# Patient Record
Sex: Female | Born: 1960
Health system: Southern US, Community
[De-identification: ages and names within clinical notes are randomized; demographics above are authoritative.]

## PROBLEM LIST (undated history)

## (undated) DIAGNOSIS — D649 Anemia, unspecified: Secondary | ICD-10-CM

## (undated) DIAGNOSIS — K760 Fatty (change of) liver, not elsewhere classified: Secondary | ICD-10-CM

## (undated) DIAGNOSIS — M2550 Pain in unspecified joint: Secondary | ICD-10-CM

## (undated) DIAGNOSIS — Z9221 Personal history of antineoplastic chemotherapy: Secondary | ICD-10-CM

## (undated) DIAGNOSIS — Z1389 Encounter for screening for other disorder: Secondary | ICD-10-CM

## (undated) DIAGNOSIS — E039 Hypothyroidism, unspecified: Secondary | ICD-10-CM

## (undated) DIAGNOSIS — Z923 Personal history of irradiation: Secondary | ICD-10-CM

## (undated) DIAGNOSIS — N63 Unspecified lump in unspecified breast: Secondary | ICD-10-CM

## (undated) DIAGNOSIS — I4891 Unspecified atrial fibrillation: Secondary | ICD-10-CM

## (undated) DIAGNOSIS — E041 Nontoxic single thyroid nodule: Secondary | ICD-10-CM

## (undated) DIAGNOSIS — K469 Unspecified abdominal hernia without obstruction or gangrene: Secondary | ICD-10-CM

## (undated) DIAGNOSIS — L02219 Cutaneous abscess of trunk, unspecified: Secondary | ICD-10-CM

## (undated) DIAGNOSIS — I10 Essential (primary) hypertension: Secondary | ICD-10-CM

## (undated) DIAGNOSIS — C50419 Malignant neoplasm of upper-outer quadrant of unspecified female breast: Secondary | ICD-10-CM

## (undated) DIAGNOSIS — I5189 Other ill-defined heart diseases: Secondary | ICD-10-CM

## (undated) DIAGNOSIS — C50919 Malignant neoplasm of unspecified site of unspecified female breast: Secondary | ICD-10-CM

## (undated) DIAGNOSIS — R600 Localized edema: Secondary | ICD-10-CM

## (undated) DIAGNOSIS — D4819 Other specified neoplasm of uncertain behavior of connective and other soft tissue: Secondary | ICD-10-CM

## (undated) DIAGNOSIS — R079 Chest pain, unspecified: Secondary | ICD-10-CM

## (undated) DIAGNOSIS — K219 Gastro-esophageal reflux disease without esophagitis: Secondary | ICD-10-CM

## (undated) DIAGNOSIS — M549 Dorsalgia, unspecified: Secondary | ICD-10-CM

## (undated) DIAGNOSIS — C44319 Basal cell carcinoma of skin of other parts of face: Secondary | ICD-10-CM

## (undated) DIAGNOSIS — Z1239 Encounter for other screening for malignant neoplasm of breast: Secondary | ICD-10-CM

## (undated) DIAGNOSIS — R0602 Shortness of breath: Secondary | ICD-10-CM

## (undated) DIAGNOSIS — R7303 Prediabetes: Secondary | ICD-10-CM

## (undated) DIAGNOSIS — Z853 Personal history of malignant neoplasm of breast: Secondary | ICD-10-CM

## (undated) DIAGNOSIS — R002 Palpitations: Secondary | ICD-10-CM

## (undated) DIAGNOSIS — L03319 Cellulitis of trunk, unspecified: Secondary | ICD-10-CM

## (undated) DIAGNOSIS — D481 Neoplasm of uncertain behavior of connective and other soft tissue: Secondary | ICD-10-CM

## (undated) DIAGNOSIS — E669 Obesity, unspecified: Secondary | ICD-10-CM

## (undated) DIAGNOSIS — G4730 Sleep apnea, unspecified: Secondary | ICD-10-CM

## (undated) HISTORY — DX: Prediabetes: R73.03

## (undated) HISTORY — DX: Pain in unspecified joint: M25.50

## (undated) HISTORY — DX: Malignant neoplasm of upper-outer quadrant of unspecified female breast: C50.419

## (undated) HISTORY — DX: Cellulitis of trunk, unspecified: L03.319

## (undated) HISTORY — DX: Palpitations: R00.2

## (undated) HISTORY — DX: Chest pain, unspecified: R07.9

## (undated) HISTORY — DX: Other specified neoplasm of uncertain behavior of connective and other soft tissue: D48.19

## (undated) HISTORY — DX: Unspecified abdominal hernia without obstruction or gangrene: K46.9

## (undated) HISTORY — DX: Shortness of breath: R06.02

## (undated) HISTORY — DX: Encounter for other screening for malignant neoplasm of breast: Z12.39

## (undated) HISTORY — DX: Unspecified lump in unspecified breast: N63.0

## (undated) HISTORY — DX: Obesity, unspecified: E66.9

## (undated) HISTORY — DX: Other ill-defined heart diseases: I51.89

## (undated) HISTORY — DX: Cutaneous abscess of trunk, unspecified: L02.219

## (undated) HISTORY — DX: Personal history of malignant neoplasm of breast: Z85.3

## (undated) HISTORY — DX: Encounter for screening for other disorder: Z13.89

## (undated) HISTORY — DX: Nontoxic single thyroid nodule: E04.1

## (undated) HISTORY — DX: Gastro-esophageal reflux disease without esophagitis: K21.9

## (undated) HISTORY — DX: Hypothyroidism, unspecified: E03.9

## (undated) HISTORY — DX: Personal history of antineoplastic chemotherapy: Z92.21

## (undated) HISTORY — DX: Neoplasm of uncertain behavior of connective and other soft tissue: D48.1

## (undated) HISTORY — DX: Basal cell carcinoma of skin of other parts of face: C44.319

## (undated) HISTORY — DX: Malignant neoplasm of unspecified site of unspecified female breast: C50.919

## (undated) HISTORY — PX: BREAST CYST ASPIRATION: SHX578

## (undated) HISTORY — DX: Unspecified atrial fibrillation: I48.91

## (undated) HISTORY — DX: Dorsalgia, unspecified: M54.9

## (undated) HISTORY — DX: Anemia, unspecified: D64.9

## (undated) HISTORY — DX: Localized edema: R60.0

## (undated) HISTORY — PX: CARDIAC CATHETERIZATION: SHX172

## (undated) HISTORY — DX: Essential (primary) hypertension: I10

## (undated) HISTORY — PX: THYROID SURGERY: SHX805

## (undated) HISTORY — DX: Sleep apnea, unspecified: G47.30

## (undated) HISTORY — DX: Fatty (change of) liver, not elsewhere classified: K76.0

## (undated) HISTORY — PX: TUBAL LIGATION: SHX77

---

## 2004-09-09 ENCOUNTER — Ambulatory Visit: Payer: Self-pay | Admitting: General Practice

## 2005-11-19 ENCOUNTER — Ambulatory Visit: Payer: Self-pay | Admitting: Family Medicine

## 2008-11-16 ENCOUNTER — Ambulatory Visit: Payer: Self-pay | Admitting: Unknown Physician Specialty

## 2008-11-22 ENCOUNTER — Ambulatory Visit: Payer: Self-pay | Admitting: Unknown Physician Specialty

## 2009-02-23 HISTORY — PX: ABDOMINAL HYSTERECTOMY: SHX81

## 2009-12-12 ENCOUNTER — Ambulatory Visit: Payer: Self-pay | Admitting: Unknown Physician Specialty

## 2009-12-30 ENCOUNTER — Ambulatory Visit: Payer: Self-pay | Admitting: Unknown Physician Specialty

## 2010-02-23 DIAGNOSIS — Z9221 Personal history of antineoplastic chemotherapy: Secondary | ICD-10-CM

## 2010-02-23 HISTORY — PX: BREAST LUMPECTOMY: SHX2

## 2010-02-23 HISTORY — DX: Personal history of antineoplastic chemotherapy: Z92.21

## 2010-07-02 ENCOUNTER — Ambulatory Visit: Payer: Self-pay | Admitting: General Surgery

## 2010-08-05 HISTORY — PX: BREAST EXCISIONAL BIOPSY: SUR124

## 2010-08-15 ENCOUNTER — Other Ambulatory Visit: Payer: Self-pay | Admitting: General Surgery

## 2010-08-15 DIAGNOSIS — C50912 Malignant neoplasm of unspecified site of left female breast: Secondary | ICD-10-CM

## 2010-08-19 ENCOUNTER — Other Ambulatory Visit: Payer: Self-pay | Admitting: General Surgery

## 2010-08-20 ENCOUNTER — Ambulatory Visit: Payer: Self-pay | Admitting: General Surgery

## 2010-08-23 ENCOUNTER — Ambulatory Visit
Admission: RE | Admit: 2010-08-23 | Discharge: 2010-08-23 | Disposition: A | Discharge status: Home / Self Care | Payer: 59 | Source: Ambulatory Visit | Attending: General Surgery | Admitting: General Surgery

## 2010-08-23 DIAGNOSIS — C50912 Malignant neoplasm of unspecified site of left female breast: Secondary | ICD-10-CM

## 2010-08-23 MED ORDER — GADOBENATE DIMEGLUMINE 529 MG/ML IV SOLN
20.0000 mL | Freq: Once | INTRAVENOUS | Status: AC | PRN
  Administered 2010-08-23: 20 mL via INTRAVENOUS

## 2010-08-24 ENCOUNTER — Ambulatory Visit: Payer: Self-pay | Admitting: Internal Medicine

## 2010-09-02 DIAGNOSIS — Z853 Personal history of malignant neoplasm of breast: Secondary | ICD-10-CM | POA: Insufficient documentation

## 2010-09-03 ENCOUNTER — Ambulatory Visit: Payer: Self-pay | Admitting: General Surgery

## 2010-09-08 LAB — PATHOLOGY REPORT

## 2010-09-10 ENCOUNTER — Ambulatory Visit: Payer: Self-pay | Admitting: Internal Medicine

## 2010-09-19 ENCOUNTER — Ambulatory Visit: Payer: Self-pay | Admitting: Internal Medicine

## 2010-09-22 ENCOUNTER — Ambulatory Visit: Payer: Self-pay | Admitting: General Surgery

## 2010-09-24 ENCOUNTER — Ambulatory Visit: Payer: Self-pay | Admitting: Internal Medicine

## 2010-10-25 ENCOUNTER — Ambulatory Visit: Payer: Self-pay | Admitting: Internal Medicine

## 2010-11-24 ENCOUNTER — Ambulatory Visit: Payer: Self-pay | Admitting: Internal Medicine

## 2010-12-08 ENCOUNTER — Encounter: Payer: Self-pay | Admitting: Internal Medicine

## 2010-12-25 ENCOUNTER — Ambulatory Visit: Payer: Self-pay | Admitting: Internal Medicine

## 2010-12-25 ENCOUNTER — Encounter: Payer: Self-pay | Admitting: Internal Medicine

## 2011-01-24 ENCOUNTER — Ambulatory Visit: Payer: Self-pay | Admitting: Internal Medicine

## 2011-01-24 ENCOUNTER — Encounter: Payer: Self-pay | Admitting: Internal Medicine

## 2011-02-23 ENCOUNTER — Ambulatory Visit: Payer: Self-pay | Admitting: General Surgery

## 2011-02-24 ENCOUNTER — Ambulatory Visit: Payer: Self-pay | Admitting: Internal Medicine

## 2011-02-24 DIAGNOSIS — Z923 Personal history of irradiation: Secondary | ICD-10-CM

## 2011-02-24 HISTORY — DX: Personal history of irradiation: Z92.3

## 2011-03-06 LAB — CBC CANCER CENTER
Basophil #: 0 x10 3/mm (ref 0.0–0.1)
Basophil %: 0.5 %
Eosinophil #: 0.2 x10 3/mm (ref 0.0–0.7)
Lymphocyte %: 17.9 %
MCH: 31.4 pg (ref 26.0–34.0)
MCHC: 34.2 g/dL (ref 32.0–36.0)
Monocyte #: 0.5 x10 3/mm (ref 0.0–0.7)
Monocyte %: 7.6 %
Neutrophil #: 5.1 x10 3/mm (ref 1.4–6.5)
Neutrophil %: 71.8 %
Platelet: 260 x10 3/mm (ref 150–440)
RDW: 15.4 % — ABNORMAL HIGH (ref 11.5–14.5)
WBC: 7.1 x10 3/mm (ref 3.6–11.0)

## 2011-03-13 LAB — CBC CANCER CENTER
Basophil #: 0 x10 3/mm (ref 0.0–0.1)
Eosinophil #: 0.1 x10 3/mm (ref 0.0–0.7)
HCT: 35.1 % (ref 35.0–47.0)
Lymphocyte #: 1.1 x10 3/mm (ref 1.0–3.6)
MCHC: 34.1 g/dL (ref 32.0–36.0)
MCV: 92 fL (ref 80–100)
Monocyte %: 7.9 %
Neutrophil #: 4.8 x10 3/mm (ref 1.4–6.5)
Platelet: 281 x10 3/mm (ref 150–440)
RDW: 14.7 % — ABNORMAL HIGH (ref 11.5–14.5)
WBC: 6.5 x10 3/mm (ref 3.6–11.0)

## 2011-03-27 ENCOUNTER — Ambulatory Visit: Payer: Self-pay | Admitting: Internal Medicine

## 2011-03-27 LAB — CBC CANCER CENTER
Basophil #: 0 x10 3/mm (ref 0.0–0.1)
Basophil %: 0.3 %
Eosinophil #: 0.1 x10 3/mm (ref 0.0–0.7)
Eosinophil %: 1.9 %
HGB: 11.6 g/dL — ABNORMAL LOW (ref 12.0–16.0)
Lymphocyte #: 0.7 x10 3/mm — ABNORMAL LOW (ref 1.0–3.6)
MCH: 31 pg (ref 26.0–34.0)
MCHC: 34.2 g/dL (ref 32.0–36.0)
Monocyte #: 0.5 x10 3/mm (ref 0.0–0.7)
Neutrophil %: 75.6 %
Platelet: 224 x10 3/mm (ref 150–440)

## 2011-04-05 ENCOUNTER — Emergency Department: Payer: Self-pay | Admitting: Emergency Medicine

## 2011-04-05 LAB — COMPREHENSIVE METABOLIC PANEL
Albumin: 3.3 g/dL — ABNORMAL LOW (ref 3.4–5.0)
Anion Gap: 10 (ref 7–16)
Bilirubin,Total: 0.6 mg/dL (ref 0.2–1.0)
Calcium, Total: 8.7 mg/dL (ref 8.5–10.1)
Glucose: 132 mg/dL — ABNORMAL HIGH (ref 65–99)
Osmolality: 278 (ref 275–301)
Potassium: 2.7 mmol/L — ABNORMAL LOW (ref 3.5–5.1)
SGPT (ALT): 29 U/L
Sodium: 138 mmol/L (ref 136–145)
Total Protein: 7.3 g/dL (ref 6.4–8.2)

## 2011-04-05 LAB — URINALYSIS, COMPLETE
Bilirubin,UR: NEGATIVE
Glucose,UR: NEGATIVE mg/dL (ref 0–75)
Leukocyte Esterase: NEGATIVE
Nitrite: NEGATIVE
Ph: 6 (ref 4.5–8.0)
RBC,UR: 8 /HPF (ref 0–5)

## 2011-04-05 LAB — CBC
MCH: 30.6 pg (ref 26.0–34.0)
MCV: 89 fL (ref 80–100)
Platelet: 91 10*3/uL — ABNORMAL LOW (ref 150–440)
RDW: 14 % (ref 11.5–14.5)
WBC: 9 10*3/uL (ref 3.6–11.0)

## 2011-04-06 LAB — URINE CULTURE

## 2011-04-10 LAB — CBC CANCER CENTER
Basophil #: 0 x10 3/mm (ref 0.0–0.1)
Basophil %: 0.3 %
Eosinophil #: 0 x10 3/mm (ref 0.0–0.7)
Eosinophil %: 0.8 %
HGB: 11 g/dL — ABNORMAL LOW (ref 12.0–16.0)
Lymphocyte #: 0.7 x10 3/mm — ABNORMAL LOW (ref 1.0–3.6)
MCH: 30 pg (ref 26.0–34.0)
MCHC: 34.2 g/dL (ref 32.0–36.0)
MCV: 88 fL (ref 80–100)
Monocyte #: 0.7 x10 3/mm (ref 0.0–0.7)
Neutrophil #: 4 x10 3/mm (ref 1.4–6.5)
Neutrophil %: 72.1 %
Platelet: 184 x10 3/mm (ref 150–440)

## 2011-04-17 LAB — CBC CANCER CENTER
Basophil #: 0 x10 3/mm (ref 0.0–0.1)
Basophil %: 0.3 %
Eosinophil #: 0.1 x10 3/mm (ref 0.0–0.7)
HCT: 31.8 % — ABNORMAL LOW (ref 35.0–47.0)
MCH: 30 pg (ref 26.0–34.0)
MCV: 88 fL (ref 80–100)
Monocyte #: 0.6 x10 3/mm (ref 0.0–0.7)
Monocyte %: 10.8 %
Platelet: 283 x10 3/mm (ref 150–440)
RDW: 15.1 % — ABNORMAL HIGH (ref 11.5–14.5)
WBC: 5.3 x10 3/mm (ref 3.6–11.0)

## 2011-04-24 ENCOUNTER — Ambulatory Visit: Payer: Self-pay | Admitting: Internal Medicine

## 2011-05-25 ENCOUNTER — Ambulatory Visit: Payer: Self-pay | Admitting: Internal Medicine

## 2011-05-29 LAB — CBC CANCER CENTER
Basophil #: 0 x10 3/mm (ref 0.0–0.1)
Basophil %: 0.4 %
Eosinophil #: 0.1 x10 3/mm (ref 0.0–0.7)
Eosinophil %: 1.4 %
HCT: 35.9 % (ref 35.0–47.0)
Lymphocyte %: 22.4 %
MCHC: 34.3 g/dL (ref 32.0–36.0)
MCV: 86 fL (ref 80–100)
Monocyte #: 0.4 x10 3/mm (ref 0.0–0.7)
Monocyte %: 7.2 %
Neutrophil #: 4.1 x10 3/mm (ref 1.4–6.5)
Neutrophil %: 68.6 %
RBC: 4.17 10*6/uL (ref 3.80–5.20)
RDW: 14.3 % (ref 11.5–14.5)

## 2011-05-29 LAB — COMPREHENSIVE METABOLIC PANEL
Alkaline Phosphatase: 89 U/L (ref 50–136)
Anion Gap: 11 (ref 7–16)
BUN: 15 mg/dL (ref 7–18)
Bilirubin,Total: 0.3 mg/dL (ref 0.2–1.0)
Calcium, Total: 9 mg/dL (ref 8.5–10.1)
Creatinine: 1.23 mg/dL (ref 0.60–1.30)
EGFR (Non-African Amer.): 49 — ABNORMAL LOW
Glucose: 126 mg/dL — ABNORMAL HIGH (ref 65–99)
Osmolality: 282 (ref 275–301)
Potassium: 3.3 mmol/L — ABNORMAL LOW (ref 3.5–5.1)
SGOT(AST): 30 U/L (ref 15–37)
SGPT (ALT): 57 U/L
Total Protein: 7.8 g/dL (ref 6.4–8.2)

## 2011-06-19 LAB — CBC CANCER CENTER
Basophil #: 0 x10 3/mm (ref 0.0–0.1)
Basophil %: 0.5 %
HGB: 11.7 g/dL — ABNORMAL LOW (ref 12.0–16.0)
Lymphocyte #: 1.4 x10 3/mm (ref 1.0–3.6)
Lymphocyte %: 22.2 %
MCH: 28.2 pg (ref 26.0–34.0)
MCHC: 32.8 g/dL (ref 32.0–36.0)
Monocyte #: 0.6 x10 3/mm (ref 0.2–0.9)
Neutrophil #: 4.3 x10 3/mm (ref 1.4–6.5)
Neutrophil %: 66.6 %

## 2011-06-24 ENCOUNTER — Ambulatory Visit: Payer: Self-pay | Admitting: Internal Medicine

## 2011-07-10 LAB — CBC CANCER CENTER
Basophil #: 0 x10 3/mm (ref 0.0–0.1)
Basophil %: 0.5 %
Eosinophil #: 0.1 x10 3/mm (ref 0.0–0.7)
HCT: 35.6 % (ref 35.0–47.0)
Lymphocyte #: 1.2 x10 3/mm (ref 1.0–3.6)
MCHC: 32.8 g/dL (ref 32.0–36.0)
MCV: 86 fL (ref 80–100)
Monocyte #: 0.5 x10 3/mm (ref 0.2–0.9)
Monocyte %: 7.1 %
Neutrophil #: 4.8 x10 3/mm (ref 1.4–6.5)
Platelet: 225 x10 3/mm (ref 150–440)
WBC: 6.7 x10 3/mm (ref 3.6–11.0)

## 2011-07-25 ENCOUNTER — Ambulatory Visit: Payer: Self-pay | Admitting: Internal Medicine

## 2011-07-31 LAB — CBC CANCER CENTER
Basophil #: 0 10*3/uL
Basophil %: 0.6 %
Eosinophil #: 0.2 10*3/uL
Eosinophil %: 2.4 %
HCT: 35.9 %
HGB: 11.7 g/dL — ABNORMAL LOW
Lymphocyte %: 24.4 %
Lymphs Abs: 1.8 10*3/uL
MCH: 28 pg
MCHC: 32.6 g/dL
MCV: 86 fL
Monocyte #: 0.5 10*3/uL
Monocyte %: 7.6 %
Neutrophil #: 4.7 10*3/uL
Neutrophil %: 65 %
Platelet: 241 10*3/uL
RBC: 4.18 10*6/uL
RDW: 14.1 %
WBC: 7.3 10*3/uL

## 2011-07-31 LAB — COMPREHENSIVE METABOLIC PANEL WITH GFR
Albumin: 3.6 g/dL
Alkaline Phosphatase: 102 U/L
Anion Gap: 8
BUN: 16 mg/dL
Bilirubin,Total: 0.2 mg/dL
Calcium, Total: 9.1 mg/dL
Chloride: 103 mmol/L
Co2: 29 mmol/L
Creatinine: 1.04 mg/dL
EGFR (African American): >60
EGFR (Non-African Amer.): >60
Glucose: 86 mg/dL
Osmolality: 280
Potassium: 3.6 mmol/L
SGOT(AST): 16 U/L
SGPT (ALT): 22 U/L
Sodium: 140 mmol/L
Total Protein: 7.6 g/dL

## 2011-08-21 LAB — CBC CANCER CENTER
Basophil %: 0.6 %
Eosinophil #: 0.2 x10 3/mm (ref 0.0–0.7)
Eosinophil %: 2.3 %
Lymphocyte #: 1.6 x10 3/mm (ref 1.0–3.6)
MCH: 28.1 pg (ref 26.0–34.0)
MCHC: 32.7 g/dL (ref 32.0–36.0)
Monocyte #: 0.4 x10 3/mm (ref 0.2–0.9)
Neutrophil #: 4.9 x10 3/mm (ref 1.4–6.5)
Neutrophil %: 68.6 %
Platelet: 235 x10 3/mm (ref 150–440)
RBC: 4.19 10*6/uL (ref 3.80–5.20)
RDW: 13.9 % (ref 11.5–14.5)
WBC: 7.1 x10 3/mm (ref 3.6–11.0)

## 2011-08-24 ENCOUNTER — Ambulatory Visit: Payer: Self-pay | Admitting: Internal Medicine

## 2011-09-08 ENCOUNTER — Ambulatory Visit: Payer: Self-pay | Admitting: General Surgery

## 2011-09-11 LAB — CBC CANCER CENTER
Basophil #: 0 x10 3/mm (ref 0.0–0.1)
Eosinophil #: 0.1 x10 3/mm (ref 0.0–0.7)
HCT: 36.8 % (ref 35.0–47.0)
Lymphocyte #: 1.9 x10 3/mm (ref 1.0–3.6)
MCV: 87 fL (ref 80–100)
Monocyte #: 0.5 x10 3/mm (ref 0.2–0.9)
Monocyte %: 7.2 %
Neutrophil %: 65.6 %
Platelet: 226 x10 3/mm (ref 150–440)
RDW: 14.3 % (ref 11.5–14.5)
WBC: 7.4 x10 3/mm (ref 3.6–11.0)

## 2011-09-24 ENCOUNTER — Ambulatory Visit: Payer: Self-pay | Admitting: Internal Medicine

## 2011-10-02 LAB — CBC CANCER CENTER
Basophil #: 0 x10 3/mm (ref 0.0–0.1)
Basophil %: 0.5 %
Eosinophil #: 0.1 x10 3/mm (ref 0.0–0.7)
HGB: 12.3 g/dL (ref 12.0–16.0)
Lymphocyte %: 27.5 %
MCH: 29 pg (ref 26.0–34.0)
MCV: 86 fL (ref 80–100)
Monocyte #: 0.3 x10 3/mm (ref 0.2–0.9)
Neutrophil #: 4.7 x10 3/mm (ref 1.4–6.5)
Platelet: 247 x10 3/mm (ref 150–440)
RBC: 4.24 10*6/uL (ref 3.80–5.20)

## 2011-10-23 ENCOUNTER — Ambulatory Visit: Payer: Self-pay | Admitting: General Surgery

## 2011-10-23 LAB — CBC CANCER CENTER
Basophil %: 0.4 %
Eosinophil #: 0.1 x10 3/mm (ref 0.0–0.7)
Eosinophil %: 1.7 %
HCT: 34.7 % — ABNORMAL LOW (ref 35.0–47.0)
HGB: 11.3 g/dL — ABNORMAL LOW (ref 12.0–16.0)
Lymphocyte %: 27.5 %
MCH: 28 pg (ref 26.0–34.0)
MCHC: 32.5 g/dL (ref 32.0–36.0)
Monocyte #: 0.6 x10 3/mm (ref 0.2–0.9)
Monocyte %: 7.6 %
Neutrophil #: 4.6 x10 3/mm (ref 1.4–6.5)
Neutrophil %: 62.8 %
RBC: 4.03 10*6/uL (ref 3.80–5.20)

## 2011-10-23 LAB — COMPREHENSIVE METABOLIC PANEL
Albumin: 3.5 g/dL (ref 3.4–5.0)
BUN: 18 mg/dL (ref 7–18)
Calcium, Total: 8.9 mg/dL (ref 8.5–10.1)
Chloride: 105 mmol/L (ref 98–107)
Co2: 30 mmol/L (ref 21–32)
EGFR (African American): >60
EGFR (Non-African Amer.): 56 — ABNORMAL LOW
SGOT(AST): 22 U/L (ref 15–37)
SGPT (ALT): 23 U/L (ref 12–78)
Sodium: 143 mmol/L (ref 136–145)

## 2011-10-25 ENCOUNTER — Ambulatory Visit: Payer: Self-pay | Admitting: Internal Medicine

## 2011-11-13 LAB — CBC CANCER CENTER
Basophil %: 0.8 %
Eosinophil %: 1 %
HCT: 36.7 % (ref 35.0–47.0)
HGB: 12.1 g/dL (ref 12.0–16.0)
Lymphocyte #: 2.1 x10 3/mm (ref 1.0–3.6)
MCH: 28.4 pg (ref 26.0–34.0)
MCHC: 33 g/dL (ref 32.0–36.0)
Monocyte %: 6.9 %
Neutrophil #: 4.6 x10 3/mm (ref 1.4–6.5)
Neutrophil %: 62.5 %
WBC: 7.4 x10 3/mm (ref 3.6–11.0)

## 2011-11-24 ENCOUNTER — Ambulatory Visit: Payer: Self-pay | Admitting: Internal Medicine

## 2011-12-11 LAB — CREATININE, SERUM
Creatinine: 1.09 mg/dL (ref 0.60–1.30)
EGFR (African American): >60
EGFR (Non-African Amer.): 60 — ABNORMAL LOW

## 2011-12-11 LAB — CBC CANCER CENTER
Basophil %: 0.7 %
Eosinophil #: 0.1 x10 3/mm (ref 0.0–0.7)
Eosinophil %: 1.1 %
HCT: 38.2 % (ref 35.0–47.0)
HGB: 12.2 g/dL (ref 12.0–16.0)
Lymphocyte #: 2.3 x10 3/mm (ref 1.0–3.6)
Lymphocyte %: 30.7 %
MCHC: 32 g/dL (ref 32.0–36.0)
MCV: 87 fL (ref 80–100)
Monocyte %: 6.7 %
Neutrophil #: 4.5 x10 3/mm (ref 1.4–6.5)
Neutrophil %: 60.8 %
RBC: 4.4 10*6/uL (ref 3.80–5.20)
RDW: 14.2 % (ref 11.5–14.5)

## 2011-12-11 LAB — HEPATIC FUNCTION PANEL A (ARMC)
Bilirubin, Direct: <0.05 mg/dL (ref 0.00–0.20)
SGOT(AST): 18 U/L (ref 15–37)

## 2011-12-25 ENCOUNTER — Ambulatory Visit: Payer: Self-pay | Admitting: Internal Medicine

## 2012-01-29 ENCOUNTER — Ambulatory Visit: Payer: Self-pay | Admitting: Internal Medicine

## 2012-02-22 ENCOUNTER — Emergency Department: Payer: Self-pay | Admitting: Emergency Medicine

## 2012-02-22 LAB — CBC
HCT: 35.4 % (ref 35.0–47.0)
MCH: 30 pg (ref 26.0–34.0)
MCV: 86 fL (ref 80–100)
RBC: 4.1 10*6/uL (ref 3.80–5.20)
RDW: 14.5 % (ref 11.5–14.5)
WBC: 9 10*3/uL (ref 3.6–11.0)

## 2012-02-22 LAB — HEPATIC FUNCTION PANEL A (ARMC)
Albumin: 3.8 g/dL (ref 3.4–5.0)
Bilirubin, Direct: 0.1 mg/dL (ref 0.00–0.20)
SGOT(AST): 26 U/L (ref 15–37)
Total Protein: 7.9 g/dL (ref 6.4–8.2)

## 2012-02-22 LAB — BASIC METABOLIC PANEL
Anion Gap: 9 (ref 7–16)
BUN: 13 mg/dL (ref 7–18)
Chloride: 101 mmol/L (ref 98–107)
Co2: 27 mmol/L (ref 21–32)
Creatinine: 1.15 mg/dL (ref 0.60–1.30)
EGFR (African American): >60
Glucose: 102 mg/dL — ABNORMAL HIGH (ref 65–99)
Osmolality: 274 (ref 275–301)

## 2012-02-22 LAB — CK TOTAL AND CKMB (NOT AT ARMC)
CK, Total: 236 U/L — ABNORMAL HIGH (ref 21–215)
CK-MB: 0.6 ng/mL (ref 0.5–3.6)

## 2012-02-22 LAB — MAGNESIUM: Magnesium: 1.9 mg/dL

## 2012-02-22 LAB — LIPASE, BLOOD: Lipase: 203 U/L (ref 73–393)

## 2012-02-24 ENCOUNTER — Ambulatory Visit: Payer: Self-pay | Admitting: Internal Medicine

## 2012-03-14 LAB — CBC CANCER CENTER
Basophil #: 0.1 x10 3/mm (ref 0.0–0.1)
Basophil %: 0.6 %
Eosinophil #: 0.1 x10 3/mm (ref 0.0–0.7)
Eosinophil %: 1.7 %
HCT: 38 % (ref 35.0–47.0)
HGB: 12.8 g/dL (ref 12.0–16.0)
Lymphocyte #: 2.8 x10 3/mm (ref 1.0–3.6)
Lymphocyte %: 31.2 %
Monocyte #: 0.4 x10 3/mm (ref 0.2–0.9)
Neutrophil #: 5.4 x10 3/mm (ref 1.4–6.5)
Neutrophil %: 61.5 %
RDW: 14.5 % (ref 11.5–14.5)

## 2012-03-14 LAB — HEPATIC FUNCTION PANEL A (ARMC)
Albumin: 3.7 g/dL (ref 3.4–5.0)
Alkaline Phosphatase: 99 U/L (ref 50–136)
Bilirubin, Direct: <0.05 mg/dL (ref 0.00–0.20)
SGOT(AST): 18 U/L (ref 15–37)
Total Protein: 8.2 g/dL (ref 6.4–8.2)

## 2012-03-14 LAB — CREATININE, SERUM
Creatinine: 1.16 mg/dL (ref 0.60–1.30)
EGFR (Non-African Amer.): 55 — ABNORMAL LOW

## 2012-03-14 LAB — MAGNESIUM: Magnesium: 1.8 mg/dL

## 2012-03-26 ENCOUNTER — Ambulatory Visit: Payer: Self-pay | Admitting: Internal Medicine

## 2012-04-01 ENCOUNTER — Encounter: Payer: Self-pay | Admitting: General Surgery

## 2012-05-05 ENCOUNTER — Ambulatory Visit: Payer: Self-pay | Admitting: General Surgery

## 2012-05-05 ENCOUNTER — Ambulatory Visit: Payer: Self-pay | Admitting: Internal Medicine

## 2012-05-18 ENCOUNTER — Telehealth: Payer: Self-pay | Admitting: *Deleted

## 2012-05-18 DIAGNOSIS — Z853 Personal history of malignant neoplasm of breast: Secondary | ICD-10-CM

## 2012-05-18 NOTE — Telephone Encounter (Signed)
Patient has been scheduled per her request for a unilateral right breast diagnostic mammogram at [facility] for 05-23-12 at 9 am. She is aware of date, time, and instructions.

## 2012-05-18 NOTE — Telephone Encounter (Addendum)
Patient just had unilateral left breast diagnostic mammogram. She is very nervous and is wanting to know if you would also order a mammogram on the right breast even though mammogram on 10-23-11 at [facility] showed no change. Patient has an appointment for follow up on 06-01-12. She is requesting that right mammogram be completed prior to this appointment so you can have the results at time of follow up. Please advise.    Arrange for a right diagnostic mammogram prior to 4/9 appt. Thanks.

## 2012-05-23 ENCOUNTER — Encounter: Payer: Self-pay | Admitting: General Surgery

## 2012-05-23 ENCOUNTER — Ambulatory Visit: Payer: Self-pay | Admitting: General Surgery

## 2012-05-24 ENCOUNTER — Ambulatory Visit: Payer: Self-pay | Admitting: Internal Medicine

## 2012-06-01 ENCOUNTER — Ambulatory Visit (INDEPENDENT_AMBULATORY_CARE_PROVIDER_SITE_OTHER): Payer: 59 | Admitting: General Surgery

## 2012-06-01 ENCOUNTER — Encounter: Payer: Self-pay | Admitting: General Surgery

## 2012-06-01 VITALS — BP 124/76 | HR 76 | Resp 14 | Ht 67.0 in | Wt 224.0 lb

## 2012-06-01 DIAGNOSIS — Z853 Personal history of malignant neoplasm of breast: Secondary | ICD-10-CM

## 2012-06-01 DIAGNOSIS — Z1211 Encounter for screening for malignant neoplasm of colon: Secondary | ICD-10-CM

## 2012-06-01 DIAGNOSIS — C50919 Malignant neoplasm of unspecified site of unspecified female breast: Secondary | ICD-10-CM

## 2012-06-01 DIAGNOSIS — C50912 Malignant neoplasm of unspecified site of left female breast: Secondary | ICD-10-CM

## 2012-06-01 MED ORDER — POLYETHYLENE GLYCOL 3350 17 GM/SCOOP PO POWD: ORAL | Status: DC

## 2012-06-01 NOTE — Patient Instructions (Addendum)
Patient advised to have left breast mammogram in 6 months.   Patient advised to have screening colonoscopy done. This patient will be contacted once June 2014 schedule is available. Miralax prescription has already been sent to pharmacy.

## 2012-06-01 NOTE — Progress Notes (Signed)
Patient ID: Heather Dudley, female   DOB: 12-24-1960, 52 y.o.   MRN: [id number]  Chief Complaint  Patient presents with  . Follow-up    mammogram    HPI Heather Dudley is a 52 y.o. female who presents for a follow up mammogram. Most recent mammogram was done at [facility] on 05/23/12 with birad category 2. The patient has a history of left breast cancer in 2012. She underwent a left lumpectomy, chemotherapy as well as radiation therapy in 2012. The patient states no problems at this time. No lumps,bumps,pain, or tenderness. She has has a bump to come up within the last 2 months on her left hip. She is not having any current signs or symptoms at this time.  HPI  Past Medical History  Diagnosis Date  . Cellulitis and abscess of trunk   . Hypertension   . 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 Thyroid nodule   . Hernia   . Personal history of malignant neoplasm of breast     The patient underwent wide excision, mastoplasty. Dissection for a T1b, N1, M0 carcinoma left breast on September 03, 2010.The primary tumor was 1 cm in diameter, and a single positive axillary node 1.3 cm in diameter. This was an ER-positive, PR slightly positive, HER-2/neu not overexpressing tumor.  The patient completed whole breast radiation in February 2013.  . Breast screening, unspecified   . Lump or mass in breast   . Obesity, unspecified   . History of chemotherapy 2012  . Screening for obesity   . Malignant neoplasm of breast (female), unspecified site     She underwent wide excision, mastoplasty and axillary dissection for her left breast malignancy on September 03, 2010. The primary tumor was 1 cm in diameter, and a single positive axillary node 1.3 cm in diameter. This was an ER-positive, PR slightly positive, HER-2/neu not overexpressing tumor. She tolerated her whole breast radiation without difficulty ending in late February 2013.  [name] Basal cell carcinoma of forehead     birthmark of  forehead  . Malignant neoplasm of upper-outer quadrant of female breast     Past Surgical History  Procedure Laterality Date  . Tubal ligation    . Tonsillectomy    . Birth mark removed    . Breast lumpectomy  2012    left breast  . Breast Dudley  August 05, 2010    left breat Heather Dudley  . Abdominal hysterectomy  2011  . Thyroid surgery      partially removed    Family History  Problem Relation Age of Onset  . Breast cancer Other   . Colon cancer Other   . Ovarian cancer Other     Social History History  Substance Use Topics  . Smoking status: Never Smoker   . Smokeless tobacco: Not on file  . Alcohol Use: No    Allergies  Allergen Reactions  . Penicillins Rash    Current Outpatient Prescriptions  Medication Sig Dispense Refill  . anastrozole (ARIMIDEX) 1 MG tablet Take 1 mg by mouth daily.      . Calcium Carbonate-Vitamin D (CALCIUM PLUS VITAMIN D PO) Take 2 tablets by mouth daily.      . Ibuprofen (ADVIL PO) Take by mouth.      [name] lisinopril-hydrochlorothiazide (PRINZIDE,ZESTORETIC) 10-12.5 MG per tablet Take 1 tablet by mouth daily.      . Multiple Vitamins-Minerals (MULTIVITAMIN PO) Take by mouth daily.      [name]  venlafaxine (EFFEXOR) 75 MG tablet Take 1 tablet by mouth daily.      . polyethylene glycol powder (GLYCOLAX/MIRALAX) powder 255 grams one bottle for colonoscopy prep  255 g  0   No current facility-administered medications for this visit.    Review of Systems Review of Systems  Constitutional: Negative.   Respiratory: Negative.   Cardiovascular: Negative.     Blood pressure 124/76, pulse 76, resp. rate 14, height 5\' 7"  (1.702 m), weight 224 lb (101.606 kg), last menstrual period 04/01/2008.  Physical Exam Physical Exam  Constitutional: She appears well-developed and well-nourished.  Neck: Trachea normal. No mass and no thyromegaly present.  Cardiovascular: Normal rate, regular rhythm, normal heart sounds and normal pulses.   No murmur  heard. Pulmonary/Chest: Effort normal and breath sounds normal. Right breast exhibits no inverted nipple, no mass, no nipple discharge, no skin change and no tenderness. Left breast exhibits skin change. Left breast exhibits no inverted nipple, no mass, no nipple discharge and no tenderness.  Little thickening in the right axillary tail. Port site well.    Well healed scar at 3 o'clock. Little thickening mid portion 3 o'clock.  Increased pigmentation.  Lymphadenopathy:    She has no cervical adenopathy.    She has no axillary adenopathy.   Examination of the upper extremities showed no visual asymmetry. Measurements of the upper extremities were completed a location 15 cm superior the olecranon process as well as 10 and 20 cm below the olecranon process.  On the right side measurements were 42, 34 and 23 cm respectively.  On the left side measurements were 42, 33 and 23 cm respectively.  No evidence of significant lymphedema.   Data Reviewed Right breast mammogram dated 05/23/2012 showed scattered fibroglandular densities. No dominant mass.BIRAD-2.  Left breast mammogram dated 05/05/2012 completed 21 months after wide excision and subsequent adjuvant chemotherapy and radiation showed a heterogeneously dense pattern. Skin thickening is noted inferiorly. No dominant mass. No new areas of architectural distortion. BIRAD-3.  Assessment    Doing well now almost 2 years status post treatment of her left breast cancer.  At age for screening colonoscopy.    Plan      The pros and cons of screening colonoscopy were discussed with the patient.  In regards to her breass we'll plans for a followup left breast mammogram and office visit in 6 months.       [name] 06/02/2012, 4:13 PM

## 2012-06-02 ENCOUNTER — Encounter: Payer: Self-pay | Admitting: General Surgery

## 2012-06-08 ENCOUNTER — Telehealth: Payer: Self-pay | Admitting: *Deleted

## 2012-06-08 NOTE — Telephone Encounter (Signed)
Patient was contacted today to arrange colonoscopy. This has been scheduled at [facility] for 08-03-12. She already has prescription for Miralax prep. Patient was instructed to pre-register no later than two days prior to procedure. She will be contacted prior to colonoscopy to verify no medication changes.

## 2012-07-22 ENCOUNTER — Telehealth: Payer: Self-pay | Admitting: *Deleted

## 2012-07-22 NOTE — Telephone Encounter (Signed)
Patient reports no medication changes since last office visit. This patient also reports she has not pre-registered but has been instructed to do so prior to Friday, 07-29-12. We will proceed with colonoscopy that is scheduled for 08-03-12 at [facility]. She will call if she has further questions.

## 2012-07-29 HISTORY — PX: COLONOSCOPY: SHX174

## 2012-07-29 LAB — HM COLONOSCOPY

## 2012-08-01 ENCOUNTER — Other Ambulatory Visit: Payer: Self-pay | Admitting: General Surgery

## 2012-08-01 DIAGNOSIS — Z1211 Encounter for screening for malignant neoplasm of colon: Secondary | ICD-10-CM

## 2012-08-02 ENCOUNTER — Ambulatory Visit: Payer: Self-pay | Admitting: Internal Medicine

## 2012-08-03 ENCOUNTER — Ambulatory Visit: Payer: Self-pay | Admitting: General Surgery

## 2012-08-03 DIAGNOSIS — Z1211 Encounter for screening for malignant neoplasm of colon: Secondary | ICD-10-CM

## 2012-09-12 ENCOUNTER — Ambulatory Visit: Payer: Self-pay | Admitting: Internal Medicine

## 2012-09-19 LAB — CBC CANCER CENTER
Basophil #: 0 x10 3/mm (ref 0.0–0.1)
HCT: 37.3 % (ref 35.0–47.0)
HGB: 12.7 g/dL (ref 12.0–16.0)
MCV: 87 fL (ref 80–100)
Neutrophil %: 61.4 %
RDW: 14.2 % (ref 11.5–14.5)

## 2012-09-19 LAB — CREATININE, SERUM
Creatinine: 1.22 mg/dL (ref 0.60–1.30)
EGFR (Non-African Amer.): 52 — ABNORMAL LOW

## 2012-09-19 LAB — MAGNESIUM: Magnesium: 2.1 mg/dL

## 2012-09-19 LAB — HEPATIC FUNCTION PANEL A (ARMC)
SGOT(AST): 16 U/L (ref 15–37)
SGPT (ALT): 19 U/L (ref 12–78)

## 2012-09-20 LAB — CANCER ANTIGEN 27.29: CA 27.29: 20.7 U/mL (ref 0.0–38.6)

## 2012-09-23 ENCOUNTER — Ambulatory Visit: Payer: Self-pay | Admitting: Internal Medicine

## 2012-10-26 ENCOUNTER — Ambulatory Visit: Payer: Self-pay | Admitting: Radiation Oncology

## 2012-11-08 ENCOUNTER — Ambulatory Visit: Payer: Self-pay | Admitting: Internal Medicine

## 2012-11-23 ENCOUNTER — Ambulatory Visit: Payer: Self-pay | Admitting: Radiation Oncology

## 2012-12-22 ENCOUNTER — Ambulatory Visit: Payer: 59 | Admitting: General Surgery

## 2012-12-24 ENCOUNTER — Ambulatory Visit: Payer: Self-pay | Admitting: Radiation Oncology

## 2012-12-26 ENCOUNTER — Ambulatory Visit (INDEPENDENT_AMBULATORY_CARE_PROVIDER_SITE_OTHER): Payer: 59 | Admitting: General Surgery

## 2012-12-26 ENCOUNTER — Encounter: Payer: Self-pay | Admitting: General Surgery

## 2012-12-26 VITALS — BP 140/90 | HR 80 | Resp 14 | Ht 66.0 in | Wt 233.0 lb

## 2012-12-26 DIAGNOSIS — C50919 Malignant neoplasm of unspecified site of unspecified female breast: Secondary | ICD-10-CM

## 2012-12-26 DIAGNOSIS — C50912 Malignant neoplasm of unspecified site of left female breast: Secondary | ICD-10-CM

## 2012-12-26 NOTE — Progress Notes (Signed)
Patient ID: Heather Dudley, female   DOB: 09/22/1960, 52 y.o.   MRN: [id number]  Chief Complaint  Patient presents with  . Follow-up    6 month left diagnostic mammogram     HPI Heather Dudley is a 52 y.o. female who presents for a breast evaluation. The most recent mammogram was done on 11/08/12 with a birad category 2. Patient does perform regular self breast checks and gets regular mammograms done. The patient states within the last 2 months she has a new onset of left breast cramping. The sensation comes and goes. She states it happens 3-4 times a day. It lasts a couple of minutes at a time. This typically comes when she is lying down. She is not appreciated that it is related to activity. No associated symptoms.  The patient reports some low back pain, with no clear pattern yet identified related to activity. She has required epidural injections in the past.  Otherwise doing well.   The patient's weight is up 9 pounds from her April 2014 exam.   HPI  Past Medical History  Diagnosis Date  . Cellulitis and abscess of trunk   . Hypertension   . 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 Thyroid nodule   . Hernia   . Personal history of malignant neoplasm of breast     The patient underwent wide excision, mastoplasty. Dissection for a T1b, N1, M0 carcinoma left breast on September 03, 2010.The primary tumor was 1 cm in diameter, and a single positive axillary node 1.3 cm in diameter. This was an ER-positive, PR slightly positive, HER-2/neu not overexpressing tumor.  The patient completed whole breast radiation in February 2013.  . Breast screening, unspecified   . Lump or mass in breast   . Obesity, unspecified   . History of chemotherapy 2012  . Screening for obesity   . Malignant neoplasm of breast (female), unspecified site     She underwent wide excision, mastoplasty and axillary dissection for her left breast malignancy on September 03, 2010. The  primary tumor was 1 cm in diameter, and a single positive axillary node 1.3 cm in diameter. This was an ER-positive, PR slightly positive, HER-2/neu not overexpressing tumor. She tolerated her whole breast radiation without difficulty ending in late February 2013.  Heather Dudley Basal cell carcinoma of forehead     birthmark of forehead  . Malignant neoplasm of upper-outer quadrant of female breast     Past Surgical History  Procedure Laterality Date  . Tubal ligation    . Tonsillectomy    . Birth mark removed    . Breast lumpectomy  2012    left breast  . Breast biopsy  August 05, 2010    left breat [facility] biopsy  . Abdominal hysterectomy  2011  . Thyroid surgery      partially removed  . Colonoscopy  July 29, 2012    normal study.    Family History  Problem Relation Age of Onset  . Breast cancer Other   . Colon cancer Other   . Ovarian cancer Other     Social History History  Substance Use Topics  . Smoking status: Never Smoker   . Smokeless tobacco: Not on file  . Alcohol Use: No    Allergies  Allergen Reactions  . Penicillins Rash    Current Outpatient Prescriptions  Medication Sig Dispense Refill  . anastrozole (ARIMIDEX) 1 MG tablet Take 1 mg by mouth daily.      Heather Dudley  Calcium Carbonate-Vitamin D (CALCIUM PLUS VITAMIN D PO) Take 2 tablets by mouth daily.      . Ibuprofen (ADVIL PO) Take by mouth.      Heather Dudley lisinopril-hydrochlorothiazide (PRINZIDE,ZESTORETIC) 10-12.5 MG per tablet Take 1 tablet by mouth daily.      . Multiple Vitamins-Minerals (MULTIVITAMIN PO) Take by mouth daily.      Heather Dudley venlafaxine (EFFEXOR) 75 MG tablet Take 1 tablet by mouth daily.       No current facility-administered medications for this visit.    Review of Systems Review of Systems  Constitutional: Negative.   Respiratory: Negative.   Cardiovascular: Negative.     Blood pressure 140/90, pulse 80, resp. rate 14, height 5\' 6"  (1.676 m), weight 233 lb (105.688 kg), last menstrual period  04/01/2008.  Physical Exam Physical Exam  Constitutional: She is oriented to person, place, and time. She appears well-developed and well-nourished.  Neck: No thyromegaly present.  Cardiovascular: Normal rate, regular rhythm and normal heart sounds.   No murmur heard. Pulmonary/Chest: Effort normal and breath sounds normal. Right breast exhibits no inverted nipple, no mass, no nipple discharge, no skin change and no tenderness. Left breast exhibits no inverted nipple, no mass, no nipple discharge, no skin change and no tenderness.  Little darkening of the skin over the left breast. Mild thickening over the incision scar at 3 o'clock.     Lymphadenopathy:    She has no cervical adenopathy.    She has no axillary adenopathy.  Neurological: She is alert and oriented to person, place, and time.  Skin: Skin is warm and dry.  Examination of the back showed no focal tenderness. No muscle spasm. No point tenderness.  Data Reviewed Left breast mammogram dated November 08, 2012 showed posttreatment changes. Scattered calcifications.  BI-RAD-2.  Assessment    Normal exam now 2 years status post treatment of her breast cancer.  Chest wall/back pain unrelated to previous cancer. Chest wall discomfort may be scarring in the underlying pectoralis muscle.     Plan    The patient is due for bilateral mammograms in 6 months. We'll plan for a clinical exam in one year.        Heather Dudley 12/26/2012, 9:39 PM

## 2012-12-26 NOTE — Patient Instructions (Addendum)
Patient to return in 1 year with bilateral diagnostic mammogram.  

## 2013-01-02 ENCOUNTER — Encounter: Payer: Self-pay | Admitting: General Surgery

## 2013-01-03 ENCOUNTER — Encounter: Payer: Self-pay | Admitting: General Surgery

## 2013-01-23 ENCOUNTER — Ambulatory Visit: Payer: Self-pay | Admitting: Radiation Oncology

## 2013-02-14 ENCOUNTER — Ambulatory Visit (INDEPENDENT_AMBULATORY_CARE_PROVIDER_SITE_OTHER): Payer: 59 | Admitting: General Surgery

## 2013-02-14 ENCOUNTER — Encounter: Payer: Self-pay | Admitting: General Surgery

## 2013-02-14 VITALS — BP 166/86 | HR 92 | Resp 14 | Ht 67.0 in | Wt 233.0 lb

## 2013-02-14 DIAGNOSIS — C50912 Malignant neoplasm of unspecified site of left female breast: Secondary | ICD-10-CM

## 2013-02-14 DIAGNOSIS — C50919 Malignant neoplasm of unspecified site of unspecified female breast: Secondary | ICD-10-CM

## 2013-02-14 NOTE — Patient Instructions (Signed)
Patient to return in 1 week for nurse visit.  

## 2013-02-14 NOTE — Progress Notes (Signed)
Patient ID: Heather Dudley, female   DOB: [birth year], 52 y.o.   MRN: [id number]  Chief Complaint  Patient presents with  . Procedure    port removal    HPI Heather Dudley is a 52 y.o. female who presents for a port removal. Confirmation of port removal was obtained from medical oncology.  HPI  Past Medical History  Diagnosis Date  . Cellulitis and abscess of trunk   . Hypertension   . 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 Thyroid nodule   . Hernia   . Personal history of malignant neoplasm of breast     The patient underwent wide excision, mastoplasty. Dissection for a T1b, N1, M0 carcinoma left breast on September 03, 2010.The primary tumor was 1 cm in diameter, and a single positive axillary node 1.3 cm in diameter. This was an ER-positive, PR slightly positive, HER-2/neu not overexpressing tumor.  The patient completed whole breast radiation in February 2013.  . Breast screening, unspecified   . Lump or mass in breast   . Obesity, unspecified   . History of chemotherapy 2012  . Screening for obesity   . Malignant neoplasm of breast (female), unspecified site     She underwent wide excision, mastoplasty and axillary dissection for her left breast malignancy on September 03, 2010. The primary tumor was 1 cm in diameter, and a single positive axillary node 1.3 cm in diameter. This was an ER-positive, PR slightly positive, HER-2/neu not overexpressing tumor. She tolerated her whole breast radiation without difficulty ending in late February 2013.  [name] Basal cell carcinoma of forehead     birthmark of forehead  . Malignant neoplasm of upper-outer quadrant of female breast     Past Surgical History  Procedure Laterality Date  . Tubal ligation    . Tonsillectomy    . Birth mark removed    . Breast lumpectomy  2012    left breast  . Breast biopsy  August 05, 2010    left breat [facility] biopsy  . Abdominal hysterectomy  2011  . Thyroid surgery       partially removed  . Colonoscopy  July 29, 2012    normal study.    Family History  Problem Relation Age of Onset  . Breast cancer Other   . Colon cancer Other   . Ovarian cancer Other     Social History History  Substance Use Topics  . Smoking status: Never Smoker   . Smokeless tobacco: Not on file  . Alcohol Use: No    Allergies  Allergen Reactions  . Penicillins Rash    Current Outpatient Prescriptions  Medication Sig Dispense Refill  . anastrozole (ARIMIDEX) 1 MG tablet Take 1 mg by mouth daily.      . Calcium Carbonate-Vitamin D (CALCIUM PLUS VITAMIN D PO) Take 2 tablets by mouth daily.      . Ibuprofen (ADVIL PO) Take by mouth.      [name] lisinopril-hydrochlorothiazide (PRINZIDE,ZESTORETIC) 10-12.5 MG per tablet Take 1 tablet by mouth daily.      . Multiple Vitamins-Minerals (MULTIVITAMIN PO) Take by mouth daily.      [name] venlafaxine (EFFEXOR) 75 MG tablet Take 1 tablet by mouth daily.       No current facility-administered medications for this visit.    Review of Systems Review of Systems  Constitutional: Negative.   Respiratory: Negative.   Cardiovascular: Negative.     Blood pressure 166/86, pulse 92, resp. rate 14,  height 5\' 7"  (1.702 m), weight 233 lb (105.688 kg), last menstrual period 04/01/2008.  Physical Exam Physical Exam  Pulmonary/Chest:         Assessment    Completion of chemotherapy, no further indication for central venous access.     Plan    The area was prepped with alcohol and 10 cc of 0.5% Xylocaine with 0.25% Marcaine with 1-200,000 units of epinephrine was utilized well-tolerated. Chlor prep was applied to the skin. The old incision was opened. Scant bleeding was noted. The port was released from the  Adjacent tissue and extracted with a complete tip. Deep tissue was approximated with running 3-0 Vicryl suture. The skin was closed with a running 4-0 Vicryl subcutaneous tumor suture. Benzoin and Steri-Strips followed by Telfa Tegaderm  dressing were applied. Ice pack provided. Postoperative wound care reviewed. The patient will follow up in one week for evaluation with the nurse.        [name] 02/14/2013, 8:44 PM

## 2013-02-21 ENCOUNTER — Ambulatory Visit (INDEPENDENT_AMBULATORY_CARE_PROVIDER_SITE_OTHER): Payer: 59 | Admitting: *Deleted

## 2013-02-21 DIAGNOSIS — C50912 Malignant neoplasm of unspecified site of left female breast: Secondary | ICD-10-CM

## 2013-02-21 DIAGNOSIS — C50919 Malignant neoplasm of unspecified site of unspecified female breast: Secondary | ICD-10-CM

## 2013-02-21 NOTE — Progress Notes (Signed)
Incision clean and healing well. No signs of infection noted.

## 2013-02-21 NOTE — Patient Instructions (Signed)
As scheduled

## 2013-02-23 ENCOUNTER — Ambulatory Visit: Payer: Self-pay | Admitting: Radiation Oncology

## 2013-04-13 ENCOUNTER — Telehealth: Payer: Self-pay | Admitting: *Deleted

## 2013-04-13 ENCOUNTER — Ambulatory Visit: Payer: Self-pay | Admitting: Internal Medicine

## 2013-04-13 LAB — CBC CANCER CENTER
Basophil #: 0.1 x10 3/mm (ref 0.0–0.1)
Basophil %: 1.1 %
Eosinophil #: 0.1 x10 3/mm (ref 0.0–0.7)
Eosinophil %: 1.7 %
HCT: 39.3 % (ref 35.0–47.0)
HGB: 12.9 g/dL (ref 12.0–16.0)
Lymphocyte #: 2.5 x10 3/mm (ref 1.0–3.6)
Lymphocyte %: 36.4 %
MCH: 28.5 pg (ref 26.0–34.0)
MCHC: 32.9 g/dL (ref 32.0–36.0)
MCV: 87 fL (ref 80–100)
MONO ABS: 0.5 x10 3/mm (ref 0.2–0.9)
Monocyte %: 6.8 %
Neutrophil #: 3.7 x10 3/mm (ref 1.4–6.5)
Neutrophil %: 54 %
Platelet: 263 x10 3/mm (ref 150–440)
RBC: 4.54 10*6/uL (ref 3.80–5.20)
RDW: 13.9 % (ref 11.5–14.5)
WBC: 6.9 x10 3/mm (ref 3.6–11.0)

## 2013-04-13 LAB — HEPATIC FUNCTION PANEL A (ARMC)
ALK PHOS: 95 U/L
ALT: 26 U/L (ref 12–78)
Albumin: 3.8 g/dL (ref 3.4–5.0)
Bilirubin, Direct: 0.1 mg/dL (ref 0.00–0.20)
Bilirubin,Total: 0.2 mg/dL (ref 0.2–1.0)
SGOT(AST): 16 U/L (ref 15–37)
TOTAL PROTEIN: 7.8 g/dL (ref 6.4–8.2)

## 2013-04-13 LAB — CREATININE, SERUM
Creatinine: 1.1 mg/dL (ref 0.60–1.30)
EGFR (African American): >60
GFR CALC NON AF AMER: 58 — AB

## 2013-04-13 NOTE — Telephone Encounter (Signed)
Error

## 2013-04-14 LAB — CANCER ANTIGEN 27.29: CA 27.29: 23.5 U/mL (ref 0.0–38.6)

## 2013-04-23 ENCOUNTER — Ambulatory Visit: Payer: Self-pay | Admitting: Internal Medicine

## 2013-05-24 ENCOUNTER — Ambulatory Visit: Payer: Self-pay | Admitting: Internal Medicine

## 2013-10-19 ENCOUNTER — Ambulatory Visit: Payer: Self-pay | Admitting: Internal Medicine

## 2013-10-26 ENCOUNTER — Ambulatory Visit: Payer: Self-pay | Admitting: Internal Medicine

## 2013-11-02 LAB — CBC CANCER CENTER
BASOS ABS: 0.1 x10 3/mm (ref 0.0–0.1)
Basophil %: 1 %
EOS ABS: 0.1 x10 3/mm (ref 0.0–0.7)
EOS PCT: 1.3 %
HCT: 38.7 % (ref 35.0–47.0)
HGB: 12.8 g/dL (ref 12.0–16.0)
Lymphocyte #: 2.6 x10 3/mm (ref 1.0–3.6)
Lymphocyte %: 37.6 %
MCH: 28.6 pg (ref 26.0–34.0)
MCHC: 32.9 g/dL (ref 32.0–36.0)
MCV: 87 fL (ref 80–100)
MONO ABS: 0.4 x10 3/mm (ref 0.2–0.9)
MONOS PCT: 5.9 %
Neutrophil #: 3.7 x10 3/mm (ref 1.4–6.5)
Neutrophil %: 54.2 %
PLATELETS: 268 x10 3/mm (ref 150–440)
RBC: 4.46 10*6/uL (ref 3.80–5.20)
RDW: 13.9 % (ref 11.5–14.5)
WBC: 6.8 x10 3/mm (ref 3.6–11.0)

## 2013-11-02 LAB — HEPATIC FUNCTION PANEL A (ARMC)
ALK PHOS: 97 U/L
Albumin: 3.5 g/dL (ref 3.4–5.0)
BILIRUBIN TOTAL: 0.4 mg/dL (ref 0.2–1.0)
Bilirubin, Direct: 0.1 mg/dL (ref 0.00–0.20)
SGOT(AST): 18 U/L (ref 15–37)
SGPT (ALT): 20 U/L
Total Protein: 7.6 g/dL (ref 6.4–8.2)

## 2013-11-02 LAB — CREATININE, SERUM
Creatinine: 1.09 mg/dL (ref 0.60–1.30)
EGFR (African American): >60
EGFR (Non-African Amer.): 59 — ABNORMAL LOW

## 2013-11-03 LAB — CANCER ANTIGEN 27.29: CA 27.29: 17.4 U/mL (ref 0.0–38.6)

## 2013-11-23 ENCOUNTER — Ambulatory Visit: Payer: Self-pay | Admitting: Internal Medicine

## 2013-12-11 ENCOUNTER — Telehealth: Payer: Self-pay | Admitting: *Deleted

## 2013-12-11 NOTE — Telephone Encounter (Signed)
Pt called and wanted to talk to you regarding her son Heather Dudley, she didn't want to leave a detailed message.

## 2013-12-12 NOTE — Telephone Encounter (Signed)
I talked with patient and her concern is with her son ([birth year] Theresia Lo). She states he has been developing breast since age 53. She was wondering if Dr. Bary Castilla had seen patients with similar issues. She will try to get a referral from PCP and she will talk with her insurance company as well to see if referral is needed.

## 2013-12-24 ENCOUNTER — Ambulatory Visit: Payer: Self-pay | Admitting: Internal Medicine

## 2013-12-25 ENCOUNTER — Encounter: Payer: Self-pay | Admitting: General Surgery

## 2014-01-24 ENCOUNTER — Ambulatory Visit: Payer: Self-pay | Admitting: Internal Medicine

## 2014-02-23 ENCOUNTER — Ambulatory Visit: Payer: Self-pay | Admitting: Internal Medicine

## 2014-04-26 ENCOUNTER — Ambulatory Visit
Admit: 2014-04-26 | Disposition: A | Discharge status: Home / Self Care | Payer: Self-pay | Attending: Internal Medicine | Admitting: Internal Medicine

## 2014-05-25 ENCOUNTER — Ambulatory Visit
Admit: 2014-05-25 | Disposition: A | Discharge status: Home / Self Care | Payer: Self-pay | Attending: Internal Medicine | Admitting: Internal Medicine

## 2014-06-08 ENCOUNTER — Other Ambulatory Visit: Payer: Self-pay | Admitting: Internal Medicine

## 2014-06-08 DIAGNOSIS — Z853 Personal history of malignant neoplasm of breast: Secondary | ICD-10-CM

## 2014-09-12 ENCOUNTER — Telehealth: Payer: Self-pay

## 2014-09-12 NOTE — Telephone Encounter (Signed)
I contacted patient to complete Medical Conditions and Lifestyle Questionnaire as part of patient follow up for her enrollment in [employer] B-47 research study.  Patient agreeable to answer questions via telephone.  We completed survey and this has been sent to [employer].  Patient is aware of her mammogram and ultrasound appointment in October and her follow up appointment with Dr. Baruch Gouty in December.  I thanked patient for her willingness to complete the survey and continue in follow up status for the study.

## 2014-10-07 ENCOUNTER — Other Ambulatory Visit: Payer: Self-pay | Admitting: Oncology

## 2014-10-08 ENCOUNTER — Other Ambulatory Visit: Payer: Self-pay | Admitting: Internal Medicine

## 2014-10-08 DIAGNOSIS — C50912 Malignant neoplasm of unspecified site of left female breast: Secondary | ICD-10-CM

## 2014-10-08 MED ORDER — VENLAFAXINE HCL 75 MG PO TABS: 75.0000 mg | ORAL_TABLET | Freq: Every day | ORAL | Status: DC

## 2014-10-22 ENCOUNTER — Telehealth: Payer: Self-pay | Admitting: *Deleted

## 2014-10-22 NOTE — Telephone Encounter (Signed)
Please arrange for the patient to come in today if possible. Wednesday if not. I cannot stay late on Wednesday evening.

## 2014-10-22 NOTE — Telephone Encounter (Signed)
Message left for patient to call the office on cell and work numbers. No answer at home number.

## 2014-10-22 NOTE — Telephone Encounter (Signed)
Patient c/o of feeling a "hard lump under left armpit. This is on the same side as my previous breast cancer. I noticed this on Tuesday of last week. There are not signs of infection. It's just a hard lump."  I spoke with Dr. Ma Hillock. Md would like patient to make an appointment with Dr. Bary Castilla for further surgical evaluation of this area. I returned the patient's call and left this message on patient's personal voice mail.

## 2014-10-22 NOTE — Telephone Encounter (Signed)
Patient coming on 10-24-14 at 2:25 pm for evaluation.

## 2014-10-24 ENCOUNTER — Ambulatory Visit (INDEPENDENT_AMBULATORY_CARE_PROVIDER_SITE_OTHER): Payer: 59 | Admitting: General Surgery

## 2014-10-24 ENCOUNTER — Other Ambulatory Visit: Payer: 59

## 2014-10-24 ENCOUNTER — Encounter: Payer: Self-pay | Admitting: General Surgery

## 2014-10-24 VITALS — BP 148/88 | HR 98 | Resp 16 | Ht 66.0 in | Wt 224.0 lb

## 2014-10-24 DIAGNOSIS — N649 Disorder of breast, unspecified: Secondary | ICD-10-CM

## 2014-10-24 DIAGNOSIS — N6459 Other signs and symptoms in breast: Secondary | ICD-10-CM

## 2014-10-24 DIAGNOSIS — D1720 Benign lipomatous neoplasm of skin and subcutaneous tissue of unspecified limb: Secondary | ICD-10-CM | POA: Insufficient documentation

## 2014-10-24 NOTE — Progress Notes (Signed)
Patient ID: Heather Dudley, female   DOB: 1960/08/14, 54 y.o.   MRN: [id number]  Chief Complaint  Patient presents with  . Follow-up    hard lump left axilla    HPI Heather Dudley is a 54 y.o. female here for evaluation of a hard mass in her left axilla. She first noticed this while doing her monthly breast exam last Tuesday. She states that it is not painful and has not changed size. She denies any new breast problems.  HPI  Past Medical History  Diagnosis Date  . Cellulitis and abscess of trunk   . Hypertension   . 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 Thyroid nodule   . Hernia   . Personal history of malignant neoplasm of breast     The patient underwent wide excision, mastoplasty. Dissection for a T1b, N1, M0 carcinoma left breast on September 03, 2010.The primary tumor was 1 cm in diameter, and a single positive axillary node 1.3 cm in diameter. This was an ER-positive, PR slightly positive, HER-2/neu not overexpressing tumor.  The patient completed whole breast radiation in February 2013.  . Breast screening, unspecified   . Lump or mass in breast   . Obesity, unspecified   . History of chemotherapy 2012  . Screening for obesity   . Malignant neoplasm of breast (female), unspecified site     She underwent wide excision, mastoplasty and axillary dissection for her left breast malignancy on September 03, 2010. The primary tumor was 1 cm in diameter, and a single positive axillary node 1.3 cm in diameter. This was an ER-positive, PR slightly positive, HER-2/neu not overexpressing tumor. She tolerated her whole breast radiation without difficulty ending in late February 2013.  [name] Basal cell carcinoma of forehead     birthmark of forehead  . Malignant neoplasm of upper-outer quadrant of female breast     Past Surgical History  Procedure Laterality Date  . Tubal ligation    . Tonsillectomy    . Birth mark removed    . Breast lumpectomy  2012   left breast  . Breast biopsy  August 05, 2010    left breat [facility] biopsy  . Abdominal hysterectomy  2011  . Thyroid surgery      partially removed  . Colonoscopy  July 29, 2012    normal study.    Family History  Problem Relation Age of Onset  . Breast cancer Other   . Colon cancer Other   . Ovarian cancer Other     Social History Social History  Substance Use Topics  . Smoking status: Never Smoker   . Smokeless tobacco: Never Used  . Alcohol Use: No    Allergies  Allergen Reactions  . Penicillins Rash    Current Outpatient Prescriptions  Medication Sig Dispense Refill  . anastrozole (ARIMIDEX) 1 MG tablet Take 1 mg by mouth daily.    . Calcium Carbonate-Vitamin D (CALCIUM PLUS VITAMIN D PO) Take 2 tablets by mouth daily.    . Ibuprofen (ADVIL PO) Take by mouth.    [name] lisinopril-hydrochlorothiazide (PRINZIDE,ZESTORETIC) 10-12.5 MG per tablet Take 1 tablet by mouth daily.    . Multiple Vitamins-Minerals (MULTIVITAMIN PO) Take by mouth daily.    [name] venlafaxine (EFFEXOR) 75 MG tablet Take 1 tablet (75 mg total) by mouth daily. 30 tablet 4   No current facility-administered medications for this visit.    Review of Systems Review of Systems  Constitutional: Negative.  Respiratory: Negative.   Cardiovascular: Negative.     Blood pressure 148/88, pulse 98, resp. rate 16, height $RemoveBe'5\' 6"'jLqrcoism$  (1.676 m), weight 224 lb (101.606 kg), last menstrual period 04/01/2008.  Physical Exam Physical Exam  Constitutional: She is oriented to person, place, and time. She appears well-developed and well-nourished.  Eyes: Conjunctivae are normal. No scleral icterus.  Neck: Neck supple.  Cardiovascular: Normal rate, regular rhythm and normal heart sounds.   Pulmonary/Chest: Effort normal and breath sounds normal. Right breast exhibits no inverted nipple, no mass, no nipple discharge, no skin change and no tenderness. Left breast exhibits skin change (Thickening below the lateral edge of the wide  excision.). Left breast exhibits no inverted nipple, no mass, no nipple discharge and no tenderness.    Volume loss on the left breast. 1.5 cm thickening just below the axillary incision on the left.   Lymphadenopathy:    She has no cervical adenopathy.  Neurological: She is alert and oriented to person, place, and time.  Skin: Skin is warm and dry.  Psychiatric: She has a normal mood and affect.    Data Reviewed 12/04/2013 bilateral diagnostic mammograms were reviewed. No interval change. By red-2.  Ultrasound examination of the superior aspect of the left breast and axilla was completed. Area of the sentinel node site showed mild scarring. No adenopathy. Axillary vein and artery are patent.  The area of palpable thickening in the apex of the axilla is a isoechoic smoothly marginated 0.8 x 1.2 x 2.8 cm lesion consistent with a lipoma. BI-RADS-2.  Assessment    Left axillary lipoma.    Plan    No intervention is required.  The patient will continue annual mammograms in follow-up with Leia Alf, M.D. Follow-up ear will be on an as-needed basis.      Robert Bellow 10/24/2014, 3:40 PM

## 2014-10-24 NOTE — Patient Instructions (Signed)
Lipoma  A lipoma is a noncancerous (benign) tumor composed of fat cells. They are usually found under the skin (subcutaneous). A lipoma may occur in any tissue of the body that contains fat. Common areas for lipomas to appear include the back, shoulders, buttocks, and thighs. Lipomas are a very common soft tissue growth. They are soft and grow slowly. Most problems caused by a lipoma depend on where it is growing.  DIAGNOSIS   A lipoma can be diagnosed with a physical exam. These tumors rarely become cancerous, but radiographic studies can help determine this for certain. Studies used may include:  · Computerized X-ray scans (CT or CAT scan).  · Computerized magnetic scans (MRI).  TREATMENT   Small lipomas that are not causing problems may be watched. If a lipoma continues to enlarge or causes problems, removal is often the best treatment. Lipomas can also be removed to improve appearance. Surgery is done to remove the fatty cells and the surrounding capsule. Most often, this is done with medicine that numbs the area (local anesthetic). The removed tissue is examined under a microscope to make sure it is not cancerous. Keep all follow-up appointments with your caregiver.  SEEK MEDICAL CARE IF:   · The lipoma becomes larger or hard.  · The lipoma becomes painful, red, or increasingly swollen. These could be signs of infection or a more serious condition.  Document Released: 01/30/2002 Document Revised: 05/04/2011 Document Reviewed: 07/12/2009  ExitCare® Patient Information ©2015 [employer]. This information is not intended to replace advice given to you by your health care provider. Make sure you discuss any questions you have with your health care provider.

## 2014-11-09 ENCOUNTER — Other Ambulatory Visit: Payer: Self-pay | Admitting: *Deleted

## 2014-11-09 MED ORDER — VENLAFAXINE HCL ER 150 MG PO CP24: 150.0000 mg | ORAL_CAPSULE | Freq: Every day | ORAL | Status: DC

## 2014-11-09 NOTE — Telephone Encounter (Signed)
E scribed 150 ER caps and pharmacy notified to cancel 75 mg rx I spoke with Abigail Butts at [facility] in [place]

## 2014-11-09 NOTE — Telephone Encounter (Signed)
Venlafaxine 150 ER requested by fax today,  we sent 75mg  IR tabs in on 8/16. Which is correct? In the old system, she was on  150 mg ER tabs. Did you decrease her dose?

## 2014-11-12 ENCOUNTER — Telehealth: Payer: Self-pay | Admitting: *Deleted

## 2014-11-12 NOTE — Telephone Encounter (Signed)
Done

## 2014-12-10 ENCOUNTER — Other Ambulatory Visit: Payer: Self-pay | Admitting: Internal Medicine

## 2014-12-10 ENCOUNTER — Ambulatory Visit
Admission: RE | Admit: 2014-12-10 | Discharge: 2014-12-10 | Disposition: A | Discharge status: Home / Self Care | Payer: 59 | Source: Ambulatory Visit | Attending: Internal Medicine | Admitting: Internal Medicine

## 2014-12-10 DIAGNOSIS — Z853 Personal history of malignant neoplasm of breast: Secondary | ICD-10-CM

## 2014-12-20 ENCOUNTER — Other Ambulatory Visit: Payer: Self-pay | Admitting: *Deleted

## 2014-12-20 ENCOUNTER — Encounter: Payer: Self-pay | Admitting: Family Medicine

## 2014-12-20 ENCOUNTER — Inpatient Hospital Stay (HOSPITAL_BASED_OUTPATIENT_CLINIC_OR_DEPARTMENT_OTHER): Payer: 59 | Admitting: Family Medicine

## 2014-12-20 ENCOUNTER — Inpatient Hospital Stay: Payer: 59 | Attending: Family Medicine

## 2014-12-20 VITALS — BP 149/84 | HR 92 | Temp 98.2°F | Resp 20 | Wt 228.0 lb

## 2014-12-20 DIAGNOSIS — Z79899 Other long term (current) drug therapy: Secondary | ICD-10-CM

## 2014-12-20 DIAGNOSIS — E041 Nontoxic single thyroid nodule: Secondary | ICD-10-CM

## 2014-12-20 DIAGNOSIS — Z8041 Family history of malignant neoplasm of ovary: Secondary | ICD-10-CM

## 2014-12-20 DIAGNOSIS — Z800 Family history of malignant neoplasm of digestive organs: Secondary | ICD-10-CM | POA: Diagnosis not present

## 2014-12-20 DIAGNOSIS — E669 Obesity, unspecified: Secondary | ICD-10-CM | POA: Diagnosis not present

## 2014-12-20 DIAGNOSIS — Z803 Family history of malignant neoplasm of breast: Secondary | ICD-10-CM | POA: Insufficient documentation

## 2014-12-20 DIAGNOSIS — C50412 Malignant neoplasm of upper-outer quadrant of left female breast: Secondary | ICD-10-CM | POA: Insufficient documentation

## 2014-12-20 DIAGNOSIS — I10 Essential (primary) hypertension: Secondary | ICD-10-CM | POA: Diagnosis not present

## 2014-12-20 DIAGNOSIS — R232 Flushing: Secondary | ICD-10-CM

## 2014-12-20 DIAGNOSIS — Z9221 Personal history of antineoplastic chemotherapy: Secondary | ICD-10-CM | POA: Insufficient documentation

## 2014-12-20 DIAGNOSIS — Z79811 Long term (current) use of aromatase inhibitors: Secondary | ICD-10-CM

## 2014-12-20 DIAGNOSIS — Z85828 Personal history of other malignant neoplasm of skin: Secondary | ICD-10-CM | POA: Insufficient documentation

## 2014-12-20 DIAGNOSIS — R599 Enlarged lymph nodes, unspecified: Secondary | ICD-10-CM

## 2014-12-20 DIAGNOSIS — Z170 Estrogen receptor positive status [ER+]: Secondary | ICD-10-CM

## 2014-12-20 DIAGNOSIS — C50912 Malignant neoplasm of unspecified site of left female breast: Secondary | ICD-10-CM

## 2014-12-20 LAB — CBC WITH DIFFERENTIAL/PLATELET
BASOS ABS: 0.1 10*3/uL (ref 0–0.1)
BASOS PCT: 1 %
EOS ABS: 0.1 10*3/uL (ref 0–0.7)
EOS PCT: 2 %
HCT: 38.7 % (ref 35.0–47.0)
Hemoglobin: 12.9 g/dL (ref 12.0–16.0)
Lymphocytes Relative: 30 %
Lymphs Abs: 2.6 10*3/uL (ref 1.0–3.6)
MCH: 28.3 pg (ref 26.0–34.0)
MCHC: 33.5 g/dL (ref 32.0–36.0)
MCV: 84.4 fL (ref 80.0–100.0)
Monocytes Absolute: 0.5 10*3/uL (ref 0.2–0.9)
Monocytes Relative: 6 %
NEUTROS PCT: 61 %
Neutro Abs: 5.4 10*3/uL (ref 1.4–6.5)
PLATELETS: 264 10*3/uL (ref 150–440)
RBC: 4.58 MIL/uL (ref 3.80–5.20)
RDW: 13.9 % (ref 11.5–14.5)
WBC: 8.8 10*3/uL (ref 3.6–11.0)

## 2014-12-20 LAB — HEPATIC FUNCTION PANEL
ALK PHOS: 85 U/L (ref 38–126)
ALT: 11 U/L — ABNORMAL LOW (ref 14–54)
AST: 19 U/L (ref 15–41)
Albumin: 4.1 g/dL (ref 3.5–5.0)
Bilirubin, Direct: <0.1 mg/dL — ABNORMAL LOW (ref 0.1–0.5)
TOTAL PROTEIN: 7.7 g/dL (ref 6.5–8.1)
Total Bilirubin: 0.4 mg/dL (ref 0.3–1.2)

## 2014-12-20 LAB — CREATININE, SERUM: CREATININE: 1.04 mg/dL — AB (ref 0.44–1.00)

## 2014-12-20 MED ORDER — LISINOPRIL-HYDROCHLOROTHIAZIDE 10-12.5 MG PO TABS: 1.0000 | ORAL_TABLET | Freq: Every day | ORAL | Status: DC

## 2014-12-20 NOTE — Progress Notes (Signed)
[place]  [phone]  [phone]     Heather Dudley DOB: [birth year]  MR#: [id number]  OIB#:[id number]  Patient Care Team: Trude Mcburney Dear, MD as PCP - General (Family Medicine) Robert Bellow, MD as Consulting Physician (General Surgery)  CHIEF COMPLAINT:  Breast Cancer, no acute complaints today. Tumor size 1.0 cm, grade 2, margins clear.  One of 14 left axillary lymph nodes showed macrometastasis of 1.3 cm focus. ER positive (90%), PR positive (20%), HER-2/neu negative by FISH (HER-2/CEP17 ratio < 1.80). Patient on adjuvant chemotherapy, got AC x 4, then completed weekly paclitaxel on 02/06/11, completed Herceptin on 11/13/11  (has enrolled on NSABP B-47). On Anastrazole since June 2013.  INTERVAL HISTORY:  Patient is here for further follow up and treatment consideration regarding carcinoma of breast. Patient originally diagnosed in 2012 with pT1b pN1 cMx, grade 2 invasive ductal carcinoma of the left breast. She is status post lumpectomy, sentinel node study and axillary node dissection 08/2010. She has been on Anastrazole since 2013 and tolerating well. She continues with some intermittent hot flashes, she is also taking venlafaxine for management with good effect. She has recently had a mammogram on December 10, 2014 that was BiRad 2. She was also seen by Dr. Bary Castilla in August for a lump in the left breast which was evaluated in office and with [place] and determined to be a Lipoma. She denies any new complaints and overall feels very well.  REVIEW OF SYSTEMS:   Review of Systems  Constitutional: Negative for fever, chills, weight loss, malaise/fatigue and diaphoresis.       Hot flashes, well controlled with medications  HENT: Negative for congestion, ear discharge, ear pain, hearing loss, nosebleeds, sore throat and tinnitus.   Eyes: Negative for blurred vision, double vision, photophobia, pain, discharge and redness.  Respiratory: Negative for  cough, hemoptysis, sputum production, shortness of breath, wheezing and stridor.   Cardiovascular: Negative for chest pain, palpitations, orthopnea, claudication, leg swelling and PND.  Gastrointestinal: Negative for heartburn, nausea, vomiting, abdominal pain, diarrhea, constipation, blood in stool and melena.  Genitourinary: Negative.   Musculoskeletal: Negative.   Skin: Negative.   Neurological: Negative for dizziness, tingling, focal weakness, seizures, weakness and headaches.  Endo/Heme/Allergies: Does not bruise/bleed easily.  Psychiatric/Behavioral: Negative for depression. The patient is not nervous/anxious and does not have insomnia.     As per HPI. Otherwise, a complete review of systems is negatve.  ONCOLOGY HISTORY: Oncology History   pT1b pN1 cMx, grade 2 invasive ductal carcinoma of the left breast status post lumpectomy, sentinel node study and axillary node dissection 09/03/2010. Tumor size 1.0 cm, grade 2, margins clear.  One of 14 left axillary lymph nodes showed macrometastasis of 1.3 cm focus. ER positive (90%), PR positive (20%), HER-2/neu negative by FISH (HER-2/CEP17 ratio < 1.80).  2. Incidental finding of right axillary lymphadenopathy on MRI, negative on PET.  Patient on adjuvant chemotherapy, got AC x 4, then completed weekly paclitaxel on 02/06/11, completed Herceptin on 11/13/11  (has enrolled on NSABP B-47). On Anastrazole since June 2013.      Breast cancer, left breast ([place])   09/02/2010 Initial Diagnosis Breast cancer, left breast [facility])    PAST MEDICAL HISTORY: Past Medical History  Diagnosis Date  . Cellulitis and abscess of trunk   . Hypertension   . 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 Thyroid nodule   . Hernia   . Personal history of malignant  neoplasm of breast     The patient underwent wide excision, mastoplasty. Dissection for a T1b, N1, M0 carcinoma left breast on September 03, 2010.The primary tumor was 1 cm  in diameter, and a single positive axillary node 1.3 cm in diameter. This was an ER-positive, PR slightly positive, HER-2/neu not overexpressing tumor.  The patient completed whole breast radiation in February 2013.  . Breast screening, unspecified   . Lump or mass in breast   . Obesity, unspecified   . History of chemotherapy 2012  . Screening for obesity   . Malignant neoplasm of breast (female), unspecified site     She underwent wide excision, mastoplasty and axillary dissection for her left breast malignancy on September 03, 2010. The primary tumor was 1 cm in diameter, and a single positive axillary node 1.3 cm in diameter. This was an ER-positive, PR slightly positive, HER-2/neu not overexpressing tumor. She tolerated her whole breast radiation without difficulty ending in late February 2013.  [name] Basal cell carcinoma of forehead     birthmark of forehead  . Malignant neoplasm of upper-outer quadrant of female breast ([place])     PAST SURGICAL HISTORY: Past Surgical History  Procedure Laterality Date  . Tubal ligation    . Tonsillectomy    . Birth mark removed    . Breast lumpectomy  2012    left breast  . Abdominal hysterectomy  2011  . Thyroid surgery      partially removed  . Colonoscopy  July 29, 2012    normal study.  . Breast excisional biopsy  August 05, 2010    left breast positive 07/2010  . Breast cyst aspiration Right     negative 01/2010    FAMILY HISTORY Family History  Problem Relation Age of Onset  . Breast cancer Other   . Colon cancer Other   . Ovarian cancer Other     GYNECOLOGIC HISTORY:  Patient's last menstrual period was 04/01/2008.     ADVANCED DIRECTIVES:    HEALTH MAINTENANCE: Social History  Substance Use Topics  . Smoking status: Never Smoker   . Smokeless tobacco: Never Used  . Alcohol Use: No     Colonoscopy:  PAP:  Bone density:  Lipid panel:  Allergies  Allergen Reactions  . Penicillins Rash    Current Outpatient Prescriptions    Medication Sig Dispense Refill  . anastrozole (ARIMIDEX) 1 MG tablet Take 1 mg by mouth daily.    . Calcium Carbonate-Vitamin D (CALCIUM PLUS VITAMIN D PO) Take 2 tablets by mouth daily.    . Ibuprofen (ADVIL PO) Take by mouth.    [name] lisinopril-hydrochlorothiazide (PRINZIDE,ZESTORETIC) 10-12.5 MG tablet Take 1 tablet by mouth daily. 30 tablet 2  . Multiple Vitamins-Minerals (MULTIVITAMIN PO) Take by mouth daily.    [name] venlafaxine XR (EFFEXOR-XR) 150 MG 24 hr capsule Take 1 capsule (150 mg total) by mouth daily with breakfast. 30 capsule 6   No current facility-administered medications for this visit.    OBJECTIVE: LMP 04/01/2008   There is no weight on file to calculate BMI.    ECOG FS:0 - Asymptomatic  General: Well-developed, well-nourished, no acute distress. Eyes: Pink conjunctiva, anicteric sclera. HEENT: Normocephalic, moist mucous membranes, clear oropharnyx. Lungs: Clear to auscultation bilaterally. Heart: Regular rate and rhythm. No rubs, murmurs, or gallops. Abdomen: Soft, nontender, nondistended. No organomegaly noted, normoactive bowel sounds. Breast: Left breast lumpectomy site, breast palpated in a circular manner in the sitting and supine positions.  No masses or fullness  palpated.  Axilla palpated in both positions with no masses or fullness palpated.  Musculoskeletal: No edema, cyanosis, or clubbing. Neuro: Alert, answering all questions appropriately. Cranial nerves grossly intact. Skin: No rashes or petechiae noted. Psych: Normal affect. Lymphatics: No cervical, clavicular, axillary LAD.   LAB RESULTS:  Appointment on 12/20/2014  Component Date Value Ref Range Status  . WBC 12/20/2014 8.8  3.6 - 11.0 K/uL Final  . RBC 12/20/2014 4.58  3.80 - 5.20 MIL/uL Final  . Hemoglobin 12/20/2014 12.9  12.0 - 16.0 g/dL Final  . HCT 12/20/2014 38.7  35.0 - 47.0 % Final  . MCV 12/20/2014 84.4  80.0 - 100.0 fL Final  . MCH 12/20/2014 28.3  26.0 - 34.0 pg Final  . MCHC  12/20/2014 33.5  32.0 - 36.0 g/dL Final  . RDW 12/20/2014 13.9  11.5 - 14.5 % Final  . Platelets 12/20/2014 264  150 - 440 K/uL Final  . Neutrophils Relative % 12/20/2014 61   Final  . Neutro Abs 12/20/2014 5.4  1.4 - 6.5 K/uL Final  . Lymphocytes Relative 12/20/2014 30   Final  . Lymphs Abs 12/20/2014 2.6  1.0 - 3.6 K/uL Final  . Monocytes Relative 12/20/2014 6   Final  . Monocytes Absolute 12/20/2014 0.5  0.2 - 0.9 K/uL Final  . Eosinophils Relative 12/20/2014 2   Final  . Eosinophils Absolute 12/20/2014 0.1  0 - 0.7 K/uL Final  . Basophils Relative 12/20/2014 1   Final  . Basophils Absolute 12/20/2014 0.1  0 - 0.1 K/uL Final    STUDIES: No results found.  ASSESSMENT:  Carcinoma of breast.  PLAN:   1. Breast ca. pT1b pN1 cMx, grade 2 invasive ductal carcinoma of the left breast status post lumpectomy, sentinel node study and axillary node dissection 09/03/2010. Tumor size 1.0 cm, grade 2, margins clear.  One of 14 left axillary lymph nodes showed macrometastasis of 1.3 cm focus. ER positive (90%), PR positive (20%), HER-2/neu negative by FISH (HER-2/CEP17 ratio < 1.80). Patient completed adjuvant chemotherapy, AC x 4, then weekly paclitaxel on 02/06/11, completed Herceptin on 11/13/11 as per protocol NSABP B-47.  Clinically there is no evidence of recurrent disease. Patient has been on Anastrazole since June 2013, previously established plan of 7 years total. Tolerating Anastrazole, calcium, and vitamin D well. Continues with some intermittent hot flashes but states that venlafaxine $RemoveBeforeDE'150mg'[id number]$  every night at bedtime has helped greatly with these.  Mammogram was performed December 10, 2014 and reported as BiRad 2, DEXA scan performed in April 2015.  Will schedule next DEXA in April 2017 as she continue with anastrazole, high risk medications for osteoporosis.   Will continue with routine follow up in 6 months.  Patient expressed understanding and was in agreement with this plan. She also  understands that She can call clinic at any time with any questions, concerns, or complaints.   Dr. Oliva Bustard was available for consultation and review of plan of care for this patient.   Evlyn Kanner, NP   12/20/2014 10:34 AM

## 2014-12-21 LAB — CANCER ANTIGEN 27.29: CA 27.29: 21 U/mL (ref 0.0–38.6)

## 2015-01-03 ENCOUNTER — Other Ambulatory Visit: Payer: Self-pay

## 2015-01-03 ENCOUNTER — Ambulatory Visit: Payer: Self-pay

## 2015-01-28 ENCOUNTER — Encounter: Payer: Self-pay | Admitting: Radiation Oncology

## 2015-01-28 ENCOUNTER — Ambulatory Visit
Admission: RE | Admit: 2015-01-28 | Discharge: 2015-01-28 | Disposition: A | Discharge status: Home / Self Care | Payer: 59 | Source: Ambulatory Visit | Attending: Radiation Oncology | Admitting: Radiation Oncology

## 2015-01-28 VITALS — BP 157/86 | HR 100 | Temp 98.0°F | Wt 231.8 lb

## 2015-01-28 DIAGNOSIS — Z853 Personal history of malignant neoplasm of breast: Secondary | ICD-10-CM

## 2015-01-28 NOTE — Progress Notes (Signed)
Radiation Oncology Follow up Note  Name: Heather Dudley   Date:   01/28/2015 MRN:  [id number] DOB: 1960-07-17    This 54 y.o. female presents to the clinic today for follow-up for breast cancer now 4 years out for a T1 be N1 M0 invasive mammary carcinoma of the left breast status post wide local excision and mastoplasty ER positive PR slightly positive HER-2/neu not overexpressed.  REFERRING PROVIDER: No ref. provider found  HPI: Patient is a 54 year old female now out over 4 years having completed radiation therapy to her left breast for a T1 be N1 M0 invasive mammary carcinoma status post wide local excision mass mastoplasty. She is seen today in routine follow-up is doing well. She was recently in evaluating for a self-described hard mass in her left axilla. Which was an axillary lipoma. She is seen today in routine follow-up and is doing well she specifically denies breast tenderness cough or bone pain. She currently is on letrozole tolerating that well without side effect.  COMPLICATIONS OF TREATMENT: none  FOLLOW UP COMPLIANCE: keeps appointments   PHYSICAL EXAM:  BP 157/86 mmHg  Pulse 100  Temp(Src) 98 F (36.7 C)  Wt 231 lb 13 oz (105.15 kg)  LMP 04/01/2008 Lungs are clear to A&P cardiac examination essentially unremarkable with regular rate and rhythm. No dominant mass or nodularity is noted in either breast in 2 positions examined. Incision is well-healed. No axillary or supraclavicular adenopathy is appreciated. Cosmetic result is excellent. Well-developed well-nourished patient in NAD. HEENT reveals PERLA, EOMI, discs not visualized.  Oral cavity is clear. No oral mucosal lesions are identified. Neck is clear without evidence of cervical or supraclavicular adenopathy. Lungs are clear to A&P. Cardiac examination is essentially unremarkable with regular rate and rhythm without murmur rub or thrill. Abdomen is benign with no organomegaly or masses noted. Motor sensory and DTR  levels are equal and symmetric in the upper and lower extremities. Cranial nerves II through XII are grossly intact. Proprioception is intact. No peripheral adenopathy or edema is identified. No motor or sensory levels are noted. Crude visual fields are within normal range.  RADIOLOGY RESULTS: Mammograms are reviewed  PLAN: Present time now for you she continues to do well with no evidence of disease. I am please were overall progress. I've asked to see her back in 1 year and then will discontinue follow-up care. Patient knows to call with any concerns. She continues close follow-up care with surgeon.  I would like to take this opportunity for allowing me to participate in the care of your patient.Armstead Peaks., MD

## 2015-04-16 ENCOUNTER — Emergency Department: Payer: 59

## 2015-04-16 ENCOUNTER — Emergency Department
Admission: EM | Admit: 2015-04-16 | Discharge: 2015-04-16 | Disposition: A | Discharge status: Home / Self Care | Payer: 59 | Attending: Emergency Medicine | Admitting: Emergency Medicine

## 2015-04-16 ENCOUNTER — Encounter: Payer: Self-pay | Admitting: Emergency Medicine

## 2015-04-16 DIAGNOSIS — I10 Essential (primary) hypertension: Secondary | ICD-10-CM | POA: Diagnosis not present

## 2015-04-16 DIAGNOSIS — M79602 Pain in left arm: Secondary | ICD-10-CM | POA: Insufficient documentation

## 2015-04-16 DIAGNOSIS — R531 Weakness: Secondary | ICD-10-CM | POA: Diagnosis present

## 2015-04-16 DIAGNOSIS — Z880 Allergy status to penicillin: Secondary | ICD-10-CM | POA: Diagnosis not present

## 2015-04-16 DIAGNOSIS — Z79899 Other long term (current) drug therapy: Secondary | ICD-10-CM | POA: Diagnosis not present

## 2015-04-16 DIAGNOSIS — M25512 Pain in left shoulder: Secondary | ICD-10-CM | POA: Diagnosis not present

## 2015-04-16 DIAGNOSIS — M7918 Myalgia, other site: Secondary | ICD-10-CM

## 2015-04-16 DIAGNOSIS — Z791 Long term (current) use of non-steroidal anti-inflammatories (NSAID): Secondary | ICD-10-CM | POA: Diagnosis not present

## 2015-04-16 LAB — URINALYSIS COMPLETE WITH MICROSCOPIC (ARMC ONLY)
BACTERIA UA: NONE SEEN
BILIRUBIN URINE: NEGATIVE
GLUCOSE, UA: NEGATIVE mg/dL
Ketones, ur: NEGATIVE mg/dL
NITRITE: NEGATIVE
Protein, ur: 30 mg/dL — AB
Specific Gravity, Urine: 1.008 (ref 1.005–1.030)
pH: 6 (ref 5.0–8.0)

## 2015-04-16 LAB — TROPONIN I

## 2015-04-16 LAB — CBC
HEMATOCRIT: 40.2 % (ref 35.0–47.0)
HEMOGLOBIN: 13.3 g/dL (ref 12.0–16.0)
MCH: 28.1 pg (ref 26.0–34.0)
MCHC: 33.1 g/dL (ref 32.0–36.0)
MCV: 84.7 fL (ref 80.0–100.0)
Platelets: 341 10*3/uL (ref 150–440)
RBC: 4.74 MIL/uL (ref 3.80–5.20)
RDW: 13.9 % (ref 11.5–14.5)
WBC: 10.6 10*3/uL (ref 3.6–11.0)

## 2015-04-16 LAB — BASIC METABOLIC PANEL
Anion gap: 8 (ref 5–15)
BUN: 15 mg/dL (ref 6–20)
CO2: 28 mmol/L (ref 22–32)
Calcium: 9.6 mg/dL (ref 8.9–10.3)
Chloride: 102 mmol/L (ref 101–111)
Creatinine, Ser: 1.09 mg/dL — ABNORMAL HIGH (ref 0.44–1.00)
GFR calc Af Amer: >60 mL/min (ref >60)
GFR, EST NON AFRICAN AMERICAN: 57 mL/min — AB (ref >60)
GLUCOSE: 89 mg/dL (ref 65–99)
POTASSIUM: 3.4 mmol/L — AB (ref 3.5–5.1)
Sodium: 138 mmol/L (ref 135–145)

## 2015-04-16 NOTE — ED Notes (Signed)
Pt to ed with c/o left side facial "heaviness" and left arm numbness and tingling.  Pt reports symptoms started about 2 weeks ago.  Went to urgent care this am, but was referred to ED.

## 2015-04-16 NOTE — Discharge Instructions (Signed)
Please wear your left arm sleeve, take Tylenol or Motrin as needed for discomfort. Please follow-up your primary care physician 2-3 days to recheck/reevaluation. Return to the emergency department for any worsening pain, chest pain, or any other symptom personally concerning to your self.   Musculoskeletal Pain Musculoskeletal pain is muscle and boney aches and pains. These pains can occur in any part of the body. Your caregiver may treat you without knowing the cause of the pain. They may treat you if blood or urine tests, X-rays, and other tests were normal.  CAUSES There is often not a definite cause or reason for these pains. These pains may be caused by a type of germ (virus). The discomfort may also come from overuse. Overuse includes working out too hard when your body is not fit. Boney aches also come from weather changes. Bone is sensitive to atmospheric pressure changes. HOME CARE INSTRUCTIONS   Ask when your test results will be ready. Make sure you get your test results.  Only take over-the-counter or prescription medicines for pain, discomfort, or fever as directed by your caregiver. If you were given medications for your condition, do not drive, operate machinery or power tools, or sign legal documents for 24 hours. Do not drink alcohol. Do not take sleeping pills or other medications that may interfere with treatment.  Continue all activities unless the activities cause more pain. When the pain lessens, slowly resume normal activities. Gradually increase the intensity and duration of the activities or exercise.  During periods of severe pain, bed rest may be helpful. Lay or sit in any position that is comfortable.  Putting ice on the injured area.  Put ice in a bag.  Place a towel between your skin and the bag.  Leave the ice on for 15 to 20 minutes, 3 to 4 times a day.  Follow up with your caregiver for continued problems and no reason can be found for the pain. If the pain  becomes worse or does not go away, it may be necessary to repeat tests or do additional testing. Your caregiver may need to look further for a possible cause. SEEK IMMEDIATE MEDICAL CARE IF:  You have pain that is getting worse and is not relieved by medications.  You develop chest pain that is associated with shortness or breath, sweating, feeling sick to your stomach (nauseous), or throw up (vomit).  Your pain becomes localized to the abdomen.  You develop any new symptoms that seem different or that concern you. MAKE SURE YOU:   Understand these instructions.  Will watch your condition.  Will get help right away if you are not doing well or get worse.   This information is not intended to replace advice given to you by your health care provider. Make sure you discuss any questions you have with your health care provider.   Document Released: 02/09/2005 Document Revised: 05/04/2011 Document Reviewed: 10/14/2012 Elsevier Interactive Patient Education [employer].

## 2015-04-16 NOTE — ED Provider Notes (Signed)
[facility] Emergency Department Provider Note  Time seen: 4:07 PM  I have reviewed the triage vital signs and the nursing notes.   HISTORY  Chief Complaint Weakness    HPI Heather Dudley is a 55 y.o. female with a past medical history of hypertension, left breast lumpectomy, left breast cancer, who presents to the emergency department with left arm pain. According to the patient she had her lumpectomy approximately 5 years ago. Since that time she will have occasional swelling in the left upper extremity has never had discomfort. For the past 2 weeks the patient states she has been feeling mild swelling as well as discomfort in the left upper extremity. He states it feels slightly weak as well, denies any numbness. Denies any pain with palpation of the arm although she states she does feel discomfort in her muscles of her left upper back and left upper chest, worse with movement of the arm. Patient also states she has felt a slight sensation of heaviness especially in the left part of her face for the past 2-3 weeks. She says she didn't think anything of it until she was in a doctor's office yesterday reading a pamphlet about stroke symptoms when it occurred to her that this could be a symptom of a stroke so she came to the emergency department today for evaluation. Patient denies any facial symptoms currently. Denies any weakness feeling in the left arm but does state that it feels swollen and feels uncomfortable to her. States the pain is worse with movement of the arm.    Past Medical History  Diagnosis Date  . Cellulitis and abscess of trunk   . Hypertension   . 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 Thyroid nodule   . Hernia   . Personal history of malignant neoplasm of breast     The patient underwent wide excision, mastoplasty. Dissection for a T1b, N1, M0 carcinoma left breast on September 03, 2010.The primary tumor was 1 cm  in diameter, and a single positive axillary node 1.3 cm in diameter. This was an ER-positive, PR slightly positive, HER-2/neu not overexpressing tumor.  The patient completed whole breast radiation in February 2013.  . Breast screening, unspecified   . Lump or mass in breast   . Obesity, unspecified   . History of chemotherapy 2012  . Screening for obesity   . Malignant neoplasm of breast (female), unspecified site     She underwent wide excision, mastoplasty and axillary dissection for her left breast malignancy on September 03, 2010. The primary tumor was 1 cm in diameter, and a single positive axillary node 1.3 cm in diameter. This was an ER-positive, PR slightly positive, HER-2/neu not overexpressing tumor. She tolerated her whole breast radiation without difficulty ending in late February 2013.  [name] Basal cell carcinoma of forehead     birthmark of forehead  . Malignant neoplasm of upper-outer quadrant of female breast [facility])     Patient Active Problem List   Diagnosis Date Noted  . Lipoma of axilla 10/24/2014  . Breast cancer, left breast ([place]) 09/02/2010    Past Surgical History  Procedure Laterality Date  . Tubal ligation    . Tonsillectomy    . Birth mark removed    . Breast lumpectomy  2012    left breast  . Abdominal hysterectomy  2011  . Thyroid surgery      partially removed  . Colonoscopy  July 29, 2012    normal study.  . Breast excisional biopsy  August 05, 2010    left breast positive 07/2010  . Breast cyst aspiration Right     negative 01/2010    Current Outpatient Rx  Name  Route  Sig  Dispense  Refill  . anastrozole (ARIMIDEX) 1 MG tablet   Oral   Take 1 mg by mouth daily.         . Calcium Carbonate-Vitamin D (CALCIUM PLUS VITAMIN D PO)   Oral   Take 2 tablets by mouth daily.         . Ibuprofen (ADVIL PO)   Oral   Take by mouth.         [name] lisinopril-hydrochlorothiazide (PRINZIDE,ZESTORETIC) 10-12.5 MG tablet   Oral   Take 1 tablet by mouth daily.    30 tablet   2   . Multiple Vitamins-Minerals (MULTIVITAMIN PO)   Oral   Take by mouth daily.         [name] venlafaxine XR (EFFEXOR-XR) 150 MG 24 hr capsule   Oral   Take 1 capsule (150 mg total) by mouth daily with breakfast.   30 capsule   6     Allergies Penicillins  Family History  Problem Relation Age of Onset  . Breast cancer Other   . Colon cancer Other   . Ovarian cancer Other     Social History Social History  Substance Use Topics  . Smoking status: Never Smoker   . Smokeless tobacco: Never Used  . Alcohol Use: No    Review of Systems Constitutional: Negative for fever. Cardiovascular: Negative for chest pain. Respiratory: Negative for shortness of breath. Gastrointestinal: Negative for abdominal pain Musculoskeletal:  Positive for left arm and left shoulder discomfort Skin: Negative for rash. Neurological: Negative for headache. States intermittent feeling of left facial heaviness. 10-point ROS otherwise negative.  ____________________________________________   PHYSICAL EXAM:  VITAL SIGNS: ED Triage Vitals  Enc Vitals Group     BP 04/16/15 1244 135/90 mmHg     Pulse Rate 04/16/15 1244 100     Resp 04/16/15 1244 18     Temp 04/16/15 1244 98.3 F (36.8 C)     Temp src --      SpO2 04/16/15 1244 97 %     Weight 04/16/15 1244 230 lb (104.327 kg)     Height 04/16/15 1244 '5\' 7"'$  (1.702 m)     Head Cir --      Peak Flow --      Pain Score 04/16/15 1224 0     Pain Loc --      Pain Edu? --      Excl. in [place]? --     Constitutional: Alert and oriented. Well appearing and in no distress. Eyes: Normal exam ENT   Head: Normocephalic and atraumatic.   Mouth/Throat: Mucous membranes are moist. Cardiovascular: Normal rate, regular rhythm. No murmur Respiratory: Normal respiratory effort without tachypnea nor retractions. Breath sounds are clear  Gastrointestinal: Soft and nontender. No distention.   Musculoskeletal: Moderate left shoulder  tenderness to palpation, somewhat worse with range of motion. No axilla tenderness. No obvious lymphadenopathy. 2+ radial pulse. No observable edema noted. Neurologic:  Normal speech and language. No gross focal neurologic d. Equal grip strengths bilaterally. No pronator drift. Intact finger to nose testing. Cranial nerves intact. Skin:  Skin is warm, dry and intact.  Psychiatric: Mood and affect are normal. Speech and behavior are normal.   ____________________________________________  RADIOLOGY  Ultrasound negative for DVT CT head within normal limits   EKG reviewed and interpreted by myself shows normal sinus rhythm at 91 bpm, narrow QRS, normal axis, normal intervals, no ST changes. Normal EKG.  ____________________________________________    INITIAL IMPRESSION / ASSESSMENT AND PLAN / ED COURSE  Pertinent labs & imaging results that were available during my care of the patient were reviewed by me and considered in my medical decision making (see chart for details).  Patient presents the emergency department with left arm discomfort, and swelling. No swelling seen on examination. The arm itself is nontender to palpation but the patient does have tenderness over the left shoulder muscles. States the pain is worse with movement of the arm. Highly suspect musculoskeletal pain. However given 2 weeks of discomfort I believe it is prudent to rule out DVT with an ultrasound. Patient is agreeable to plan. Patient also states she has felt a feeling of heaviness intermittently in the left side of her face, was reading a pamphlet yesterday on stroke and was concerned that this could be a sign of stroke. Patient has an intact and normal neurologic exam today. No deficits identified on examination. We'll obtain a CT scan to help evaluate for subacute CVA, although clinical suspicion is extremely low.  CT is negative. Ultrasound negative. I discussed with the patient to wear her arm sleeve, take  ibuprofen/Motrin for pain relief and follow up with her primary care physician in one to 2 days for recheck/reevaluation. Patient is agreeable to plan. Suspect likely musculoskeletal pain.  ____________________________________________   FINAL CLINICAL IMPRESSION(S) / ED DIAGNOSES  left arm discomfort   Harvest Dark, MD 04/16/15 1811

## 2015-04-17 ENCOUNTER — Ambulatory Visit (INDEPENDENT_AMBULATORY_CARE_PROVIDER_SITE_OTHER): Payer: 59 | Admitting: Family Medicine

## 2015-04-17 ENCOUNTER — Encounter: Payer: Self-pay | Admitting: Family Medicine

## 2015-04-17 VITALS — BP 158/84 | HR 91 | Temp 98.4°F | Resp 16 | Ht 67.0 in | Wt 234.0 lb

## 2015-04-17 DIAGNOSIS — M5412 Radiculopathy, cervical region: Secondary | ICD-10-CM

## 2015-04-17 DIAGNOSIS — E890 Postprocedural hypothyroidism: Secondary | ICD-10-CM | POA: Insufficient documentation

## 2015-04-17 DIAGNOSIS — E669 Obesity, unspecified: Secondary | ICD-10-CM

## 2015-04-17 DIAGNOSIS — N951 Menopausal and female climacteric states: Secondary | ICD-10-CM | POA: Diagnosis not present

## 2015-04-17 DIAGNOSIS — Z9009 Acquired absence of other part of head and neck: Secondary | ICD-10-CM | POA: Insufficient documentation

## 2015-04-17 DIAGNOSIS — C50912 Malignant neoplasm of unspecified site of left female breast: Secondary | ICD-10-CM | POA: Diagnosis not present

## 2015-04-17 DIAGNOSIS — E559 Vitamin D deficiency, unspecified: Secondary | ICD-10-CM

## 2015-04-17 DIAGNOSIS — R232 Flushing: Secondary | ICD-10-CM | POA: Insufficient documentation

## 2015-04-17 DIAGNOSIS — I10 Essential (primary) hypertension: Secondary | ICD-10-CM | POA: Insufficient documentation

## 2015-04-17 MED ORDER — NAPROXEN 500 MG PO TABS: 500.0000 mg | ORAL_TABLET | Freq: Two times a day (BID) | ORAL | Status: DC

## 2015-04-17 MED ORDER — LISINOPRIL-HYDROCHLOROTHIAZIDE 20-25 MG PO TABS: 1.0000 | ORAL_TABLET | Freq: Every day | ORAL | Status: DC

## 2015-04-17 NOTE — Patient Instructions (Addendum)
Stop calcium and aspirin. Take Naprosyn twice daily for two weeks. Increase lisinopril/HCTZ to 20/25 daily (double current dose).

## 2015-04-18 LAB — VITAMIN D 25 HYDROXY (VIT D DEFICIENCY, FRACTURES): Vit D, 25-Hydroxy: 12.2 ng/mL — ABNORMAL LOW (ref 30.0–100.0)

## 2015-04-18 LAB — VITAMIN B12: VITAMIN B 12: 650 pg/mL (ref 211–946)

## 2015-04-18 LAB — TSH: TSH: 2.74 u[IU]/mL (ref 0.450–4.500)

## 2015-04-19 DIAGNOSIS — E559 Vitamin D deficiency, unspecified: Secondary | ICD-10-CM | POA: Insufficient documentation

## 2015-04-19 MED ORDER — VITAMIN D 50 MCG (2000 UT) PO CAPS: 1.0000 | ORAL_CAPSULE | Freq: Every day | ORAL | Status: DC

## 2015-04-19 NOTE — Progress Notes (Signed)
Date:  04/17/2015   Name:  Heather Dudley   DOB:  Mar 07, 1960   MRN:  [id number]  PCP:  No primary care provider on file.    Chief Complaint: Establish Care   History of Present Illness:  This is a 55 y.o. female to establish care. C/o LUE pain past 2 wks extending into 4th/5th fingers, associated with L neck, shoulder, and elbow pain and tender L medial upper arm. Seen in ED yest, LUE [place] negative for DVT and head CT normal, told likely musculoskeletal. No known injury or unusual use. Also having occ L facial pain and can't feel teeth. Advil seems to help but wears off. Hx BCC, on Effexor and omega-3 supp for hot flashes, s/p partial thyroidectomy 23 yrs ago. Hx L lumpectomy with LN resection 2012, on Arimidex.  Review of Systems:  Review of Systems  Constitutional: Negative for fever.  HENT: Negative for ear pain and sore throat.   Eyes: Negative for pain.  Respiratory: Negative for cough and shortness of breath.   Cardiovascular: Negative for chest pain and leg swelling.  Gastrointestinal: Negative for abdominal pain.  Endocrine: Negative for polyuria.  Genitourinary: Negative for difficulty urinating.  Neurological: Negative for syncope and light-headedness.    Patient Active Problem List   Diagnosis Date Noted  . Cervical radiculopathy 04/17/2015  . Hypertension 04/17/2015  . Hot flashes 04/17/2015  . History of partial thyroidectomy 04/17/2015  . Obesity, Class II, BMI 35-39.9 04/17/2015  . Lipoma of axilla 10/24/2014  . Breast cancer, left breast ([place]) 09/02/2010    Prior to Admission medications   Medication Sig Start Date End Date Taking? Authorizing Provider  anastrozole (ARIMIDEX) 1 MG tablet Take 1 mg by mouth daily. Reported on 04/17/2015   Yes Historical Provider, MD  Multiple Vitamins-Minerals (MULTIVITAMIN PO) Take by mouth daily. Reported on 04/17/2015   Yes Historical Provider, MD  Omega 3 1000 MG CAPS Take 1 capsule by mouth daily.   Yes Historical  Provider, MD  venlafaxine XR (EFFEXOR-XR) 150 MG 24 hr capsule Take 1 capsule (150 mg total) by mouth daily with breakfast. 11/09/14  Yes Leia Alf, MD  lisinopril-hydrochlorothiazide (PRINZIDE,ZESTORETIC) 20-25 MG tablet Take 1 tablet by mouth daily. 04/17/15   Adline Potter, MD  naproxen (NAPROSYN) 500 MG tablet Take 1 tablet (500 mg total) by mouth 2 (two) times daily with a meal. 04/17/15   Adline Potter, MD    Allergies  Allergen Reactions  . Penicillins Rash    Past Surgical History  Procedure Laterality Date  . Tubal ligation    . Tonsillectomy    . Birth mark removed    . Breast lumpectomy  2012    left breast  . Abdominal hysterectomy  2011  . Thyroid surgery      partially removed  . Colonoscopy  July 29, 2012    normal study.  . Breast excisional biopsy  August 05, 2010    left breast positive 07/2010  . Breast cyst aspiration Right     negative 01/2010    Social History  Substance Use Topics  . Smoking status: Never Smoker   . Smokeless tobacco: Never Used  . Alcohol Use: No    Family History  Problem Relation Age of Onset  . Breast cancer Other   . Colon cancer Other   . Ovarian cancer Other     Medication list has been reviewed and updated.  Physical Examination: BP 158/84 mmHg  Pulse 91  Temp(Src) 98.4 F (36.9  C) (Oral)  Resp 16  Ht 5\' 7"  (1.702 m)  Wt 234 lb (106.142 kg)  BMI 36.64 kg/m2  SpO2 100%  LMP 04/01/2008  Physical Exam  Constitutional: She is oriented to person, place, and time. She appears well-developed and well-nourished.  HENT:  Head: Normocephalic and atraumatic.  Right Ear: External ear normal.  Left Ear: External ear normal.  Nose: Nose normal.  Mouth/Throat: Oropharynx is clear and moist.  TM's clear  Eyes: Conjunctivae and EOM are normal. Pupils are equal, round, and reactive to light.  Neck: Neck supple. No thyromegaly present.  Cardiovascular: Normal rate, regular rhythm and normal heart sounds.    Pulmonary/Chest: Effort normal and breath sounds normal.  Abdominal: Soft. She exhibits no distension and no mass. There is no tenderness.  Musculoskeletal: She exhibits no edema.  No cervical tenderness, mild interscapular and L posterior shoulder tenderness, full ROM L shoulder, no elbow or wrist tenderness, no gross motor or sensory deficits  Lymphadenopathy:    She has no cervical adenopathy.  Neurological: She is alert and oriented to person, place, and time. No cranial nerve deficit. Coordination normal.  Skin: Skin is warm and dry.  Psychiatric: She has a normal mood and affect. Her behavior is normal.  Nursing note and vitals reviewed.   Assessment and Plan:  1. Cervical radiculopathy Trial Naprosyn 500 mg bid x 2wks, consider MRI if sxs persist - B12  2. Essential hypertension Marginal control on Prinzide, increase to 20/25 daily  3. Hot flashes Well controlled on Effexor/omega-3 supplement  4. Malignant neoplasm of left female breast, unspecified site of breast [facility]) S/p lumpectomy 2012 on Arimidex  5. History of partial thyroidectomy - TSH  6. Obesity, Class II, BMI 35-39.9 - Vitamin D (25 hydroxy)  7. Med review Stop calcium and asa  Return in about 4 weeks (around 05/15/2015).  Satira Anis. [facility]  04/19/2015

## 2015-04-19 NOTE — Addendum Note (Signed)
Addended by: Adline Potter on: 04/19/2015 01:22 PM   Modules accepted: Orders

## 2015-05-08 ENCOUNTER — Telehealth: Payer: Self-pay

## 2015-05-08 NOTE — Telephone Encounter (Signed)
I have been calling this patient and I have left detailed message and mailed this.

## 2015-05-15 ENCOUNTER — Encounter: Payer: Self-pay | Admitting: Family Medicine

## 2015-05-15 ENCOUNTER — Ambulatory Visit (INDEPENDENT_AMBULATORY_CARE_PROVIDER_SITE_OTHER): Payer: 59 | Admitting: Family Medicine

## 2015-05-15 VITALS — BP 138/80 | HR 98 | Resp 16 | Ht 67.0 in | Wt 237.0 lb

## 2015-05-15 DIAGNOSIS — M5412 Radiculopathy, cervical region: Secondary | ICD-10-CM

## 2015-05-15 DIAGNOSIS — N951 Menopausal and female climacteric states: Secondary | ICD-10-CM

## 2015-05-15 DIAGNOSIS — I10 Essential (primary) hypertension: Secondary | ICD-10-CM | POA: Diagnosis not present

## 2015-05-15 DIAGNOSIS — E669 Obesity, unspecified: Secondary | ICD-10-CM | POA: Diagnosis not present

## 2015-05-15 DIAGNOSIS — R232 Flushing: Secondary | ICD-10-CM

## 2015-05-15 DIAGNOSIS — E559 Vitamin D deficiency, unspecified: Secondary | ICD-10-CM

## 2015-05-17 NOTE — Progress Notes (Signed)
Date:  05/15/2015   Name:  Heather Dudley   DOB:  1960/04/25   MRN:  [id number]  PCP:  No primary care provider on file.    Chief Complaint: Hypertension   History of Present Illness:  This is a 55 y.o. female for one month f/u. Cervical radiculopathy sxs resolved on Naprosyn. BP improved on increased Prinzide. Hot flashes stable on Effexor/omega-3 supplement. Blood work showed low vit D, on supplement. Also taking B complex and MVI. No new concerns.  Review of Systems:  Review of Systems  Constitutional: Negative for fever and fatigue.  Respiratory: Negative for cough and shortness of breath.   Cardiovascular: Negative for chest pain and leg swelling.  Endocrine: Negative for polyuria.  Genitourinary: Negative for difficulty urinating.  Neurological: Negative for syncope and light-headedness.    Patient Active Problem List   Diagnosis Date Noted  . Vitamin D deficiency 04/19/2015  . Cervical radiculopathy 04/17/2015  . Hypertension 04/17/2015  . Hot flashes 04/17/2015  . History of partial thyroidectomy 04/17/2015  . Obesity, Class II, BMI 35-39.9 04/17/2015  . Lipoma of axilla 10/24/2014  . Breast cancer, left breast ([place]) 09/02/2010    Prior to Admission medications   Medication Sig Start Date End Date Taking? Authorizing Provider  anastrozole (ARIMIDEX) 1 MG tablet Take 1 mg by mouth daily. Reported on 04/17/2015   Yes Historical Provider, MD  Cholecalciferol (VITAMIN D) 2000 units CAPS Take 1 capsule (2,000 Units total) by mouth daily. 04/19/15  Yes Adline Potter, MD  lisinopril-hydrochlorothiazide (PRINZIDE,ZESTORETIC) 20-25 MG tablet Take 1 tablet by mouth daily. 04/17/15  Yes Adline Potter, MD  Multiple Vitamins-Minerals (MULTIVITAMIN PO) Take by mouth daily. Reported on 05/15/2015   Yes Historical Provider, MD  Omega 3 1000 MG CAPS Take 1 capsule by mouth daily.   Yes Historical Provider, MD  venlafaxine XR (EFFEXOR-XR) 150 MG 24 hr capsule Take 1 capsule (150 mg  total) by mouth daily with breakfast. 11/09/14  Yes Leia Alf, MD    Allergies  Allergen Reactions  . Penicillins Rash    Past Surgical History  Procedure Laterality Date  . Tubal ligation    . Tonsillectomy    . Birth mark removed    . Breast lumpectomy  2012    left breast  . Abdominal hysterectomy  2011  . Thyroid surgery      partially removed  . Colonoscopy  July 29, 2012    normal study.  . Breast excisional biopsy  August 05, 2010    left breast positive 07/2010  . Breast cyst aspiration Right     negative 01/2010    Social History  Substance Use Topics  . Smoking status: Never Smoker   . Smokeless tobacco: Never Used  . Alcohol Use: No    Family History  Problem Relation Age of Onset  . Breast cancer Other   . Colon cancer Other   . Ovarian cancer Other     Medication list has been reviewed and updated.  Physical Examination: BP 138/80 mmHg  Pulse 98  Resp 16  Ht 5\' 7"  (1.702 m)  Wt 237 lb (107.502 kg)  BMI 37.11 kg/m2  SpO2 100%  LMP 04/01/2008  Physical Exam  Constitutional: She appears well-developed and well-nourished.  Cardiovascular: Normal rate, regular rhythm and normal heart sounds.   Pulmonary/Chest: Effort normal and breath sounds normal.  Musculoskeletal: She exhibits no edema.  Neurological: She is alert.  Skin: Skin is warm and dry.  Psychiatric: She has a  normal mood and affect. Her behavior is normal.  Nursing note and vitals reviewed.   Assessment and Plan:  1. Essential hypertension Improved on increased Prinzide  2. Vitamin D deficiency On supplement, recheck level next visit  3. Hot flashes Well controlled on Effexor/omega-3 supplement  4. Obesity, Class II, BMI 35-39.9 Exercise/weight loss discussed  5. Cervical radiculopathy Resolved s/p Naprosyn  Return in about 3 months (around 08/15/2015).  Satira Anis. [facility]  05/17/2015

## 2015-05-28 ENCOUNTER — Ambulatory Visit: Payer: 59 | Attending: Family Medicine

## 2015-05-29 ENCOUNTER — Other Ambulatory Visit: Payer: Self-pay | Admitting: *Deleted

## 2015-05-29 MED ORDER — VENLAFAXINE HCL ER 150 MG PO CP24: 150.0000 mg | ORAL_CAPSULE | Freq: Every day | ORAL | Status: DC

## 2015-06-05 ENCOUNTER — Other Ambulatory Visit: Payer: Self-pay

## 2015-06-05 ENCOUNTER — Other Ambulatory Visit: Payer: 59

## 2015-06-05 ENCOUNTER — Ambulatory Visit: Payer: 59 | Admitting: Hematology and Oncology

## 2015-06-05 DIAGNOSIS — C50912 Malignant neoplasm of unspecified site of left female breast: Secondary | ICD-10-CM

## 2015-06-26 ENCOUNTER — Ambulatory Visit: Payer: 59 | Admitting: Hematology and Oncology

## 2015-06-26 ENCOUNTER — Other Ambulatory Visit: Payer: 59

## 2015-07-24 ENCOUNTER — Other Ambulatory Visit: Payer: Self-pay

## 2015-07-24 MED ORDER — LISINOPRIL-HYDROCHLOROTHIAZIDE 20-25 MG PO TABS: 1.0000 | ORAL_TABLET | Freq: Every day | ORAL | Status: DC

## 2015-07-24 NOTE — Telephone Encounter (Signed)
Sent Electronic refill

## 2015-08-21 ENCOUNTER — Ambulatory Visit (INDEPENDENT_AMBULATORY_CARE_PROVIDER_SITE_OTHER): Payer: 59 | Admitting: Family Medicine

## 2015-08-21 ENCOUNTER — Encounter: Payer: Self-pay | Admitting: Family Medicine

## 2015-08-21 VITALS — BP 124/82 | HR 92 | Temp 98.7°F | Resp 16 | Ht 67.0 in | Wt 240.0 lb

## 2015-08-21 DIAGNOSIS — E559 Vitamin D deficiency, unspecified: Secondary | ICD-10-CM | POA: Diagnosis not present

## 2015-08-21 DIAGNOSIS — J40 Bronchitis, not specified as acute or chronic: Secondary | ICD-10-CM

## 2015-08-21 DIAGNOSIS — I10 Essential (primary) hypertension: Secondary | ICD-10-CM

## 2015-08-21 DIAGNOSIS — E669 Obesity, unspecified: Secondary | ICD-10-CM | POA: Diagnosis not present

## 2015-08-21 MED ORDER — AZITHROMYCIN 250 MG PO TABS: ORAL_TABLET | ORAL | Status: DC

## 2015-08-22 LAB — BASIC METABOLIC PANEL
BUN / CREAT RATIO: 13 (ref 9–23)
BUN: 13 mg/dL (ref 6–24)
CHLORIDE: 98 mmol/L (ref 96–106)
CO2: 29 mmol/L (ref 18–29)
Calcium: 9.6 mg/dL (ref 8.7–10.2)
Creatinine, Ser: 0.98 mg/dL (ref 0.57–1.00)
GFR calc Af Amer: 76 mL/min/{1.73_m2} (ref >59)
GFR calc non Af Amer: 66 mL/min/{1.73_m2} (ref >59)
GLUCOSE: 89 mg/dL (ref 65–99)
POTASSIUM: 3.9 mmol/L (ref 3.5–5.2)
SODIUM: 139 mmol/L (ref 134–144)

## 2015-08-22 LAB — VITAMIN D 25 HYDROXY (VIT D DEFICIENCY, FRACTURES): VIT D 25 HYDROXY: 30.5 ng/mL (ref 30.0–100.0)

## 2015-08-22 NOTE — Progress Notes (Signed)
Date:  08/21/2015   Name:  Heather Dudley   DOB:  [birth year]   MRN:  [id number]  PCP:  No primary care provider on file.    Chief Complaint: Hypertension   History of Present Illness:  This is a 55 y.o. female seen in urgent care last week for sore throat, cough, and congestion, placed on Amox/Tessalon/lidocaine, not feeling much better. HTN on Prinzide, taking vit D 1200 units daily, weight up 3#, has not yet discussed hot flashes with oncology.  Review of Systems:  Review of Systems  Constitutional: Negative for fever.  Respiratory: Negative for shortness of breath.   Cardiovascular: Negative for chest pain and leg swelling.  Neurological: Negative for syncope and light-headedness.    Patient Active Problem List   Diagnosis Date Noted  . Vitamin D deficiency 04/19/2015  . Cervical radiculopathy 04/17/2015  . Hypertension 04/17/2015  . Hot flashes 04/17/2015  . History of partial thyroidectomy 04/17/2015  . Obesity, Class II, BMI 35-39.9 04/17/2015  . Lipoma of axilla 10/24/2014  . Breast cancer, left breast ([place]) 09/02/2010    Prior to Admission medications   Medication Sig Start Date End Date Taking? Authorizing Provider  anastrozole (ARIMIDEX) 1 MG tablet Take 1 mg by mouth daily. Reported on 04/17/2015   Yes Historical Provider, MD  b complex vitamins tablet Take 1 tablet by mouth daily.   Yes Historical Provider, MD  benzonatate (TESSALON) 100 MG capsule TAKE 1-2 CAPSULE BY ORAL ROUTE 3 TIMES EVERY DAY AS NEEDED FOR COUGH 08/12/15  Yes Historical Provider, MD  Cholecalciferol (VITAMIN D) 2000 units CAPS Take 1 capsule (2,000 Units total) by mouth daily. 04/19/15  Yes Adline Potter, MD  lidocaine (XYLOCAINE) 2 % solution TAKE 15 MILLILITER BY ORAL ROUTE EVERY 6 HOURS AND SWISH AND SPIT OUT 08/12/15  Yes Historical Provider, MD  lisinopril-hydrochlorothiazide (PRINZIDE,ZESTORETIC) 20-25 MG tablet Take 1 tablet by mouth daily. 07/24/15  Yes Adline Potter, MD  Multiple  Vitamins-Minerals (MULTIVITAMIN PO) Take by mouth daily. Reported on 05/15/2015   Yes Historical Provider, MD  Omega 3 1000 MG CAPS Take 1 capsule by mouth daily.   Yes Historical Provider, MD  venlafaxine XR (EFFEXOR-XR) 150 MG 24 hr capsule Take 1 capsule (150 mg total) by mouth daily with breakfast. 05/29/15  Yes Lequita Asal, MD  azithromycin (ZITHROMAX) 250 MG tablet Take two tablets today then one tablet daily for four days. 08/21/15   Adline Potter, MD    Allergies  Allergen Reactions  . Penicillins Rash    Past Surgical History  Procedure Laterality Date  . Tubal ligation    . Tonsillectomy    . Birth mark removed    . Breast lumpectomy  2012    left breast  . Abdominal hysterectomy  2011  . Thyroid surgery      partially removed  . Colonoscopy  July 29, 2012    normal study.  . Breast excisional biopsy  August 05, 2010    left breast positive 07/2010  . Breast cyst aspiration Right     negative 01/2010    Social History  Substance Use Topics  . Smoking status: Never Smoker   . Smokeless tobacco: Never Used  . Alcohol Use: No    Family History  Problem Relation Age of Onset  . Breast cancer Other   . Colon cancer Other   . Ovarian cancer Other     Medication list has been reviewed and updated.  Physical Examination: BP 124/82 mmHg  Pulse 92  Temp(Src) 98.7 F (37.1 C)  Resp 16  Ht 5\' 7"  (1.702 m)  Wt 240 lb (108.863 kg)  BMI 37.58 kg/m2  SpO2 100%  LMP 04/01/2008  Physical Exam  Constitutional: She appears well-developed and well-nourished.  HENT:  Mouth/Throat: Oropharynx is clear and moist.  Cardiovascular: Normal rate, regular rhythm and normal heart sounds.   Pulmonary/Chest: Effort normal and breath sounds normal.  Musculoskeletal: She exhibits no edema.  Lymphadenopathy:    She has no cervical adenopathy.  Neurological: She is alert.  Skin: Skin is warm and dry.  Psychiatric: She has a normal mood and affect. Her behavior is normal.   Nursing note and vitals reviewed.   Assessment and Plan:  1. Bronchitis, persistent Change Amox to Zpak  2. Vitamin D deficiency On supplement x 3 months - Vitamin D (25 hydroxy)  3. Essential hypertension Well controlled on increased Prinzide - Basic Metabolic Panel (BMET)  4. Obesity, Class II, BMI 35-39.9 Exercise/weight loss discussed  Return in about 6 months (around 02/20/2016), or if symptoms worsen or fail to improve.  Satira Anis. [facility]  08/22/2015

## 2015-09-04 ENCOUNTER — Inpatient Hospital Stay: Payer: 59 | Attending: Hematology and Oncology

## 2015-09-04 ENCOUNTER — Encounter: Payer: Self-pay | Admitting: Hematology and Oncology

## 2015-09-04 ENCOUNTER — Inpatient Hospital Stay (HOSPITAL_BASED_OUTPATIENT_CLINIC_OR_DEPARTMENT_OTHER): Payer: 59 | Admitting: Hematology and Oncology

## 2015-09-04 VITALS — BP 121/81 | HR 86 | Temp 98.1°F | Resp 18 | Wt 239.8 lb

## 2015-09-04 DIAGNOSIS — Z923 Personal history of irradiation: Secondary | ICD-10-CM | POA: Diagnosis not present

## 2015-09-04 DIAGNOSIS — Z801 Family history of malignant neoplasm of trachea, bronchus and lung: Secondary | ICD-10-CM

## 2015-09-04 DIAGNOSIS — Z170 Estrogen receptor positive status [ER+]: Secondary | ICD-10-CM | POA: Insufficient documentation

## 2015-09-04 DIAGNOSIS — C50912 Malignant neoplasm of unspecified site of left female breast: Secondary | ICD-10-CM

## 2015-09-04 DIAGNOSIS — Z803 Family history of malignant neoplasm of breast: Secondary | ICD-10-CM | POA: Insufficient documentation

## 2015-09-04 DIAGNOSIS — I10 Essential (primary) hypertension: Secondary | ICD-10-CM

## 2015-09-04 DIAGNOSIS — Z79899 Other long term (current) drug therapy: Secondary | ICD-10-CM | POA: Diagnosis not present

## 2015-09-04 DIAGNOSIS — Z9221 Personal history of antineoplastic chemotherapy: Secondary | ICD-10-CM

## 2015-09-04 DIAGNOSIS — C50412 Malignant neoplasm of upper-outer quadrant of left female breast: Secondary | ICD-10-CM | POA: Insufficient documentation

## 2015-09-04 DIAGNOSIS — E041 Nontoxic single thyroid nodule: Secondary | ICD-10-CM | POA: Insufficient documentation

## 2015-09-04 DIAGNOSIS — K469 Unspecified abdominal hernia without obstruction or gangrene: Secondary | ICD-10-CM

## 2015-09-04 DIAGNOSIS — E669 Obesity, unspecified: Secondary | ICD-10-CM | POA: Insufficient documentation

## 2015-09-04 DIAGNOSIS — Z85828 Personal history of other malignant neoplasm of skin: Secondary | ICD-10-CM | POA: Diagnosis not present

## 2015-09-04 DIAGNOSIS — R232 Flushing: Secondary | ICD-10-CM | POA: Insufficient documentation

## 2015-09-04 DIAGNOSIS — C779 Secondary and unspecified malignant neoplasm of lymph node, unspecified: Secondary | ICD-10-CM | POA: Diagnosis not present

## 2015-09-04 DIAGNOSIS — T451X5A Adverse effect of antineoplastic and immunosuppressive drugs, initial encounter: Secondary | ICD-10-CM

## 2015-09-04 DIAGNOSIS — Z8041 Family history of malignant neoplasm of ovary: Secondary | ICD-10-CM | POA: Diagnosis not present

## 2015-09-04 DIAGNOSIS — L989 Disorder of the skin and subcutaneous tissue, unspecified: Secondary | ICD-10-CM

## 2015-09-04 LAB — CBC WITH DIFFERENTIAL/PLATELET
Basophils Absolute: 0.1 10*3/uL (ref 0–0.1)
Basophils Relative: 1 %
Eosinophils Absolute: 0.2 10*3/uL (ref 0–0.7)
Eosinophils Relative: 2 %
HCT: 38.1 % (ref 35.0–47.0)
Hemoglobin: 12.5 g/dL (ref 12.0–16.0)
Lymphocytes Relative: 35 %
Lymphs Abs: 2.9 10*3/uL (ref 1.0–3.6)
MCH: 28.1 pg (ref 26.0–34.0)
MCHC: 32.9 g/dL (ref 32.0–36.0)
MCV: 85.6 fL (ref 80.0–100.0)
Monocytes Absolute: 0.6 10*3/uL (ref 0.2–0.9)
Monocytes Relative: 7 %
Neutro Abs: 4.6 10*3/uL (ref 1.4–6.5)
Neutrophils Relative %: 55 %
Platelets: 285 10*3/uL (ref 150–440)
RBC: 4.45 MIL/uL (ref 3.80–5.20)
RDW: 14 % (ref 11.5–14.5)
WBC: 8.3 10*3/uL (ref 3.6–11.0)

## 2015-09-04 LAB — COMPREHENSIVE METABOLIC PANEL
ALT: 15 U/L (ref 14–54)
AST: 16 U/L (ref 15–41)
Albumin: 3.9 g/dL (ref 3.5–5.0)
Alkaline Phosphatase: 77 U/L (ref 38–126)
Anion gap: 5 (ref 5–15)
BUN: 15 mg/dL (ref 6–20)
CO2: 33 mmol/L — ABNORMAL HIGH (ref 22–32)
Calcium: 9.4 mg/dL (ref 8.9–10.3)
Chloride: 99 mmol/L — ABNORMAL LOW (ref 101–111)
Creatinine, Ser: 1.03 mg/dL — ABNORMAL HIGH (ref 0.44–1.00)
GFR calc Af Amer: >60 mL/min (ref >60)
GFR calc non Af Amer: >60 mL/min (ref >60)
Glucose, Bld: 104 mg/dL — ABNORMAL HIGH (ref 65–99)
Potassium: 3.8 mmol/L (ref 3.5–5.1)
Sodium: 137 mmol/L (ref 135–145)
Total Bilirubin: 0.7 mg/dL (ref 0.3–1.2)
Total Protein: 7.7 g/dL (ref 6.5–8.1)

## 2015-09-04 MED ORDER — LETROZOLE 2.5 MG PO TABS: 2.5000 mg | ORAL_TABLET | Freq: Every day | ORAL | Status: DC

## 2015-09-04 NOTE — Progress Notes (Signed)
Patient is here for follow up, patient notes cramping in the left breast area where she had lumpectomy. She thought maybe it was heart related but denies any SOB, no left arm pain and no sweating or jaw pain. The cramping lasts 3 to 4 minutes. She will lay down when she gets this sensation. She first noticed this about a year ago, recently happening a little more frequent. Would like you to check a spot on her frontal part of scalp.

## 2015-09-04 NOTE — Progress Notes (Signed)
Patient was seen today at [facility] location for appointment with Dr. Nolon Stalls.  Patient is in follow up for NSABP B-47 (Protocol B-47 A Randomized Phase III Trial of Adjuvant Therapy Comparing Chemotherapy Alone (Six Cycles of Docetaxel Plus Cyclophosphamide or Four Cycles of Doxorubicin Plus Cyclophosphamide Followed by Weekly Paclitaxel) to Chemotherapy Plus Trastuzumab in Women with Node-Positive or High-Risk Node-Negative HER2-Low Invasive Breast Cancer) research study.  Patient was due to complete 60 month post randomization Medical Conditions and Lifestyle Questionnaire Follow up.  I spoke to physician's nurse, Moishe Spice and receptionist Leighton Ruff at [facility] location and they agreed for me to fax form to [place] (from [place] location).  They delivered questionnaire to patient, she completed form and they faxed completed form back to me in [place].  Completed questionnaire faxed to NSABP as required to completed study follow up requirements.

## 2015-09-04 NOTE — Progress Notes (Signed)
[facility] day:  09/04/2015  Chief Complaint: Heather Dudley is a 55 y.o. female with stage IIA left breast cancer who is seen for reassessment.  HPI:  The patient was diagnosed in 2012 with left breast cancer following a screening mammogram.  She underwent biopsy followed by wide excision and axillary lymph node dissection on 09/03/2010 by Dr. Bary Castilla.  Pathology revealed a 1.0 cm grade II invasive ductal carcinoma. One of 14 lymph nodes were positive for macrometastasis of 1.3 cm. Tumor was ER positive (90%), PR positive (20%), and HER-2/neu negative by FISH (Her-2/CEP17 ratio < 1.8).  She enrolled on NSABP B-47. She received AC 4 followed by weekly paclitaxel (completed on 02/06/2011).  She received breast radiation in 03/2011.  She completed a year of adjuvant Herceptin on 11/13/2011. She began Arimidex in 07/2011.  The patient was last seen the medical oncology clinic on 12/20/2014 by Magda Paganini Herring,NP. She was tolerating her Arimidex well.  She had intermittent hot flashes and was on Effexor.   Bilateral mammogram on 12/10/2014 revealed lumpectomy changes in the left breast without evidence of malignancy.  Bone density study on 05/25/2013 was normal with a T score of -0.4 and the left femoral neck.  She states that about a month ago she stopped both her Arimidex and Effexor. She states that she was taking Effexor XR 75 mg a day.  She states that her hot flashes were severe. She could not sleep.  She describes feeling always clammy and not well. Her "head was in a turmoil".  She describes her head being on fire and disoriented. She had pains in her knees and shoulders.  Symptomatically, she is feeling much better.  She now has a deep sleep at night.  She notes a history of a birthmark and a basal cell carcinoma of scalp more than 20 years ago.  She feels like the are has grown and is "puffy".  She denies any pain.    Past Medical History   Diagnosis Date  . Cellulitis and abscess of trunk   . Hypertension   . 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 Thyroid nodule   . Hernia   . Personal history of malignant neoplasm of breast     The patient underwent wide excision, mastoplasty. Dissection for a T1b, N1, M0 carcinoma left breast on September 03, 2010.The primary tumor was 1 cm in diameter, and a single positive axillary node 1.3 cm in diameter. This was an ER-positive, PR slightly positive, HER-2/neu not overexpressing tumor.  The patient completed whole breast radiation in February 2013.  . Breast screening, unspecified   . Lump or mass in breast   . Obesity, unspecified   . History of chemotherapy 2012  . Screening for obesity   . Malignant neoplasm of breast (female), unspecified site     She underwent wide excision, mastoplasty and axillary dissection for her left breast malignancy on September 03, 2010. The primary tumor was 1 cm in diameter, and a single positive axillary node 1.3 cm in diameter. This was an ER-positive, PR slightly positive, HER-2/neu not overexpressing tumor. She tolerated her whole breast radiation without difficulty ending in late February 2013.  [name] Basal cell carcinoma of forehead     birthmark of forehead  . Malignant neoplasm of upper-outer quadrant of female breast [facility])     Past Surgical History  Procedure Laterality Date  . Tubal ligation    .  Tonsillectomy    . Birth mark removed    . Breast lumpectomy  2012    left breast  . Abdominal hysterectomy  2011  . Thyroid surgery      partially removed  . Colonoscopy  July 29, 2012    normal study.  . Breast excisional biopsy  August 05, 2010    left breast positive 07/2010  . Breast cyst aspiration Right     negative 01/2010    Family History  Problem Relation Age of Onset  . Breast cancer Other   . Colon cancer Other   . Ovarian cancer Other     Social History:  reports that she has never smoked. She has never  used smokeless tobacco. She reports that she does not drink alcohol or use illicit drugs.  She is from [place].  The patient is alone today.  Allergies:  Allergies  Allergen Reactions  . Penicillins Rash    Current Medications: Current Outpatient Prescriptions  Medication Sig Dispense Refill  . anastrozole (ARIMIDEX) 1 MG tablet Take 1 mg by mouth daily. Reported on 04/17/2015    . b complex vitamins tablet Take 1 tablet by mouth daily.    . Cholecalciferol (VITAMIN D) 2000 units CAPS Take 1 capsule (2,000 Units total) by mouth daily. 30 capsule   . lisinopril-hydrochlorothiazide (PRINZIDE,ZESTORETIC) 20-25 MG tablet Take 1 tablet by mouth daily. 30 tablet 2  . Multiple Vitamins-Minerals (MULTIVITAMIN PO) Take by mouth daily. Reported on 05/15/2015    . Omega 3 1000 MG CAPS Take 1 capsule by mouth daily.    . benzonatate (TESSALON) 100 MG capsule Reported on 09/04/2015  0  . lidocaine (XYLOCAINE) 2 % solution Reported on 09/04/2015  0  . venlafaxine XR (EFFEXOR-XR) 150 MG 24 hr capsule Take 1 capsule (150 mg total) by mouth daily with breakfast. (Patient not taking: Reported on 09/04/2015) 30 capsule 0   No current facility-administered medications for this visit.    Review of Systems:  GENERAL:  Feels good.  Active.  No fevers or sweats.  Weight up 12 pounds. PERFORMANCE STATUS (ECOG):  0 HEENT:  No visual changes, runny nose, sore throat, mouth sores or tenderness. Lungs: No shortness of breath or cough.  No hemoptysis. Cardiac:  No chest pain, palpitations, orthopnea, or PND. GI:  No nausea, vomiting, diarrhea, constipation, melena or hematochezia. GU:  No urgency, frequency, dysuria, or hematuria. Musculoskeletal:  No back pain.  No joint pain.  No muscle tenderness. Extremities:  No pain or swelling. Skin:  Spot on scalp (see HPI).  No rashes or skin changes. Neuro:  No headache, numbness or weakness, balance or coordination issues. Endocrine:  No diabetes, thyroid issues, hot  flashes or night sweats. Psych:  No mood changes, depression or anxiety. Pain:  No focal pain. Review of systems:  All other systems reviewed and found to be negative.  Physical Exam: Blood pressure 121/81, pulse 86, temperature 98.1 F (36.7 C), temperature source Tympanic, resp. rate 18, weight 239 lb 12 oz (108.75 kg), last menstrual period 04/01/2008. GENERAL:  Well developed, well nourished, heavyset woman sitting comfortably in the exam room in no acute distress. MENTAL STATUS:  Alert and oriented to person, place and time. HEAD:  Short brown hair.  Normocephalic, atraumatic, face symmetric, no Cushingoid features. EYES:  Brown eyes.  Pupils equal round and reactive to light and accomodation.  No conjunctivitis or scleral icterus. ENT:  Oropharynx clear without lesion.  Tongue normal. Mucous membranes moist.  RESPIRATORY:  Clear to auscultation without rales, wheezes or rhonchi. CARDIOVASCULAR:  Regular rate and rhythm without murmur, rub or gallop. BREAST:  Right breast without masses, skin changes or nipple discharge.  Left breast s/p lumpectomy at the 9 o'clock position.  No discrete masses, skin changes or nipple discharge.  ABDOMEN:  Soft, non-tender, with active bowel sounds, and no hepatosplenomegaly.  No masses. SKIN:  Small area of soft fullness at prior basal cell carcinoma removal on scalp.  No rashes, ulcers or lesions. EXTREMITIES: No edema, no skin discoloration or tenderness.  No palpable cords. LYMPH NODES: No palpable cervical, supraclavicular, axillary or inguinal adenopathy  NEUROLOGICAL: Unremarkable. PSYCH:  Appropriate.   Appointment on 09/04/2015  Component Date Value Ref Range Status  . WBC 09/04/2015 8.3  3.6 - 11.0 K/uL Final  . RBC 09/04/2015 4.45  3.80 - 5.20 MIL/uL Final  . Hemoglobin 09/04/2015 12.5  12.0 - 16.0 g/dL Final  . HCT 16/11/9602 38.1  35.0 - 47.0 % Final  . MCV 09/04/2015 85.6  80.0 - 100.0 fL Final  . MCH 09/04/2015 28.1  26.0 - 34.0 pg  Final  . MCHC 09/04/2015 32.9  32.0 - 36.0 g/dL Final  . RDW 54/10/8117 14.0  11.5 - 14.5 % Final  . Platelets 09/04/2015 285  150 - 440 K/uL Final  . Neutrophils Relative % 09/04/2015 55   Final  . Neutro Abs 09/04/2015 4.6  1.4 - 6.5 K/uL Final  . Lymphocytes Relative 09/04/2015 35   Final  . Lymphs Abs 09/04/2015 2.9  1.0 - 3.6 K/uL Final  . Monocytes Relative 09/04/2015 7   Final  . Monocytes Absolute 09/04/2015 0.6  0.2 - 0.9 K/uL Final  . Eosinophils Relative 09/04/2015 2   Final  . Eosinophils Absolute 09/04/2015 0.2  0 - 0.7 K/uL Final  . Basophils Relative 09/04/2015 1   Final  . Basophils Absolute 09/04/2015 0.1  0 - 0.1 K/uL Final  . Sodium 09/04/2015 137  135 - 145 mmol/L Final  . Potassium 09/04/2015 3.8  3.5 - 5.1 mmol/L Final  . Chloride 09/04/2015 99* 101 - 111 mmol/L Final  . CO2 09/04/2015 33* 22 - 32 mmol/L Final  . Glucose, Bld 09/04/2015 104* 65 - 99 mg/dL Final  . BUN 14/78/2956 15  6 - 20 mg/dL Final  . Creatinine, Ser 09/04/2015 1.03* 0.44 - 1.00 mg/dL Final  . Calcium 21/30/8657 9.4  8.9 - 10.3 mg/dL Final  . Total Protein 09/04/2015 7.7  6.5 - 8.1 g/dL Final  . Albumin 84/69/6295 3.9  3.5 - 5.0 g/dL Final  . AST 28/41/3244 16  15 - 41 U/L Final  . ALT 09/04/2015 15  14 - 54 U/L Final  . Alkaline Phosphatase 09/04/2015 77  38 - 126 U/L Final  . Total Bilirubin 09/04/2015 0.7  0.3 - 1.2 mg/dL Final  . GFR calc non Af Amer 09/04/2015 >60  >60 mL/min Final  . GFR calc Af Amer 09/04/2015 >60  >60 mL/min Final   Comment: (NOTE) The eGFR has been calculated using the CKD EPI equation. This calculation has not been validated in all clinical situations. eGFR's persistently <60 mL/min signify possible Chronic Kidney Disease.   . Anion gap 09/04/2015 5  5 - 15 Final    Assessment:  CACHE DECOURSEY is a 55 y.o. female with stage IIA left breast cancer s/p wide excision and axillary lymph node dissection on 09/03/2010.  Pathology revealed a 1.0 cm grade II  invasive ductal carcinoma. One of 14  lymph nodes were positive macrometastasis of 1.3 cm.  Pathologic stage was T1cN1bM0.  Tumor was ER positive (90%), PR positive (20%), and HER-2/neu negative by FISH (Her-2/CEP17 ratio < 1.8).  She enrolled on NSABP B-47. She received AC 4 followed by weekly paclitaxel (completed on 02/06/2011).  She received breast radiation in 03/2011.  She completed a year of adjuvant Herceptin on 11/13/2011. She began Arimidex in 07/2011.  She stopped Arimidex 1 month ago secondary to vasomotor symptoms.  Bilateral mammogram on 12/10/2014 revealed lumpectomy changes in the left breast without evidence of malignancy.    Bone density study on 05/25/2013 was normal with a T score of -0.4 and the left femoral neck  Symptomatically, she is feeling much better off Arimidex.  She notes a basal cell carcinoma of scalp removed more than 20 years ago.  The area of excision on her scalp is full (new per patient).  Plan: 1.  Review entire medical history, diagnosis and management of breast cancer.  Discuss vasomotor symptoms.  Discuss consideration of a trial of Femara or Aromasin.  Discuss adding back Effexor XR 75 mg a day if hot flashes return and advancing dose to 150 mg a day.  Discuss use of clonidine for management of hot flashes.  Discuss need to work with PCP regarding tapering of current anti-hypertensive medications and initiation of clonidine patch if needed (vasomotor symptoms recur and not managed by Effexor).  Discuss rebound hypertension associated with abruptly stopping clonidine. 2.  Discuss BCI testing for extended adjuvant hormonal therapy.  Patient interested in pursuing.  Complete forms. 3.  Labs today:  CBC with diff, CMP, CA27.29. 4.  Rx:  Femara 2.5 mg a day. 5.  Reschedule bone density study (due 05/2015). 6.  Encourage follow-up with dermatology regarding scalp lesion. 7.  RTC in 1 month for MD assessment and labs (LFTs).   Lequita Asal, MD   09/04/2015, 10:18 AM

## 2015-09-05 LAB — CANCER ANTIGEN 27.29: CA 27.29: 19.5 U/mL (ref 0.0–38.6)

## 2015-09-08 DIAGNOSIS — T451X5A Adverse effect of antineoplastic and immunosuppressive drugs, initial encounter: Secondary | ICD-10-CM

## 2015-09-08 DIAGNOSIS — R232 Flushing: Secondary | ICD-10-CM

## 2015-09-08 HISTORY — DX: Adverse effect of antineoplastic and immunosuppressive drugs, initial encounter: R23.2

## 2015-09-08 HISTORY — DX: Adverse effect of antineoplastic and immunosuppressive drugs, initial encounter: T45.1X5A

## 2015-09-09 ENCOUNTER — Telehealth: Payer: Self-pay | Admitting: *Deleted

## 2015-09-09 NOTE — Telephone Encounter (Signed)
Left message on patient voicemail of the details below.

## 2015-09-09 NOTE — Telephone Encounter (Signed)
  Patient needs to be evaluated by PCP or ER regarding facial numbness.  M

## 2015-09-09 NOTE — Telephone Encounter (Signed)
Called to report that she is taking Venlafaxine 150 mg and that she had forgotten to mention that she is having left sided facial numbness.

## 2015-09-11 MED ORDER — VENLAFAXINE HCL ER 75 MG PO CP24: 75.0000 mg | ORAL_CAPSULE | Freq: Every day | ORAL | Status: DC

## 2015-09-11 NOTE — Addendum Note (Signed)
Addended by: Luella Cook on: 09/11/2015 02:59 PM   Modules accepted: Orders, Medications

## 2015-09-11 NOTE — Telephone Encounter (Signed)
Called pt and got her voicemail of cell and left her a message that dr Mike Gip knows that she was on 150 mg effexor with last AI but since we switched her to new med and if she starts having hot flashes to the need to start the effexor again she wants her to start with 75 mg and then call if it is not helping after 2 weeks.  I have sent rx into her drugstore and she can pick it ahead of time or only if she needs it. And call if any questions

## 2015-10-08 ENCOUNTER — Encounter: Payer: Self-pay | Admitting: Hematology and Oncology

## 2015-10-09 ENCOUNTER — Inpatient Hospital Stay: Payer: 59 | Attending: Hematology and Oncology

## 2015-10-09 ENCOUNTER — Inpatient Hospital Stay (HOSPITAL_BASED_OUTPATIENT_CLINIC_OR_DEPARTMENT_OTHER): Payer: 59 | Admitting: Hematology and Oncology

## 2015-10-09 VITALS — BP 142/84 | HR 91 | Temp 98.7°F | Resp 18 | Wt 241.2 lb

## 2015-10-09 DIAGNOSIS — C50912 Malignant neoplasm of unspecified site of left female breast: Secondary | ICD-10-CM

## 2015-10-09 DIAGNOSIS — E669 Obesity, unspecified: Secondary | ICD-10-CM | POA: Insufficient documentation

## 2015-10-09 DIAGNOSIS — K449 Diaphragmatic hernia without obstruction or gangrene: Secondary | ICD-10-CM | POA: Diagnosis not present

## 2015-10-09 DIAGNOSIS — Z170 Estrogen receptor positive status [ER+]: Secondary | ICD-10-CM | POA: Diagnosis not present

## 2015-10-09 DIAGNOSIS — Z79811 Long term (current) use of aromatase inhibitors: Secondary | ICD-10-CM

## 2015-10-09 DIAGNOSIS — Z8041 Family history of malignant neoplasm of ovary: Secondary | ICD-10-CM

## 2015-10-09 DIAGNOSIS — M549 Dorsalgia, unspecified: Secondary | ICD-10-CM | POA: Insufficient documentation

## 2015-10-09 DIAGNOSIS — I10 Essential (primary) hypertension: Secondary | ICD-10-CM | POA: Insufficient documentation

## 2015-10-09 DIAGNOSIS — Z85828 Personal history of other malignant neoplasm of skin: Secondary | ICD-10-CM

## 2015-10-09 DIAGNOSIS — Z803 Family history of malignant neoplasm of breast: Secondary | ICD-10-CM

## 2015-10-09 DIAGNOSIS — T451X5A Adverse effect of antineoplastic and immunosuppressive drugs, initial encounter: Secondary | ICD-10-CM

## 2015-10-09 DIAGNOSIS — C50412 Malignant neoplasm of upper-outer quadrant of left female breast: Secondary | ICD-10-CM

## 2015-10-09 DIAGNOSIS — Z8619 Personal history of other infectious and parasitic diseases: Secondary | ICD-10-CM

## 2015-10-09 DIAGNOSIS — R232 Flushing: Secondary | ICD-10-CM | POA: Diagnosis not present

## 2015-10-09 DIAGNOSIS — E041 Nontoxic single thyroid nodule: Secondary | ICD-10-CM | POA: Diagnosis not present

## 2015-10-09 DIAGNOSIS — Z79899 Other long term (current) drug therapy: Secondary | ICD-10-CM

## 2015-10-09 DIAGNOSIS — Z800 Family history of malignant neoplasm of digestive organs: Secondary | ICD-10-CM

## 2015-10-09 LAB — BASIC METABOLIC PANEL
ANION GAP: 7 (ref 5–15)
BUN: 18 mg/dL (ref 6–20)
CHLORIDE: 100 mmol/L — AB (ref 101–111)
CO2: 30 mmol/L (ref 22–32)
Calcium: 9.3 mg/dL (ref 8.9–10.3)
Creatinine, Ser: 1.14 mg/dL — ABNORMAL HIGH (ref 0.44–1.00)
GFR calc Af Amer: >60 mL/min (ref >60)
GFR calc non Af Amer: 54 mL/min — ABNORMAL LOW (ref >60)
GLUCOSE: 102 mg/dL — AB (ref 65–99)
POTASSIUM: 3.4 mmol/L — AB (ref 3.5–5.1)
Sodium: 137 mmol/L (ref 135–145)

## 2015-10-09 LAB — CBC WITH DIFFERENTIAL/PLATELET
BASOS ABS: 0.1 10*3/uL (ref 0–0.1)
Basophils Relative: 1 %
Eosinophils Absolute: 0.1 10*3/uL (ref 0–0.7)
Eosinophils Relative: 1 %
HCT: 38.2 % (ref 35.0–47.0)
HEMOGLOBIN: 12.5 g/dL (ref 12.0–16.0)
LYMPHS ABS: 3 10*3/uL (ref 1.0–3.6)
LYMPHS PCT: 26 %
MCH: 27.7 pg (ref 26.0–34.0)
MCHC: 32.7 g/dL (ref 32.0–36.0)
MCV: 84.7 fL (ref 80.0–100.0)
Monocytes Absolute: 0.6 10*3/uL (ref 0.2–0.9)
Monocytes Relative: 5 %
NEUTROS ABS: 7.4 10*3/uL — AB (ref 1.4–6.5)
NEUTROS PCT: 67 %
Platelets: 296 10*3/uL (ref 150–440)
RBC: 4.5 MIL/uL (ref 3.80–5.20)
RDW: 14.1 % (ref 11.5–14.5)
WBC: 11.2 10*3/uL — AB (ref 3.6–11.0)

## 2015-10-09 LAB — HEPATIC FUNCTION PANEL
ALT: 15 U/L (ref 14–54)
AST: 18 U/L (ref 15–41)
Albumin: 4.2 g/dL (ref 3.5–5.0)
Alkaline Phosphatase: 65 U/L (ref 38–126)
Bilirubin, Direct: <0.1 mg/dL — ABNORMAL LOW (ref 0.1–0.5)
Total Bilirubin: 0.5 mg/dL (ref 0.3–1.2)
Total Protein: 7.6 g/dL (ref 6.5–8.1)

## 2015-10-09 MED ORDER — EXEMESTANE 25 MG PO TABS: 25.0000 mg | ORAL_TABLET | Freq: Every day | ORAL | 1 refills | Status: DC

## 2015-10-09 NOTE — Progress Notes (Signed)
Patient is here for follow up  

## 2015-10-09 NOTE — Progress Notes (Signed)
Heather Dudley is a 55 y.o. female with stage IIA left breast cancer who is seen for 1 month assessment on Femara.  HPI:  The patient was last seen in the medical oncology clinic on 09/04/2015.  At that time, she was seen for initial assessment by me.  She had discontinued Arimidex 1 month prior to her visit secondary to vasomotor symptoms.  She was feeling much better off Arimidex.  She noted a basal cell carcinoma of scalp removed more than 20 years ago.  The area of excision on her scalp was full (new per patient).  Follow-up with dermatology was recommended.  Labs included a normal CBC with diff, CMP, and CA27.29 (19.5).  At last visit, we discussed reinitiation of hormonal therapy.  She was given a prescription for Femara.  Her bone density study was reordered.  During the interim, she has had significant difficulties with significant hot flashes. She notes a little back pain.  She is on Effexor 75 mg a day.   Past Medical History:  Diagnosis Date  . Basal cell carcinoma of forehead    birthmark of forehead  . Breast screening, unspecified   . Cellulitis and abscess of trunk   . Hernia   . History of chemotherapy 2012  . Hypertension   . Lump or mass in breast   . Malignant neoplasm of breast (female), unspecified site    She underwent wide excision, mastoplasty and axillary dissection for her left breast malignancy on September 03, 2010. The primary tumor was 1 cm in diameter, and a single positive axillary node 1.3 cm in diameter. This was an ER-positive, PR slightly positive, HER-2/neu not overexpressing tumor. She tolerated her whole breast radiation without difficulty ending in late February 2013.  . Malignant neoplasm of upper-outer quadrant of female breast ([place])   . 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 Obesity, unspecified   . Personal history of  malignant neoplasm of breast    The patient underwent wide excision, mastoplasty. Dissection for a T1b, N1, M0 carcinoma left breast on September 03, 2010.The primary tumor was 1 cm in diameter, and a single positive axillary node 1.3 cm in diameter. This was an ER-positive, PR slightly positive, HER-2/neu not overexpressing tumor.  The patient completed whole breast radiation in February 2013.  [name] Screening for obesity   . Thyroid nodule     Past Surgical History:  Procedure Laterality Date  . ABDOMINAL HYSTERECTOMY  2011  . birth mark removed    . BREAST CYST ASPIRATION Right    negative 01/2010  . BREAST EXCISIONAL BIOPSY  August 05, 2010   left breast positive 07/2010  . BREAST LUMPECTOMY  2012   left breast  . COLONOSCOPY  July 29, 2012   normal study.  . THYROID SURGERY     partially removed  . TONSILLECTOMY    . TUBAL LIGATION      Family History  Problem Relation Age of Onset  . Breast cancer Other   . Colon cancer Other   . Ovarian cancer Other     Social History:  reports that she has never smoked. She has never used smokeless tobacco. She reports that she does not drink alcohol or use drugs.  She is from [place].  The patient is alone today.  Allergies:  Allergies  Allergen Reactions  . Penicillins Rash  Current Medications: Current Outpatient Prescriptions  Medication Sig Dispense Refill  . b complex vitamins tablet Take 1 tablet by mouth daily.    . Cholecalciferol (VITAMIN D) 2000 units CAPS Take 1 capsule (2,000 Units total) by mouth daily. 30 capsule   . letrozole (FEMARA) 2.5 MG tablet Take 1 tablet (2.5 mg total) by mouth daily. 30 tablet 1  . lisinopril-hydrochlorothiazide (PRINZIDE,ZESTORETIC) 20-25 MG tablet Take 1 tablet by mouth daily. 30 tablet 2  . Multiple Vitamins-Minerals (MULTIVITAMIN PO) Take by mouth daily. Reported on 05/15/2015    . Omega 3 1000 MG CAPS Take 1 capsule by mouth daily.    [name] venlafaxine XR (EFFEXOR-XR) 75 MG 24 hr capsule Take 1  capsule (75 mg total) by mouth daily with breakfast. 30 capsule 1  . benzonatate (TESSALON) 100 MG capsule Reported on 09/04/2015  0  . lidocaine (XYLOCAINE) 2 % solution Reported on 09/04/2015  0   No current facility-administered medications for this visit.     Review of Systems:  GENERAL:  Feels "ok".  No fevers or sweats.  Weight up 2 pounds. PERFORMANCE STATUS (ECOG):  0 HEENT:  No visual changes, runny nose, sore throat, mouth sores or tenderness. Lungs: No shortness of breath or cough.  No hemoptysis. Cardiac:  No chest pain, palpitations, orthopnea, or PND. GI:  No nausea, vomiting, diarrhea, constipation, melena or hematochezia. GU:  No urgency, frequency, dysuria, or hematuria. Musculoskeletal:  No back pain.  No joint pain.  No muscle tenderness. Extremities:  No pain or swelling. Skin:  Spot on scalp (see HPI).  No rashes or skin changes. Neuro:  No headache, numbness or weakness, balance or coordination issues. Endocrine:  No diabetes, thyroid issues.  Significant hot flashes.  No night sweats. Psych:  No mood changes, depression or anxiety. Pain:  No focal pain. Review of systems:  All other systems reviewed and found to be negative.  Physical Exam: Blood pressure (!) 142/84, pulse 91, temperature 98.7 F (37.1 C), temperature source Tympanic, resp. rate 18, weight 241 lb 2.9 oz (109.4 kg), last menstrual period 04/01/2008. GENERAL:  Well developed, well nourished, heavyset woman sitting comfortably in the exam room in no acute distress. MENTAL STATUS:  Alert and oriented to person, place and time. HEAD:  Short brown hair.  Normocephalic, atraumatic, face symmetric, no Cushingoid features. EYES:  Brown eyes.  No conjunctivitis or scleral icterus. NEUROLOGICAL: Unremarkable. PSYCH:  Appropriate.   Office Visit on 10/09/2015  Component Date Value Ref Range Status  . WBC 10/09/2015 11.2* 3.6 - 11.0 K/uL Final  . RBC 10/09/2015 4.50  3.80 - 5.20 MIL/uL Final  .  Hemoglobin 10/09/2015 12.5  12.0 - 16.0 g/dL Final  . HCT 10/09/2015 38.2  35.0 - 47.0 % Final  . MCV 10/09/2015 84.7  80.0 - 100.0 fL Final  . MCH 10/09/2015 27.7  26.0 - 34.0 pg Final  . MCHC 10/09/2015 32.7  32.0 - 36.0 g/dL Final  . RDW 10/09/2015 14.1  11.5 - 14.5 % Final  . Platelets 10/09/2015 296  150 - 440 K/uL Final  . Neutrophils Relative % 10/09/2015 67  % Final  . Neutro Abs 10/09/2015 7.4* 1.4 - 6.5 K/uL Final  . Lymphocytes Relative 10/09/2015 26  % Final  . Lymphs Abs 10/09/2015 3.0  1.0 - 3.6 K/uL Final  . Monocytes Relative 10/09/2015 5  % Final  . Monocytes Absolute 10/09/2015 0.6  0.2 - 0.9 K/uL Final  . Eosinophils Relative 10/09/2015 1  % Final  .  Eosinophils Absolute 10/09/2015 0.1  0 - 0.7 K/uL Final  . Basophils Relative 10/09/2015 1  % Final  . Basophils Absolute 10/09/2015 0.1  0 - 0.1 K/uL Final    Assessment:  Heather Dudley is a 55 y.o. female with stage IIA left breast cancer s/p wide excision and axillary lymph node dissection on 09/03/2010.  Pathology revealed a 1.0 cm grade II invasive ductal carcinoma. One of 14 lymph nodes were positive macrometastasis of 1.3 cm.  Pathologic stage was T1cN1bM0.  Tumor was ER positive (90%), PR positive (20%), and HER-2/neu negative by FISH (Her-2/CEP17 ratio < 1.8).  She enrolled on NSABP B-47. She received AC 4 followed by weekly paclitaxel (completed on 02/06/2011).  She received breast radiation in 03/2011.  She completed a year of adjuvant Herceptin on 11/13/2011. She was on Arimidex from 07/2011 - 07/2015.  She stopped Arimidex secondary to vasomotor symptoms.  Bilateral mammogram on 12/10/2014 revealed lumpectomy changes in the left breast without evidence of malignancy.    CA27.29 was 19.5 on 09/04/2015.  Bone density study on 05/25/2013 was normal with a T score of -0.4 and the left femoral neck  Symptomatically, she notes unacceptable vasomotor symptoms on Femara.  Plan: 1.  Labs today:  CBC with diff,  CMP. 2.  Discuss patient's thoughts about therapy.  Patient wishes to discontinue Femara.  She is agreeable to trying Aromasin.  Information provided. 3.  Rx:  Aromasin 25 mg po q day. 4.  RTC in 1 month for MD assessment and labs (LFTs).   Lequita Asal, MD  10/09/2015, 4:25 PM

## 2015-10-09 NOTE — Patient Instructions (Signed)
Exemestane tablets  What is this medicine?  EXEMESTANE (ex e MES tane) blocks the production of the hormone estrogen. Some types of breast cancer depend on estrogen to grow, and this medicine can stop tumor growth by blocking estrogen production. This medicine is for the treatment of breast cancer in postmenopausal women only.  This medicine may be used for other purposes; ask your health care provider or pharmacist if you have questions.  What should I tell my health care provider before I take this medicine?  They need to know if you have any of these conditions:  -an unusual or allergic reaction to exemestane, other medicines, foods, dyes, or preservatives  -pregnant or trying to get pregnant  -breast-feeding  How should I use this medicine?  Take this medicine by mouth with a glass of water. Follow the directions on the prescription label. Take your doses at regular intervals after a meal. Do not take your medicine more often than directed. Do not stop taking except on the advice of your doctor or health care professional.  Contact your pediatrician regarding the use of this medicine in children. Special care may be needed.  Overdosage: If you think you have taken too much of this medicine contact a poison control center or emergency room at once.  NOTE: This medicine is only for you. Do not share this medicine with others.  What if I miss a dose?  If you miss a dose, take the next dose as usual. Do not try to make up the missed dose. Do not take double or extra doses.  What may interact with this medicine?  Do not take this medicine with any of the following medications:  -female hormones, like estrogens and birth control pills  This medicine may also interact with the following medications:  -androstenedione  -phenytoin  -rifabutin, rifampin, or rifapentine  -St. John's Wort  This list may not describe all possible interactions. Give your health care provider a list of all the medicines, herbs,  non-prescription drugs, or dietary supplements you use. Also tell them if you smoke, drink alcohol, or use illegal drugs. Some items may interact with your medicine.  What should I watch for while using this medicine?  Visit your doctor or health care professional for regular checks on your progress.  If you experience hot flashes or sweating while taking this medicine, avoid alcohol, smoking and drinks with caffeine. This may help to decrease these side effects.  What side effects may I notice from receiving this medicine?  Side effects that you should report to your doctor or health care professional as soon as possible:  -any new or unusual symptoms  -changes in vision  -fever  -leg or arm swelling  -pain in bones, joints, or muscles  -pain in hips, back, ribs, arms, shoulders, or legs  Side effects that usually do not require medical attention (report to your doctor or health care professional if they continue or are bothersome):  -difficulty sleeping  -headache  -hot flashes  -sweating  -unusually weak or tired  This list may not describe all possible side effects. Call your doctor for medical advice about side effects. You may report side effects to FDA at 1-800-FDA-1088.  Where should I keep my medicine?  Keep out of the reach of children.  Store at room temperature between 15 and 30 degrees C (59 and 86 degrees F). Throw away any unused medicine after the expiration date.  NOTE: This sheet is a summary. It may 

## 2015-10-10 LAB — CANCER ANTIGEN 27.29: CA 27.29: 21.7 U/mL (ref 0.0–38.6)

## 2015-11-02 ENCOUNTER — Other Ambulatory Visit: Payer: Self-pay | Admitting: Hematology and Oncology

## 2015-11-06 ENCOUNTER — Encounter: Payer: Self-pay | Admitting: Hematology and Oncology

## 2015-11-06 ENCOUNTER — Inpatient Hospital Stay (HOSPITAL_BASED_OUTPATIENT_CLINIC_OR_DEPARTMENT_OTHER): Payer: 59 | Admitting: Hematology and Oncology

## 2015-11-06 ENCOUNTER — Inpatient Hospital Stay: Payer: 59 | Attending: Hematology and Oncology

## 2015-11-06 VITALS — BP 141/84 | HR 91 | Temp 98.7°F | Resp 18 | Wt 243.6 lb

## 2015-11-06 DIAGNOSIS — Z170 Estrogen receptor positive status [ER+]: Secondary | ICD-10-CM | POA: Diagnosis not present

## 2015-11-06 DIAGNOSIS — Z800 Family history of malignant neoplasm of digestive organs: Secondary | ICD-10-CM

## 2015-11-06 DIAGNOSIS — R232 Flushing: Secondary | ICD-10-CM

## 2015-11-06 DIAGNOSIS — Z8619 Personal history of other infectious and parasitic diseases: Secondary | ICD-10-CM

## 2015-11-06 DIAGNOSIS — E041 Nontoxic single thyroid nodule: Secondary | ICD-10-CM | POA: Diagnosis not present

## 2015-11-06 DIAGNOSIS — Z79899 Other long term (current) drug therapy: Secondary | ICD-10-CM | POA: Insufficient documentation

## 2015-11-06 DIAGNOSIS — C50412 Malignant neoplasm of upper-outer quadrant of left female breast: Secondary | ICD-10-CM | POA: Diagnosis not present

## 2015-11-06 DIAGNOSIS — C779 Secondary and unspecified malignant neoplasm of lymph node, unspecified: Secondary | ICD-10-CM | POA: Insufficient documentation

## 2015-11-06 DIAGNOSIS — Z803 Family history of malignant neoplasm of breast: Secondary | ICD-10-CM

## 2015-11-06 DIAGNOSIS — Z8041 Family history of malignant neoplasm of ovary: Secondary | ICD-10-CM

## 2015-11-06 DIAGNOSIS — C50912 Malignant neoplasm of unspecified site of left female breast: Secondary | ICD-10-CM

## 2015-11-06 DIAGNOSIS — Z79811 Long term (current) use of aromatase inhibitors: Secondary | ICD-10-CM | POA: Insufficient documentation

## 2015-11-06 LAB — HEPATIC FUNCTION PANEL
ALT: 15 U/L (ref 14–54)
AST: 17 U/L (ref 15–41)
Albumin: 4.3 g/dL (ref 3.5–5.0)
Alkaline Phosphatase: 79 U/L (ref 38–126)
Bilirubin, Direct: <0.1 mg/dL — ABNORMAL LOW (ref 0.1–0.5)
Total Bilirubin: 0.2 mg/dL — ABNORMAL LOW (ref 0.3–1.2)
Total Protein: 8 g/dL (ref 6.5–8.1)

## 2015-11-06 MED ORDER — VENLAFAXINE HCL ER 150 MG PO CP24: 150.0000 mg | ORAL_CAPSULE | Freq: Every day | ORAL | 3 refills | Status: DC

## 2015-11-06 MED ORDER — EXEMESTANE 25 MG PO TABS: 25.0000 mg | ORAL_TABLET | Freq: Every day | ORAL | 4 refills | Status: DC

## 2015-11-06 NOTE — Progress Notes (Signed)
[facility]  Clinic day:  11/06/2015  Chief Complaint: Heather Dudley is a 55 y.o. female with stage IIA left breast cancer who is seen for 1 month assessment on Aromasin.  HPI:  The patient was last seen in the medical oncology clinic on 10/09/2015.  At that time, Femara was discontinued secondary to significant vasomotor symptoms affecting quality of life.  We discussed a trial of Aromasin.  During the the interim, she notes some hot flashes, but not as bad as before.  She takesher Aromasin after eating in the morning.   She is also taking Effexor XR 150 mg a day.  Breast cancer index (BCI) testing on 10/08/2015 revealed a high risk of late recurrence  Risk is estimated at 12.7% (CI: 7.4%-17.7%) during years 5-10.   She has a high likelihood of benefit from extended adjuvant hormonal therapy.  She has not had her bone density study. She has not followed up with dermatology.   Past Medical History:  Diagnosis Date  . Basal cell carcinoma of forehead    birthmark of forehead  . Breast screening, unspecified   . Cellulitis and abscess of trunk   . Hernia   . History of chemotherapy 2012  . Hypertension   . Lump or mass in breast   . Malignant neoplasm of breast (female), unspecified site    She underwent wide excision, mastoplasty and axillary dissection for her left breast malignancy on September 03, 2010. The primary tumor was 1 cm in diameter, and a single positive axillary node 1.3 cm in diameter. This was an ER-positive, PR slightly positive, HER-2/neu not overexpressing tumor. She tolerated her whole breast radiation without difficulty ending in late February 2013.  . Malignant neoplasm of upper-outer quadrant of female breast (HCC)   . 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 Obesity, unspecified   . Personal history of malignant neoplasm of breast    The patient underwent wide excision, mastoplasty. Dissection  for a T1b, N1, M0 carcinoma left breast on September 03, 2010.The primary tumor was 1 cm in diameter, and a single positive axillary node 1.3 cm in diameter. This was an ER-positive, PR slightly positive, HER-2/neu not overexpressing tumor.  The patient completed whole breast radiation in February 2013.  [name] Screening for obesity   . Thyroid nodule     Past Surgical History:  Procedure Laterality Date  . ABDOMINAL HYSTERECTOMY  2011  . birth mark removed    . BREAST CYST ASPIRATION Right    negative 01/2010  . BREAST EXCISIONAL BIOPSY  August 05, 2010   left breast positive 07/2010  . BREAST LUMPECTOMY  2012   left breast  . COLONOSCOPY  July 29, 2012   normal study.  . THYROID SURGERY     partially removed  . TONSILLECTOMY    . TUBAL LIGATION      Family History  Problem Relation Age of Onset  . Breast cancer Other   . Colon cancer Other   . Ovarian cancer Other     Social History:  reports that she has never smoked. She has never used smokeless tobacco. She reports that she does not drink alcohol or use drugs.  She is from [place].  The patient is alone today.  Allergies:  Allergies  Allergen Reactions  . Penicillins Rash    Current Medications: Current Outpatient Prescriptions  Medication Sig Dispense Refill  . b complex vitamins tablet Take 1  tablet by mouth daily.    . Cholecalciferol (VITAMIN D) 2000 units CAPS Take 1 capsule (2,000 Units total) by mouth daily. 30 capsule   . exemestane (AROMASIN) 25 MG tablet Take 1 tablet (25 mg total) by mouth daily after breakfast. 30 tablet 4  . Multiple Vitamins-Minerals (MULTIVITAMIN PO) Take by mouth daily. Reported on 05/15/2015    . Omega 3 1000 MG CAPS Take 1 capsule by mouth daily.    [name] venlafaxine XR (EFFEXOR-XR) 150 MG 24 hr capsule Take 1 capsule (150 mg total) by mouth daily with breakfast. 30 capsule 3  . lisinopril-hydrochlorothiazide (PRINZIDE,ZESTORETIC) 20-25 MG tablet Take 1 tablet by mouth daily. 30 tablet 0   No  current facility-administered medications for this visit.     Review of Systems:  GENERAL:  Feels "fine".  No fevers or sweats.  Weight up 2 pounds. PERFORMANCE STATUS (ECOG):  0 HEENT:  No visual changes, runny nose, sore throat, mouth sores or tenderness. Lungs: No shortness of breath or cough.  No hemoptysis. Cardiac:  No chest pain, palpitations, orthopnea, or PND. GI:  No nausea, vomiting, diarrhea, constipation, melena or hematochezia. GU:  No urgency, frequency, dysuria, or hematuria. Musculoskeletal:  No back pain.  No joint pain.  No muscle tenderness. Extremities:  No pain or swelling. Skin:  Spot on scalp (see HPI).  No rashes or skin changes. Neuro:  No headache, numbness or weakness, balance or coordination issues. Endocrine:  No diabetes, thyroid issues.  Significant hot flashes.  No night sweats. Psych:  No mood changes, depression or anxiety. Pain:  No focal pain. Review of systems:  All other systems reviewed and found to be negative.  Physical Exam: Blood pressure (!) 141/84, pulse 91, temperature 98.7 F (37.1 C), temperature source Tympanic, resp. rate 18, weight 243 lb 9.7 oz (110.5 kg), last menstrual period 04/01/2008. GENERAL:  Well developed, well nourished, heavyset woman sitting comfortably in the exam room in no acute distress. MENTAL STATUS:  Alert and oriented to person, place and time. HEAD:  Short brown hair.  Normocephalic, atraumatic, face symmetric, no Cushingoid features. EYES:  Brown eyes.  No conjunctivitis or scleral icterus. NEUROLOGICAL: Unremarkable. PSYCH:  Appropriate.   Appointment on 11/06/2015  Component Date Value Ref Range Status  . Total Protein 11/06/2015 8.0  6.5 - 8.1 g/dL Final  . Albumin 11/06/2015 4.3  3.5 - 5.0 g/dL Final  . AST 11/06/2015 17  15 - 41 U/L Final  . ALT 11/06/2015 15  14 - 54 U/L Final  . Alkaline Phosphatase 11/06/2015 79  38 - 126 U/L Final  . Total Bilirubin 11/06/2015 0.2* 0.3 - 1.2 mg/dL Final  .  Bilirubin, Direct 11/06/2015 <0.1* 0.1 - 0.5 mg/dL Final  . Indirect Bilirubin 11/06/2015 NOT CALCULATED  0.3 - 0.9 mg/dL Final    Assessment:  Heather Dudley is a 55 y.o. female with stage IIA left breast cancer s/p wide excision and axillary lymph node dissection on 09/03/2010.  Pathology revealed a 1.0 cm grade II invasive ductal carcinoma. One of 14 lymph nodes were positive macrometastasis of 1.3 cm.  Pathologic stage was T1cN1bM0.  Tumor was ER positive (90%), PR positive (20%), and HER-2/neu negative by FISH (Her-2/CEP17 ratio < 1.8).  She enrolled on NSABP B-47. She received AC 4 followed by weekly paclitaxel (completed on 02/06/2011).  She received breast radiation in 03/2011.  She completed a year of adjuvant Herceptin on 11/13/2011. She was on Arimidex from 07/2011 - 07/2015 and Femara from 09/04/2015 -  10/09/2015.  She stopped Arimidex and Femara secondary to vasomotor symptoms.  She has been on Aromasin since 10/09/2015  Breast cancer index (BCI) testing on 10/08/2015 revealed a high risk of late recurrence  Risk is estimated at 12.7% (CI: 7.4%-17.7%) during years 5-10.   She has a high likelihood of benefit from extended adjuvant hormonal therapy.  Bilateral mammogram on 12/10/2014 revealed lumpectomy changes in the left breast without evidence of malignancy.    CA27.29 was 19.5 on 09/04/2015.  Bone density study on 05/25/2013 was normal with a T score of -0.4 and the left femoral neck  Symptomatically, she is doing well on Aromasin.  Vasomotor symptoms are managed with Effexor.  Plan: 1.  Labs today:  LFTs. 2.  Refill Aromasin. 3.  Refill Effexor XR 150 mg po q day. 4.  Reschedule bone density study (due 05/2015). 5.  Discuss BCI testing with 12.7% risk of late recurrence (years 5-10) and high likelihood of benefit.  Copy of testing for patient. 6.  Encourage follow-up with dermatology regarding scalp lesion (Dr. Tonette Lederer). 7.  RTC in 3 month for MD assessment  and labs (CBC with diff, CMP, CA27.29).   Lequita Asal, MD  11/06/2015

## 2015-11-08 ENCOUNTER — Other Ambulatory Visit: Payer: Self-pay | Admitting: Internal Medicine

## 2015-11-08 DIAGNOSIS — I10 Essential (primary) hypertension: Secondary | ICD-10-CM

## 2015-11-08 MED ORDER — LISINOPRIL-HYDROCHLOROTHIAZIDE 20-25 MG PO TABS: 1.0000 | ORAL_TABLET | Freq: Every day | ORAL | 0 refills | Status: DC

## 2015-11-09 ENCOUNTER — Encounter: Payer: Self-pay | Admitting: Hematology and Oncology

## 2015-11-12 ENCOUNTER — Telehealth: Payer: Self-pay | Admitting: *Deleted

## 2015-11-12 NOTE — Telephone Encounter (Signed)
The patient came into office last wed and at that time got results for BCI testing.  Dr. Corcoran had spoke to pt on previous appt. About doing testing for this pt.  I had told her that it was $5400.00 test and that I sent fax request after speaking to [facility] that if I sent on cover sheet that they will contact pt.  Per LOrene at benefit check that they did try contacting pt and left her message to have her call back on 3 occassions and then called cancer clinic and asked Louise if they had any numbers that they could call and there were no new numbers.  Lorene feels that they have met their commitment but I expressed specifically to contact pt before running test and now pt has the potential for a big bill.  She states that with insurance they do not know if it will take 3 months to 12 months to get the answer from approval or denial of her insurance and how much she would have to pay.  The patient could call Lorene and see if they qualify for pt asst so no matter what the cost if she qualifies it should put their minds at ease.  I did leave this message on pt's work voicemail and will await for her to call me back. 

## 2015-11-15 ENCOUNTER — Other Ambulatory Visit: Payer: Self-pay | Admitting: *Deleted

## 2015-11-15 DIAGNOSIS — C50912 Malignant neoplasm of unspecified site of left female breast: Secondary | ICD-10-CM

## 2015-11-20 ENCOUNTER — Ambulatory Visit (INDEPENDENT_AMBULATORY_CARE_PROVIDER_SITE_OTHER): Payer: 59 | Admitting: Internal Medicine

## 2015-11-20 ENCOUNTER — Encounter: Payer: Self-pay | Admitting: Internal Medicine

## 2015-11-20 VITALS — BP 128/84 | HR 93 | Resp 16 | Ht 67.0 in | Wt 243.0 lb

## 2015-11-20 DIAGNOSIS — C50912 Malignant neoplasm of unspecified site of left female breast: Secondary | ICD-10-CM | POA: Diagnosis not present

## 2015-11-20 DIAGNOSIS — T783XXA Angioneurotic edema, initial encounter: Secondary | ICD-10-CM | POA: Diagnosis not present

## 2015-11-20 DIAGNOSIS — I10 Essential (primary) hypertension: Secondary | ICD-10-CM | POA: Diagnosis not present

## 2015-11-20 DIAGNOSIS — D485 Neoplasm of uncertain behavior of skin: Secondary | ICD-10-CM | POA: Diagnosis not present

## 2015-11-20 MED ORDER — HYDROCHLOROTHIAZIDE 25 MG PO TABS: 25.0000 mg | ORAL_TABLET | Freq: Every day | ORAL | 1 refills | Status: DC

## 2015-11-20 NOTE — Progress Notes (Signed)
Date:  11/20/2015   Name:  Heather Dudley   DOB:  11-02-1960   MRN:  [id number]   Chief Complaint: Hypertension and Mouth Injury (No injury. Tongue swollen post new RX from cancer center.)  Hypertension  This is a chronic problem. The current episode started more than 1 year ago. The problem is unchanged. The problem is controlled. Pertinent negatives include no chest pain, palpitations or shortness of breath. Past treatments include ACE inhibitors and diuretics. The current treatment provides significant improvement. There are no compliance problems (dose of medication increased in June).   Mouth Injury The primary symptoms include angioedema. Primary symptoms do not include mouth pain, fever, shortness of breath or cough. The symptoms began 5 to 7 days ago. The symptoms are unchanged. The symptoms are new.  The angioedema is not associated with shortness of breath.  Additional symptoms include: trouble swallowing (and enlarged tongue). Additional symptoms do not include: trismus, jaw pain, facial swelling, pain with swallowing, excessive salivation, hearing loss and swollen glands.   Rash  This is a new problem. The current episode started 1 to 4 weeks ago. The problem is unchanged. The affected locations include the scalp (small mass adjacent to previous BCCA resection). Pertinent negatives include no cough, fever or shortness of breath.  She recently (6 weeks ago) switched aromatase inhibitors and thought this might be the cause.  She has not taken it in about 4 days.   Review of Systems  Constitutional: Negative for appetite change, diaphoresis and fever.  HENT: Positive for trouble swallowing (and enlarged tongue). Negative for facial swelling and hearing loss.   Respiratory: Negative for cough, chest tightness and shortness of breath.   Cardiovascular: Negative for chest pain and palpitations.  Skin: Positive for rash.    Patient Active Problem List   Diagnosis Date Noted    . Hot flashes related to aromatase inhibitor therapy 09/08/2015  . Vitamin D deficiency 04/19/2015  . Cervical radiculopathy 04/17/2015  . Hypertension 04/17/2015  . Hot flashes 04/17/2015  . History of partial thyroidectomy 04/17/2015  . Obesity, Class II, BMI 35-39.9 04/17/2015  . Lipoma of axilla 10/24/2014  . Breast cancer, left breast ([place]) 09/02/2010    Prior to Admission medications   Medication Sig Start Date End Date Taking? Authorizing Provider  b complex vitamins tablet Take 1 tablet by mouth daily.   Yes Historical Provider, MD  Cholecalciferol (VITAMIN D) 2000 units CAPS Take 1 capsule (2,000 Units total) by mouth daily. 04/19/15  Yes Adline Potter, MD  lisinopril-hydrochlorothiazide (PRINZIDE,ZESTORETIC) 20-25 MG tablet Take 1 tablet by mouth daily. 11/08/15  Yes Adline Potter, MD  Multiple Vitamins-Minerals (MULTIVITAMIN PO) Take by mouth daily. Reported on 05/15/2015   Yes Historical Provider, MD  Omega 3 1000 MG CAPS Take 1,200 mg by mouth 2 (two) times daily.    Yes Historical Provider, MD  venlafaxine XR (EFFEXOR-XR) 150 MG 24 hr capsule Take 1 capsule (150 mg total) by mouth daily with breakfast. 11/06/15  Yes Lequita Asal, MD  exemestane (AROMASIN) 25 MG tablet Take 1 tablet (25 mg total) by mouth daily after breakfast. Patient not taking: Reported on 11/20/2015 11/06/15   Lequita Asal, MD    Allergies  Allergen Reactions  . Penicillins Rash    Past Surgical History:  Procedure Laterality Date  . ABDOMINAL HYSTERECTOMY  2011  . birth mark removed    . BREAST CYST ASPIRATION Right    negative 01/2010  . BREAST EXCISIONAL BIOPSY  August 05, 2010   left breast positive 07/2010  . BREAST LUMPECTOMY  2012   left breast  . COLONOSCOPY  July 29, 2012   normal study.  . THYROID SURGERY     partially removed  . TONSILLECTOMY    . TUBAL LIGATION      Social History  Substance Use Topics  . Smoking status: Never Smoker  . Smokeless tobacco: Never Used   . Alcohol use No     Medication list has been reviewed and updated.   Physical Exam  Constitutional: She is oriented to person, place, and time. She appears well-developed and well-nourished.  HENT:  Mouth/Throat: No uvula swelling.    Neck: No tracheal tenderness present. No thyromegaly present.  Cardiovascular: Normal rate, regular rhythm and normal heart sounds.   Pulmonary/Chest: Effort normal and breath sounds normal. No accessory muscle usage or stridor. No respiratory distress. She has no decreased breath sounds. She has no wheezes.  Musculoskeletal: She exhibits no edema.  Neurological: She is alert and oriented to person, place, and time.  Skin: Skin is warm and dry.  4 mm nodule adjacent to scar on anterior scalp    BP 128/84   Pulse 93   Resp 16   Ht 5\' 7"  (1.702 m)   Wt 243 lb (110.2 kg)   LMP 04/01/2008   SpO2 98%   BMI 38.06 kg/m   Assessment and Plan: 1. Angioedema, initial encounter Likely due to ACEI and dose change Will stop medication and monitor Go to ER if worsening  2. Essential hypertension Stop ACEI and begin diuretic alone Re-evaluate BP in 4 weeks - hydrochlorothiazide (HYDRODIURIL) 25 MG tablet; Take 1 tablet (25 mg total) by mouth daily.  Dispense: 30 tablet; Refill: 1  3. Neoplasm of uncertain behavior of skin Dermatology evaluation recommended  4. Malignant neoplasm of left female breast, unspecified site of breast ([place]) Remain off Aromasin until discussed with Oncology   Halina Maidens, MD [facility]  11/20/2015

## 2015-12-04 ENCOUNTER — Ambulatory Visit
Admission: RE | Admit: 2015-12-04 | Discharge: 2015-12-04 | Disposition: A | Discharge status: Home / Self Care | Payer: 59 | Source: Ambulatory Visit | Attending: Hematology and Oncology | Admitting: Hematology and Oncology

## 2015-12-04 DIAGNOSIS — C50912 Malignant neoplasm of unspecified site of left female breast: Secondary | ICD-10-CM

## 2015-12-04 DIAGNOSIS — Z79811 Long term (current) use of aromatase inhibitors: Secondary | ICD-10-CM | POA: Insufficient documentation

## 2015-12-14 ENCOUNTER — Other Ambulatory Visit: Payer: Self-pay | Admitting: Family Medicine

## 2015-12-17 ENCOUNTER — Ambulatory Visit: Payer: 59 | Admitting: Internal Medicine

## 2015-12-18 ENCOUNTER — Ambulatory Visit: Payer: 59 | Admitting: Internal Medicine

## 2016-01-02 ENCOUNTER — Ambulatory Visit (INDEPENDENT_AMBULATORY_CARE_PROVIDER_SITE_OTHER): Payer: 59 | Admitting: Internal Medicine

## 2016-01-02 ENCOUNTER — Encounter: Payer: Self-pay | Admitting: Internal Medicine

## 2016-01-02 VITALS — BP 118/82 | HR 90 | Resp 16 | Ht 67.0 in | Wt 245.0 lb

## 2016-01-02 DIAGNOSIS — J3089 Other allergic rhinitis: Secondary | ICD-10-CM | POA: Insufficient documentation

## 2016-01-02 DIAGNOSIS — I10 Essential (primary) hypertension: Secondary | ICD-10-CM | POA: Diagnosis not present

## 2016-01-02 DIAGNOSIS — T783XXD Angioneurotic edema, subsequent encounter: Secondary | ICD-10-CM | POA: Diagnosis not present

## 2016-01-02 MED ORDER — HYDROCHLOROTHIAZIDE 25 MG PO TABS: 25.0000 mg | ORAL_TABLET | Freq: Every day | ORAL | 3 refills | Status: DC

## 2016-01-02 NOTE — Progress Notes (Signed)
Date:  01/02/2016   Name:  Heather Dudley   DOB:  [birth year]   MRN:  [id number]   Chief Complaint: Hypertension and Facial Swelling (face swellign and tongue swells. ) Hypertension  This is a chronic problem. The current episode started more than 1 year ago. The problem is controlled. Pertinent negatives include no headaches, palpitations or shortness of breath. Past treatments include ACE inhibitors and diuretics (angioedema thought due to ACEI). The current treatment provides significant improvement. There are no compliance problems (she feels great on this medication with excellent BP at home -all less than 130/84).    Angioedema - still having swollen tongue and intermittent facial puffiness.  Stopping ACEI and Aromatase inhibitors did not significantly impact the sx.  She has some swallowing difficulty but no choking or coughing.  She has bitten her tongue on a few occasions.  Allergies/sinus - has ongoing sinus congestion, nasal swelling and facial fullness.  She feels that she has symptoms off and on but has never had allergy testing or been evaluated otherwise.  Review of Systems  Constitutional: Negative for appetite change, fatigue, fever and unexpected weight change.  HENT: Positive for congestion, facial swelling, postnasal drip and sinus pressure. Negative for ear pain, nosebleeds and tinnitus.        Tongue swelling  Eyes: Negative for visual disturbance.  Respiratory: Negative for chest tightness and shortness of breath.   Cardiovascular: Negative for palpitations and leg swelling.  Endocrine: Negative for polydipsia and polyuria.  Genitourinary: Negative for dysuria and hematuria.  Musculoskeletal: Negative for arthralgias.  Neurological: Negative for tremors, numbness and headaches.  Psychiatric/Behavioral: Negative for dysphoric mood.    Patient Active Problem List   Diagnosis Date Noted  . Hot flashes related to aromatase inhibitor therapy 09/08/2015  .  Vitamin D deficiency 04/19/2015  . Cervical radiculopathy 04/17/2015  . Hypertension 04/17/2015  . Hot flashes 04/17/2015  . History of partial thyroidectomy 04/17/2015  . Obesity, Class II, BMI 35-39.9 04/17/2015  . Lipoma of axilla 10/24/2014  . Breast cancer, left breast ([place]) 09/02/2010    Prior to Admission medications   Medication Sig Start Date End Date Taking? Authorizing Provider  b complex vitamins tablet Take 1 tablet by mouth daily.   Yes Historical Provider, MD  Cholecalciferol (VITAMIN D) 2000 units CAPS Take 1 capsule (2,000 Units total) by mouth daily. 04/19/15  Yes Adline Potter, MD  exemestane (AROMASIN) 25 MG tablet Take 1 tablet (25 mg total) by mouth daily after breakfast. 11/06/15  Yes Lequita Asal, MD  hydrochlorothiazide (HYDRODIURIL) 25 MG tablet Take 1 tablet (25 mg total) by mouth daily. 11/20/15  Yes Glean Hess, MD  Multiple Vitamins-Minerals (MULTIVITAMIN PO) Take by mouth daily. Reported on 05/15/2015   Yes Historical Provider, MD  Omega 3 1000 MG CAPS Take 1,200 mg by mouth 2 (two) times daily.    Yes Historical Provider, MD  venlafaxine XR (EFFEXOR-XR) 150 MG 24 hr capsule Take 1 capsule (150 mg total) by mouth daily with breakfast. 11/06/15  Yes Lequita Asal, MD    Allergies  Allergen Reactions  . Ace Inhibitors Swelling  . Penicillins Rash    Past Surgical History:  Procedure Laterality Date  . ABDOMINAL HYSTERECTOMY  2011  . birth mark removed    . BREAST CYST ASPIRATION Right    negative 01/2010  . BREAST EXCISIONAL BIOPSY  August 05, 2010   left breast positive 07/2010  . BREAST LUMPECTOMY  2012   left  breast  . COLONOSCOPY  July 29, 2012   normal study.  . THYROID SURGERY     partially removed  . TONSILLECTOMY    . TUBAL LIGATION      Social History  Substance Use Topics  . Smoking status: Never Smoker  . Smokeless tobacco: Never Used  . Alcohol use No     Medication list has been reviewed and updated.   Physical  Exam  Constitutional: She is oriented to person, place, and time. She appears well-developed and well-nourished.  HENT:  Mouth/Throat: No uvula swelling.    Neck: No tracheal tenderness present. No thyromegaly present.  Cardiovascular: Normal rate, regular rhythm and normal heart sounds.   Pulmonary/Chest: Effort normal and breath sounds normal. No accessory muscle usage or stridor. No respiratory distress. She has no decreased breath sounds. She has no wheezes.  Musculoskeletal: She exhibits no edema.  Neurological: She is alert and oriented to person, place, and time. She has normal reflexes.  Skin: Skin is warm and dry.    BP 128/84   Pulse 90   Resp 16   Ht 5\' 7"  (1.702 m)   Wt 245 lb (111.1 kg)   LMP 04/01/2008   SpO2 99%   BMI 38.37 kg/m   Assessment and Plan: 1. Essential hypertension Controlled - continue current therapy - hydrochlorothiazide (HYDRODIURIL) 25 MG tablet; Take 1 tablet (25 mg total) by mouth daily.  Dispense: 90 tablet; Refill: 3  2. Angioedema, subsequent encounter Refer to ENT - Ambulatory referral to ENT  3. Environmental and seasonal allergies Can also assess sinus sx - Ambulatory referral to ENT   Halina Maidens, MD [facility]  01/02/2016

## 2016-01-15 ENCOUNTER — Other Ambulatory Visit: Payer: Self-pay | Admitting: Otolaryngology

## 2016-01-15 DIAGNOSIS — Q382 Macroglossia: Secondary | ICD-10-CM

## 2016-01-15 DIAGNOSIS — J3489 Other specified disorders of nose and nasal sinuses: Secondary | ICD-10-CM

## 2016-01-22 ENCOUNTER — Ambulatory Visit: Payer: 59

## 2016-01-23 ENCOUNTER — Ambulatory Visit
Admission: RE | Admit: 2016-01-23 | Discharge: 2016-01-23 | Disposition: A | Discharge status: Home / Self Care | Payer: 59 | Source: Ambulatory Visit | Attending: Otolaryngology | Admitting: Otolaryngology

## 2016-01-23 DIAGNOSIS — Q382 Macroglossia: Secondary | ICD-10-CM | POA: Diagnosis present

## 2016-01-23 DIAGNOSIS — K148 Other diseases of tongue: Secondary | ICD-10-CM | POA: Diagnosis not present

## 2016-01-23 DIAGNOSIS — J351 Hypertrophy of tonsils: Secondary | ICD-10-CM | POA: Insufficient documentation

## 2016-01-23 DIAGNOSIS — R599 Enlarged lymph nodes, unspecified: Secondary | ICD-10-CM | POA: Insufficient documentation

## 2016-01-23 DIAGNOSIS — J3489 Other specified disorders of nose and nasal sinuses: Secondary | ICD-10-CM | POA: Insufficient documentation

## 2016-01-23 MED ORDER — IOPAMIDOL (ISOVUE-300) INJECTION 61%
75.0000 mL | Freq: Once | INTRAVENOUS | Status: AC | PRN
  Administered 2016-01-23: 75 mL via INTRAVENOUS

## 2016-02-05 ENCOUNTER — Inpatient Hospital Stay: Payer: 59 | Attending: Hematology and Oncology

## 2016-02-05 ENCOUNTER — Other Ambulatory Visit
Admission: RE | Admit: 2016-02-05 | Discharge: 2016-02-05 | Disposition: A | Discharge status: Home / Self Care | Payer: 59 | Source: Ambulatory Visit | Attending: Otolaryngology | Admitting: Otolaryngology

## 2016-02-05 ENCOUNTER — Inpatient Hospital Stay (HOSPITAL_BASED_OUTPATIENT_CLINIC_OR_DEPARTMENT_OTHER): Payer: 59 | Admitting: Hematology and Oncology

## 2016-02-05 ENCOUNTER — Encounter: Payer: Self-pay | Admitting: Hematology and Oncology

## 2016-02-05 VITALS — BP 135/78 | HR 92 | Temp 97.9°F | Resp 18 | Wt 245.8 lb

## 2016-02-05 DIAGNOSIS — C50412 Malignant neoplasm of upper-outer quadrant of left female breast: Secondary | ICD-10-CM | POA: Insufficient documentation

## 2016-02-05 DIAGNOSIS — Z8041 Family history of malignant neoplasm of ovary: Secondary | ICD-10-CM

## 2016-02-05 DIAGNOSIS — Z803 Family history of malignant neoplasm of breast: Secondary | ICD-10-CM

## 2016-02-05 DIAGNOSIS — Z800 Family history of malignant neoplasm of digestive organs: Secondary | ICD-10-CM | POA: Insufficient documentation

## 2016-02-05 DIAGNOSIS — C50912 Malignant neoplasm of unspecified site of left female breast: Secondary | ICD-10-CM

## 2016-02-05 DIAGNOSIS — K148 Other diseases of tongue: Secondary | ICD-10-CM

## 2016-02-05 DIAGNOSIS — Z8619 Personal history of other infectious and parasitic diseases: Secondary | ICD-10-CM | POA: Insufficient documentation

## 2016-02-05 DIAGNOSIS — Z923 Personal history of irradiation: Secondary | ICD-10-CM | POA: Diagnosis not present

## 2016-02-05 DIAGNOSIS — Z85828 Personal history of other malignant neoplasm of skin: Secondary | ICD-10-CM

## 2016-02-05 DIAGNOSIS — E669 Obesity, unspecified: Secondary | ICD-10-CM | POA: Insufficient documentation

## 2016-02-05 DIAGNOSIS — Z79899 Other long term (current) drug therapy: Secondary | ICD-10-CM | POA: Insufficient documentation

## 2016-02-05 DIAGNOSIS — R5383 Other fatigue: Secondary | ICD-10-CM | POA: Diagnosis not present

## 2016-02-05 DIAGNOSIS — Z170 Estrogen receptor positive status [ER+]: Secondary | ICD-10-CM | POA: Insufficient documentation

## 2016-02-05 DIAGNOSIS — Q382 Macroglossia: Secondary | ICD-10-CM | POA: Insufficient documentation

## 2016-02-05 LAB — CBC WITH DIFFERENTIAL/PLATELET
Basophils Absolute: 0.1 10*3/uL (ref 0–0.1)
Basophils Relative: 1 %
Eosinophils Absolute: 0.1 10*3/uL (ref 0–0.7)
Eosinophils Relative: 1 %
HCT: 35.6 % (ref 35.0–47.0)
Hemoglobin: 12 g/dL (ref 12.0–16.0)
Lymphocytes Relative: 39 %
Lymphs Abs: 3.1 10*3/uL (ref 1.0–3.6)
MCH: 28.4 pg (ref 26.0–34.0)
MCHC: 33.8 g/dL (ref 32.0–36.0)
MCV: 84 fL (ref 80.0–100.0)
Monocytes Absolute: 0.6 10*3/uL (ref 0.2–0.9)
Monocytes Relative: 7 %
Neutro Abs: 4 10*3/uL (ref 1.4–6.5)
Neutrophils Relative %: 52 %
Platelets: 333 10*3/uL (ref 150–440)
RBC: 4.24 MIL/uL (ref 3.80–5.20)
RDW: 14.1 % (ref 11.5–14.5)
WBC: 7.9 10*3/uL (ref 3.6–11.0)

## 2016-02-05 LAB — COMPREHENSIVE METABOLIC PANEL
ALT: 13 U/L — ABNORMAL LOW (ref 14–54)
AST: 21 U/L (ref 15–41)
Albumin: 3.6 g/dL (ref 3.5–5.0)
Alkaline Phosphatase: 64 U/L (ref 38–126)
Anion gap: 9 (ref 5–15)
BUN: 14 mg/dL (ref 6–20)
CO2: 27 mmol/L (ref 22–32)
Calcium: 8.7 mg/dL — ABNORMAL LOW (ref 8.9–10.3)
Chloride: 100 mmol/L — ABNORMAL LOW (ref 101–111)
Creatinine, Ser: 1 mg/dL (ref 0.44–1.00)
GFR calc Af Amer: >60 mL/min (ref >60)
GFR calc non Af Amer: >60 mL/min (ref >60)
Glucose, Bld: 113 mg/dL — ABNORMAL HIGH (ref 65–99)
Potassium: 3.4 mmol/L — ABNORMAL LOW (ref 3.5–5.1)
Sodium: 136 mmol/L (ref 135–145)
Total Bilirubin: 0.6 mg/dL (ref 0.3–1.2)
Total Protein: 7.2 g/dL (ref 6.5–8.1)

## 2016-02-05 LAB — T4, FREE: Free T4: 0.75 ng/dL (ref 0.61–1.12)

## 2016-02-05 NOTE — Progress Notes (Signed)
[facility] day:  02/05/2016   Chief Complaint: Heather Dudley is a 55 y.o. female with stage IIA left breast cancer who is seen for 3 month assessment on Aromasin.  HPI:  The patient was last seen in the medical oncology clinic on 11/06/2015.  At that time, she was doing well on Aromasin.  Bone density study on 12/04/2015 was normal with a T-score of -0.9 in the AP spine L1-L4 and -0.6 in the left femoral neck.  During the interim, she states that she feels "good, I guess". She notes interval tongue swelling felt possibly medication related. Her primary care physician discontinued lisinopril as well as Aromasin. She is now on hydrochlorothiazide for her blood pressure.  She remains off of Aromasin.  Despite discontinuation of medications, her tongue remains enlarged.  She has been seen by ENT.  Thyroid function studies are being performed.  An MRI is scheduled.  She has a follow-up appointment on 02/10/2016.  Symptomatically, her tongue feels heavy.  She denies any difficulty swallowing or shortness of breath.  She denies any breast concerns.   Past Medical History:  Diagnosis Date  . Basal cell carcinoma of forehead    birthmark of forehead  . Breast screening, unspecified   . Cellulitis and abscess of trunk   . Hernia   . History of chemotherapy 2012  . Hypertension   . Lump or mass in breast   . Malignant neoplasm of breast (female), unspecified site    She underwent wide excision, mastoplasty and axillary dissection for her left breast malignancy on September 03, 2010. The primary tumor was 1 cm in diameter, and a single positive axillary node 1.3 cm in diameter. This was an ER-positive, PR slightly positive, HER-2/neu not overexpressing tumor. She tolerated her whole breast radiation without difficulty ending in late February 2013.  . Malignant neoplasm of upper-outer quadrant of female breast ([place])   . Neoplasm of uncertain behavior  of connective and other soft tissue   . Obesity, unspecified   . Personal history of malignant neoplasm of breast    The patient underwent wide excision, mastoplasty. Dissection for a T1b, N1, M0 carcinoma left breast on September 03, 2010.The primary tumor was 1 cm in diameter, and a single positive axillary node 1.3 cm in diameter. This was an ER-positive, PR slightly positive, HER-2/neu not overexpressing tumor.  The patient completed whole breast radiation in February 2013.  [name] Screening for obesity   . Thyroid nodule     Past Surgical History:  Procedure Laterality Date  . ABDOMINAL HYSTERECTOMY  2011  . birth mark removed    . BREAST CYST ASPIRATION Right    negative 01/2010  . BREAST EXCISIONAL BIOPSY  August 05, 2010   left breast positive 07/2010  . BREAST LUMPECTOMY  2012   left breast  . COLONOSCOPY  July 29, 2012   normal study.  . THYROID SURGERY     partially removed  . TONSILLECTOMY    . TUBAL LIGATION      Family History  Problem Relation Age of Onset  . Breast cancer Other   . Colon cancer Other   . Ovarian cancer Other     Social History:  reports that she has never smoked. She has never used smokeless tobacco. She reports that she does not drink alcohol or use drugs.  She is from [place].  The patient is alone today.  Allergies:  Allergies  Allergen Reactions  .  Ace Inhibitors Swelling  . Penicillins Rash    Current Medications: Current Outpatient Prescriptions  Medication Sig Dispense Refill  . b complex vitamins tablet Take 1 tablet by mouth daily.    . Cholecalciferol (VITAMIN D) 2000 units CAPS Take 1 capsule (2,000 Units total) by mouth daily. 30 capsule   . hydrochlorothiazide (HYDRODIURIL) 25 MG tablet Take 1 tablet (25 mg total) by mouth daily. 90 tablet 3  . Multiple Vitamins-Minerals (MULTIVITAMIN PO) Take by mouth daily. Reported on 05/15/2015    . Omega 3 1000 MG CAPS Take 1,200 mg by mouth 2 (two) times daily.     [name] exemestane (AROMASIN) 25 MG  tablet Take 1 tablet (25 mg total) by mouth daily after breakfast. (Patient not taking: Reported on 02/05/2016) 30 tablet 4  . venlafaxine XR (EFFEXOR-XR) 150 MG 24 hr capsule Take 1 capsule (150 mg total) by mouth daily with breakfast. (Patient not taking: Reported on 02/05/2016) 30 capsule 3   No current facility-administered medications for this visit.     Review of Systems:  GENERAL:  Feels "good, I guess".  No fevers or sweats.  Weight up 2 pounds. PERFORMANCE STATUS (ECOG):  0 HEENT:  Tongue swelling.  No visual changes, runny nose, sore throat, mouth sores or tenderness. Lungs: No shortness of breath or cough.  No hemoptysis. Cardiac:  No chest pain, palpitations, orthopnea, or PND. GI:  No nausea, vomiting, diarrhea, constipation, melena or hematochezia. GU:  No urgency, frequency, dysuria, or hematuria. Musculoskeletal:  No back pain.  No joint pain.  No muscle tenderness. Extremities:  No pain or swelling. Skin:  No rashes or skin changes. Neuro:  No headache, numbness or weakness, balance or coordination issues. Endocrine:  s/p partial thyroidectomy.  No diabetes.  Hot flashes.  No night sweats. Psych:  No mood changes, depression or anxiety. Pain:  No focal pain. Review of systems:  All other systems reviewed and found to be negative.  Physical Exam: Blood pressure 135/78, pulse 92, temperature 97.9 F (36.6 C), temperature source Tympanic, resp. rate 18, weight 245 lb 13 oz (111.5 kg), last menstrual period 04/01/2008. GENERAL:  Well developed, well nourished, heavyset woman sitting comfortably in the exam room in no acute distress. MENTAL STATUS:  Alert and oriented to person, place and time. HEAD:  Short brown hair.  Normocephalic, atraumatic, face symmetric, no Cushingoid features. EYES:  Brown eyes.  Pupils equal round and reactive to light and accomodation.  No conjunctivitis or scleral icterus. ENT:  Oropharynx clear without lesion.  Mild macroglossia (thick tongue).   Mucous membranes moist.  RESPIRATORY:  Clear to auscultation without rales, wheezes or rhonchi. CARDIOVASCULAR:  Regular rate and rhythm without murmur, rub or gallop. BREAST:  Right breast without masses, skin changes or nipple discharge.  Left breast s/p lumpectomy at the 9 o'clock position.  No discrete masses, skin changes or nipple discharge.  ABDOMEN:  Soft, non-tender, with active bowel sounds, and no appreciable hepatosplenomegaly.  No masses. SKIN:  No rashes, ulcers or lesions. EXTREMITIES: No edema, no skin discoloration or tenderness.  No palpable cords. LYMPH NODES: No palpable cervical, supraclavicular, axillary or inguinal adenopathy  NEUROLOGICAL: Unremarkable. PSYCH:  Appropriate.   Appointment on 02/05/2016  Component Date Value Ref Range Status  . WBC 02/05/2016 7.9  3.6 - 11.0 K/uL Final  . RBC 02/05/2016 4.24  3.80 - 5.20 MIL/uL Final  . Hemoglobin 02/05/2016 12.0  12.0 - 16.0 g/dL Final  . HCT 02/05/2016 35.6  35.0 - 47.0 %  Final  . MCV 02/05/2016 84.0  80.0 - 100.0 fL Final  . MCH 02/05/2016 28.4  26.0 - 34.0 pg Final  . MCHC 02/05/2016 33.8  32.0 - 36.0 g/dL Final  . RDW 02/05/2016 14.1  11.5 - 14.5 % Final  . Platelets 02/05/2016 333  150 - 440 K/uL Final  . Neutrophils Relative % 02/05/2016 52  % Final  . Neutro Abs 02/05/2016 4.0  1.4 - 6.5 K/uL Final  . Lymphocytes Relative 02/05/2016 39  % Final  . Lymphs Abs 02/05/2016 3.1  1.0 - 3.6 K/uL Final  . Monocytes Relative 02/05/2016 7  % Final  . Monocytes Absolute 02/05/2016 0.6  0.2 - 0.9 K/uL Final  . Eosinophils Relative 02/05/2016 1  % Final  . Eosinophils Absolute 02/05/2016 0.1  0 - 0.7 K/uL Final  . Basophils Relative 02/05/2016 1  % Final  . Basophils Absolute 02/05/2016 0.1  0 - 0.1 K/uL Final  . Sodium 02/05/2016 136  135 - 145 mmol/L Final  . Potassium 02/05/2016 3.4* 3.5 - 5.1 mmol/L Final  . Chloride 02/05/2016 100* 101 - 111 mmol/L Final  . CO2 02/05/2016 27  22 - 32 mmol/L Final  . Glucose,  Bld 02/05/2016 113* 65 - 99 mg/dL Final  . BUN 02/05/2016 14  6 - 20 mg/dL Final  . Creatinine, Ser 02/05/2016 1.00  0.44 - 1.00 mg/dL Final  . Calcium 02/05/2016 8.7* 8.9 - 10.3 mg/dL Final  . Total Protein 02/05/2016 7.2  6.5 - 8.1 g/dL Final  . Albumin 02/05/2016 3.6  3.5 - 5.0 g/dL Final  . AST 02/05/2016 21  15 - 41 U/L Final  . ALT 02/05/2016 13* 14 - 54 U/L Final  . Alkaline Phosphatase 02/05/2016 64  38 - 126 U/L Final  . Total Bilirubin 02/05/2016 0.6  0.3 - 1.2 mg/dL Final  . GFR calc non Af Amer 02/05/2016 >60  >60 mL/min Final  . GFR calc Af Amer 02/05/2016 >60  >60 mL/min Final   Comment: (NOTE) The eGFR has been calculated using the CKD EPI equation. This calculation has not been validated in all clinical situations. eGFR's persistently <60 mL/min signify possible Chronic Kidney Disease.   . Anion gap 02/05/2016 9  5 - 15 Final    Assessment:  Heather Dudley is a 55 y.o. female with stage IIA left breast cancer s/p wide excision and axillary lymph node dissection on 09/03/2010.  Pathology revealed a 1.0 cm grade II invasive ductal carcinoma. One of 14 lymph nodes were positive macrometastasis of 1.3 cm.  Pathologic stage was [id number].  Tumor was ER positive (90%), PR positive (20%), and HER-2/neu negative by FISH (Her-2/CEP17 ratio < 1.8).  She enrolled on NSABP B-47. She received AC 4 followed by weekly paclitaxel (completed on 02/06/2011).  She received breast radiation in 03/2011.  She completed a year of adjuvant Herceptin on 11/13/2011. She was on Arimidex from 07/2011 - 07/2015 and Femara from 09/04/2015 - 10/09/2015.  She stopped Arimidex and Femara secondary to vasomotor symptoms.  She began Aromasin on 10/09/2015  Breast cancer index (BCI) testing on 10/08/2015 revealed a high risk of late recurrence  Risk is estimated at 12.7% (CI: 7.4%-17.7%) during years 5-10.   She has a high likelihood of benefit from extended adjuvant hormonal therapy.  Bilateral  mammogram on 12/10/2014 revealed lumpectomy changes in the left breast without evidence of malignancy.    CA27.29 was 19.5 on 09/04/2015.  Bone density study on 12/04/2015 was normal with a  T-score of -0.9 in the AP spine L1-L4 and -0.6 in the left femoral neck.  She has vasomotor symptoms well managed on Effexor.  Symptomatically, she has new macroglossia.  Aromasin and lisinopril were discontinued without change in symptoms.  Vasomotor symptoms are managed with Effexor.  Plan: 1.  Labs today:  CBC with diff, CMP, CA27.29. 2.  Additional labs today to evaluate macroglossia (r/o amyloid)- myeloma panel, free light chains, 24 hour UPEP with free light chains. 3.  Follow-up MRI next week. 4.  Review bone density study- normal.  Continue calcium and vitamin D. 5.  Remain off Aromasin for now.  Consider initiation of alternate hormonal management of beast cancer if etiology of macroglossia unclear. 6.  RTC in 2 weeks for review of labs and interval imaging studies.   Lequita Asal, MD  02/05/2016, 3:25 PM

## 2016-02-05 NOTE — Progress Notes (Signed)
Patient is no longer taking exemestane.  She had problems with her tongue swelling and PCP d/c lisinopril and exemestane. Referred patient to ENT.   X-ray done of her throat which revealed the back of her throat swollen as well.  At that time the patient had a cold and it is unsure if the swelling was from medications. Follow up appointment on 02-11-16.   Also checking thyroid and getting an MRI of neck.

## 2016-02-06 LAB — CANCER ANTIGEN 27.29: CA 27.29: 17.6 U/mL (ref 0.0–38.6)

## 2016-02-06 LAB — T3 UPTAKE: T3 Uptake Ratio: 26 % (ref 24–39)

## 2016-02-06 LAB — KAPPA/LAMBDA LIGHT CHAINS
Kappa free light chain: 17.9 mg/L (ref 3.3–19.4)
Kappa, lambda light chain ratio: 0.97 (ref 0.26–1.65)
Lambda free light chains: 18.5 mg/L (ref 5.7–26.3)

## 2016-02-09 DIAGNOSIS — C50412 Malignant neoplasm of upper-outer quadrant of left female breast: Secondary | ICD-10-CM | POA: Diagnosis not present

## 2016-02-10 ENCOUNTER — Other Ambulatory Visit: Payer: Self-pay

## 2016-02-10 DIAGNOSIS — Q382 Macroglossia: Secondary | ICD-10-CM

## 2016-02-10 LAB — MULTIPLE MYELOMA PANEL, SERUM
Albumin SerPl Elph-Mcnc: 3.3 g/dL (ref 2.9–4.4)
Albumin/Glob SerPl: 0.9 (ref 0.7–1.7)
Alpha 1: 0.2 g/dL (ref 0.0–0.4)
Alpha2 Glob SerPl Elph-Mcnc: 1 g/dL (ref 0.4–1.0)
B-Globulin SerPl Elph-Mcnc: 1.4 g/dL — ABNORMAL HIGH (ref 0.7–1.3)
Gamma Glob SerPl Elph-Mcnc: 1.3 g/dL (ref 0.4–1.8)
Globulin, Total: 4 g/dL — ABNORMAL HIGH (ref 2.2–3.9)
IgA: 424 mg/dL — ABNORMAL HIGH (ref 87–352)
IgG (Immunoglobin G), Serum: 1213 mg/dL (ref 700–1600)
IgM, Serum: 57 mg/dL (ref 26–217)
Total Protein ELP: 7.3 g/dL (ref 6.0–8.5)

## 2016-02-12 LAB — IFE+PROTEIN ELECTRO, 24-HR UR
% BETA, Urine: 22.2 %
ALPHA 1 URINE: 1.8 %
Albumin, U: 42.1 %
Alpha 2, Urine: 14 %
GAMMA GLOBULIN URINE: 20 %
PDF: 0
Total Protein, Urine-Ur/day: 182 mg/24 hr — ABNORMAL HIGH (ref 30–150)
Total Protein, Urine: 12.1 mg/dL
Total Volume: 1500

## 2016-02-19 ENCOUNTER — Inpatient Hospital Stay (HOSPITAL_BASED_OUTPATIENT_CLINIC_OR_DEPARTMENT_OTHER): Payer: 59 | Admitting: Hematology and Oncology

## 2016-02-19 ENCOUNTER — Encounter: Payer: Self-pay | Admitting: Hematology and Oncology

## 2016-02-19 DIAGNOSIS — Z803 Family history of malignant neoplasm of breast: Secondary | ICD-10-CM

## 2016-02-19 DIAGNOSIS — Z85828 Personal history of other malignant neoplasm of skin: Secondary | ICD-10-CM

## 2016-02-19 DIAGNOSIS — Z8619 Personal history of other infectious and parasitic diseases: Secondary | ICD-10-CM

## 2016-02-19 DIAGNOSIS — E669 Obesity, unspecified: Secondary | ICD-10-CM | POA: Diagnosis not present

## 2016-02-19 DIAGNOSIS — K148 Other diseases of tongue: Secondary | ICD-10-CM | POA: Diagnosis not present

## 2016-02-19 DIAGNOSIS — C50412 Malignant neoplasm of upper-outer quadrant of left female breast: Secondary | ICD-10-CM

## 2016-02-19 DIAGNOSIS — Z800 Family history of malignant neoplasm of digestive organs: Secondary | ICD-10-CM

## 2016-02-19 DIAGNOSIS — Z923 Personal history of irradiation: Secondary | ICD-10-CM

## 2016-02-19 DIAGNOSIS — C50912 Malignant neoplasm of unspecified site of left female breast: Secondary | ICD-10-CM

## 2016-02-19 DIAGNOSIS — Z170 Estrogen receptor positive status [ER+]: Secondary | ICD-10-CM

## 2016-02-19 DIAGNOSIS — Q382 Macroglossia: Secondary | ICD-10-CM

## 2016-02-19 DIAGNOSIS — Z79899 Other long term (current) drug therapy: Secondary | ICD-10-CM

## 2016-02-19 DIAGNOSIS — Z8041 Family history of malignant neoplasm of ovary: Secondary | ICD-10-CM

## 2016-02-19 NOTE — Progress Notes (Signed)
[facility] FOLLOW-UP progress notes  Patient Care Team: Glean Hess, MD as PCP - General (Family Medicine) Robert Bellow, MD as Consulting Physician (General Surgery)  CHIEF COMPLAINTS/PURPOSE OF VISIT:  ER positive breast cancer, tongue swelling  HISTORY OF PRESENTING ILLNESS:  Heather Dudley 55 y.o. female was transferred to my care today because her primary physician is not available.  I reviewed the patient's records extensive and collaborated the history with the patient. Summary of her history is as follows: Oncology History   pT1b pN1 cMx, grade 2 invasive ductal carcinoma of the left breast status post lumpectomy, sentinel node study and axillary node dissection 09/03/2010. Tumor size 1.0 cm, grade 2, margins clear.  One of 14 left axillary lymph nodes showed macrometastasis of 1.3 cm focus. ER positive (90%), PR positive (20%), HER-2/neu negative by FISH (HER-2/CEP17 ratio < 1.80).  2. Incidental finding of right axillary lymphadenopathy on MRI, negative on PET.  Patient on adjuvant chemotherapy, got AC x 4, then completed weekly paclitaxel on 02/06/11, completed Herceptin on 11/13/11  (has enrolled on NSABP B-47).  3. On Anastrazole since June 2013.      Breast cancer, left breast ([facility])   09/02/2010 Initial Diagnosis    Breast cancer, left breast [facility])      Additional oncologic history: She was young at diagnosis. Due to history of hysterectomy, her menopausal state was unknown. Her ovaries are intact  She enrolled on NSABP B-47. She received AC 4 followed by weekly paclitaxel (completed on 02/06/2011).  She received breast radiation in 03/2011.  She completed a year of adjuvant Herceptin on 11/13/2011. She was on Arimidex from 07/2011 - 07/2015 and Femara from 09/04/2015 - 10/09/2015.  She stopped Arimidex and Femara secondary to vasomotor symptoms.  She began Aromasin on 10/09/2015  Breast cancer index (BCI) testing on 10/08/2015  revealed a high risk of late recurrence  Risk is estimated at 12.7% (CI: 7.4%-17.7%) during years 5-10.   She has a high likelihood of benefit from extended adjuvant hormonal therapy.  Bilateral mammogram on 12/10/2014 revealed lumpectomy changes in the left breast without evidence of malignancy.    CA27.29 was 19.5 on 09/04/2015.  Bone density study on 12/04/2015 was normal with a T-score of -0.9 in the AP spine L1-L4 and -0.6 in the left femoral neck.  She has vasomotor symptoms well managed on Effexor.  Symptomatically, she has recent macroglossia.  Aromasin and lisinopril were discontinued without change in symptoms.  Vasomotor symptoms are managed with Effexor.  She feels well since discontinuation of Aromasin. Hot flashes has almost completely disappeared. She has mild upper respiratory symptoms. No problems with swallowing She denies any recent abnormal breast examination, palpable mass, abnormal breast appearance or nipple changes   MEDICAL HISTORY:  Past Medical History:  Diagnosis Date  . Basal cell carcinoma of forehead    birthmark of forehead  . Breast screening, unspecified   . Cellulitis and abscess of trunk   . Hernia   . History of chemotherapy 2012  . Hypertension   . Lump or mass in breast   . Malignant neoplasm of breast (female), unspecified site    She underwent wide excision, mastoplasty and axillary dissection for her left breast malignancy on September 03, 2010. The primary tumor was 1 cm in diameter, and a single positive axillary node 1.3 cm in diameter. This was an ER-positive, PR slightly positive, HER-2/neu not overexpressing tumor. She tolerated her whole breast radiation without difficulty ending in late  February 2013.  . Malignant neoplasm of upper-outer quadrant of female breast ([place])   . 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 Obesity, unspecified   . Personal history of malignant neoplasm of breast    The patient underwent  wide excision, mastoplasty. Dissection for a T1b, N1, M0 carcinoma left breast on September 03, 2010.The primary tumor was 1 cm in diameter, and a single positive axillary node 1.3 cm in diameter. This was an ER-positive, PR slightly positive, HER-2/neu not overexpressing tumor.  The patient completed whole breast radiation in February 2013.  [name] Screening for obesity   . Thyroid nodule     SURGICAL HISTORY: Past Surgical History:  Procedure Laterality Date  . ABDOMINAL HYSTERECTOMY  2011  . birth mark removed    . BREAST CYST ASPIRATION Right    negative 01/2010  . BREAST EXCISIONAL BIOPSY  August 05, 2010   left breast positive 07/2010  . BREAST LUMPECTOMY  2012   left breast  . COLONOSCOPY  July 29, 2012   normal study.  . THYROID SURGERY     partially removed  . TONSILLECTOMY    . TUBAL LIGATION      SOCIAL HISTORY: Social History   Social History  . Marital status: Married    Spouse name: N/A  . Number of children: 3  . Years of education: N/A   Occupational History  . Not on file.   Social History Main Topics  . Smoking status: Never Smoker  . Smokeless tobacco: Never Used  . Alcohol use No  . Drug use: No  . Sexual activity: Not on file   Other Topics Concern  . Not on file   Social History Narrative  . No narrative on file    FAMILY HISTORY: Family History  Problem Relation Age of Onset  . Breast cancer Other   . Colon cancer Other   . Ovarian cancer Other   . Cancer Mother     lung ca  . Cancer Maternal Uncle     lung ca  . Cancer Paternal Aunt     ovarian ca  . Cancer Cousin     female ca    ALLERGIES:  is allergic to ace inhibitors and penicillins.  MEDICATIONS:  Current Outpatient Prescriptions  Medication Sig Dispense Refill  . b complex vitamins tablet Take 1 tablet by mouth daily.    . Cholecalciferol (VITAMIN D) 2000 units CAPS Take 1 capsule (2,000 Units total) by mouth daily. 30 capsule   . hydrochlorothiazide (HYDRODIURIL) 25 MG tablet  Take 1 tablet (25 mg total) by mouth daily. 90 tablet 3  . Multiple Vitamins-Minerals (MULTIVITAMIN PO) Take by mouth daily. Reported on 05/15/2015    . Omega 3 1000 MG CAPS Take 1,200 mg by mouth 2 (two) times daily.     [name] exemestane (AROMASIN) 25 MG tablet Take 1 tablet (25 mg total) by mouth daily after breakfast. (Patient not taking: Reported on 02/19/2016) 30 tablet 4  . venlafaxine XR (EFFEXOR-XR) 150 MG 24 hr capsule Take 1 capsule (150 mg total) by mouth daily with breakfast. (Patient not taking: Reported on 02/19/2016) 30 capsule 3   No current facility-administered medications for this visit.     REVIEW OF SYSTEMS:   Constitutional: Denies fevers, chills or abnormal night sweats Eyes: Denies blurriness of vision, double vision or watery eyes Ears, nose, mouth, throat, and face: Denies mucositis or sore throat Respiratory: Denies cough, dyspnea or wheezes Cardiovascular:  Denies palpitation, chest discomfort or lower extremity swelling Gastrointestinal:  Denies nausea, heartburn or change in bowel habits Skin: Denies abnormal skin rashes Lymphatics: Denies new lymphadenopathy or easy bruising Neurological:Denies numbness, tingling or new weaknesses Behavioral/Psych: Mood is stable, no new changes  All other systems were reviewed with the patient and are negative.  PHYSICAL EXAMINATION: ECOG PERFORMANCE STATUS: 0 - Asymptomatic  Vitals:   02/19/16 1011  BP: 133/82  Pulse: 98  Temp: 98.3 F (36.8 C)   Filed Weights   02/19/16 1011  Weight: 242 lb 4.6 oz (109.9 kg)    GENERAL:alert, no distress and comfortable SKIN: skin color, texture, turgor are normal, no rashes or significant lesions EYES: normal, conjunctiva are pink and non-injected, sclera clear OROPHARYNX:no exudate, normal lips, buccal mucosa, and tongue  PSYCH: alert & oriented x 3 with fluent speech NEURO: no focal motor/sensory deficits  LABORATORY DATA:  I have reviewed the data as listed Lab Results   Component Value Date   WBC 7.9 02/05/2016   HGB 12.0 02/05/2016   HCT 35.6 02/05/2016   MCV 84.0 02/05/2016   PLT 333 02/05/2016    Recent Labs  09/04/15 0924 10/09/15 1552 11/06/15 1528 02/05/16 1408  NA 137 137  --  136  K 3.8 3.4*  --  3.4*  CL 99* 100*  --  100*  CO2 33* 30  --  27  GLUCOSE 104* 102*  --  113*  BUN 15 18  --  14  CREATININE 1.03* 1.14*  --  1.00  CALCIUM 9.4 9.3  --  8.7*  GFRNONAA >60 54*  --  >60  GFRAA >60 >60  --  >60  PROT 7.7 7.6 8.0 7.2  ALBUMIN 3.9 4.2 4.3 3.6  AST _0 ALT _1 13*  ALKPHOS 77 65 79 64  BILITOT 0.7 0.5 0.2* 0.6  BILIDIR  --  <0.1* <0.1*  --   IBILI  --  NOT CALCULATED NOT CALCULATED  --     RADIOGRAPHIC STUDIES: I have personally reviewed the radiological images as listed and agreed with the findings in the report. Ct Soft Tissue Neck W Contrast  Result Date: 01/23/2016 CLINICAL DATA:  55 year old female with tongue swelling for 3-4 months. Nasal obstruction. Personal history of partial thyroidectomy reportedly for benign disease. Initial encounter. EXAM: CT NECK WITH CONTRAST TECHNIQUE: Multidetector CT imaging of the neck was performed using the standard protocol following the bolus administration of intravenous contrast. CONTRAST:  75m ISOVUE-300 IOPAMIDOL (ISOVUE-300) INJECTION 61% COMPARISON:  Head CT without contrast 04/16/2015. FINDINGS: Pharynx and larynx: Laryngeal contours are normal. Bulky appearing soft tissue at the vallecula seems to represent lingual tonsil hypertrophy (see sagittal image 42). There is associated partial effacement of the posterior pharyngeal wall (sagittal image 46). The palatine tonsils appear surgically absent. Other oral pharyngeal soft tissue contours are normal. Nasopharyngeal contours are within normal limits. Negative parapharyngeal and retropharyngeal spaces. No oral tongue mass or heterogeneity identified. No oral cavity abnormality identified. Salivary glands: Sublingual  space, submandibular glands and parotid glands are normal. Thyroid: Absent left thyroid lobe. Mildly enlarged but otherwise negative thyroid isthmus and right lobe. No discrete thyroid nodule. Lymph nodes: Maximal to mildly enlarged lymph nodes at the junction of the level 2/3 nodal stations. The largest node is on the right at this level measuring 10 mm short axis (series 2, image 36 and sagittal image 29). A contralateral 8 mm node is present on series 2, image 40. Most  other bilateral level 2 and level 3 nodes are 7 mm short axis or less. No cystic or necrotic nodes. No level 1, level 5, or level 4 nodal enlargement. Vascular: Major vascular structures in the neck and at the skullbase are patent. No cervical carotid atherosclerosis. Limited intracranial: Negative. Visualized orbits: Negative. Mastoids and visualized paranasal sinuses: Clear. Skeleton: No acute osseous abnormality identified. No acute dental findings identified. Nonspecific reversal of cervical lordosis. Upper chest: Mild left apical lung scarring. Otherwise negative visualized lung parenchyma. No superior mediastinal lymphadenopathy. Visible axillary nodes are normal. IMPRESSION: 1. Bulky soft tissue enlargement at the tongue base/vallecula. This partially effaces the left oropharynx. Favor lingual tonsil hypertrophy in the setting of prior tonsillectomy, but tongue base tumor is difficult to exclude. 2. Maximal to mildly enlarged lymph nodes at the bilateral level 2/3 nodal stations. The largest node is 10 mm short axis on the right as seen on sagittal image 29. These could be reactive, with no cystic or overtly malignant nodes. 3. No oral tongue abnormality identified. Other pharyngeal and laryngeal soft tissue contours are normal. 4. Previous left thyroidectomy. Electronically Signed   By: Genevie Ann M.D.   On: 01/23/2016 09:43    ASSESSMENT & PLAN:  Breast cancer, left breast ([place]) The patient was likely premenopausal at the time of  diagnosis. She did not have her hormones levels checked and was placed on aromatase inhibitor. She had difficulties tolerated treatment due to side-effects especially with hot flashes Most of these symptoms had resolved since discontinuation of treatment (due to perceived possible side-effect causing tongue swelling) Summary of today's findings & recommendations: 1) I do not believe her tongue swelling is due to Aromasin. She can resume Aromasin after ENT evaluation is complete 2) I think she will benefit from genetic testing as she is diagnosed at age younger than 35 and positive family history of ovarian cancer. We discussed implication of genetic testing; she will think about it 3) Also, she is overdue for mammogram. She will reschedule. After mammogram, she may benefit from periodic additional screening modality with MRI breasts  Macroglossia This is not compatible with amyloidosis She has appointment to [facility] for second opinion due to persistent hypertrophy of tonsillar tissues The elevated IgA is compatible with recent upper URI She has mild persistent congestive symptoms; I recommend over the counter remedies only    No orders of the defined types were placed in this encounter.   All questions were answered. The patient knows to call the clinic with any problems, questions or concerns. I spent 25 minutes counseling the patient face to face. The total time spent in the appointment was 30 minutes and more than 50% was on counseling.     Heath Lark, MD 02/19/2016 11:29 AM

## 2016-02-19 NOTE — Assessment & Plan Note (Signed)
This is not compatible with amyloidosis She has appointment to [facility]NT for second opinion due to persistent hypertrophy of tonsillar tissues The elevated IgA is compatible with recent upper URI She has mild persistent congestive symptoms; I recommend over the counter remedies only

## 2016-02-19 NOTE — Assessment & Plan Note (Addendum)
The patient was likely premenopausal at the time of diagnosis. She did not have her hormones levels checked and was placed on aromatase inhibitor. She had difficulties tolerated treatment due to side-effects especially with hot flashes Most of these symptoms had resolved since discontinuation of treatment (due to perceived possible side-effect causing tongue swelling) Summary of today's findings & recommendations: 1) I do not believe her tongue swelling is due to Aromasin. She can resume Aromasin after ENT evaluation is complete 2) I think she will benefit from genetic testing as she is diagnosed at age younger than 34 and positive family history of ovarian cancer. We discussed implication of genetic testing; she will think about it 3) Also, she is overdue for mammogram. She will reschedule. After mammogram, she may benefit from periodic additional screening modality with MRI breasts

## 2016-03-02 ENCOUNTER — Ambulatory Visit
Admission: RE | Admit: 2016-03-02 | Discharge: 2016-03-02 | Disposition: A | Discharge status: Home / Self Care | Payer: 59 | Source: Ambulatory Visit | Attending: Radiation Oncology | Admitting: Radiation Oncology

## 2016-03-02 ENCOUNTER — Encounter: Payer: Self-pay | Admitting: Radiation Oncology

## 2016-03-02 VITALS — BP 149/94 | HR 89 | Temp 98.1°F | Wt 238.1 lb

## 2016-03-02 DIAGNOSIS — Z923 Personal history of irradiation: Secondary | ICD-10-CM | POA: Diagnosis not present

## 2016-03-02 DIAGNOSIS — Z853 Personal history of malignant neoplasm of breast: Secondary | ICD-10-CM | POA: Insufficient documentation

## 2016-03-02 DIAGNOSIS — Z170 Estrogen receptor positive status [ER+]: Secondary | ICD-10-CM

## 2016-03-02 DIAGNOSIS — C50912 Malignant neoplasm of unspecified site of left female breast: Secondary | ICD-10-CM

## 2016-03-02 NOTE — Progress Notes (Signed)
Radiation Oncology Follow up Note  Name: Heather Dudley   Date:   03/02/2016 MRN:  [id number] DOB: [birth year]    This 56 y.o. female presents to the clinic today for 5 year follow-up status post whole breast radiation for a T1 N1 invasive mammary carcinoma left breast ER/PR positive PR borderline HER-2/neu not overexpressed.  REFERRING PROVIDER: No ref. provider found  HPI: Patient is a 56 year old female now out 5 years having completed whole breast radiation to her left breast for a T1 N1 M0 invasive mammary carcinoma left breast as was wide local excision mastoplasty for ER positive PR borderline HER-2/neu not overexpressed disease. Seen today in routine follow-up she is doing well. Mammograms have been fine. She's not had one in a year and we are reordering diagnostic bilateral mammograms today. She specifically denies breast tenderness cough or bone pain. She's also had problems with her anti-estrogen therapy is currently on hold by medical oncology's secondary to some tongue swelling..  COMPLICATIONS OF TREATMENT: none  FOLLOW UP COMPLIANCE: keeps appointments   PHYSICAL EXAM:  BP (!) 149/94   Pulse 89   Temp 98.1 F (36.7 C)   Wt 238 lb 1.6 oz (108 kg)   LMP 04/01/2008   BMI 37.29 kg/m  The left breast is markedly asymmetric to the right breast. No dominant mass or nodularity is noted in either breast in 2 positions examined. No axillary or supraclavicular adenopathy is appreciated. Well-developed well-nourished patient in NAD. HEENT reveals PERLA, EOMI, discs not visualized.  Oral cavity is clear. No oral mucosal lesions are identified. Neck is clear without evidence of cervical or supraclavicular adenopathy. Lungs are clear to A&P. Cardiac examination is essentially unremarkable with regular rate and rhythm without murmur rub or thrill. Abdomen is benign with no organomegaly or masses noted. Motor sensory and DTR levels are equal and symmetric in the upper and lower  extremities. Cranial nerves II through XII are grossly intact. Proprioception is intact. No peripheral adenopathy or edema is identified. No motor or sensory levels are noted. Crude visual fields are within normal range.  RADIOLOGY RESULTS: Past mammograms are reviewed showing benign findings  PLAN: Present time patient is doing well and going to discontinue follow-up care since were 5 years out. She continues follow-up care with medical oncology and will have further consultation about antiestrogen therapy. I'm scheduling bilateral diagnostic mammograms today. Patient knows in the future to call anytime with concerns.  I would like to take this opportunity to thank you for allowing me to participate in the care of your patient.Armstead Peaks., MD

## 2016-03-17 ENCOUNTER — Ambulatory Visit: Payer: 59 | Attending: Otolaryngology

## 2016-03-17 ENCOUNTER — Ambulatory Visit: Payer: 59

## 2016-03-17 DIAGNOSIS — F5101 Primary insomnia: Secondary | ICD-10-CM | POA: Insufficient documentation

## 2016-03-17 DIAGNOSIS — G4733 Obstructive sleep apnea (adult) (pediatric): Secondary | ICD-10-CM | POA: Diagnosis not present

## 2016-03-18 ENCOUNTER — Inpatient Hospital Stay: Payer: 59 | Admitting: Hematology and Oncology

## 2016-04-14 NOTE — Progress Notes (Deleted)
[facility] day:  04/14/2016   Chief Complaint: Heather Dudley is a 56 y.o. female with stage IIA left breast cancer who is seen for 3 month assessment on Aromasin.  HPI:  The patient was last seen in the medical oncology clinic on 02/05/2016.  At that time, she had new macroglossia.  Aromasin and lisinopril were discontinued without change in symptoms.  Vasomotor symptoms are managed with Effexor.  Labs included a normal CBC with diff and CMP.  CA27.29 was 17.6.  Additional labs were added to evaluate macroglossia (r/o amyloid).  SPEP revealed no monoclonal protein.  IgG and IgM were normal IgA was 424 (87-352).  Free light chains were normal.  24 hour UPEP was normal.  She was scheduled to undergo an MRI.  She is scheduled for transoral robotic lingual tonsillectomy on x/x/2018  Symptomatically,   Past Medical History:  Diagnosis Date  . Basal cell carcinoma of forehead    birthmark of forehead  . Breast screening, unspecified   . Cellulitis and abscess of trunk   . Hernia   . History of chemotherapy 2012  . Hypertension   . Lump or mass in breast   . Malignant neoplasm of breast (female), unspecified site    She underwent wide excision, mastoplasty and axillary dissection for her left breast malignancy on September 03, 2010. The primary tumor was 1 cm in diameter, and a single positive axillary node 1.3 cm in diameter. This was an ER-positive, PR slightly positive, HER-2/neu not overexpressing tumor. She tolerated her whole breast radiation without difficulty ending in late February 2013.  . Malignant neoplasm of upper-outer quadrant of female breast ([place])   . 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 Obesity, unspecified   . Personal history of malignant neoplasm of breast    The patient underwent wide excision, mastoplasty. Dissection for a T1b, N1, M0 carcinoma left breast on September 03, 2010.The primary tumor  was 1 cm in diameter, and a single positive axillary node 1.3 cm in diameter. This was an ER-positive, PR slightly positive, HER-2/neu not overexpressing tumor.  The patient completed whole breast radiation in February 2013.  [name] Screening for obesity   . Thyroid nodule     Past Surgical History:  Procedure Laterality Date  . ABDOMINAL HYSTERECTOMY  2011  . birth mark removed    . BREAST CYST ASPIRATION Right    negative 01/2010  . BREAST EXCISIONAL BIOPSY  August 05, 2010   left breast positive 07/2010  . BREAST LUMPECTOMY  2012   left breast  . COLONOSCOPY  July 29, 2012   normal study.  . THYROID SURGERY     partially removed  . TONSILLECTOMY    . TUBAL LIGATION      Family History  Problem Relation Age of Onset  . Breast cancer Other   . Colon cancer Other   . Ovarian cancer Other   . Cancer Mother     lung ca  . Cancer Maternal Uncle     lung ca  . Cancer Paternal Aunt     ovarian ca  . Cancer Cousin     female ca    Social History:  reports that she has never smoked. She has never used smokeless tobacco. She reports that she does not drink alcohol or use drugs.  She is from [place].  The patient is alone today.  Allergies:  Allergies  Allergen Reactions  .  Ace Inhibitors Swelling  . Penicillins Rash    Current Medications: Current Outpatient Prescriptions  Medication Sig Dispense Refill  . b complex vitamins tablet Take 1 tablet by mouth daily.    . Cholecalciferol (VITAMIN D) 2000 units CAPS Take 1 capsule (2,000 Units total) by mouth daily. 30 capsule   . exemestane (AROMASIN) 25 MG tablet Take 1 tablet (25 mg total) by mouth daily after breakfast. (Patient not taking: Reported on 02/19/2016) 30 tablet 4  . hydrochlorothiazide (HYDRODIURIL) 25 MG tablet Take 1 tablet (25 mg total) by mouth daily. 90 tablet 3  . Multiple Vitamins-Minerals (MULTIVITAMIN PO) Take by mouth daily. Reported on 05/15/2015    . Omega 3 1000 MG CAPS Take 1,200 mg by mouth 2 (two) times  daily.     [name] venlafaxine XR (EFFEXOR-XR) 150 MG 24 hr capsule Take 1 capsule (150 mg total) by mouth daily with breakfast. (Patient not taking: Reported on 02/19/2016) 30 capsule 3   No current facility-administered medications for this visit.     Review of Systems:  GENERAL:  Feels "good, I guess".  No fevers or sweats.  Weight up 2 pounds. PERFORMANCE STATUS (ECOG):  0 HEENT:  Tongue swelling.  No visual changes, runny nose, sore throat, mouth sores or tenderness. Lungs: No shortness of breath or cough.  No hemoptysis. Cardiac:  No chest pain, palpitations, orthopnea, or PND. GI:  No nausea, vomiting, diarrhea, constipation, melena or hematochezia. GU:  No urgency, frequency, dysuria, or hematuria. Musculoskeletal:  No back pain.  No joint pain.  No muscle tenderness. Extremities:  No pain or swelling. Skin:  No rashes or skin changes. Neuro:  No headache, numbness or weakness, balance or coordination issues. Endocrine:  s/p partial thyroidectomy.  No diabetes.  Hot flashes.  No night sweats. Psych:  No mood changes, depression or anxiety. Pain:  No focal pain. Review of systems:  All other systems reviewed and found to be negative.  Physical Exam: Last menstrual period 04/01/2008. GENERAL:  Well developed, well nourished, heavyset woman sitting comfortably in the exam room in no acute distress. MENTAL STATUS:  Alert and oriented to person, place and time. HEAD:  Short brown hair.  Normocephalic, atraumatic, face symmetric, no Cushingoid features. EYES:  Brown eyes.  Pupils equal round and reactive to light and accomodation.  No conjunctivitis or scleral icterus. ENT:  Oropharynx clear without lesion.  Mild macroglossia (thick tongue).  Mucous membranes moist.  RESPIRATORY:  Clear to auscultation without rales, wheezes or rhonchi. CARDIOVASCULAR:  Regular rate and rhythm without murmur, rub or gallop. BREAST:  Right breast without masses, skin changes or nipple discharge.  Left  breast s/p lumpectomy at the 9 o'clock position.  No discrete masses, skin changes or nipple discharge.  ABDOMEN:  Soft, non-tender, with active bowel sounds, and no appreciable hepatosplenomegaly.  No masses. SKIN:  No rashes, ulcers or lesions. EXTREMITIES: No edema, no skin discoloration or tenderness.  No palpable cords. LYMPH NODES: No palpable cervical, supraclavicular, axillary or inguinal adenopathy  NEUROLOGICAL: Unremarkable. PSYCH:  Appropriate.   No visits with results within 3 Day(s) from this visit.  Latest known visit with results is:  Orders Only on 02/10/2016  Component Date Value Ref Range Status  . Total Protein, Urine 02/09/2016 12.1  Not Estab. mg/dL Final  . Total Protein, Urine-Ur/day 02/09/2016 182* 30 - 150 mg/24 hr Final  . Albumin, U 02/09/2016 42.1  % Final  . ALPHA 1 URINE 02/09/2016 1.8  % Final  . Alpha  2, Urine 02/09/2016 14.0  % Final  . % BETA, Urine 02/09/2016 22.2  % Final  . GAMMA GLOBULIN URINE 02/09/2016 20.0  % Final  . M-SPIKE %, Urine 02/09/2016 Not Observed  Not Observed % Final  . Immunofixation Result, Urine 02/09/2016 Comment   Final  . Note: 02/09/2016 Comment   Final   Comment: (NOTE) Protein electrophoresis scan will follow via computer, mail, or courier delivery.   . Total Volume 02/09/2016 1500   Final  . PDF 02/09/2016 .   Corrected   Comment: (NOTE) Performed At: [facility] [address] [place], [place] [id number] Lindon Romp MD SQ:[id number]     Assessment:  HAE AHLERS is a 56 y.o. female with stage IIA left breast cancer s/p wide excision and axillary lymph node dissection on 09/03/2010.  Pathology revealed a 1.0 cm grade II invasive ductal carcinoma. One of 14 lymph nodes were positive macrometastasis of 1.3 cm.  Pathologic stage was T1cN1bM0.  Tumor was ER positive (90%), PR positive (20%), and HER-2/neu negative by FISH (Her-2/CEP17 ratio < 1.8).  She enrolled on NSABP B-47. She received AC 4  followed by weekly paclitaxel (completed on 02/06/2011).  She received breast radiation in 03/2011.  She completed a year of adjuvant Herceptin on 11/13/2011. She was on Arimidex from 07/2011 - 07/2015 and Femara from 09/04/2015 - 10/09/2015.  She stopped Arimidex and Femara secondary to vasomotor symptoms.  She began Aromasin on 10/09/2015  Breast cancer index (BCI) testing on 10/08/2015 revealed a high risk of late recurrence  Risk is estimated at 12.7% (CI: 7.4%-17.7%) during years 5-10.   She has a high likelihood of benefit from extended adjuvant hormonal therapy.  Bilateral mammogram on 12/10/2014 revealed lumpectomy changes in the left breast without evidence of malignancy.    CA27.29 was 19.5 on 09/04/2015.  CA27.29 has been followed: 19.5 on 12/11/2011, 21.1 on 03/14/2012, 20.7 on 09/19/2012, 23.5 on 04/13/2013, 17.4 on 11/02/2013, 21.0 on 12/20/2014, 19.5 on 09/04/2015, 21.7 on 10/09/2015, and 17.6 on 02/05/2016.  Bone density study on 12/04/2015 was normal with a T-score of -0.9 in the AP spine L1-L4 and -0.6 in the left femoral neck.  She has vasomotor symptoms well managed on Effexor.  Symptomatically, she has new macroglossia.  Aromasin and lisinopril were discontinued without change in symptoms.  Vasomotor symptoms are managed with Effexor.  Plan: 1.  Labs today:  CBC with diff, CMP, CA27.29. 2.  Bilateral diagnostic mammogram.   Lequita Asal, MD  04/14/2016, 6:53 PM

## 2016-04-15 ENCOUNTER — Inpatient Hospital Stay: Payer: 59 | Attending: Hematology and Oncology | Admitting: Hematology and Oncology

## 2016-06-08 HISTORY — PX: OTHER SURGICAL HISTORY: SHX169

## 2016-06-15 ENCOUNTER — Encounter: Payer: Self-pay | Admitting: Internal Medicine

## 2016-06-15 ENCOUNTER — Other Ambulatory Visit: Payer: Self-pay | Admitting: Internal Medicine

## 2016-06-15 ENCOUNTER — Telehealth: Payer: Self-pay

## 2016-06-15 ENCOUNTER — Ambulatory Visit (INDEPENDENT_AMBULATORY_CARE_PROVIDER_SITE_OTHER): Payer: 59 | Admitting: Internal Medicine

## 2016-06-15 VITALS — BP 112/68 | HR 82 | Ht 67.0 in | Wt 219.2 lb

## 2016-06-15 DIAGNOSIS — F410 Panic disorder [episodic paroxysmal anxiety] without agoraphobia: Secondary | ICD-10-CM | POA: Insufficient documentation

## 2016-06-15 DIAGNOSIS — Z9089 Acquired absence of other organs: Secondary | ICD-10-CM | POA: Insufficient documentation

## 2016-06-15 MED ORDER — LORAZEPAM 0.5 MG PO TABS: 0.5000 mg | ORAL_TABLET | Freq: Three times a day (TID) | ORAL | 0 refills | Status: DC | PRN

## 2016-06-15 NOTE — Telephone Encounter (Signed)
Spoke to pt and informed to come to appt and will discuss then.

## 2016-06-15 NOTE — Telephone Encounter (Signed)
Patient had tonsils removed and is having anxiety. She had anxiety in past and took Ativan short term. She called Dr.Dew who did the tonsils and they instructed her to call here. She is on schedule at 2:30 for 30 min incase you need to see her. She is aware we do NOT do narcotic anxiety meds especially without appt. Call [phone] and OK to talk to Husband.

## 2016-06-15 NOTE — Progress Notes (Signed)
Date:  06/15/2016   Name:  Heather Dudley   DOB:  1960-11-10   MRN:  [id number]   Chief Complaint: Anxiety (06/08/2016- tonsils removed. Takes couple weeks for swelling to go away. Feeling anxious because of swelling. This started day before surgery. Previosly took ativan for this- prescribed by Radiology doctor during cancer treatments. Had axiety attack day before surgery. ) Anxiety  Presents for initial visit. Onset was in the past 7 days (just before surgery 06/07/16). The problem has been waxing and waning. Symptoms include nervous/anxious behavior and palpitations. Patient reports no chest pain or shortness of breath. Symptoms occur most days. The most recent episode lasted 20 minutes. The severity of symptoms is moderate and causing significant distress.    Tonsillectomy - she had lingual tonsils removed one week ago.  Still having a lot of trouble swallowing and keeping liquids going.  She is taking liquid Oxycodone 5 mg without much pain relief. She feels like there is some waxing and waning of the swelling despite Advil.  She was also supposed to be seen in one week but does not have a follow up for another 10 days.    Review of Systems  Constitutional: Negative for fatigue and fever.  HENT: Positive for sore throat and trouble swallowing.        Minimal bleeding, no vomiting  Eyes: Negative for visual disturbance.  Respiratory: Negative for choking, shortness of breath and wheezing.   Cardiovascular: Positive for palpitations. Negative for chest pain.  Psychiatric/Behavioral: Positive for agitation. The patient is nervous/anxious.     Patient Active Problem List   Diagnosis Date Noted  . Macroglossia 02/05/2016  . Environmental and seasonal allergies 01/02/2016  . Hot flashes related to aromatase inhibitor therapy 09/08/2015  . Vitamin D deficiency 04/19/2015  . Cervical radiculopathy 04/17/2015  . Hypertension 04/17/2015  . History of partial thyroidectomy  04/17/2015  . Obesity, Class II, BMI 35-39.9 04/17/2015  . Lipoma of axilla 10/24/2014  . Breast cancer, left breast ([place]) 09/02/2010    Prior to Admission medications   Medication Sig Start Date End Date Taking? Authorizing Provider  hydrochlorothiazide (HYDRODIURIL) 25 MG tablet Take 1 tablet (25 mg total) by mouth daily. 01/02/16  Yes Glean Hess, MD    Allergies  Allergen Reactions  . Ace Inhibitors Swelling  . Penicillins Rash    Past Surgical History:  Procedure Laterality Date  . ABDOMINAL HYSTERECTOMY  2011  . birth mark removed    . BREAST CYST ASPIRATION Right    negative 01/2010  . BREAST EXCISIONAL BIOPSY  August 05, 2010   left breast positive 07/2010  . BREAST LUMPECTOMY  2012   left breast  . COLONOSCOPY  July 29, 2012   normal study.  . robotic tonsillectomy Lingual tonsils Bilateral 06/08/2016  . THYROID SURGERY     partially removed  . TONSILLECTOMY    . TUBAL LIGATION      Social History  Substance Use Topics  . Smoking status: Never Smoker  . Smokeless tobacco: Never Used  . Alcohol use No     Medication list has been reviewed and updated.   Physical Exam  Constitutional: She appears well-developed. She has a sickly appearance.  Cardiovascular: Normal rate, regular rhythm and normal heart sounds.   Pulmonary/Chest: Breath sounds normal. She has no decreased breath sounds. She has no wheezes.  Psychiatric: Her behavior is normal. Her mood appears anxious. Cognition and memory are normal.  Speech is soft due to  pain    BP 112/68 (BP Location: Right Arm, Patient Position: Sitting, Cuff Size: Large)   Pulse 82   Ht 5\' 7"  (1.702 m)   Wt 219 lb 3.2 oz (99.4 kg)   LMP 04/01/2008   SpO2 98%   BMI 34.33 kg/m   Assessment and Plan: 1. Anxiety attack May take lorazepam tid as needed for anxiety - avoid taking within 2 hours of narcotic pain medication  2. Status post tonsillectomy Continue pain medication Resume anti-inflammatory such as  tylenol Increase fluid intake Call Surgeon to be seen sooner than planned due to persistent swelling and pain   Meds ordered this encounter  Medications  . LORazepam (ATIVAN) 0.5 MG tablet    Sig: Take 1 tablet (0.5 mg total) by mouth every 8 (eight) hours as needed for anxiety.    Dispense:  20 tablet    Refill:  0    Halina Maidens, MD [facility]  06/15/2016

## 2016-07-01 ENCOUNTER — Ambulatory Visit (INDEPENDENT_AMBULATORY_CARE_PROVIDER_SITE_OTHER): Payer: 59 | Admitting: Internal Medicine

## 2016-07-01 ENCOUNTER — Encounter: Payer: Self-pay | Admitting: Internal Medicine

## 2016-07-01 ENCOUNTER — Other Ambulatory Visit: Payer: Self-pay | Admitting: Internal Medicine

## 2016-07-01 VITALS — BP 136/78 | HR 82 | Ht 67.0 in | Wt 219.0 lb

## 2016-07-01 DIAGNOSIS — Z170 Estrogen receptor positive status [ER+]: Secondary | ICD-10-CM

## 2016-07-01 DIAGNOSIS — Z1159 Encounter for screening for other viral diseases: Secondary | ICD-10-CM

## 2016-07-01 DIAGNOSIS — F410 Panic disorder [episodic paroxysmal anxiety] without agoraphobia: Secondary | ICD-10-CM | POA: Diagnosis not present

## 2016-07-01 DIAGNOSIS — Z0000 Encounter for general adult medical examination without abnormal findings: Secondary | ICD-10-CM | POA: Diagnosis not present

## 2016-07-01 DIAGNOSIS — C50912 Malignant neoplasm of unspecified site of left female breast: Secondary | ICD-10-CM

## 2016-07-01 DIAGNOSIS — E669 Obesity, unspecified: Secondary | ICD-10-CM | POA: Diagnosis not present

## 2016-07-01 DIAGNOSIS — Z853 Personal history of malignant neoplasm of breast: Secondary | ICD-10-CM

## 2016-07-01 DIAGNOSIS — I10 Essential (primary) hypertension: Secondary | ICD-10-CM | POA: Diagnosis not present

## 2016-07-01 DIAGNOSIS — E559 Vitamin D deficiency, unspecified: Secondary | ICD-10-CM | POA: Diagnosis not present

## 2016-07-01 LAB — POCT URINALYSIS DIPSTICK
Glucose, UA: NEGATIVE
Ketones, UA: NEGATIVE
LEUKOCYTES UA: NEGATIVE
NITRITE UA: NEGATIVE
PH UA: 6 (ref 5.0–8.0)
Spec Grav, UA: 1.025 (ref 1.010–1.025)
UROBILINOGEN UA: 0.2 U/dL

## 2016-07-01 NOTE — Progress Notes (Signed)
Date:  07/01/2016   Name:  Heather Dudley   DOB:  1961-01-25   MRN:  [id number]   Chief Complaint: Annual Exam (Breast exam. No pap- partial hysterectomy. Still has ovaries. ) Heather Dudley is a 56 y.o. female who presents today for her Complete Annual Exam. She feels well. She reports exercising none currently. She reports she is sleeping well. No further need for ativan - no more panic attacks.  Still has sore throat but improving.  Hypertension  This is a chronic problem. The problem is controlled. Pertinent negatives include no chest pain, headaches, palpitations or shortness of breath.   Breast cancer - lumpectomy in 2012 and completed XRT in 2013.  Overdue for follow up mammogram.     Review of Systems  Constitutional: Negative for chills, fatigue and fever.  HENT: Positive for sore throat (still recovering from tonsillectomy). Negative for congestion, hearing loss, tinnitus, trouble swallowing and voice change.   Eyes: Negative for visual disturbance.  Respiratory: Negative for cough, chest tightness, shortness of breath and wheezing.   Cardiovascular: Negative for chest pain, palpitations and leg swelling.  Gastrointestinal: Negative for abdominal pain, constipation, diarrhea and vomiting.  Endocrine: Negative for polydipsia and polyuria.  Genitourinary: Negative for dysuria, frequency, genital sores, vaginal bleeding and vaginal discharge.  Musculoskeletal: Negative for arthralgias, gait problem and joint swelling.  Skin: Negative for color change and rash.  Neurological: Negative for dizziness, tremors, light-headedness and headaches.  Hematological: Negative for adenopathy. Does not bruise/bleed easily.  Psychiatric/Behavioral: Negative for dysphoric mood and sleep disturbance. The patient is not nervous/anxious (has not had any more panic attacks).     Patient Active Problem List   Diagnosis Date Noted  . Anxiety attack 06/15/2016  . Macroglossia 02/05/2016   . Environmental and seasonal allergies 01/02/2016  . Hot flashes related to aromatase inhibitor therapy 09/08/2015  . Vitamin D deficiency 04/19/2015  . Cervical radiculopathy 04/17/2015  . Hypertension 04/17/2015  . History of partial thyroidectomy 04/17/2015  . Obesity, Class II, BMI 35-39.9 04/17/2015  . Lipoma of axilla 10/24/2014  . Breast cancer, left breast ([place]) 09/02/2010    Prior to Admission medications   Medication Sig Start Date End Date Taking? Authorizing Provider  hydrochlorothiazide (HYDRODIURIL) 25 MG tablet Take 1 tablet (25 mg total) by mouth daily. 01/02/16   Glean Hess, MD  LORazepam (ATIVAN) 0.5 MG tablet Take 1 tablet (0.5 mg total) by mouth every 8 (eight) hours as needed for anxiety. 06/15/16   Glean Hess, MD    Allergies  Allergen Reactions  . Ace Inhibitors Swelling  . Penicillins Rash    Past Surgical History:  Procedure Laterality Date  . ABDOMINAL HYSTERECTOMY  2011  . birth mark removed    . BREAST CYST ASPIRATION Right    negative 01/2010  . BREAST EXCISIONAL BIOPSY  August 05, 2010   left breast positive 07/2010  . BREAST LUMPECTOMY  2012   left breast  . COLONOSCOPY  July 29, 2012   normal study.  . robotic tonsillectomy Lingual tonsils Bilateral 06/08/2016  . THYROID SURGERY     partially removed  . TONSILLECTOMY    . TUBAL LIGATION      Social History  Substance Use Topics  . Smoking status: Never Smoker  . Smokeless tobacco: Never Used  . Alcohol use No     Medication list has been reviewed and updated.   Physical Exam  Constitutional: She is oriented to person, place, and time.  She appears well-developed and well-nourished. No distress.  HENT:  Head: Normocephalic and atraumatic.  Right Ear: Tympanic membrane and ear canal normal.  Left Ear: Tympanic membrane and ear canal normal.  Nose: Right sinus exhibits no maxillary sinus tenderness. Left sinus exhibits no maxillary sinus tenderness.  Mouth/Throat: Uvula  is midline and oropharynx is clear and moist.  Eyes: Conjunctivae and EOM are normal. Right eye exhibits no discharge. Left eye exhibits no discharge. No scleral icterus.  Neck: Normal range of motion. Carotid bruit is not present. No erythema present. No thyromegaly present.  Cardiovascular: Normal rate, regular rhythm, normal heart sounds and normal pulses.   Pulmonary/Chest: Effort normal. No respiratory distress. She has no wheezes. Right breast exhibits no mass, no nipple discharge, no skin change and no tenderness. Left breast exhibits mass (tissue firmness due to radiation) and skin change (surgical scar outer left breast). Left breast exhibits no nipple discharge and no tenderness.  Abdominal: Soft. Bowel sounds are normal. There is no hepatosplenomegaly. There is no tenderness. There is no CVA tenderness.  Musculoskeletal: Normal range of motion.  Lymphadenopathy:    She has no cervical adenopathy.    She has no axillary adenopathy.  Mild lymphedema of left arm  Neurological: She is alert and oriented to person, place, and time. She has normal reflexes. No cranial nerve deficit or sensory deficit.  Skin: Skin is warm, dry and intact. No rash noted.  Psychiatric: She has a normal mood and affect. Her speech is normal and behavior is normal. Thought content normal.  Nursing note and vitals reviewed.   BP 136/78 (BP Location: Right Arm, Patient Position: Sitting, Cuff Size: Large)   Pulse 82   Ht 5\' 7"  (1.702 m)   Wt 219 lb (99.3 kg)   LMP 04/01/2008 Comment: Hysterectomy was due to endometriosis. - still has ovaries.   SpO2 100%   BMI 34.30 kg/m   Assessment and Plan: 1. Annual physical exam - Lipid panel - POCT urinalysis dipstick  2. Essential hypertension controlled - CBC with Differential/Platelet - Comprehensive metabolic panel - TSH  3. Anxiety attack Not recurrent since recovering well from tonsillectomy  4. Malignant neoplasm of left breast in female, estrogen  receptor positive, unspecified site of breast ([place]) Overdue for mammogram - MM Digital Diagnostic Bilat; Future  5. Vitamin D deficiency Continue otc supplement - VITAMIN D 25 Hydroxy (Vit-D Deficiency, Fractures)  6. Obesity, Class II, BMI 35-39.9 Work on diet and weight loss  7. Need for hepatitis C screening test - Hepatitis C antibody   No orders of the defined types were placed in this encounter.   Halina Maidens, MD [facility]  07/01/2016

## 2016-07-01 NOTE — Patient Instructions (Signed)
DASH Eating Plan DASH stands for "Dietary Approaches to Stop Hypertension." The DASH eating plan is a healthy eating plan that has been shown to reduce high blood pressure (hypertension). It may also reduce your risk for type 2 diabetes, heart disease, and stroke. The DASH eating plan may also help with weight loss. What are tips for following this plan? General guidelines  Avoid eating more than 2,300 mg (milligrams) of salt (sodium) a day. If you have hypertension, you may need to reduce your sodium intake to 1,500 mg a day.  Limit alcohol intake to no more than 1 drink a day for nonpregnant women and 2 drinks a day for men. One drink equals 12 oz of beer, 5 oz of wine, or 1 oz of hard liquor.  Work with your health care provider to maintain a healthy body weight or to lose weight. Ask what an ideal weight is for you.  Get at least 30 minutes of exercise that causes your heart to beat faster (aerobic exercise) most days of the week. Activities may include walking, swimming, or biking.  Work with your health care provider or diet and nutrition specialist (dietitian) to adjust your eating plan to your individual calorie needs. Reading food labels  Check food labels for the amount of sodium per serving. Choose foods with less than 5 percent of the Daily Value of sodium. Generally, foods with less than 300 mg of sodium per serving fit into this eating plan.  To find whole grains, look for the word "whole" as the first word in the ingredient list. Shopping  Buy products labeled as "low-sodium" or "no salt added."  Buy fresh foods. Avoid canned foods and premade or frozen meals. Cooking  Avoid adding salt when cooking. Use salt-free seasonings or herbs instead of table salt or sea salt. Check with your health care provider or pharmacist before using salt substitutes.  Do not fry foods. Cook foods using healthy methods such as baking, boiling, grilling, and broiling instead.  Cook with  heart-healthy oils, such as olive, canola, soybean, or sunflower oil. Meal planning   Eat a balanced diet that includes: ? 5 or more servings of fruits and vegetables each day. At each meal, try to fill half of your plate with fruits and vegetables. ? Up to 6-8 servings of whole grains each day. ? Less than 6 oz of lean meat, poultry, or fish each day. A 3-oz serving of meat is about the same size as a deck of cards. One egg equals 1 oz. ? 2 servings of low-fat dairy each day. ? A serving of nuts, seeds, or beans 5 times each week. ? Heart-healthy fats. Healthy fats called Omega-3 fatty acids are found in foods such as flaxseeds and coldwater fish, like sardines, salmon, and mackerel.  Limit how much you eat of the following: ? Canned or prepackaged foods. ? Food that is high in trans fat, such as fried foods. ? Food that is high in saturated fat, such as fatty meat. ? Sweets, desserts, sugary drinks, and other foods with added sugar. ? Full-fat dairy products.  Do not salt foods before eating.  Try to eat at least 2 vegetarian meals each week.  Eat more home-cooked food and less restaurant, buffet, and fast food.  When eating at a restaurant, ask that your food be prepared with less salt or no salt, if possible. What foods are recommended? The items listed may not be a complete list. Talk with your dietitian about what   dietary choices are best for you. Grains Whole-grain or whole-wheat bread. Whole-grain or whole-wheat pasta. Brown rice. Oatmeal. Quinoa. Bulgur. Whole-grain and low-sodium cereals. Pita bread. Low-fat, low-sodium crackers. Whole-wheat flour tortillas. Vegetables Fresh or frozen vegetables (raw, steamed, roasted, or grilled). Low-sodium or reduced-sodium tomato and vegetable juice. Low-sodium or reduced-sodium tomato sauce and tomato paste. Low-sodium or reduced-sodium canned vegetables. Fruits All fresh, dried, or frozen fruit. Canned fruit in natural juice (without  added sugar). Meat and other protein foods Skinless chicken or turkey. Ground chicken or turkey. Pork with fat trimmed off. Fish and seafood. Egg whites. Dried beans, peas, or lentils. Unsalted nuts, nut butters, and seeds. Unsalted canned beans. Lean cuts of beef with fat trimmed off. Low-sodium, lean deli meat. Dairy Low-fat (1%) or fat-free (skim) milk. Fat-free, low-fat, or reduced-fat cheeses. Nonfat, low-sodium ricotta or cottage cheese. Low-fat or nonfat yogurt. Low-fat, low-sodium cheese. Fats and oils Soft margarine without trans fats. Vegetable oil. Low-fat, reduced-fat, or light mayonnaise and salad dressings (reduced-sodium). Canola, safflower, olive, soybean, and sunflower oils. Avocado. Seasoning and other foods Herbs. Spices. Seasoning mixes without salt. Unsalted popcorn and pretzels. Fat-free sweets. What foods are not recommended? The items listed may not be a complete list. Talk with your dietitian about what dietary choices are best for you. Grains Baked goods made with fat, such as croissants, muffins, or some breads. Dry pasta or rice meal packs. Vegetables Creamed or fried vegetables. Vegetables in a cheese sauce. Regular canned vegetables (not low-sodium or reduced-sodium). Regular canned tomato sauce and paste (not low-sodium or reduced-sodium). Regular tomato and vegetable juice (not low-sodium or reduced-sodium). Pickles. Olives. Fruits Canned fruit in a light or heavy syrup. Fried fruit. Fruit in cream or butter sauce. Meat and other protein foods Fatty cuts of meat. Ribs. Fried meat. Bacon. Sausage. Bologna and other processed lunch meats. Salami. Fatback. Hotdogs. Bratwurst. Salted nuts and seeds. Canned beans with added salt. Canned or smoked fish. Whole eggs or egg yolks. Chicken or turkey with skin. Dairy Whole or 2% milk, cream, and half-and-half. Whole or full-fat cream cheese. Whole-fat or sweetened yogurt. Full-fat cheese. Nondairy creamers. Whipped toppings.  Processed cheese and cheese spreads. Fats and oils Butter. Stick margarine. Lard. Shortening. Ghee. Bacon fat. Tropical oils, such as coconut, palm kernel, or palm oil. Seasoning and other foods Salted popcorn and pretzels. Onion salt, garlic salt, seasoned salt, table salt, and sea salt. Worcestershire sauce. Tartar sauce. Barbecue sauce. Teriyaki sauce. Soy sauce, including reduced-sodium. Steak sauce. Canned and packaged gravies. Fish sauce. Oyster sauce. Cocktail sauce. Horseradish that you find on the shelf. Ketchup. Mustard. Meat flavorings and tenderizers. Bouillon cubes. Hot sauce and Tabasco sauce. Premade or packaged marinades. Premade or packaged taco seasonings. Relishes. Regular salad dressings. Where to find more information:  National Heart, Lung, and Blood Institute: www.nhlbi.nih.gov  American Heart Association: www.heart.org Summary  The DASH eating plan is a healthy eating plan that has been shown to reduce high blood pressure (hypertension). It may also reduce your risk for type 2 diabetes, heart disease, and stroke.  With the DASH eating plan, you should limit salt (sodium) intake to 2,300 mg a day. If you have hypertension, you may need to reduce your sodium intake to 1,500 mg a day.  When on the DASH eating plan, aim to eat more fresh fruits and vegetables, whole grains, lean proteins, low-fat dairy, and heart-healthy fats.  Work with your health care provider or diet and nutrition specialist (dietitian) to adjust your eating plan to your individual   calorie needs. This information is not intended to replace advice given to you by your health care provider. Make sure you discuss any questions you have with your health care provider. Document Released: 01/29/2011 Document Revised: 02/03/2016 Document Reviewed: 02/03/2016 Elsevier Interactive Patient Education  2017 [employer].  

## 2016-07-02 ENCOUNTER — Encounter: Payer: Self-pay | Admitting: Internal Medicine

## 2016-07-02 LAB — VITAMIN D 25 HYDROXY (VIT D DEFICIENCY, FRACTURES): VIT D 25 HYDROXY: 20 ng/mL — AB (ref 30.0–100.0)

## 2016-07-02 LAB — COMPREHENSIVE METABOLIC PANEL
A/G RATIO: 1.7 (ref 1.2–2.2)
ALBUMIN: 4.3 g/dL (ref 3.5–5.5)
ALK PHOS: 73 IU/L (ref 39–117)
ALT: 16 IU/L (ref 0–32)
AST: 17 IU/L (ref 0–40)
BUN/Creatinine Ratio: 13 (ref 9–23)
BUN: 12 mg/dL (ref 6–24)
Bilirubin Total: <0.2 mg/dL (ref 0.0–1.2)
CO2: 30 mmol/L — ABNORMAL HIGH (ref 18–29)
CREATININE: 0.93 mg/dL (ref 0.57–1.00)
Calcium: 9.4 mg/dL (ref 8.7–10.2)
Chloride: 100 mmol/L (ref 96–106)
GFR calc Af Amer: 81 mL/min/{1.73_m2} (ref >59)
GFR calc non Af Amer: 70 mL/min/{1.73_m2} (ref >59)
GLOBULIN, TOTAL: 2.5 g/dL (ref 1.5–4.5)
GLUCOSE: 81 mg/dL (ref 65–99)
Potassium: 3.8 mmol/L (ref 3.5–5.2)
SODIUM: 144 mmol/L (ref 134–144)
Total Protein: 6.8 g/dL (ref 6.0–8.5)

## 2016-07-02 LAB — CBC WITH DIFFERENTIAL/PLATELET
BASOS ABS: 0 10*3/uL (ref 0.0–0.2)
BASOS: 1 %
EOS (ABSOLUTE): 0.1 10*3/uL (ref 0.0–0.4)
Eos: 2 %
HEMOGLOBIN: 12.6 g/dL (ref 11.1–15.9)
Hematocrit: 38.7 % (ref 34.0–46.6)
Immature Grans (Abs): 0 10*3/uL (ref 0.0–0.1)
Immature Granulocytes: 0 %
LYMPHS ABS: 2.4 10*3/uL (ref 0.7–3.1)
Lymphs: 43 %
MCH: 28.3 pg (ref 26.6–33.0)
MCHC: 32.6 g/dL (ref 31.5–35.7)
MCV: 87 fL (ref 79–97)
Monocytes Absolute: 0.5 10*3/uL (ref 0.1–0.9)
Monocytes: 9 %
NEUTROS ABS: 2.5 10*3/uL (ref 1.4–7.0)
Neutrophils: 45 %
Platelets: 277 10*3/uL (ref 150–379)
RBC: 4.45 x10E6/uL (ref 3.77–5.28)
RDW: 15.5 % — ABNORMAL HIGH (ref 12.3–15.4)
WBC: 5.4 10*3/uL (ref 3.4–10.8)

## 2016-07-02 LAB — LIPID PANEL
CHOLESTEROL TOTAL: 228 mg/dL — AB (ref 100–199)
Chol/HDL Ratio: 4.9 ratio — ABNORMAL HIGH (ref 0.0–4.4)
HDL: 47 mg/dL (ref >39)
LDL Calculated: 163 mg/dL — ABNORMAL HIGH (ref 0–99)
Triglycerides: 90 mg/dL (ref 0–149)
VLDL Cholesterol Cal: 18 mg/dL (ref 5–40)

## 2016-07-02 LAB — HEPATITIS C ANTIBODY

## 2016-07-02 LAB — TSH: TSH: 3 u[IU]/mL (ref 0.450–4.500)

## 2016-07-17 ENCOUNTER — Ambulatory Visit
Admission: RE | Admit: 2016-07-17 | Discharge: 2016-07-17 | Disposition: A | Discharge status: Home / Self Care | Payer: 59 | Source: Ambulatory Visit | Attending: Internal Medicine | Admitting: Internal Medicine

## 2016-07-17 DIAGNOSIS — Z853 Personal history of malignant neoplasm of breast: Secondary | ICD-10-CM | POA: Insufficient documentation

## 2016-10-09 DIAGNOSIS — G4733 Obstructive sleep apnea (adult) (pediatric): Secondary | ICD-10-CM | POA: Diagnosis not present

## 2016-11-17 DIAGNOSIS — G4733 Obstructive sleep apnea (adult) (pediatric): Secondary | ICD-10-CM | POA: Diagnosis not present

## 2016-11-17 DIAGNOSIS — G4761 Periodic limb movement disorder: Secondary | ICD-10-CM | POA: Diagnosis not present

## 2016-12-04 DIAGNOSIS — G4733 Obstructive sleep apnea (adult) (pediatric): Secondary | ICD-10-CM | POA: Diagnosis not present

## 2017-01-06 ENCOUNTER — Other Ambulatory Visit: Payer: Self-pay | Admitting: Internal Medicine

## 2017-01-06 DIAGNOSIS — I10 Essential (primary) hypertension: Secondary | ICD-10-CM

## 2017-01-25 DIAGNOSIS — G4733 Obstructive sleep apnea (adult) (pediatric): Secondary | ICD-10-CM | POA: Diagnosis not present

## 2017-03-21 ENCOUNTER — Encounter: Payer: Self-pay | Admitting: Emergency Medicine

## 2017-03-21 ENCOUNTER — Emergency Department: Payer: 59

## 2017-03-21 ENCOUNTER — Emergency Department
Admission: EM | Admit: 2017-03-21 | Discharge: 2017-03-21 | Disposition: A | Discharge status: Home / Self Care | Payer: 59 | Attending: Emergency Medicine | Admitting: Emergency Medicine

## 2017-03-21 ENCOUNTER — Other Ambulatory Visit: Payer: Self-pay

## 2017-03-21 DIAGNOSIS — I10 Essential (primary) hypertension: Secondary | ICD-10-CM | POA: Diagnosis not present

## 2017-03-21 DIAGNOSIS — R0602 Shortness of breath: Secondary | ICD-10-CM | POA: Diagnosis not present

## 2017-03-21 DIAGNOSIS — Z85828 Personal history of other malignant neoplasm of skin: Secondary | ICD-10-CM | POA: Diagnosis not present

## 2017-03-21 DIAGNOSIS — R079 Chest pain, unspecified: Secondary | ICD-10-CM | POA: Diagnosis not present

## 2017-03-21 DIAGNOSIS — Z79899 Other long term (current) drug therapy: Secondary | ICD-10-CM | POA: Insufficient documentation

## 2017-03-21 DIAGNOSIS — R0789 Other chest pain: Secondary | ICD-10-CM | POA: Diagnosis not present

## 2017-03-21 DIAGNOSIS — Z853 Personal history of malignant neoplasm of breast: Secondary | ICD-10-CM | POA: Diagnosis not present

## 2017-03-21 LAB — COMPREHENSIVE METABOLIC PANEL
ALT: 12 U/L — AB (ref 14–54)
ANION GAP: 11 (ref 5–15)
AST: 22 U/L (ref 15–41)
Albumin: 4 g/dL (ref 3.5–5.0)
Alkaline Phosphatase: 74 U/L (ref 38–126)
BUN: 15 mg/dL (ref 6–20)
CHLORIDE: 102 mmol/L (ref 101–111)
CO2: 25 mmol/L (ref 22–32)
Calcium: 9.4 mg/dL (ref 8.9–10.3)
Creatinine, Ser: 1.13 mg/dL — ABNORMAL HIGH (ref 0.44–1.00)
GFR calc Af Amer: >60 mL/min (ref >60)
GFR calc non Af Amer: 54 mL/min — ABNORMAL LOW (ref >60)
Glucose, Bld: 113 mg/dL — ABNORMAL HIGH (ref 65–99)
POTASSIUM: 3.3 mmol/L — AB (ref 3.5–5.1)
SODIUM: 138 mmol/L (ref 135–145)
Total Bilirubin: 0.5 mg/dL (ref 0.3–1.2)
Total Protein: 7.7 g/dL (ref 6.5–8.1)

## 2017-03-21 LAB — CBC WITH DIFFERENTIAL/PLATELET
Basophils Absolute: 0.1 10*3/uL (ref 0–0.1)
Basophils Relative: 1 %
EOS ABS: 0.2 10*3/uL (ref 0–0.7)
EOS PCT: 2 %
HCT: 39.9 % (ref 35.0–47.0)
Hemoglobin: 13.3 g/dL (ref 12.0–16.0)
LYMPHS ABS: 2.9 10*3/uL (ref 1.0–3.6)
Lymphocytes Relative: 29 %
MCH: 28.3 pg (ref 26.0–34.0)
MCHC: 33.2 g/dL (ref 32.0–36.0)
MCV: 85.2 fL (ref 80.0–100.0)
Monocytes Absolute: 0.7 10*3/uL (ref 0.2–0.9)
Monocytes Relative: 7 %
Neutro Abs: 6.2 10*3/uL (ref 1.4–6.5)
Neutrophils Relative %: 61 %
PLATELETS: 286 10*3/uL (ref 150–440)
RBC: 4.68 MIL/uL (ref 3.80–5.20)
RDW: 14 % (ref 11.5–14.5)
WBC: 10.1 10*3/uL (ref 3.6–11.0)

## 2017-03-21 LAB — TROPONIN I
Troponin I: <0.03 ng/mL (ref <0.03)
Troponin I: <0.03 ng/mL (ref <0.03)

## 2017-03-21 MED ORDER — IOPAMIDOL (ISOVUE-300) INJECTION 61%: 30.0000 mL | Freq: Once | INTRAVENOUS | Status: DC | PRN

## 2017-03-21 MED ORDER — IOPAMIDOL (ISOVUE-370) INJECTION 76%
75.0000 mL | Freq: Once | INTRAVENOUS | Status: AC | PRN
  Administered 2017-03-21: 75 mL via INTRAVENOUS

## 2017-03-21 MED ORDER — SODIUM CHLORIDE 0.9 % IV BOLUS (SEPSIS)
1000.0000 mL | Freq: Once | INTRAVENOUS | Status: AC
  Administered 2017-03-21: 1000 mL via INTRAVENOUS

## 2017-03-21 NOTE — ED Triage Notes (Addendum)
Patient wheelchair to triage with complaints of chest tightness since 2am.  constant dull "pricking" in center chest.  Pt reports SOB and some dizziness.  Pt denies N/V/D/HA/chills/sweating/fever or radiation of pain. Pt reports taking 2 x 81mg  ASA at 230 before coming to ED.  Speaking in complete coherent sentences. No acute breathing distress noted.  Denies cardiac hx treating HTN only  Pt finished chemo for breast CA in 2015  Left arm restriction d/t hx of breast CA

## 2017-03-21 NOTE — ED Notes (Signed)
Patient verbalized understanding of discharge instructions and follow-up care. Ambulatory to lobby with steady gait and NAD noted. 

## 2017-03-21 NOTE — ED Notes (Signed)
Patient transported to X-ray 

## 2017-03-21 NOTE — ED Notes (Signed)
ED Provider at bedside. 

## 2017-03-21 NOTE — ED Notes (Signed)
Patient transported to CT 

## 2017-03-21 NOTE — ED Provider Notes (Signed)
[facility] Emergency Department Provider Note  ____________________________________________   First MD Initiated Contact with Patient 03/21/17 [phone]     (approximate)  I have reviewed the triage vital signs and the nursing notes.   HISTORY  Chief Complaint Chest Pain   HPI Heather Dudley is a 57 y.o. female who self presents to the emergency department with left-sided chest pain that awoke her from sleep about an hour prior to arrival.  The pain is in her left chest and is described as "pricking".  It seems to radiate somewhat to her left shoulder although she is not sure because she has chronic left arm pain secondary to lumpectomy and lymph node dissection.  The pain is constant.  Nonexertional.  Associated with some shortness of breath.  No nausea or diaphoresis.  No recent travel surgery or immobilization.  No history of DVT or pulmonary embolism.  No leg swelling.  She does have a history of hypertension and a family history of coronary artery disease.  The pain is slowly subsiding on its own right non-positional.  Nothing in particular seems to make it better or worse.  It came on suddenly.  Past Medical History:  Diagnosis Date  . Basal cell carcinoma of forehead    birthmark of forehead  . Breast screening, unspecified   . Cellulitis and abscess of trunk   . Hernia   . History of chemotherapy 2012  . Hypertension   . Lump or mass in breast   . Malignant neoplasm of breast (female), unspecified site    She underwent wide excision, mastoplasty and axillary dissection for her left breast malignancy on September 03, 2010. The primary tumor was 1 cm in diameter, and a single positive axillary node 1.3 cm in diameter. This was an ER-positive, PR slightly positive, HER-2/neu not overexpressing tumor. She tolerated her whole breast radiation without difficulty ending in late February 2013.  . Malignant neoplasm of upper-outer quadrant of female breast ([place])     . 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 Obesity, unspecified   . Personal history of malignant neoplasm of breast    The patient underwent wide excision, mastoplasty. Dissection for a T1b, N1, M0 carcinoma left breast on September 03, 2010.The primary tumor was 1 cm in diameter, and a single positive axillary node 1.3 cm in diameter. This was an ER-positive, PR slightly positive, HER-2/neu not overexpressing tumor.  The patient completed whole breast radiation in February 2013.  [name] Screening for obesity   . Thyroid nodule     Patient Active Problem List   Diagnosis Date Noted  . Hyperlipidemia, mixed 07/02/2016  . Anxiety attack 06/15/2016  . Macroglossia 02/05/2016  . Environmental and seasonal allergies 01/02/2016  . Hot flashes related to aromatase inhibitor therapy 09/08/2015  . Vitamin D deficiency 04/19/2015  . Cervical radiculopathy 04/17/2015  . Hypertension 04/17/2015  . History of partial thyroidectomy 04/17/2015  . Obesity, Class II, BMI 35-39.9 04/17/2015  . Lipoma of axilla 10/24/2014  . Breast cancer, left breast ([place]) 09/02/2010    Past Surgical History:  Procedure Laterality Date  . ABDOMINAL HYSTERECTOMY  2011  . birth mark removed    . BREAST CYST ASPIRATION Right    negative 01/2010  . BREAST EXCISIONAL BIOPSY  August 05, 2010   left breast positive 07/2010  . BREAST LUMPECTOMY  2012   left breast  . COLONOSCOPY  July 29, 2012   normal study.  [name]  robotic tonsillectomy Lingual tonsils Bilateral 06/08/2016  . THYROID SURGERY     partially removed  . TONSILLECTOMY    . TUBAL LIGATION      Prior to Admission medications   Medication Sig Start Date End Date Taking? Authorizing Provider  Ergocalciferol (VITAMIN D2) 400 units TABS Take 400 Units by mouth daily.    Yes [provider]  hydrochlorothiazide (HYDRODIURIL) 25 MG tablet TAKE 1 TABLET (25 MG TOTAL) BY MOUTH DAILY. 01/06/17  Yes Glean Hess, MD  LORazepam  (ATIVAN) 0.5 MG tablet Take 1 tablet (0.5 mg total) by mouth every 8 (eight) hours as needed for anxiety. 06/15/16  Yes Glean Hess, MD    Allergies Ace inhibitors and Penicillins  Family History  Problem Relation Age of Onset  . Colon cancer Other   . Ovarian cancer Other   . Cancer Mother        lung ca  . Cancer Maternal Uncle        lung ca  . Cancer Paternal Aunt        ovarian ca  . Cancer Cousin        female ca    Social History Social History   Tobacco Use  . Smoking status: Never Smoker  . Smokeless tobacco: Never Used  Substance Use Topics  . Alcohol use: No    Alcohol/week: 0.0 oz  . Drug use: No    Review of Systems Constitutional: No fever/chills Eyes: No visual changes. ENT: No sore throat. Cardiovascular: Positive for chest pain. Respiratory: Positive for shortness of breath. Gastrointestinal: No abdominal pain.  No nausea, no vomiting.  No diarrhea.  No constipation. Genitourinary: Negative for dysuria. Musculoskeletal: Negative for back pain. Skin: Negative for rash. Neurological: Negative for headaches, focal weakness or numbness.   ____________________________________________   PHYSICAL EXAM:  VITAL SIGNS: ED Triage Vitals  Enc Vitals Group     BP 03/21/17 0437 (!) 152/92     Pulse Rate 03/21/17 0437 80     Resp 03/21/17 0437 18     Temp 03/21/17 0437 97.9 F (36.6 C)     Temp Source 03/21/17 0437 Oral     SpO2 03/21/17 0437 100 %     Weight 03/21/17 0439 235 lb (106.6 kg)     Height 03/21/17 0439 _0  (1.702 m)     Head Circumference --      Peak Flow --      Pain Score 03/21/17 0439 3     Pain Loc --      Pain Edu? --      Excl. in [place]? --     Constitutional: Alert and oriented x4 pleasant cooperative speaks in full clear sentences no diaphoresis Eyes: PERRL EOMI. Head: Atraumatic. Nose: No congestion/rhinnorhea. Mouth/Throat: No trismus Neck: No stridor.  Able to lie completely flat no JVD Cardiovascular: Normal  rate, regular rhythm. Grossly normal heart sounds.  Good peripheral circulation. Respiratory: Normal respiratory effort.  No retractions. Lungs CTAB and moving good air Gastrointestinal: Soft nontender Musculoskeletal: No lower extremity edema legs are equal in size Neurologic:  Normal speech and language. No gross focal neurologic deficits are appreciated. Skin:  Skin is warm, dry and intact. No rash noted. Psychiatric: Mood and affect are normal. Speech and behavior are normal.    ____________________________________________   DIFFERENTIAL includes but not limited to  Acute coronary syndrome, pulmonary embolism, aortic dissection, pneumonia, gastritis, Boerhaave syndrome ____________________________________________   LABS (all labs ordered are listed, but only  abnormal results are displayed)  Labs Reviewed  COMPREHENSIVE METABOLIC PANEL - Abnormal; Notable for the following components:      Result Value   Potassium 3.3 (*)    Glucose, Bld 113 (*)    Creatinine, Ser 1.13 (*)    ALT 12 (*)    GFR calc non Af Amer 54 (*)    All other components within normal limits  TROPONIN I  CBC WITH DIFFERENTIAL/PLATELET  TROPONIN I    Lab work reviewed by me with first troponin negative __________________________________________  EKG  ED ECG REPORT I, Darel Hong, the attending physician, personally viewed and interpreted this ECG.  Date: 03/21/2017 EKG Time:  Rate: 81 Rhythm: normal sinus rhythm QRS Axis: Leftward axis Intervals: normal ST/T Wave abnormalities: normal Narrative Interpretation: no evidence of acute ischemia.  Poor R wave progression  ____________________________________________  RADIOLOGY  Chest x-ray reviewed by me with no acute disease CT scan of the chest reviewed by me with no evidence of pulmonary embolism ____________________________________________   PROCEDURES  Procedure(s) performed: no  Procedures  Critical Care performed:  no  Observation: no ____________________________________________   INITIAL IMPRESSION / ASSESSMENT AND PLAN / ED COURSE  Pertinent labs & imaging results that were available during my care of the patient were reviewed by me and considered in my medical decision making (see chart for details).  While the patient's EKG is nonischemic and her first troponin is negative she does have multiple cardiac risk factors including a strong family history which is concerning.  She requires at least a second troponin.  She also has a history of malignancy and this chest pain is associated with shortness of breath which raises concern for pulmonary embolism.  CT angiogram of the chest is pending.     ----------------------------------------- 6:45 AM on 03/21/2017 -----------------------------------------  Fortunately the patient's CT angiogram is negative for significant pulmonary embolism.  She still does require a second troponin.  If this is negative she will be able to be discharged with cardiology follow-up.  Her heart score is 3. ____________________________________________   FINAL CLINICAL IMPRESSION(S) / ED DIAGNOSES  Final diagnoses:  Chest pain, unspecified type  Nonspecific chest pain      NEW MEDICATIONS STARTED DURING THIS VISIT:  New Prescriptions   No medications on file     Note:  This document was prepared using Dragon voice recognition software and may include unintentional dictation errors.     Darel Hong, MD 03/21/17 [phone]

## 2017-03-21 NOTE — ED Notes (Signed)
This RN in to discharge patient. Patient requesting to finish fluid bolus before discharge. MD aware and ok with plan.

## 2017-03-21 NOTE — ED Notes (Signed)
Returned to room.

## 2017-03-21 NOTE — ED Notes (Signed)
Patient reports woke around 2 am noticed pain/tighness mid chest that has since become a "sticking" sensation.  Reports pain to left arm, but also has chronic pain to this arm s/p lymph node removal.  Patient reports feeling short of breath.  Denies nausea or vomiting.

## 2017-03-21 NOTE — Discharge Instructions (Addendum)
The CAT scan did not show any thing going on in your lungs but there was a spot in the liver that needs to be reevaluated. I could not do the CAT scan of the abdomen today because of the contrast load you got here in the emergency room for the chest CT. You will need a contrast enhanced CT of the abdomen and pelvis using IV contrast to check on the 2.3 cm liver lesion that was seen on the chest CT. Please ask your primary care doctor or possibly Dr. Candis Musa the cardiologist to schedule this for you and provide follow-up. It would be best if she called her primary care doctor. Please do not delay doing this. while it is most likely that there is nothing bad going on and it is still possible that there could be an early cancer there. We need to find out as soon as possible. Fortunately today your blood work and your EKG were all reassuring.  Please do make sure you make an appointment to follow-up with cardiology within the next 2 days for reevaluation.  Return to the emergency department sooner for any new or worsening symptoms such as worsening pain, shortness of breath, or for any other issues whatsoever.  It was a pleasure to take care of you today, and thank you for coming to our emergency department.  If you have any questions or concerns before leaving please ask the nurse to grab me and I'm more than happy to go through your aftercare instructions again.  If you were prescribed any opioid pain medication today such as Norco, Vicodin, Percocet, morphine, hydrocodone, or oxycodone please make sure you do not drive when you are taking this medication as it can alter your ability to drive safely.  If you have any concerns once you are home that you are not improving or are in fact getting worse before you can make it to your follow-up appointment, please do not hesitate to call [phone] and come back for further evaluation.  Darel Hong, MD  Results for orders placed or performed during the hospital  encounter of 03/21/17  Troponin I  Result Value Ref Range   Troponin I <0.03 <0.03 ng/mL  CBC with Differential/Platelet  Result Value Ref Range   WBC 10.1 3.6 - 11.0 K/uL   RBC 4.68 3.80 - 5.20 MIL/uL   Hemoglobin 13.3 12.0 - 16.0 g/dL   HCT 39.9 35.0 - 47.0 %   MCV 85.2 80.0 - 100.0 fL   MCH 28.3 26.0 - 34.0 pg   MCHC 33.2 32.0 - 36.0 g/dL   RDW 14.0 11.5 - 14.5 %   Platelets 286 150 - 440 K/uL   Neutrophils Relative % 61 %   Neutro Abs 6.2 1.4 - 6.5 K/uL   Lymphocytes Relative 29 %   Lymphs Abs 2.9 1.0 - 3.6 K/uL   Monocytes Relative 7 %   Monocytes Absolute 0.7 0.2 - 0.9 K/uL   Eosinophils Relative 2 %   Eosinophils Absolute 0.2 0 - 0.7 K/uL   Basophils Relative 1 %   Basophils Absolute 0.1 0 - 0.1 K/uL  Comprehensive metabolic panel  Result Value Ref Range   Sodium 138 135 - 145 mmol/L   Potassium 3.3 (L) 3.5 - 5.1 mmol/L   Chloride 102 101 - 111 mmol/L   CO2 25 22 - 32 mmol/L   Glucose, Bld 113 (H) 65 - 99 mg/dL   BUN 15 6 - 20 mg/dL   Creatinine, Ser 1.13 (H)  0.44 - 1.00 mg/dL   Calcium 9.4 8.9 - 10.3 mg/dL   Total Protein 7.7 6.5 - 8.1 g/dL   Albumin 4.0 3.5 - 5.0 g/dL   AST 22 15 - 41 U/L   ALT 12 (L) 14 - 54 U/L   Alkaline Phosphatase 74 38 - 126 U/L   Total Bilirubin 0.5 0.3 - 1.2 mg/dL   GFR calc non Af Amer 54 (L) >60 mL/min   GFR calc Af Amer >60 >60 mL/min   Anion gap 11 5 - 15   Dg Chest 2 View  Result Date: 03/21/2017 CLINICAL DATA:  Chest tightness since 2 a.m. Shortness of breath and dizziness. EXAM: CHEST  2 VIEW COMPARISON:  04/05/2011 FINDINGS: The heart size and mediastinal contours are within normal limits. Both lungs are clear. The visualized skeletal structures are unremarkable. IMPRESSION: No active cardiopulmonary disease. Electronically Signed   By: Lucienne Capers M.D.   On: 03/21/2017 05:22   Ct Angio Chest Pe W/cm &/or Wo Cm  Result Date: 03/21/2017 CLINICAL DATA:  Pain and tightness in the mid chest. Pain to the left arm. Shortness  of breath. History of breast cancer. EXAM: CT ANGIOGRAPHY CHEST WITH CONTRAST TECHNIQUE: Multidetector CT imaging of the chest was performed using the standard protocol during bolus administration of intravenous contrast. Multiplanar CT image reconstructions and MIPs were obtained to evaluate the vascular anatomy. CONTRAST:  73mL ISOVUE-370 IOPAMIDOL (ISOVUE-370) INJECTION 76% COMPARISON:  None. FINDINGS: Cardiovascular: Good opacification of the central and segmental pulmonary arteries. No focal filling defects. No evidence of significant pulmonary embolus. Normal heart size. No pericardial effusion. Normal caliber thoracic aorta. Mediastinum/Nodes: Esophagus is decompressed. No significant lymphadenopathy. Lungs/Pleura: Mild dependent atelectasis in the lung bases. Lungs are otherwise clear and expanded. No pleural effusions. No pneumothorax. Airways are patent. Upper Abdomen: Low-attenuation lesion in the medial segment left lobe of the liver consistent with a cyst. Additional attenuation lesion measuring 2.3 cm in diameter seen in the right lobe of the liver only on the lowest image. Appearance suggests a solid lesion although it could be partial volume with a cystic lesion below. Suggest follow-up with contrast-enhanced CT abdomen and pelvis to exclude significant liver lesion. Musculoskeletal: No destructive bone lesions. Review of the MIP images confirms the above findings. IMPRESSION: 1. No evidence of significant pulmonary embolus. 2. No evidence of active pulmonary disease. 3. Indeterminate lesion in the right lobe of the liver only seen on the lowest image. Possible solid lesion. Suggest follow-up with contrast-enhanced CT abdomen and pelvis to exclude significant liver lesion. Electronically Signed   By: Lucienne Capers M.D.   On: 03/21/2017 06:30

## 2017-03-23 ENCOUNTER — Telehealth: Payer: Self-pay

## 2017-03-23 NOTE — Telephone Encounter (Signed)
Pt was seen in ED on 03/21/17 for CP  lmov for patient to call back and schedule a follow up  Will need try again at a later time

## 2017-03-24 ENCOUNTER — Telehealth: Payer: Self-pay | Admitting: *Deleted

## 2017-03-24 NOTE — Telephone Encounter (Signed)
Left message for pt to call me back 

## 2017-03-24 NOTE — Telephone Encounter (Signed)
-----   Message from Secundino Ginger sent at 03/22/2017  3:17 PM EST ----- Regarding: call patient Contact: [phone] Twanna said she was a patient of Dr Janese Banks. She said something has come up and she needs to talk to you. She would not tell me what has come up.   Her cell phone number is [phone] , you can call either number.

## 2017-03-25 NOTE — Telephone Encounter (Signed)
Pt was seen in ED on 03/21/17 for CP  lmov for patient to call back and schedule a follow up  Will need try again at a later time

## 2017-03-25 NOTE — Telephone Encounter (Signed)
Pt scheduled 05/04/17   Nothing else needed.

## 2017-03-26 ENCOUNTER — Encounter: Payer: Self-pay | Admitting: Internal Medicine

## 2017-03-26 ENCOUNTER — Telehealth: Payer: Self-pay | Admitting: *Deleted

## 2017-03-26 ENCOUNTER — Ambulatory Visit (INDEPENDENT_AMBULATORY_CARE_PROVIDER_SITE_OTHER): Payer: 59 | Admitting: Internal Medicine

## 2017-03-26 ENCOUNTER — Telehealth: Payer: Self-pay

## 2017-03-26 VITALS — BP 132/80 | HR 92 | Ht 67.0 in | Wt 226.0 lb

## 2017-03-26 DIAGNOSIS — R160 Hepatomegaly, not elsewhere classified: Secondary | ICD-10-CM

## 2017-03-26 DIAGNOSIS — D1803 Hemangioma of intra-abdominal structures: Secondary | ICD-10-CM | POA: Insufficient documentation

## 2017-03-26 NOTE — Telephone Encounter (Signed)
Pt had called earlier in the week and had to speak to me but no reason left. This is my second call this week to try to get her on the phone. I left a message with my telephone number to call me back

## 2017-03-26 NOTE — Telephone Encounter (Signed)
-----   Message from Secundino Ginger sent at 03/22/2017  3:17 PM EST ----- Regarding: call patient Contact: [phone] Shaili said she was a patient of Dr Janese Banks. She said something has come up and she needs to talk to you. She would not tell me what has come up.   Her cell phone number is [phone] , you can call either number.

## 2017-03-26 NOTE — Telephone Encounter (Signed)
Called and did PA on patient CT. Was approved to be done between 03/26/2017-05/10/2017. Approval code: [id number]

## 2017-03-26 NOTE — Progress Notes (Signed)
Date:  03/26/2017   Name:  Heather Dudley   DOB:  06-28-1960   MRN:  [id number]   Chief Complaint: Mass (noted in liver on recent CT chest) and Anxiety (PHQ9- 16) 03/21/17 CT Angio: Upper Abdomen: Low-attenuation lesion in the medial segment left lobe of the liver consistent with a cyst. Additional attenuation lesion measuring 2.3 cm in diameter seen in the right lobe of the liver only on the lowest image. Appearance suggests a solid lesion although it could be partial volume with a cystic lesion below. Suggest follow-up with contrast-enhanced CT abdomen and pelvis to exclude significant liver lesion.   Review of Systems  Constitutional: Negative for chills, fatigue and fever.  Respiratory: Negative for chest tightness and shortness of breath.   Gastrointestinal: Negative for abdominal pain, blood in stool and constipation.  Genitourinary: Negative for dysuria.  Skin: Negative for color change.  Neurological: Negative for dizziness and headaches.    Patient Active Problem List   Diagnosis Date Noted  . Liver lesion 03/26/2017  . Hyperlipidemia, mixed 07/02/2016  . Anxiety attack 06/15/2016  . Macroglossia 02/05/2016  . Environmental and seasonal allergies 01/02/2016  . Hot flashes related to aromatase inhibitor therapy 09/08/2015  . Vitamin D deficiency 04/19/2015  . Cervical radiculopathy 04/17/2015  . Hypertension 04/17/2015  . History of partial thyroidectomy 04/17/2015  . Obesity, Class II, BMI 35-39.9 04/17/2015  . Lipoma of axilla 10/24/2014  . History of breast cancer in female 09/02/2010    Prior to Admission medications   Medication Sig Start Date End Date Taking? Authorizing Provider  Ergocalciferol (VITAMIN D2) 400 units TABS Take 400 Units by mouth daily.     [provider]  hydrochlorothiazide (HYDRODIURIL) 25 MG tablet TAKE 1 TABLET (25 MG TOTAL) BY MOUTH DAILY. 01/06/17   Glean Hess, MD  LORazepam (ATIVAN) 0.5 MG tablet Take 1 tablet  (0.5 mg total) by mouth every 8 (eight) hours as needed for anxiety. 06/15/16   Glean Hess, MD    Allergies  Allergen Reactions  . Ace Inhibitors Swelling  . Penicillins Rash    Has patient had a PCN reaction causing immediate rash, facial/tongue/throat swelling, SOB or lightheadedness with hypotension: Yes Has patient had a PCN reaction causing severe rash involving mucus membranes or skin necrosis: No Has patient had a PCN reaction that required hospitalization: No Has patient had a PCN reaction occurring within the last 10 years: No If all of the above answers are "NO", then may proceed with Cephalosporin use.    Past Surgical History:  Procedure Laterality Date  . ABDOMINAL HYSTERECTOMY  2011  . birth mark removed    . BREAST CYST ASPIRATION Right    negative 01/2010  . BREAST EXCISIONAL BIOPSY  August 05, 2010   left breast positive 07/2010  . BREAST LUMPECTOMY  2012   left breast  . COLONOSCOPY  July 29, 2012   normal study.  . robotic tonsillectomy Lingual tonsils Bilateral 06/08/2016  . THYROID SURGERY     partially removed  . TONSILLECTOMY    . TUBAL LIGATION      Social History   Tobacco Use  . Smoking status: Never Smoker  . Smokeless tobacco: Never Used  Substance Use Topics  . Alcohol use: No    Alcohol/week: 0.0 oz  . Drug use: No     Medication list has been reviewed and updated.  PHQ 2/9 Scores 03/26/2017 08/21/2015 04/17/2015  PHQ - 2 Score 0 0 0  Physical Exam  Constitutional: She is oriented to person, place, and time. She appears well-developed. No distress.  HENT:  Head: Normocephalic and atraumatic.  Neck: Normal range of motion. Neck supple. No thyromegaly present.  Cardiovascular: Normal rate, regular rhythm and normal heart sounds.  Pulmonary/Chest: Effort normal and breath sounds normal. No respiratory distress. She has no wheezes.  Musculoskeletal: Normal range of motion. She exhibits no edema.  Neurological: She is alert and  oriented to person, place, and time.  Skin: Skin is warm and dry. No rash noted.  Psychiatric: She has a normal mood and affect. Her behavior is normal. Thought content normal.  Nursing note and vitals reviewed.   BP 132/80   Pulse 92   Ht 5\' 7"  (1.702 m)   Wt 226 lb (102.5 kg)   LMP 04/01/2008 Comment: Hysterectomy was due to endometriosis. - still has ovaries.   SpO2 100%   BMI 35.40 kg/m   Assessment and Plan: 1. Liver masses - CT Abdomen Pelvis W Contrast; Future   No orders of the defined types were placed in this encounter.   Partially dictated using Editor, commissioning. Any errors are unintentional.  Halina Maidens, MD [facility]  03/26/2017

## 2017-04-02 ENCOUNTER — Telehealth: Payer: Self-pay | Admitting: *Deleted

## 2017-04-02 ENCOUNTER — Telehealth: Payer: Self-pay | Admitting: Internal Medicine

## 2017-04-02 NOTE — Telephone Encounter (Signed)
Need corrective order??

## 2017-04-02 NOTE — Telephone Encounter (Signed)
Pt called me and said that she went to ER with chest pain. She had scan to rule out pe and it was eg. But showed 2 spots in liver that could be cyst but rec: that she get ct scan.  She was also told that she had the same thing 2013. She wondered why dr Ma Hillock did not tell her about it.  I reviewed all notes from [facility] and looked at results in 2013. DMa Hillock did not order the scan, it was done in ER so I assume he did not know that it was done. She remembers it now.  I told her that I did speak to Janese Banks and she would like to wait and see what scan shows on Monday and go from there about what it shows and what should be next step. She is agreeable to plan

## 2017-04-02 NOTE — Telephone Encounter (Signed)
I did this yesterday and already did the prior auth which was approved. See Lexine Baton if need info.

## 2017-04-05 ENCOUNTER — Ambulatory Visit
Admission: RE | Admit: 2017-04-05 | Discharge: 2017-04-05 | Disposition: A | Discharge status: Home / Self Care | Payer: 59 | Source: Ambulatory Visit | Attending: Internal Medicine | Admitting: Internal Medicine

## 2017-04-05 DIAGNOSIS — R160 Hepatomegaly, not elsewhere classified: Secondary | ICD-10-CM

## 2017-04-05 DIAGNOSIS — K7689 Other specified diseases of liver: Secondary | ICD-10-CM | POA: Insufficient documentation

## 2017-04-05 DIAGNOSIS — K439 Ventral hernia without obstruction or gangrene: Secondary | ICD-10-CM | POA: Insufficient documentation

## 2017-04-05 MED ORDER — IOPAMIDOL (ISOVUE-300) INJECTION 61%
100.0000 mL | Freq: Once | INTRAVENOUS | Status: AC | PRN
  Administered 2017-04-05: 100 mL via INTRAVENOUS

## 2017-04-13 ENCOUNTER — Telehealth: Payer: Self-pay | Admitting: *Deleted

## 2017-04-13 NOTE — Telephone Encounter (Signed)
I called pt and let her know that I sent her scan to Janese Banks and she responded back stating that you were never seen by Dr. Janese Banks and she really belongs to Dr. Mike Gip and she missed her last visit and was never r/s.  Per pt when she was last in to see md she was not tolerating the AI and had stopped it. She was telling me that her quality of life was bad and she does not want to go through life feeling so miserable so she did not go back on it. I explained to her that the doctors always tries all 3 meds and then sometimes tamoxifen too so that pt's have the benefit of medication to prevent recurrence. I am not sure at this time since she has been off drug for so long if it is benefit any longer. She is agreeable to seeing corcoran in [place] at 3:15 tom.

## 2017-04-14 ENCOUNTER — Inpatient Hospital Stay: Payer: 59

## 2017-04-14 ENCOUNTER — Inpatient Hospital Stay: Payer: 59 | Attending: Hematology and Oncology | Admitting: Hematology and Oncology

## 2017-04-14 ENCOUNTER — Encounter: Payer: Self-pay | Admitting: Hematology and Oncology

## 2017-04-14 ENCOUNTER — Other Ambulatory Visit: Payer: Self-pay | Admitting: Hematology and Oncology

## 2017-04-14 VITALS — BP 139/88 | HR 78 | Temp 98.2°F | Resp 18 | Wt 238.1 lb

## 2017-04-14 DIAGNOSIS — K769 Liver disease, unspecified: Secondary | ICD-10-CM | POA: Insufficient documentation

## 2017-04-14 DIAGNOSIS — Z853 Personal history of malignant neoplasm of breast: Secondary | ICD-10-CM

## 2017-04-14 DIAGNOSIS — Z170 Estrogen receptor positive status [ER+]: Secondary | ICD-10-CM

## 2017-04-14 DIAGNOSIS — C773 Secondary and unspecified malignant neoplasm of axilla and upper limb lymph nodes: Secondary | ICD-10-CM | POA: Diagnosis not present

## 2017-04-14 DIAGNOSIS — C50912 Malignant neoplasm of unspecified site of left female breast: Secondary | ICD-10-CM | POA: Diagnosis not present

## 2017-04-14 LAB — COMPREHENSIVE METABOLIC PANEL
ALT: 14 U/L (ref 14–54)
AST: 20 U/L (ref 15–41)
Albumin: 4.3 g/dL (ref 3.5–5.0)
Alkaline Phosphatase: 82 U/L (ref 38–126)
Anion gap: 6 (ref 5–15)
BUN: 17 mg/dL (ref 6–20)
CO2: 30 mmol/L (ref 22–32)
Calcium: 8.9 mg/dL (ref 8.9–10.3)
Chloride: 98 mmol/L — ABNORMAL LOW (ref 101–111)
Creatinine, Ser: 0.99 mg/dL (ref 0.44–1.00)
GFR calc Af Amer: >60 mL/min (ref >60)
GFR calc non Af Amer: >60 mL/min (ref >60)
Glucose, Bld: 90 mg/dL (ref 65–99)
Potassium: 3.3 mmol/L — ABNORMAL LOW (ref 3.5–5.1)
Sodium: 134 mmol/L — ABNORMAL LOW (ref 135–145)
Total Bilirubin: 0.3 mg/dL (ref 0.3–1.2)
Total Protein: 8.1 g/dL (ref 6.5–8.1)

## 2017-04-14 LAB — CBC WITH DIFFERENTIAL/PLATELET
Basophils Absolute: 0.1 10*3/uL (ref 0–0.1)
Basophils Relative: 1 %
Eosinophils Absolute: 0.1 10*3/uL (ref 0–0.7)
Eosinophils Relative: 1 %
HCT: 40.3 % (ref 35.0–47.0)
Hemoglobin: 13.7 g/dL (ref 12.0–16.0)
Lymphocytes Relative: 37 %
Lymphs Abs: 3.7 10*3/uL — ABNORMAL HIGH (ref 1.0–3.6)
MCH: 28.9 pg (ref 26.0–34.0)
MCHC: 34 g/dL (ref 32.0–36.0)
MCV: 85 fL (ref 80.0–100.0)
Monocytes Absolute: 0.5 10*3/uL (ref 0.2–0.9)
Monocytes Relative: 5 %
Neutro Abs: 5.5 10*3/uL (ref 1.4–6.5)
Neutrophils Relative %: 56 %
Platelets: 299 10*3/uL (ref 150–440)
RBC: 4.75 MIL/uL (ref 3.80–5.20)
RDW: 14 % (ref 11.5–14.5)
WBC: 9.9 10*3/uL (ref 3.6–11.0)

## 2017-04-14 NOTE — Progress Notes (Signed)
[facility] day:  04/14/2017   Chief Complaint: Heather Dudley is a 57 y.o. female with stage IIA left breast cancer who is seen for reassessment.  HPI:  The patient was last seen in the medical oncology clinic on 02/05/2016.  At that time, she had new macroglossia.  Aromasin and lisinopril were discontinued without change in symptoms.  Vasomotor symptoms were managed with Effexor.  Labs revealed a normal SPEP and free light chains.   She was lost to follow-up.  She underwent bilateral tonsillectomy on 06/08/2016 at [facility] by Dr. Colin Benton for obstructive sleep apnea. Patient has had a follow up (post op) PSG, which indicates that continued nocturnal PAP therapy was indicated. Patient states, "It is uncomfortable. I have decided not to use it anymore".   Bilateral mammogram on 07/17/2016 revealed no evidence of malignancy.  She was seen in the [facility] ER on 03/21/2017 with chest pain. CBC and CMP were normal.  Chest CT angiogram revealed no evidence of pulmonary embolism.  There was a 2.3 cm  indeterminate lesion in the right lobe of the liver seen only on the lower image.  Recommendation was for contrast enhanced CT abdomen and pelvis.  Abdomen and pelvic CT on 04/05/2017 revealed a 2.4 cm hypodensity over the right lobe of the liver unchanged in size from 2013 and compatible with a hemangioma. A few small liver cysts were described.  There was a small midline ventral hernia just above the umbilicus containing only peritoneal fat.  She stopped her hormonal therapy for breast cancer as she was "so miserable".  She decided to "take my chances with life".  Symptomatically, she notes an an intermittent "pin prick" sensation in her heart.  She has been referred to cardiology. Patient has a strong family history of "heart problems". She is scheduled to see Dr. Rockey Situ "next month".  She is under a lot of work related stress.   She complains of chronic  joint aches. She does not verbalize any breast concerns. She denies B symptoms and interval infections.  She denies pain in the clinic today.  Patient performs monthly self breast examinations as recommended.    Past Medical History:  Diagnosis Date  . Basal cell carcinoma of forehead    birthmark of forehead  . Breast screening, unspecified   . Cellulitis and abscess of trunk   . Hernia   . History of chemotherapy 2012  . Hypertension   . Lump or mass in breast   . Malignant neoplasm of breast (female), unspecified site    She underwent wide excision, mastoplasty and axillary dissection for her left breast malignancy on September 03, 2010. The primary tumor was 1 cm in diameter, and a single positive axillary node 1.3 cm in diameter. This was an ER-positive, PR slightly positive, HER-2/neu not overexpressing tumor. She tolerated her whole breast radiation without difficulty ending in late February 2013.  . Malignant neoplasm of upper-outer quadrant of female breast ([place])   . 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 Obesity, unspecified   . Personal history of malignant neoplasm of breast    The patient underwent wide excision, mastoplasty. Dissection for a T1b, N1, M0 carcinoma left breast on September 03, 2010.The primary tumor was 1 cm in diameter, and a single positive axillary node 1.3 cm in diameter. This was an ER-positive, PR slightly positive, HER-2/neu not overexpressing tumor.  The patient completed whole breast radiation in  February 2013.  [name] Screening for obesity   . Thyroid nodule     Past Surgical History:  Procedure Laterality Date  . ABDOMINAL HYSTERECTOMY  2011  . birth mark removed    . BREAST CYST ASPIRATION Right    negative 01/2010  . BREAST EXCISIONAL BIOPSY  August 05, 2010   left breast positive 07/2010  . BREAST LUMPECTOMY  2012   left breast  . COLONOSCOPY  July 29, 2012   normal study.  . robotic tonsillectomy Lingual tonsils Bilateral  06/08/2016  . THYROID SURGERY     partially removed  . TONSILLECTOMY    . TUBAL LIGATION      Family History  Problem Relation Age of Onset  . Colon cancer Other   . Ovarian cancer Other   . Cancer Mother        lung ca  . Cancer Maternal Uncle        lung ca  . Cancer Paternal Aunt        ovarian ca  . Cancer Cousin        female ca    Social History:  reports that  has never smoked. she has never used smokeless tobacco. She reports that she does not drink alcohol or use drugs.  She is from [place].  The patient is alone today.  Allergies:  Allergies  Allergen Reactions  . Ace Inhibitors Swelling  . Penicillins Rash    Has patient had a PCN reaction causing immediate rash, facial/tongue/throat swelling, SOB or lightheadedness with hypotension: Yes Has patient had a PCN reaction causing severe rash involving mucus membranes or skin necrosis: No Has patient had a PCN reaction that required hospitalization: No Has patient had a PCN reaction occurring within the last 10 years: No If all of the above answers are "NO", then may proceed with Cephalosporin use.    Current Medications: Current Outpatient Medications  Medication Sig Dispense Refill  . B Complex-Biotin-FA (B-COMPLEX PO) Take 1 tablet by mouth daily.    . Ergocalciferol (VITAMIN D2) 400 units TABS Take 400 Units by mouth daily.     . hydrochlorothiazide (HYDRODIURIL) 25 MG tablet TAKE 1 TABLET (25 MG TOTAL) BY MOUTH DAILY. 90 tablet 2  . LORazepam (ATIVAN) 0.5 MG tablet Take 1 tablet (0.5 mg total) by mouth every 8 (eight) hours as needed for anxiety. 20 tablet 0  . Multiple Vitamin (MULTIVITAMIN) tablet Take 1 tablet by mouth daily.     No current facility-administered medications for this visit.     Review of Systems:  GENERAL:  Feels "pretty good".  No fevers or sweats.  Weight down 7 pounds since 01/2016. PERFORMANCE STATUS (ECOG):  0 HEENT:  Tongue swelling (no change).  No visual changes, runny nose, sore  throat, mouth sores or tenderness. Lungs: No shortness of breath or cough.  No hemoptysis.  Qualified for CPAP after sleep apnea testing (patient declined). Cardiac:  No chest pain, palpitations, orthopnea, or PND. GI:  No nausea, vomiting, diarrhea, constipation, melena or hematochezia. GU:  No urgency, frequency, dysuria, or hematuria. Musculoskeletal:  No back pain.  Joint aches for a "long time" (takes Advil).  No muscle tenderness. Extremities:  No pain or swelling. Skin:  No rashes or skin changes. Neuro:  No headache, numbness or weakness, balance or coordination issues. Endocrine:  s/p partial thyroidectomy.  No diabetes.  Hot flashes.  No night sweats. Psych:  Stress at work.  No mood changes or depression. Pain:  No focal pain.  Review of systems:  All other systems reviewed and found to be negative.  Physical Exam: Blood pressure 139/88, pulse 78, temperature 98.2 F (36.8 C), temperature source Tympanic, resp. rate 18, weight 238 lb 1.6 oz (108 kg), last menstrual period 04/01/2008. GENERAL:  Well developed, well nourished, heavyset woman sitting comfortably in the exam room in no acute distress. MENTAL STATUS:  Alert and oriented to person, place and time. HEAD:  Short brown hair.  Normocephalic, atraumatic, face symmetric, no Cushingoid features. EYES:  Brown eyes.  Pupils equal round and reactive to light and accomodation.  No conjunctivitis or scleral icterus. ENT:  Oropharynx clear without lesion.  Mild macroglossia (thick tongue).  Mucous membranes moist.  RESPIRATORY:  Clear to auscultation without rales, wheezes or rhonchi. CARDIOVASCULAR:  Regular rate and rhythm without murmur, rub or gallop. BREAST:  Right breast without masses, skin changes or nipple discharge.  Left breast s/p lumpectomy at the 9 o'clock position.  Nodule inferior border of incision (? scar).  No skin changes or nipple discharge.  ABDOMEN:  Soft, non-tender, with active bowel sounds, and no  appreciable hepatosplenomegaly.  No masses. SKIN:  No rashes, ulcers or lesions. EXTREMITIES: No edema, no skin discoloration or tenderness.  No palpable cords. LYMPH NODES: No palpable cervical, supraclavicular, axillary or inguinal adenopathy  NEUROLOGICAL: Unremarkable. PSYCH:  Appropriate.   No visits with results within 3 Day(s) from this visit.  Latest known visit with results is:  Admission on 03/21/2017, Discharged on 03/21/2017  Component Date Value Ref Range Status  . Troponin I 03/21/2017 <0.03  <0.03 ng/mL Final   Performed at [facility], [address] [place], [place] [address]  . WBC 03/21/2017 10.1  3.6 - 11.0 K/uL Final  . RBC 03/21/2017 4.68  3.80 - 5.20 MIL/uL Final  . Hemoglobin 03/21/2017 13.3  12.0 - 16.0 g/dL Final  . HCT 03/21/2017 39.9  35.0 - 47.0 % Final  . MCV 03/21/2017 85.2  80.0 - 100.0 fL Final  . MCH 03/21/2017 28.3  26.0 - 34.0 pg Final  . MCHC 03/21/2017 33.2  32.0 - 36.0 g/dL Final  . RDW 03/21/2017 14.0  11.5 - 14.5 % Final  . Platelets 03/21/2017 286  150 - 440 K/uL Final  . Neutrophils Relative % 03/21/2017 61  % Final  . Neutro Abs 03/21/2017 6.2  1.4 - 6.5 K/uL Final  . Lymphocytes Relative 03/21/2017 29  % Final  . Lymphs Abs 03/21/2017 2.9  1.0 - 3.6 K/uL Final  . Monocytes Relative 03/21/2017 7  % Final  . Monocytes Absolute 03/21/2017 0.7  0.2 - 0.9 K/uL Final  . Eosinophils Relative 03/21/2017 2  % Final  . Eosinophils Absolute 03/21/2017 0.2  0 - 0.7 K/uL Final  . Basophils Relative 03/21/2017 1  % Final  . Basophils Absolute 03/21/2017 0.1  0 - 0.1 K/uL Final   Performed at [facility], [address] [place], [place] [address]  . Sodium 03/21/2017 138  135 - 145 mmol/L Final  . Potassium 03/21/2017 3.3* 3.5 - 5.1 mmol/L Final  . Chloride 03/21/2017 102  101 - 111 mmol/L Final  . CO2 03/21/2017 25  22 - 32 mmol/L Final  . Glucose, Bld 03/21/2017 113* 65 - 99 mg/dL Final  . BUN 03/21/2017 15  6 - 20 mg/dL  Final  . Creatinine, Ser 03/21/2017 1.13* 0.44 - 1.00 mg/dL Final  . Calcium 03/21/2017 9.4  8.9 - 10.3 mg/dL Final  . Total Protein 03/21/2017 7.7  6.5 - 8.1 g/dL Final  . Albumin 03/21/2017 4.0  3.5 - 5.0 g/dL Final  . AST 03/21/2017 22  15 - 41 U/L Final  . ALT 03/21/2017 12* 14 - 54 U/L Final  . Alkaline Phosphatase 03/21/2017 74  38 - 126 U/L Final  . Total Bilirubin 03/21/2017 0.5  0.3 - 1.2 mg/dL Final  . GFR calc non Af Amer 03/21/2017 54* >60 mL/min Final  . GFR calc Af Amer 03/21/2017 >60  >60 mL/min Final   Comment: (NOTE) The eGFR has been calculated using the CKD EPI equation. This calculation has not been validated in all clinical situations. eGFR's persistently <60 mL/min signify possible Chronic Kidney Disease.   Georgiann Hahn gap 03/21/2017 11  5 - 15 Final   Performed at [facility], [address] [place], [place] [address]  . Troponin I 03/21/2017 <0.03  <0.03 ng/mL Final   Performed at [facility], [address] [place], [place] [address]    Assessment:  AVONNA IRIBE is a 57 y.o. female with stage IIA left breast cancer s/p wide excision and axillary lymph node dissection on 09/03/2010.  Pathology revealed a 1.0 cm grade II invasive ductal carcinoma. One of 14 lymph nodes were positive macrometastasis of 1.3 cm.  Pathologic stage was T1cN1bM0.  Tumor was ER positive (90%), PR positive (20%), and HER-2/neu negative by FISH (Her-2/CEP17 ratio < 1.8).  She enrolled on NSABP B-47. She received AC 4 followed by weekly paclitaxel (completed on 02/06/2011).  She received breast radiation in 03/2011.  She completed a year of adjuvant Herceptin on 11/13/2011. She was on Arimidex from 07/2011 - 07/2015 and Femara from 09/04/2015 - 10/09/2015.  She stopped Arimidex and Femara secondary to vasomotor symptoms.  She began Aromasin on 10/09/2015 (discontinued in 11/2015 or 12/2015).  Breast cancer index (BCI) testing on 10/08/2015 revealed a high risk of  late recurrence  Risk is estimated at 12.7% (CI: 7.4%-17.7%) during years 5-10.   She has a high likelihood of benefit from extended adjuvant hormonal therapy.  Bilateral mammogram on 12/10/2014 revealed lumpectomy changes in the left breast without evidence of malignancy.  Bilateral mammogram on 07/17/2016 revealed no evidence of malignancy.  CA27.29 has been followed: 17.4 on 11/02/2013, 21 on 12/20/2014, 19.5 on 09/04/2015, 21.7 on 10/09/2015, 17.6 on 02/05/2016, and 18.6 on 04/14/2017.  Chest CT angiogram on 03/21/2017 revealed no evidence of pulmonary embolism.  There was a 2.3 cm  indeterminate lesion in the right lobe of the liver.  Abdomen and pelvic CT on 04/05/2017 revealed a 2.4 cm hypodensity over the right lobe of the liver unchanged in size from 2013 and compatible with a hemangioma.  There were a ffew small liver cysts were described.    Bone density study on 12/04/2015 was normal with a T-score of -0.9 in the AP spine L1-L4 and -0.6 in the left femoral neck.  She has vasomotor symptoms well managed on Effexor.  She underwent bilateral tonsillectomy on 06/08/2016 at [facility] for obstructive sleep apnea.  Symptomatically, she feels "pretty good".  Exam reveals significant scar tissue in her LEFT breast with questionable nodularity below the incision.    Plan: 1.  Labs today:  CBC with diff, CMP, CA27.29.  2.  Review mammogram from 06/2016- no evidence of malignancy. 3.  Review interval chest CT angiogram and abdomen/pelvic CT- no evidence of malignancy.  Liver hemangioma. 4.  Discuss patient's decision not to pursue endocrine therapy. 5.  Discuss history of macroglossia and interval  bilateral tonsillectomy. 6.  Schedule for mammogram and LEFT breast ultrasound on 07/01/2017. 7.  RTC after mammogram for MD assessment and review of mammogram/ultrasound.   Honor Loh, NP  04/14/2017, 4:29 PM   I saw and evaluated the patient, participating in the key portions of the service and  reviewing pertinent diagnostic studies and records.  I reviewed the nurse practitioner's note and agree with the findings and the plan.  The assessment and plan were discussed with the patient.  Additional diagnostic studies of a diagnostic left mammogram and ultrasound are needed and would change the clinical management.  Multiple questions were asked by the patient and answered.   Nolon Stalls, MD 04/14/2017, 4:29 PM

## 2017-04-14 NOTE — Progress Notes (Signed)
Patent has been following up with PCP.  She had a recent scan - last Monday, that showed a couple of spots on her liver.  According to her they have been there a while and she was not aware, however they have not changed in size.  Patient is under a lot of stress at work.  She reports going to ED recently for chest pain which she was told could be angina.  She has a cardiology appointment next month.  When she went to the ED she was having "prickling sensations in her chest, with SOB".

## 2017-04-15 ENCOUNTER — Telehealth: Payer: Self-pay | Admitting: *Deleted

## 2017-04-15 LAB — CANCER ANTIGEN 27.29: CA 27.29: 18.6 U/mL (ref 0.0–38.6)

## 2017-04-15 NOTE — Telephone Encounter (Signed)
Called patient to inform her that K+ is a little low.  Encouraged K+ rich foods.  She states she will.

## 2017-04-15 NOTE — Telephone Encounter (Signed)
-----   Message from Lequita Asal, MD sent at 04/15/2017  3:51 AM EST ----- Regarding: Please call patient and send to PCP  Potassium a little low.  M  ----- Message ----- From: Interface, Lab In [place] Sent: 04/14/2017   4:48 PM To: Lequita Asal, MD

## 2017-04-19 ENCOUNTER — Telehealth: Payer: Self-pay | Admitting: Internal Medicine

## 2017-04-19 NOTE — Telephone Encounter (Signed)
LVMTCB

## 2017-04-19 NOTE — Telephone Encounter (Signed)
-----   Message from Shirlean Kelly, RN sent at 04/15/2017  4:33 PM EST ----- Patient was seen in our clinic on Wednesday, February 21.  Dr. Mike Gip asked that I forward the labs to you for your review.  I have called the patient to advise her to eat K+ rich foods.  Rodena Piety, RN

## 2017-04-19 NOTE — Telephone Encounter (Signed)
Please call patient and check if she is feeling well and taking in potassium rich foods. I see that she has another appt with Oncology in 10 days.  She should ask them to recheck her potassium or she can come here to have it rechecked. However, it is up to her, whichever way she wants to handle it.

## 2017-04-22 ENCOUNTER — Ambulatory Visit
Admission: RE | Admit: 2017-04-22 | Discharge: 2017-04-22 | Disposition: A | Discharge status: Home / Self Care | Payer: 59 | Source: Ambulatory Visit | Attending: Urgent Care | Admitting: Urgent Care

## 2017-04-22 DIAGNOSIS — Z170 Estrogen receptor positive status [ER+]: Principal | ICD-10-CM

## 2017-04-22 DIAGNOSIS — C50912 Malignant neoplasm of unspecified site of left female breast: Secondary | ICD-10-CM

## 2017-04-22 DIAGNOSIS — Z853 Personal history of malignant neoplasm of breast: Secondary | ICD-10-CM | POA: Diagnosis not present

## 2017-04-22 DIAGNOSIS — N6320 Unspecified lump in the left breast, unspecified quadrant: Secondary | ICD-10-CM | POA: Diagnosis not present

## 2017-04-22 DIAGNOSIS — R922 Inconclusive mammogram: Secondary | ICD-10-CM | POA: Diagnosis not present

## 2017-04-28 ENCOUNTER — Encounter: Payer: Self-pay | Admitting: Hematology and Oncology

## 2017-04-28 ENCOUNTER — Other Ambulatory Visit: Payer: Self-pay | Admitting: *Deleted

## 2017-04-28 ENCOUNTER — Inpatient Hospital Stay: Payer: 59 | Attending: Hematology and Oncology | Admitting: Hematology and Oncology

## 2017-04-28 VITALS — BP 140/87 | HR 87 | Temp 97.9°F | Wt 236.8 lb

## 2017-04-28 DIAGNOSIS — C50912 Malignant neoplasm of unspecified site of left female breast: Secondary | ICD-10-CM | POA: Diagnosis not present

## 2017-04-28 DIAGNOSIS — C773 Secondary and unspecified malignant neoplasm of axilla and upper limb lymph nodes: Secondary | ICD-10-CM

## 2017-04-28 DIAGNOSIS — K769 Liver disease, unspecified: Secondary | ICD-10-CM | POA: Diagnosis not present

## 2017-04-28 DIAGNOSIS — Z170 Estrogen receptor positive status [ER+]: Principal | ICD-10-CM

## 2017-04-28 NOTE — Progress Notes (Signed)
[facility] day:  04/28/2017   Chief Complaint: Heather Dudley is a 57 y.o. female with stage IIA left breast cancer who is seen for review of interval mammogram and ultrasound.  HPI:  The patient was last seen in the medical oncology clinic on 04/14/2017.  At that time, she was seen for reassessment after being lost to follow-up for 14 months.  She felt "pretty good".  She had chronic joint aches.  She had decided not to continue hormonal therapy.  Exam revealed significant scar tissue in her LEFT breast with nodularity below the incision (? scar tissue).   Labs revealed a normal CBC with diff, CMP (except for a potassium of 3.3), and CA27.29.  Left sided mammogram and ultrasound on 04/22/2017 revealed postsurgical scar with dense postsurgical calcifications unchanged compared to prior exam. There was no suspicious abnormality.  Targeted ultrasound showed postsurgical scar calcification in the palpable area lateral inferior portion of the left breast.  There was no suspicious mass is identified in the palpable area.  During the interim, patient is doing well. She denies any acute concerns. Patient noted to have low potassium during her last visit. Patient states, "I am now eating 2 bananas instead of one". Patient has no B symptoms or interval infections. Patient is eating well. Her weight is down 2 pounds.  Patient denies pain in the clinic today.    Past Medical History:  Diagnosis Date  . Basal cell carcinoma of forehead    birthmark of forehead  . Breast screening, unspecified   . Cellulitis and abscess of trunk   . Hernia   . History of chemotherapy 2012  . Hypertension   . Lump or mass in breast   . Malignant neoplasm of breast (female), unspecified site    She underwent wide excision, mastoplasty and axillary dissection for her left breast malignancy on September 03, 2010. The primary tumor was 1 cm in diameter, and a single positive  axillary node 1.3 cm in diameter. This was an ER-positive, PR slightly positive, HER-2/neu not overexpressing tumor. She tolerated her whole breast radiation without difficulty ending in late February 2013.  . Malignant neoplasm of upper-outer quadrant of female breast ([place])   . 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 Obesity, unspecified   . Personal history of malignant neoplasm of breast    The patient underwent wide excision, mastoplasty. Dissection for a T1b, N1, M0 carcinoma left breast on September 03, 2010.The primary tumor was 1 cm in diameter, and a single positive axillary node 1.3 cm in diameter. This was an ER-positive, PR slightly positive, HER-2/neu not overexpressing tumor.  The patient completed whole breast radiation in February 2013.  [name] Screening for obesity   . Thyroid nodule     Past Surgical History:  Procedure Laterality Date  . ABDOMINAL HYSTERECTOMY  2011  . birth mark removed    . BREAST CYST ASPIRATION Right    negative 01/2010  . BREAST EXCISIONAL BIOPSY  August 05, 2010   left breast positive 07/2010  . BREAST LUMPECTOMY  2012   left breast  . COLONOSCOPY  July 29, 2012   normal study.  . robotic tonsillectomy Lingual tonsils Bilateral 06/08/2016  . THYROID SURGERY     partially removed  . TONSILLECTOMY    . TUBAL LIGATION      Family History  Problem Relation Age of Onset  . Colon cancer Other   .  Ovarian cancer Other   . Cancer Mother        lung ca  . Cancer Maternal Uncle        lung ca  . Cancer Paternal Aunt        ovarian ca  . Cancer Cousin        female ca  . Breast cancer Neg Hx     Social History:  reports that  has never smoked. she has never used smokeless tobacco. She reports that she does not drink alcohol or use drugs.  She is from [place].  The patient is alone today.  Allergies:  Allergies  Allergen Reactions  . Ace Inhibitors Swelling  . Penicillins Rash    Has patient had a PCN reaction causing  immediate rash, facial/tongue/throat swelling, SOB or lightheadedness with hypotension: Yes Has patient had a PCN reaction causing severe rash involving mucus membranes or skin necrosis: No Has patient had a PCN reaction that required hospitalization: No Has patient had a PCN reaction occurring within the last 10 years: No If all of the above answers are "NO", then may proceed with Cephalosporin use.    Current Medications: Current Outpatient Medications  Medication Sig Dispense Refill  . B Complex-Biotin-FA (B-COMPLEX PO) Take 1 tablet by mouth daily.    . Ergocalciferol (VITAMIN D2) 400 units TABS Take 400 Units by mouth daily.     . hydrochlorothiazide (HYDRODIURIL) 25 MG tablet TAKE 1 TABLET (25 MG TOTAL) BY MOUTH DAILY. 90 tablet 2  . LORazepam (ATIVAN) 0.5 MG tablet Take 1 tablet (0.5 mg total) by mouth every 8 (eight) hours as needed for anxiety. 20 tablet 0  . Multiple Vitamin (MULTIVITAMIN) tablet Take 1 tablet by mouth daily.     No current facility-administered medications for this visit.     Review of Systems:  GENERAL:  Feels "pretty good".  No fevers or sweats.  Weight down 2 pounds since last visit.  PERFORMANCE STATUS (ECOG):  0 HEENT:  Tongue swelling (no change).  No visual changes, runny nose, sore throat, mouth sores or tenderness. Lungs: No shortness of breath or cough.  No hemoptysis.  Qualified for CPAP after sleep apnea testing (patient declined). Cardiac:  No chest pain, palpitations, orthopnea, or PND. GI:  No nausea, vomiting, diarrhea, constipation, melena or hematochezia. GU:  No urgency, frequency, dysuria, or hematuria. Musculoskeletal:  No back pain.  Joint aches for a "long time" (takes Advil).  No muscle tenderness. Extremities:  No pain or swelling. Skin:  No rashes or skin changes. Neuro:  No headache, numbness or weakness, balance or coordination issues. Endocrine:  s/p partial thyroidectomy.  No diabetes.  Hot flashes.  No night sweats. Psych:   Stress at work.  No mood changes or depression. Pain:  No focal pain. Review of systems:  All other systems reviewed and found to be negative.  Physical Exam: Blood pressure 140/87, pulse 87, temperature 97.9 F (36.6 C), temperature source Tympanic, weight 236 lb 12.4 oz (107.4 kg), last menstrual period 04/01/2008. GENERAL:  Well developed, well nourished, heavyset woman sitting comfortably in the exam room in no acute distress. MENTAL STATUS:  Alert and oriented to person, place and time. HEAD:  Short brown hair.  Normocephalic, atraumatic, face symmetric, no Cushingoid features. EYES:  Brown eyes.  No conjunctivitis or scleral icterus. NEUROLOGICAL: Unremarkable. PSYCH:  Appropriate.   No visits with results within 3 Day(s) from this visit.  Latest known visit with results is:  Office Visit on 04/14/2017  Component Date Value Ref Range Status  . CA 27.29 04/14/2017 18.6  0.0 - 38.6 U/mL Final   Comment: (NOTE) Siemens Centaur Immunochemiluminometric Methodology [facility]) Values obtained with different assay methods or kits cannot be used interchangeably. Results cannot be interpreted as absolute evidence of the presence or absence of malignant disease. Performed At: [facility] [place], [place] [id number] Rush Farmer MD IO:[id number] Performed at [facility], [address] [place], [place] [address]   . Sodium 04/14/2017 134* 135 - 145 mmol/L Final  . Potassium 04/14/2017 3.3* 3.5 - 5.1 mmol/L Final  . Chloride 04/14/2017 98* 101 - 111 mmol/L Final  . CO2 04/14/2017 30  22 - 32 mmol/L Final  . Glucose, Bld 04/14/2017 90  65 - 99 mg/dL Final  . BUN 04/14/2017 17  6 - 20 mg/dL Final  . Creatinine, Ser 04/14/2017 0.99  0.44 - 1.00 mg/dL Final  . Calcium 04/14/2017 8.9  8.9 - 10.3 mg/dL Final  . Total Protein 04/14/2017 8.1  6.5 - 8.1 g/dL Final  . Albumin 04/14/2017 4.3  3.5 - 5.0 g/dL Final  . AST 04/14/2017 20  15 - 41 U/L Final  .  ALT 04/14/2017 14  14 - 54 U/L Final  . Alkaline Phosphatase 04/14/2017 82  38 - 126 U/L Final  . Total Bilirubin 04/14/2017 0.3  0.3 - 1.2 mg/dL Final  . GFR calc non Af Amer 04/14/2017 >60  >60 mL/min Final  . GFR calc Af Amer 04/14/2017 >60  >60 mL/min Final   Comment: (NOTE) The eGFR has been calculated using the CKD EPI equation. This calculation has not been validated in all clinical situations. eGFR's persistently <60 mL/min signify possible Chronic Kidney Disease.   Georgiann Hahn gap 04/14/2017 6  5 - 15 Final   Performed at [facility], [address] [place], [place] [address]  . WBC 04/14/2017 9.9  3.6 - 11.0 K/uL Final  . RBC 04/14/2017 4.75  3.80 - 5.20 MIL/uL Final  . Hemoglobin 04/14/2017 13.7  12.0 - 16.0 g/dL Final  . HCT 04/14/2017 40.3  35.0 - 47.0 % Final  . MCV 04/14/2017 85.0  80.0 - 100.0 fL Final  . MCH 04/14/2017 28.9  26.0 - 34.0 pg Final  . MCHC 04/14/2017 34.0  32.0 - 36.0 g/dL Final  . RDW 04/14/2017 14.0  11.5 - 14.5 % Final  . Platelets 04/14/2017 299  150 - 440 K/uL Final  . Neutrophils Relative % 04/14/2017 56  % Final  . Neutro Abs 04/14/2017 5.5  1.4 - 6.5 K/uL Final  . Lymphocytes Relative 04/14/2017 37  % Final  . Lymphs Abs 04/14/2017 3.7* 1.0 - 3.6 K/uL Final  . Monocytes Relative 04/14/2017 5  % Final  . Monocytes Absolute 04/14/2017 0.5  0.2 - 0.9 K/uL Final  . Eosinophils Relative 04/14/2017 1  % Final  . Eosinophils Absolute 04/14/2017 0.1  0 - 0.7 K/uL Final  . Basophils Relative 04/14/2017 1  % Final  . Basophils Absolute 04/14/2017 0.1  0 - 0.1 K/uL Final   Performed at [facility], [address], [place], [place] [address]    Assessment:  QUENTIN STREBEL is a 57 y.o. female with stage IIA left breast cancer s/p wide excision and axillary lymph node dissection on 09/03/2010.  Pathology revealed a 1.0 cm grade II invasive ductal carcinoma. One of 14 lymph nodes were positive macrometastasis of 1.3 cm.   Pathologic stage was  [id number].  Tumor was ER positive (90%), PR positive (20%), and HER-2/neu negative by FISH (Her-2/CEP17 ratio < 1.8).  She enrolled on NSABP B-47. She received AC 4 followed by weekly paclitaxel (completed on 02/06/2011).  She received breast radiation in 03/2011.  She completed a year of adjuvant Herceptin on 11/13/2011. She was on Arimidex from 07/2011 - 07/2015 and Femara from 09/04/2015 - 10/09/2015.  She stopped Arimidex and Femara secondary to vasomotor symptoms.  She began Aromasin on 10/09/2015 (discontinued in 11/2015 or 12/2015).  Breast cancer index (BCI) testing on 10/08/2015 revealed a high risk of late recurrence  Risk is estimated at 12.7% (CI: 7.4%-17.7%) during years 5-10.   She has a high likelihood of benefit from extended adjuvant hormonal therapy.  Bilateral mammogram on 12/10/2014 revealed lumpectomy changes in the left breast without evidence of malignancy.  Bilateral mammogram on 07/17/2016 revealed no evidence of malignancy.  Left sided mammogram and ultrasound on 04/22/2017 revealed postsurgical scar with dense postsurgical calcifications unchanged compared to prior exam. There was no suspicious abnormality.  Targeted ultrasound showed postsurgical scar calcification in the palpable area lateral inferior portion of the left breast.  There was no suspicious mass is identified in the palpable area.  CA27.29 has been followed: 17.4 on 11/02/2013, 21 on 12/20/2014, 19.5 on 09/04/2015, 21.7 on 10/09/2015, 17.6 on 02/05/2016, and 18.6 on 04/14/2017.  Chest CT angiogram on 03/21/2017 revealed no evidence of pulmonary embolism.  There was a 2.3 cm  indeterminate lesion in the right lobe of the liver.  Abdomen and pelvic CT on 04/05/2017 revealed a 2.4 cm hypodensity over the right lobe of the liver unchanged in size from 2013 and compatible with a hemangioma.  There were a ffew small liver cysts were described.    Bone density study on 12/04/2015 was normal with a  T-score of -0.9 in the AP spine L1-L4 and -0.6 in the left femoral neck.  She has vasomotor symptoms well managed on Effexor.  She underwent bilateral tonsillectomy on 06/08/2016 at [facility] for obstructive sleep apnea.  Symptomatically, she feels "pretty good".  Exam reveals significant scar tissue in her LEFT breast with questionable nodularity below the incision.  Imaging confirms scar tissue.    Plan: 1.  Review interval labs, mammogram, and ultrasound.  No evidence of malignancy. 2.  Bilateral diagnostic mammogram after 07/17/2017. 3.  RTC in 1 year for MD assessment and labs (CBC with diff, CMP, CA27.29).   Honor Loh, NP  04/28/2017, 4:27 PM   I saw and evaluated the patient, participating in the key portions of the service and reviewing pertinent diagnostic studies and records.  I reviewed the nurse practitioner's note and agree with the findings and the plan.  The assessment and plan were discussed with the patient.  A few questions were asked by the patient and answered.   Nolon Stalls, MD 04/28/2017, 4:27 PM

## 2017-04-28 NOTE — Progress Notes (Signed)
Patient offers no complaints. 

## 2017-05-02 NOTE — Progress Notes (Signed)
Cardiology Office Note  Date:  05/04/2017   ID:  Heather Dudley, DOB 1960/03/29, MRN [id number]  PCP:  Glean Hess, MD   Chief Complaint  Patient presents with  . OTHER    F/u ED chest pain. Meds reviewed verbally with pt.    HPI:  Heather Dudley is a 57 y.o. female  HTN Nonsmoker No diabetes Chronic arm pain,  lumpectomy and lymph node dissection.  Chemo, breast cancer on the left Who presents by referral from the emergency room for evaluation of chest pain  Reports that March 21, 2017 she awoke from sleep with tachycardia("beating fast") Developed some tightness in her chest, laid in bed to see if it would go away Felt somewhat tight, Took aspirin Went to the emergency room Started getting better before ER Described the pain in her chest as a  "pricking" sensation radiate somewhat to her left shoulder  Since that time she has been fine  Occasionally with symptoms at rest described as a prickling  She has had other episodes of tachycardia, typically lasting 10 minutes or so She feels symptoms could be secondary to low potassium Typically when she has tachycardia she lays down and symptoms resolve after deep breaths Sometimes takes Ativan  History of lumpectomy and lymph node dissection.  Sometimes this causes discomfort in her left arm  Lab work from the emergency room reviewed showing negative cardiac enzymes Hospital records reviewed with the patient in detail CR 1.13, potassium 3.3 Chronically low potassium likely from HCTZ  CT chest: Images pulled up in the office today as well as abdominal CT Liver lesion Hemangioma. No significant coronary calcified atherosclerosis or aortic plaque  Otherwise relatively good exercise tolerance with no symptoms  EKG personally reviewed by myself on todays visit Shows normal sinus rhythm rate 82 bpm no significant ST or T wave changes   PMH:   has a past medical history of Basal cell carcinoma of  forehead, Breast screening, unspecified, Cellulitis and abscess of trunk, Hernia, History of chemotherapy (2012), Hypertension, Lump or mass in breast, Malignant neoplasm of breast (female), unspecified site, Malignant neoplasm of upper-outer quadrant of female breast ([place]), Neoplasm of uncertain behavior of connective and other soft tissue, Obesity, unspecified, Personal history of malignant neoplasm of breast, Screening for obesity, and Thyroid nodule.  PSH:    Past Surgical History:  Procedure Laterality Date  . ABDOMINAL HYSTERECTOMY  2011  . birth mark removed    . BREAST CYST ASPIRATION Right    negative 01/2010  . BREAST EXCISIONAL BIOPSY  August 05, 2010   left breast positive 07/2010  . BREAST LUMPECTOMY  2012   left breast  . COLONOSCOPY  July 29, 2012   normal study.  . robotic tonsillectomy Lingual tonsils Bilateral 06/08/2016  . THYROID SURGERY     partially removed  . TONSILLECTOMY    . TUBAL LIGATION      Current Outpatient Medications  Medication Sig Dispense Refill  . B Complex-Biotin-FA (B-COMPLEX PO) Take 1 tablet by mouth daily.    . Ergocalciferol (VITAMIN D2) 400 units TABS Take 400 Units by mouth daily as needed.     . hydrochlorothiazide (HYDRODIURIL) 25 MG tablet TAKE 1 TABLET (25 MG TOTAL) BY MOUTH DAILY. 90 tablet 2  . LORazepam (ATIVAN) 0.5 MG tablet Take 1 tablet (0.5 mg total) by mouth every 8 (eight) hours as needed for anxiety. 20 tablet 0  . Multiple Vitamin (MULTIVITAMIN) tablet Take 1 tablet by mouth daily.    [name]  potassium chloride SA (K-DUR,KLOR-CON) 20 MEQ tablet Take 1 tablet (20 mEq total) by mouth daily. 90 tablet 3  . propranolol (INDERAL) 20 MG tablet Take 1 tablet (20 mg total) by mouth 3 (three) times daily as needed. 90 tablet 3   No current facility-administered medications for this visit.      Allergies:   Ace inhibitors and Penicillins   Social History:  The patient  reports that  has never smoked. she has never used smokeless tobacco.  She reports that she does not drink alcohol or use drugs.   Family History:   family history includes Cancer in her cousin, maternal uncle, mother, and paternal aunt; Colon cancer in her other; Heart failure in her father; Ovarian cancer in her other.    Review of Systems: Review of Systems  Constitutional: Negative.   Respiratory: Negative.   Cardiovascular: Negative.        Prickling in her chest Tachycardia  Gastrointestinal: Negative.   Musculoskeletal: Negative.   Neurological: Negative.   Psychiatric/Behavioral: Negative.   All other systems reviewed and are negative.    PHYSICAL EXAM: VS:  BP 112/80 (BP Location: Right Arm, Patient Position: Sitting, Cuff Size: Large)   Pulse 82   Ht 5\' 7"  (1.702 m)   Wt 234 lb 8 oz (106.4 kg)   LMP 04/01/2008 Comment: Hysterectomy was due to endometriosis. - still has ovaries.   BMI 36.73 kg/m  , BMI Body mass index is 36.73 kg/m. GEN: Well nourished, well developed, in no acute distress  HEENT: normal  Neck: no JVD, carotid bruits, or masses Cardiac: RRR; no murmurs, rubs, or gallops,no edema  Respiratory:  clear to auscultation bilaterally, normal work of breathing GI: soft, nontender, nondistended, + BS MS: no deformity or atrophy  Skin: warm and dry, no rash Neuro:  Strength and sensation are intact Psych: euthymic mood, full affect    Recent Labs: 07/01/2016: TSH 3.000 04/14/2017: ALT 14; BUN 17; Creatinine, Ser 0.99; Hemoglobin 13.7; Platelets 299; Potassium 3.3; Sodium 134    Lipid Panel Lab Results  Component Value Date   CHOL 228 (H) 07/01/2016   HDL 47 07/01/2016   LDLCALC 163 (H) 07/01/2016   TRIG 90 07/01/2016      Wt Readings from Last 3 Encounters:  05/04/17 234 lb 8 oz (106.4 kg)  04/28/17 236 lb 12.4 oz (107.4 kg)  04/14/17 238 lb 1.6 oz (108 kg)       ASSESSMENT AND PLAN:  Paroxysmal tachycardia (HCC) Periodic episodes of tachycardia lasting 10 minutes or more Recent trip to the emergency room  end of January 2019, again with tachycardia starting her symptoms Recommend she take propranolol as needed for episodes Monitor heart rate and blood pressure with blood pressure cuff Likely atrial tachycardia though unable to exclude SVT If symptoms persist recommend she call our office for event monitor (she is declining one today) Unable to exclude anxiety Periodically she does take Ativan for similar symptoms with improvement  Chest pain, unspecified type - Plan: EKG 12-Lead Atypical in nature, presenting after tachycardia CT scan chest abdomen reviewed with no significant coronary calcification or aortic atherosclerosis Risk factors include radiation to the left 6-7 years ago Recommended we be vigilant with her symptoms given his radiation exposure and hyperlipidemia We did discuss types of stress testing and if she has symptoms with exertion we would have low threshold to order stress test  Hyperlipidemia, mixed Given no significant coronary calcification or aortic atherosclerosis, recommended lifestyle modification We will hold  off on statins at this time  Essential hypertension - Plan: EKG 12-Lead Previous chemotherapy andBlood pressure is well controlled on today's visit. No changes made to the medications.  Malignant neoplasm of left breast in female, estrogen receptor positive, unspecified site of breast ([place]) Previous chemotherapy and radiation on the left Lymphedema left arm  Disposition:   F/U as needed   Total encounter time more than 60 minutes  Greater than 50% was spent in counseling and coordination of care with the patient   Orders Placed This Encounter  Procedures  . EKG 12-Lead     Signed, Esmond Plants, M.D., Ph.D. 05/04/2017  [place], [place]

## 2017-05-04 ENCOUNTER — Ambulatory Visit (INDEPENDENT_AMBULATORY_CARE_PROVIDER_SITE_OTHER): Payer: 59 | Admitting: Cardiovascular Disease

## 2017-05-04 ENCOUNTER — Encounter: Payer: Self-pay | Admitting: Cardiovascular Disease

## 2017-05-04 VITALS — BP 112/80 | HR 82 | Ht 67.0 in | Wt 234.5 lb

## 2017-05-04 DIAGNOSIS — E782 Mixed hyperlipidemia: Secondary | ICD-10-CM | POA: Diagnosis not present

## 2017-05-04 DIAGNOSIS — R079 Chest pain, unspecified: Secondary | ICD-10-CM | POA: Diagnosis not present

## 2017-05-04 DIAGNOSIS — I480 Paroxysmal atrial fibrillation: Secondary | ICD-10-CM

## 2017-05-04 DIAGNOSIS — I10 Essential (primary) hypertension: Secondary | ICD-10-CM

## 2017-05-04 DIAGNOSIS — C50912 Malignant neoplasm of unspecified site of left female breast: Secondary | ICD-10-CM

## 2017-05-04 DIAGNOSIS — I479 Paroxysmal tachycardia, unspecified: Secondary | ICD-10-CM | POA: Diagnosis not present

## 2017-05-04 DIAGNOSIS — Z170 Estrogen receptor positive status [ER+]: Secondary | ICD-10-CM

## 2017-05-04 HISTORY — DX: Paroxysmal atrial fibrillation: I48.0

## 2017-05-04 MED ORDER — PROPRANOLOL HCL 20 MG PO TABS: 20.0000 mg | ORAL_TABLET | Freq: Three times a day (TID) | ORAL | 3 refills | Status: DC | PRN

## 2017-05-04 MED ORDER — POTASSIUM CHLORIDE CRYS ER 20 MEQ PO TBCR: 20.0000 meq | EXTENDED_RELEASE_TABLET | Freq: Every day | ORAL | 3 refills | Status: DC

## 2017-05-04 NOTE — Patient Instructions (Signed)
Medication Instructions:   Please start potassium 20 meq once a day (called in already)  Take propranolol 20 mg daily as needed for tachycardia Called in already  Record your heart rate when having tachycardia  Labwork:  No new labs needed  Testing/Procedures:  No further testing at this time   Follow-Up: It was a pleasure seeing you in the office today. Please call us if you have new issues that need to be addressed before your next appt.  [phone]  Your physician wants you to follow-up in:  As needed  If you need a refill on your cardiac medications before your next appointment, please call your pharmacy.  For educational health videos Log in to : [email or web address] Or : [email or web address], password : [email or web address]

## 2017-06-07 DIAGNOSIS — M62838 Other muscle spasm: Secondary | ICD-10-CM | POA: Diagnosis not present

## 2017-06-07 DIAGNOSIS — M9901 Segmental and somatic dysfunction of cervical region: Secondary | ICD-10-CM | POA: Diagnosis not present

## 2017-06-07 DIAGNOSIS — M5413 Radiculopathy, cervicothoracic region: Secondary | ICD-10-CM | POA: Diagnosis not present

## 2017-07-01 ENCOUNTER — Encounter: Payer: Self-pay | Admitting: Internal Medicine

## 2017-07-01 ENCOUNTER — Ambulatory Visit (INDEPENDENT_AMBULATORY_CARE_PROVIDER_SITE_OTHER): Payer: 59 | Admitting: Internal Medicine

## 2017-07-01 VITALS — BP 128/80 | HR 84 | Ht 67.0 in | Wt 232.0 lb

## 2017-07-01 DIAGNOSIS — E890 Postprocedural hypothyroidism: Secondary | ICD-10-CM

## 2017-07-01 DIAGNOSIS — Z9889 Other specified postprocedural states: Secondary | ICD-10-CM

## 2017-07-01 DIAGNOSIS — Z853 Personal history of malignant neoplasm of breast: Secondary | ICD-10-CM | POA: Diagnosis not present

## 2017-07-01 DIAGNOSIS — I479 Paroxysmal tachycardia, unspecified: Secondary | ICD-10-CM | POA: Diagnosis not present

## 2017-07-01 DIAGNOSIS — E782 Mixed hyperlipidemia: Secondary | ICD-10-CM

## 2017-07-01 DIAGNOSIS — I10 Essential (primary) hypertension: Secondary | ICD-10-CM | POA: Diagnosis not present

## 2017-07-01 DIAGNOSIS — F410 Panic disorder [episodic paroxysmal anxiety] without agoraphobia: Secondary | ICD-10-CM

## 2017-07-01 DIAGNOSIS — Z9009 Acquired absence of other part of head and neck: Secondary | ICD-10-CM

## 2017-07-01 DIAGNOSIS — Z0000 Encounter for general adult medical examination without abnormal findings: Secondary | ICD-10-CM

## 2017-07-01 LAB — POCT URINALYSIS DIPSTICK
BILIRUBIN UA: NEGATIVE
GLUCOSE UA: NEGATIVE
KETONES UA: NEGATIVE
Leukocytes, UA: NEGATIVE
Nitrite, UA: NEGATIVE
PH UA: 6 (ref 5.0–8.0)
Spec Grav, UA: 1.02 (ref 1.010–1.025)
UROBILINOGEN UA: 0.2 U/dL

## 2017-07-01 MED ORDER — LORAZEPAM 0.5 MG PO TABS: 0.5000 mg | ORAL_TABLET | Freq: Three times a day (TID) | ORAL | 0 refills | Status: DC | PRN

## 2017-07-01 NOTE — Patient Instructions (Signed)
Health Maintenance for Postmenopausal Women Menopause is a normal process in which your reproductive ability comes to an end. This process happens gradually over a span of months to years, usually between the ages of 22 and 9. Menopause is complete when you have missed 12 consecutive menstrual periods. It is important to talk with your health care provider about some of the most common conditions that affect postmenopausal women, such as heart disease, cancer, and bone loss (osteoporosis). Adopting a healthy lifestyle and getting preventive care can help to promote your health and wellness. Those actions can also lower your chances of developing some of these common conditions. What should I know about menopause? During menopause, you may experience a number of symptoms, such as:  Moderate-to-severe hot flashes.  Night sweats.  Decrease in sex drive.  Mood swings.  Headaches.  Tiredness.  Irritability.  Memory problems.  Insomnia.  Choosing to treat or not to treat menopausal changes is an individual decision that you make with your health care provider. What should I know about hormone replacement therapy and supplements? Hormone therapy products are effective for treating symptoms that are associated with menopause, such as hot flashes and night sweats. Hormone replacement carries certain risks, especially as you become older. If you are thinking about using estrogen or estrogen with progestin treatments, discuss the benefits and risks with your health care provider. What should I know about heart disease and stroke? Heart disease, heart attack, and stroke become more likely as you age. This may be due, in part, to the hormonal changes that your body experiences during menopause. These can affect how your body processes dietary fats, triglycerides, and cholesterol. Heart attack and stroke are both medical emergencies. There are many things that you can do to help prevent heart disease  and stroke:  Have your blood pressure checked at least every 1-2 years. High blood pressure causes heart disease and increases the risk of stroke.  If you are 53-22 years old, ask your health care provider if you should take aspirin to prevent a heart attack or a stroke.  Do not use any tobacco products, including cigarettes, chewing tobacco, or electronic cigarettes. If you need help quitting, ask your health care provider.  It is important to eat a healthy diet and maintain a healthy weight. ? Be sure to include plenty of vegetables, fruits, low-fat dairy products, and lean protein. ? Avoid eating foods that are high in solid fats, added sugars, or salt (sodium).  Get regular exercise. This is one of the most important things that you can do for your health. ? Try to exercise for at least 150 minutes each week. The type of exercise that you do should increase your heart rate and make you sweat. This is known as moderate-intensity exercise. ? Try to do strengthening exercises at least twice each week. Do these in addition to the moderate-intensity exercise.  Know your numbers.Ask your health care provider to check your cholesterol and your blood glucose. Continue to have your blood tested as directed by your health care provider.  What should I know about cancer screening? There are several types of cancer. Take the following steps to reduce your risk and to catch any cancer development as early as possible. Breast Cancer  Practice breast self-awareness. ? This means understanding how your breasts normally appear and feel. ? It also means doing regular breast self-exams. Let your health care provider know about any changes, no matter how small.  If you are 40  or older, have a clinician do a breast exam (clinical breast exam or CBE) every year. Depending on your age, family history, and medical history, it may be recommended that you also have a yearly breast X-ray (mammogram).  If you  have a family history of breast cancer, talk with your health care provider about genetic screening.  If you are at high risk for breast cancer, talk with your health care provider about having an MRI and a mammogram every year.  Breast cancer (BRCA) gene test is recommended for women who have family members with BRCA-related cancers. Results of the assessment will determine the need for genetic counseling and BRCA1 and for BRCA2 testing. BRCA-related cancers include these types: ? Breast. This occurs in males or females. ? Ovarian. ? Tubal. This may also be called fallopian tube cancer. ? Cancer of the abdominal or pelvic lining (peritoneal cancer). ? Prostate. ? Pancreatic.  Cervical, Uterine, and Ovarian Cancer Your health care provider may recommend that you be screened regularly for cancer of the pelvic organs. These include your ovaries, uterus, and vagina. This screening involves a pelvic exam, which includes checking for microscopic changes to the surface of your cervix (Pap test).  For women ages 21-65, health care providers may recommend a pelvic exam and a Pap test every three years. For women ages 79-65, they may recommend the Pap test and pelvic exam, combined with testing for human papilloma virus (HPV), every five years. Some types of HPV increase your risk of cervical cancer. Testing for HPV may also be done on women of any age who have unclear Pap test results.  Other health care providers may not recommend any screening for nonpregnant women who are considered low risk for pelvic cancer and have no symptoms. Ask your health care provider if a screening pelvic exam is right for you.  If you have had past treatment for cervical cancer or a condition that could lead to cancer, you need Pap tests and screening for cancer for at least 20 years after your treatment. If Pap tests have been discontinued for you, your risk factors (such as having a new sexual partner) need to be  reassessed to determine if you should start having screenings again. Some women have medical problems that increase the chance of getting cervical cancer. In these cases, your health care provider may recommend that you have screening and Pap tests more often.  If you have a family history of uterine cancer or ovarian cancer, talk with your health care provider about genetic screening.  If you have vaginal bleeding after reaching menopause, tell your health care provider.  There are currently no reliable tests available to screen for ovarian cancer.  Lung Cancer Lung cancer screening is recommended for adults 69-62 years old who are at high risk for lung cancer because of a history of smoking. A yearly low-dose CT scan of the lungs is recommended if you:  Currently smoke.  Have a history of at least 30 pack-years of smoking and you currently smoke or have quit within the past 15 years. A pack-year is smoking an average of one pack of cigarettes per day for one year.  Yearly screening should:  Continue until it has been 15 years since you quit.  Stop if you develop a health problem that would prevent you from having lung cancer treatment.  Colorectal Cancer  This type of cancer can be detected and can often be prevented.  Routine colorectal cancer screening usually begins at  age 42 and continues through age 45.  If you have risk factors for colon cancer, your health care provider may recommend that you be screened at an earlier age.  If you have a family history of colorectal cancer, talk with your health care provider about genetic screening.  Your health care provider may also recommend using home test kits to check for hidden blood in your stool.  A small camera at the end of a tube can be used to examine your colon directly (sigmoidoscopy or colonoscopy). This is done to check for the earliest forms of colorectal cancer.  Direct examination of the colon should be repeated every  5-10 years until age 71. However, if early forms of precancerous polyps or small growths are found or if you have a family history or genetic risk for colorectal cancer, you may need to be screened more often.  Skin Cancer  Check your skin from head to toe regularly.  Monitor any moles. Be sure to tell your health care provider: ? About any new moles or changes in moles, especially if there is a change in a mole's shape or color. ? If you have a mole that is larger than the size of a pencil eraser.  If any of your family members has a history of skin cancer, especially at a young age, talk with your health care provider about genetic screening.  Always use sunscreen. Apply sunscreen liberally and repeatedly throughout the day.  Whenever you are outside, protect yourself by wearing long sleeves, pants, a wide-brimmed hat, and sunglasses.  What should I know about osteoporosis? Osteoporosis is a condition in which bone destruction happens more quickly than new bone creation. After menopause, you may be at an increased risk for osteoporosis. To help prevent osteoporosis or the bone fractures that can happen because of osteoporosis, the following is recommended:  If you are 46-71 years old, get at least 1,000 mg of calcium and at least 600 mg of vitamin D per day.  If you are older than age 55 but younger than age 65, get at least 1,200 mg of calcium and at least 600 mg of vitamin D per day.  If you are older than age 54, get at least 1,200 mg of calcium and at least 800 mg of vitamin D per day.  Smoking and excessive alcohol intake increase the risk of osteoporosis. Eat foods that are rich in calcium and vitamin D, and do weight-bearing exercises several times each week as directed by your health care provider. What should I know about how menopause affects my mental health? Depression may occur at any age, but it is more common as you become older. Common symptoms of depression  include:  Low or sad mood.  Changes in sleep patterns.  Changes in appetite or eating patterns.  Feeling an overall lack of motivation or enjoyment of activities that you previously enjoyed.  Frequent crying spells.  Talk with your health care provider if you think that you are experiencing depression. What should I know about immunizations? It is important that you get and maintain your immunizations. These include:  Tetanus, diphtheria, and pertussis (Tdap) booster vaccine.  Influenza every year before the flu season begins.  Pneumonia vaccine.  Shingles vaccine.  Your health care provider may also recommend other immunizations. This information is not intended to replace advice given to you by your health care provider. Make sure you discuss any questions you have with your health care provider. Document Released: 04/03/2005  Document Revised: 08/30/2015 Document Reviewed: 11/13/2014 Elsevier Interactive Patient Education  2018 [employer]  

## 2017-07-01 NOTE — Progress Notes (Signed)
Date:  07/01/2017   Name:  Heather Dudley   DOB:  [birth year]   MRN:  [id number]   Chief Complaint: Annual Exam (had a hysterectomy/ has ovaries. needs mammo) Heather Dudley is a 57 y.o. female who presents today for her Complete Annual Exam. She feels fairly well. She reports exercising 30 minutes per day walking. She reports she is sleeping well.  She is s/p hysterectomy so no longer needs pap smears. She sees Oncology yearly to follow up breast cancer. Oncology orders mammograms.  She stopped AI after 4.5 years due to severe side effects.  Hypertension  This is a chronic problem. The problem is controlled. Associated symptoms include anxiety. Pertinent negatives include no chest pain, headaches, palpitations or shortness of breath. Past treatments include diuretics.  Anxiety  Presents for follow-up visit. Symptoms include nervous/anxious behavior. Patient reports no chest pain, dizziness, palpitations or shortness of breath. Symptoms occur occasionally. The severity of symptoms is moderate.     Leg Cramps and palpitations - now on potassium daily and has had no sx.  Has propranolol to take PRN but has not needed it.  Review of Systems  Constitutional: Negative for chills, fatigue and fever.  HENT: Negative for congestion, hearing loss, tinnitus, trouble swallowing and voice change.   Eyes: Negative for visual disturbance.  Respiratory: Negative for cough, chest tightness, shortness of breath and wheezing.   Cardiovascular: Negative for chest pain, palpitations and leg swelling.  Gastrointestinal: Negative for abdominal pain, constipation, diarrhea and vomiting.  Endocrine: Negative for polydipsia and polyuria.  Genitourinary: Negative for dysuria, frequency, genital sores, vaginal bleeding and vaginal discharge.  Musculoskeletal: Negative for arthralgias, gait problem and joint swelling.  Skin: Negative for color change and rash.  Neurological: Negative for dizziness,  tremors, light-headedness and headaches.  Hematological: Negative for adenopathy. Does not bruise/bleed easily.  Psychiatric/Behavioral: Negative for dysphoric mood and sleep disturbance. The patient is nervous/anxious.     Patient Active Problem List   Diagnosis Date Noted  . Paroxysmal tachycardia ([place]) 05/04/2017  . Liver lesion 03/26/2017  . Hyperlipidemia, mixed 07/02/2016  . Anxiety attack 06/15/2016  . Macroglossia 02/05/2016  . Environmental and seasonal allergies 01/02/2016  . Hot flashes related to aromatase inhibitor therapy 09/08/2015  . Vitamin D deficiency 04/19/2015  . Cervical radiculopathy 04/17/2015  . Hypertension 04/17/2015  . History of partial thyroidectomy 04/17/2015  . Obesity, Class II, BMI 35-39.9 04/17/2015  . Lipoma of axilla 10/24/2014  . History of left breast cancer 09/02/2010    Prior to Admission medications   Medication Sig Start Date End Date Taking? Authorizing Provider  B Complex-Biotin-FA (B-COMPLEX PO) Take 1 tablet by mouth daily.   Yes [provider]  Ergocalciferol (VITAMIN D2) 400 units TABS Take 400 Units by mouth daily as needed.    Yes [provider]  hydrochlorothiazide (HYDRODIURIL) 25 MG tablet TAKE 1 TABLET (25 MG TOTAL) BY MOUTH DAILY. 01/06/17  Yes Glean Hess, MD  LORazepam (ATIVAN) 0.5 MG tablet Take 1 tablet (0.5 mg total) by mouth every 8 (eight) hours as needed for anxiety. 06/15/16  Yes Glean Hess, MD  Multiple Vitamin (MULTIVITAMIN) tablet Take 1 tablet by mouth daily.   Yes [provider]  potassium chloride SA (K-DUR,KLOR-CON) 20 MEQ tablet Take 1 tablet (20 mEq total) by mouth daily. 05/04/17  Yes Gollan, Kathlene November, MD  propranolol (INDERAL) 20 MG tablet Take 1 tablet (20 mg total) by mouth 3 (three) times daily as needed. 05/04/17  Yes Minna Merritts, MD    Allergies  Allergen Reactions  . Ace Inhibitors Swelling  . Penicillins Rash    Has patient had a PCN reaction causing  immediate rash, facial/tongue/throat swelling, SOB or lightheadedness with hypotension: Yes Has patient had a PCN reaction causing severe rash involving mucus membranes or skin necrosis: No Has patient had a PCN reaction that required hospitalization: No Has patient had a PCN reaction occurring within the last 10 years: No If all of the above answers are "NO", then may proceed with Cephalosporin use.    Past Surgical History:  Procedure Laterality Date  . ABDOMINAL HYSTERECTOMY  2011  . birth mark removed    . BREAST CYST ASPIRATION Right    negative 01/2010  . BREAST EXCISIONAL BIOPSY  August 05, 2010   left breast positive 07/2010  . BREAST LUMPECTOMY  2012   left breast  . COLONOSCOPY  July 29, 2012   normal study.  . robotic tonsillectomy Lingual tonsils Bilateral 06/08/2016  . THYROID SURGERY     partially removed  . TUBAL LIGATION      Social History   Tobacco Use  . Smoking status: Never Smoker  . Smokeless tobacco: Never Used  Substance Use Topics  . Alcohol use: No    Alcohol/week: 0.0 oz  . Drug use: No     Medication list has been reviewed and updated.  PHQ 2/9 Scores 03/26/2017 08/21/2015 04/17/2015  PHQ - 2 Score 0 0 0    Physical Exam  Constitutional: She is oriented to person, place, and time. She appears well-developed and well-nourished. No distress.  HENT:  Head: Normocephalic and atraumatic.  Right Ear: Tympanic membrane and ear canal normal.  Left Ear: Tympanic membrane and ear canal normal.  Nose: Right sinus exhibits no maxillary sinus tenderness. Left sinus exhibits no maxillary sinus tenderness.  Mouth/Throat: Uvula is midline and oropharynx is clear and moist.  Eyes: Conjunctivae and EOM are normal. Right eye exhibits no discharge. Left eye exhibits no discharge. No scleral icterus.  Neck: Normal range of motion. Carotid bruit is not present. No erythema present. No thyromegaly present.  Cardiovascular: Normal rate, regular rhythm, normal heart  sounds and normal pulses.  Pulmonary/Chest: Effort normal. No respiratory distress. She has no wheezes. Right breast exhibits no mass, no nipple discharge, no skin change and no tenderness. Left breast exhibits skin change. Left breast exhibits no mass, no nipple discharge and no tenderness. Breasts are asymmetrical.  Post surgical and XRT changes of lateral left breast  Abdominal: Soft. Bowel sounds are normal. There is no hepatosplenomegaly. There is no tenderness. There is no CVA tenderness.  Musculoskeletal: Normal range of motion.  Lymphadenopathy:    She has no cervical adenopathy.    She has no axillary adenopathy.  Neurological: She is alert and oriented to person, place, and time. She has normal reflexes. No cranial nerve deficit or sensory deficit.  Skin: Skin is warm, dry and intact. No rash noted.  Psychiatric: She has a normal mood and affect. Her speech is normal and behavior is normal. Thought content normal.  Nursing note and vitals reviewed.   BP 128/80   Pulse 84   Ht 5\' 7"  (1.702 m)   Wt 232 lb (105.2 kg)   LMP 04/01/2008 Comment: Hysterectomy was due to endometriosis. - still has ovaries.   BMI 36.34 kg/m   Assessment and Plan: 1. Annual physical exam Continue regular exercise, healthy diet - POCT urinalysis dipstick  2. Essential  hypertension controlled - CBC with Differential/Platelet - Comprehensive metabolic panel  3. Paroxysmal tachycardia ([facility]) asx - has beta blocker if needed  4. History of left breast cancer Followed by oncology - now off AI Mammogram scheduled  5. Hyperlipidemia, mixed - Lipid panel  6. History of partial thyroidectomy - TSH  7. Anxiety attack #20 lorazepam given   Meds ordered this encounter  Medications  . LORazepam (ATIVAN) 0.5 MG tablet    Sig: Take 1 tablet (0.5 mg total) by mouth every 8 (eight) hours as needed for anxiety.    Dispense:  20 tablet    Refill:  0    Partially dictated using Editor, commissioning. Any  errors are unintentional.  Halina Maidens, MD [facility]  07/01/2017

## 2017-07-02 LAB — COMPREHENSIVE METABOLIC PANEL
A/G RATIO: 1.8 (ref 1.2–2.2)
ALK PHOS: 80 IU/L (ref 39–117)
ALT: 12 IU/L (ref 0–32)
AST: 16 IU/L (ref 0–40)
Albumin: 4.5 g/dL (ref 3.5–5.5)
BILIRUBIN TOTAL: 0.4 mg/dL (ref 0.0–1.2)
BUN/Creatinine Ratio: 13 (ref 9–23)
BUN: 14 mg/dL (ref 6–24)
CHLORIDE: 100 mmol/L (ref 96–106)
CO2: 23 mmol/L (ref 20–29)
Calcium: 9.7 mg/dL (ref 8.7–10.2)
Creatinine, Ser: 1.09 mg/dL — ABNORMAL HIGH (ref 0.57–1.00)
GFR calc Af Amer: 66 mL/min/{1.73_m2} (ref >59)
GFR calc non Af Amer: 57 mL/min/{1.73_m2} — ABNORMAL LOW (ref >59)
GLUCOSE: 92 mg/dL (ref 65–99)
Globulin, Total: 2.5 g/dL (ref 1.5–4.5)
POTASSIUM: 4 mmol/L (ref 3.5–5.2)
Sodium: 138 mmol/L (ref 134–144)
Total Protein: 7 g/dL (ref 6.0–8.5)

## 2017-07-02 LAB — LIPID PANEL
CHOLESTEROL TOTAL: 261 mg/dL — AB (ref 100–199)
Chol/HDL Ratio: 5.7 ratio — ABNORMAL HIGH (ref 0.0–4.4)
HDL: 46 mg/dL (ref >39)
LDL Calculated: 197 mg/dL — ABNORMAL HIGH (ref 0–99)
Triglycerides: 91 mg/dL (ref 0–149)
VLDL CHOLESTEROL CAL: 18 mg/dL (ref 5–40)

## 2017-07-02 LAB — CBC WITH DIFFERENTIAL/PLATELET
BASOS ABS: 0 10*3/uL (ref 0.0–0.2)
Basos: 0 %
EOS (ABSOLUTE): 0.2 10*3/uL (ref 0.0–0.4)
Eos: 2 %
Hematocrit: 40.2 % (ref 34.0–46.6)
Hemoglobin: 13.6 g/dL (ref 11.1–15.9)
IMMATURE GRANULOCYTES: 0 %
Immature Grans (Abs): 0 10*3/uL (ref 0.0–0.1)
LYMPHS ABS: 3.3 10*3/uL — AB (ref 0.7–3.1)
Lymphs: 38 %
MCH: 28.5 pg (ref 26.6–33.0)
MCHC: 33.8 g/dL (ref 31.5–35.7)
MCV: 84 fL (ref 79–97)
MONOCYTES: 5 %
MONOS ABS: 0.5 10*3/uL (ref 0.1–0.9)
NEUTROS PCT: 55 %
Neutrophils Absolute: 4.8 10*3/uL (ref 1.4–7.0)
Platelets: 335 10*3/uL (ref 150–379)
RBC: 4.78 x10E6/uL (ref 3.77–5.28)
RDW: 14.7 % (ref 12.3–15.4)
WBC: 8.8 10*3/uL (ref 3.4–10.8)

## 2017-07-02 LAB — TSH: TSH: 2.49 u[IU]/mL (ref 0.450–4.500)

## 2017-07-20 ENCOUNTER — Ambulatory Visit
Admission: RE | Admit: 2017-07-20 | Discharge: 2017-07-20 | Disposition: A | Discharge status: Home / Self Care | Payer: 59 | Source: Ambulatory Visit | Attending: Urgent Care | Admitting: Urgent Care

## 2017-07-20 DIAGNOSIS — Z170 Estrogen receptor positive status [ER+]: Secondary | ICD-10-CM | POA: Insufficient documentation

## 2017-07-20 DIAGNOSIS — C50912 Malignant neoplasm of unspecified site of left female breast: Secondary | ICD-10-CM | POA: Diagnosis not present

## 2017-07-20 DIAGNOSIS — R928 Other abnormal and inconclusive findings on diagnostic imaging of breast: Secondary | ICD-10-CM | POA: Diagnosis not present

## 2017-07-20 HISTORY — DX: Personal history of irradiation: Z92.3

## 2017-08-28 DIAGNOSIS — J069 Acute upper respiratory infection, unspecified: Secondary | ICD-10-CM | POA: Diagnosis not present

## 2017-09-02 DIAGNOSIS — M9901 Segmental and somatic dysfunction of cervical region: Secondary | ICD-10-CM | POA: Diagnosis not present

## 2017-09-02 DIAGNOSIS — M5413 Radiculopathy, cervicothoracic region: Secondary | ICD-10-CM | POA: Diagnosis not present

## 2017-09-02 DIAGNOSIS — M62838 Other muscle spasm: Secondary | ICD-10-CM | POA: Diagnosis not present

## 2017-09-06 DIAGNOSIS — M5413 Radiculopathy, cervicothoracic region: Secondary | ICD-10-CM | POA: Diagnosis not present

## 2017-09-06 DIAGNOSIS — M9901 Segmental and somatic dysfunction of cervical region: Secondary | ICD-10-CM | POA: Diagnosis not present

## 2017-09-06 DIAGNOSIS — M62838 Other muscle spasm: Secondary | ICD-10-CM | POA: Diagnosis not present

## 2017-09-09 DIAGNOSIS — M9901 Segmental and somatic dysfunction of cervical region: Secondary | ICD-10-CM | POA: Diagnosis not present

## 2017-09-09 DIAGNOSIS — M5413 Radiculopathy, cervicothoracic region: Secondary | ICD-10-CM | POA: Diagnosis not present

## 2017-09-09 DIAGNOSIS — M62838 Other muscle spasm: Secondary | ICD-10-CM | POA: Diagnosis not present

## 2017-09-16 DIAGNOSIS — M9901 Segmental and somatic dysfunction of cervical region: Secondary | ICD-10-CM | POA: Diagnosis not present

## 2017-09-16 DIAGNOSIS — M5413 Radiculopathy, cervicothoracic region: Secondary | ICD-10-CM | POA: Diagnosis not present

## 2017-09-16 DIAGNOSIS — M62838 Other muscle spasm: Secondary | ICD-10-CM | POA: Diagnosis not present

## 2017-10-03 ENCOUNTER — Other Ambulatory Visit: Payer: Self-pay | Admitting: Internal Medicine

## 2017-10-03 DIAGNOSIS — I10 Essential (primary) hypertension: Secondary | ICD-10-CM

## 2018-02-14 ENCOUNTER — Ambulatory Visit (INDEPENDENT_AMBULATORY_CARE_PROVIDER_SITE_OTHER): Payer: 59 | Admitting: Internal Medicine

## 2018-02-14 ENCOUNTER — Encounter: Payer: Self-pay | Admitting: Internal Medicine

## 2018-02-14 VITALS — BP 118/78 | HR 98 | Ht 67.0 in | Wt 236.0 lb

## 2018-02-14 DIAGNOSIS — I10 Essential (primary) hypertension: Secondary | ICD-10-CM | POA: Diagnosis not present

## 2018-02-14 DIAGNOSIS — F410 Panic disorder [episodic paroxysmal anxiety] without agoraphobia: Secondary | ICD-10-CM | POA: Diagnosis not present

## 2018-02-14 MED ORDER — LORAZEPAM 0.5 MG PO TABS: 0.5000 mg | ORAL_TABLET | Freq: Three times a day (TID) | ORAL | 0 refills | Status: DC | PRN

## 2018-02-14 NOTE — Progress Notes (Signed)
Date:  02/14/2018   Name:  Heather Dudley   DOB:  1960-06-25   MRN:  [id number]   Chief Complaint: Hypertension (Started this past weekend. Wasn't feeling well so took BP and it was 200/103. )  Hypertension  This is a chronic problem. Progression since onset: much higher over the weekend. Associated symptoms include anxiety and palpitations. Pertinent negatives include no chest pain, headaches or shortness of breath. There are no known risk factors for coronary artery disease. Past treatments include diuretics. The current treatment provides significant improvement. There are no compliance problems.   Anxiety  Presents for follow-up visit. Symptoms include nervous/anxious behavior (intermittent anxiety attacks) and palpitations. Patient reports no chest pain, dizziness or shortness of breath. Symptoms occur occasionally. The severity of symptoms is moderate.   Compliance with medications: using low dose ativan PRN - #20 tabs lasted 6 months.  She did not feel well over the weekend but no specific sx.  She has not been taking her daily potassium recommended by Cardiology.  Review of Systems  Constitutional: Positive for fatigue. Negative for chills, fever and unexpected weight change.  HENT: Negative for ear pain, sinus pressure, sore throat and trouble swallowing.   Eyes: Negative for visual disturbance.  Respiratory: Negative for cough, chest tightness, shortness of breath and wheezing.   Cardiovascular: Positive for palpitations. Negative for chest pain and leg swelling.  Skin: Negative for color change and rash.  Allergic/Immunologic: Negative for environmental allergies.  Neurological: Negative for dizziness, light-headedness and headaches.  Psychiatric/Behavioral: Negative for sleep disturbance. The patient is nervous/anxious (intermittent anxiety attacks).     Patient Active Problem List   Diagnosis Date Noted  . Paroxysmal tachycardia ([place]) 05/04/2017  . Liver lesion  03/26/2017  . Hyperlipidemia, mixed 07/02/2016  . Anxiety attack 06/15/2016  . Macroglossia 02/05/2016  . Environmental and seasonal allergies 01/02/2016  . Hot flashes related to aromatase inhibitor therapy 09/08/2015  . Vitamin D deficiency 04/19/2015  . Cervical radiculopathy 04/17/2015  . Essential hypertension 04/17/2015  . History of partial thyroidectomy 04/17/2015  . Obesity, Class II, BMI 35-39.9 04/17/2015  . Lipoma of axilla 10/24/2014  . History of left breast cancer 09/02/2010    Allergies  Allergen Reactions  . Ace Inhibitors Swelling  . Sernivo [Betamethasone Dipropionate] Itching  . Penicillins Rash    Has patient had a PCN reaction causing immediate rash, facial/tongue/throat swelling, SOB or lightheadedness with hypotension: Yes Has patient had a PCN reaction causing severe rash involving mucus membranes or skin necrosis: No Has patient had a PCN reaction that required hospitalization: No Has patient had a PCN reaction occurring within the last 10 years: No If all of the above answers are "NO", then may proceed with Cephalosporin use.    Past Surgical History:  Procedure Laterality Date  . ABDOMINAL HYSTERECTOMY  2011  . birth mark removed    . BREAST CYST ASPIRATION Right    negative 01/2010  . BREAST EXCISIONAL BIOPSY  August 05, 2010   left breast positive 07/2010  . BREAST LUMPECTOMY  2012   left breast  . COLONOSCOPY  July 29, 2012   normal study.  . robotic tonsillectomy Lingual tonsils Bilateral 06/08/2016  . THYROID SURGERY     partially removed  . TUBAL LIGATION      Social History   Tobacco Use  . Smoking status: Never Smoker  . Smokeless tobacco: Never Used  Substance Use Topics  . Alcohol use: No    Alcohol/week: 0.0 standard  drinks  . Drug use: No     Medication list has been reviewed and updated.  Current Meds  Medication Sig  . B Complex-Biotin-FA (B-COMPLEX PO) Take 1 tablet by mouth daily.  . Ergocalciferol (VITAMIN D2) 400  units TABS Take 400 Units by mouth daily as needed.   . hydrochlorothiazide (HYDRODIURIL) 25 MG tablet TAKE 1 TABLET BY MOUTH EVERY DAY (Patient taking differently: Take 25 mg by mouth 2 (two) times daily. )  . LORazepam (ATIVAN) 0.5 MG tablet Take 1 tablet (0.5 mg total) by mouth every 8 (eight) hours as needed for anxiety.  . Multiple Vitamin (MULTIVITAMIN) tablet Take 1 tablet by mouth daily.  . potassium chloride SA (K-DUR,KLOR-CON) 20 MEQ tablet Take 1 tablet (20 mEq total) by mouth daily.  . propranolol (INDERAL) 20 MG tablet Take 1 tablet (20 mg total) by mouth 3 (three) times daily as needed.    PHQ 2/9 Scores 03/26/2017 08/21/2015 04/17/2015  PHQ - 2 Score 0 0 0    Physical Exam Vitals signs and nursing note reviewed.  Constitutional:      General: She is not in acute distress.    Appearance: She is well-developed.  HENT:     Head: Normocephalic and atraumatic.  Eyes:     Pupils: Pupils are equal, round, and reactive to light.  Neck:     Musculoskeletal: Normal range of motion and neck supple.     Vascular: No carotid bruit.  Cardiovascular:     Rate and Rhythm: Normal rate and regular rhythm.     Pulses: Normal pulses.  Pulmonary:     Effort: Pulmonary effort is normal. No respiratory distress.     Breath sounds: Normal breath sounds.  Lymphadenopathy:     Cervical: No cervical adenopathy.  Skin:    General: Skin is warm and dry.     Findings: No rash.  Neurological:     Mental Status: She is alert and oriented to person, place, and time.  Psychiatric:        Behavior: Behavior normal.        Thought Content: Thought content normal.     BP 136/80 (BP Location: Right Arm, Patient Position: Sitting, Cuff Size: Normal)   Pulse 98   Ht 5\' 7"  (1.702 m)   Wt 236 lb (107 kg)   LMP 04/01/2008 Comment: Hysterectomy was due to endometriosis. - still has ovaries.   SpO2 99%   BMI 36.96 kg/m  Repeat BP 118/78  Assessment and Plan: 1. Essential hypertension Controlled  by exam today Would decrease to one diuretic per day and take potassium daily Monitor BP at home and call if persistently >140/95  2. Anxiety attack intermittent sx relieved by low dose ativan PRN - LORazepam (ATIVAN) 0.5 MG tablet; Take 1 tablet (0.5 mg total) by mouth every 8 (eight) hours as needed for anxiety.  Dispense: 20 tablet; Refill: 0   Partially dictated using Editor, commissioning. Any errors are unintentional.  Halina Maidens, MD [facility]  02/14/2018

## 2018-02-14 NOTE — Patient Instructions (Addendum)
Monitor blood pressure several times a week.  Avoid decongestants (sudafed) but can take allegra or claritin (plain, no "D")  Take one diuretic and one potassium daily.  If BP if > 140/95, then call or make an appointment.  Bring BP cuff to next appointment.

## 2018-02-17 ENCOUNTER — Ambulatory Visit: Payer: 59 | Admitting: Internal Medicine

## 2018-04-08 ENCOUNTER — Encounter: Payer: Self-pay | Admitting: Internal Medicine

## 2018-04-08 ENCOUNTER — Ambulatory Visit (INDEPENDENT_AMBULATORY_CARE_PROVIDER_SITE_OTHER): Payer: 59 | Admitting: Internal Medicine

## 2018-04-08 ENCOUNTER — Other Ambulatory Visit: Payer: Self-pay

## 2018-04-08 VITALS — BP 124/82 | HR 76 | Temp 98.1°F | Ht 67.0 in | Wt 237.0 lb

## 2018-04-08 DIAGNOSIS — R1030 Lower abdominal pain, unspecified: Secondary | ICD-10-CM

## 2018-04-08 DIAGNOSIS — I10 Essential (primary) hypertension: Secondary | ICD-10-CM | POA: Diagnosis not present

## 2018-04-08 DIAGNOSIS — N3001 Acute cystitis with hematuria: Secondary | ICD-10-CM

## 2018-04-08 LAB — POCT URINALYSIS DIPSTICK
BILIRUBIN UA: NEGATIVE
GLUCOSE UA: NEGATIVE
Ketones, UA: NEGATIVE
LEUKOCYTES UA: NEGATIVE
NITRITE UA: NEGATIVE
PH UA: 5 (ref 5.0–8.0)
Protein, UA: NEGATIVE
Spec Grav, UA: 1.015 (ref 1.010–1.025)
Urobilinogen, UA: 0.2 E.U./dL

## 2018-04-08 MED ORDER — NITROFURANTOIN MONOHYD MACRO 100 MG PO CAPS: 100.0000 mg | ORAL_CAPSULE | Freq: Two times a day (BID) | ORAL | 0 refills | Status: AC

## 2018-04-08 MED ORDER — AMLODIPINE BESYLATE 5 MG PO TABS: 5.0000 mg | ORAL_TABLET | Freq: Every day | ORAL | 1 refills | Status: DC

## 2018-04-08 NOTE — Progress Notes (Signed)
Date:  04/08/2018   Name:  Heather Dudley   DOB:  [birth year]   MRN:  [id number]   Chief Complaint: Hypertension (Noticed she was having a lot of pressure in her eyes last week and she checked her BP. Noticed Bp was high. Spikes at night. ) BP at home up to 160/90. Comparison to our cuff - her home cuff is about 5 points higher. Hypertension  This is a chronic problem. The problem is controlled. Associated symptoms include headaches. Pertinent negatives include no chest pain, palpitations or shortness of breath. Past treatments include diuretics and beta blockers. There are no compliance problems.   Abdominal Pain  This is a new problem. The current episode started 1 to 4 weeks ago. The problem occurs daily. The problem has been unchanged. The pain is located in the suprapubic region. The pain is mild. The quality of the pain is a sensation of fullness. Associated symptoms include arthralgias (mild left arm pain from lymphedema), constipation and headaches. Pertinent negatives include no diarrhea, dysuria, hematuria or nausea. The pain is relieved by nothing. She has tried nothing (chiropractic care) for the symptoms.    Review of Systems  Constitutional: Negative for fatigue and unexpected weight change.  HENT: Negative for trouble swallowing.   Eyes: Negative for visual disturbance.  Respiratory: Negative for cough, chest tightness and shortness of breath.   Cardiovascular: Negative for chest pain, palpitations and leg swelling.  Gastrointestinal: Positive for abdominal pain and constipation. Negative for blood in stool, diarrhea and nausea.  Genitourinary: Negative for difficulty urinating, dysuria, hematuria, vaginal bleeding and vaginal pain.  Musculoskeletal: Positive for arthralgias (mild left arm pain from lymphedema).  Neurological: Positive for headaches. Negative for dizziness, tremors, speech difficulty, weakness and numbness.  Psychiatric/Behavioral: Negative for  dysphoric mood and sleep disturbance.    Patient Active Problem List   Diagnosis Date Noted  . Paroxysmal tachycardia ([place]) 05/04/2017  . Liver lesion 03/26/2017  . Hyperlipidemia, mixed 07/02/2016  . Anxiety attack 06/15/2016  . Macroglossia 02/05/2016  . Environmental and seasonal allergies 01/02/2016  . Hot flashes related to aromatase inhibitor therapy 09/08/2015  . Vitamin D deficiency 04/19/2015  . Cervical radiculopathy 04/17/2015  . Essential hypertension 04/17/2015  . History of partial thyroidectomy 04/17/2015  . Obesity, Class II, BMI 35-39.9 04/17/2015  . Lipoma of axilla 10/24/2014  . History of left breast cancer 09/02/2010    Allergies  Allergen Reactions  . Ace Inhibitors Swelling  . Sernivo [Betamethasone Dipropionate] Itching  . Penicillins Rash    Has patient had a PCN reaction causing immediate rash, facial/tongue/throat swelling, SOB or lightheadedness with hypotension: Yes Has patient had a PCN reaction causing severe rash involving mucus membranes or skin necrosis: No Has patient had a PCN reaction that required hospitalization: No Has patient had a PCN reaction occurring within the last 10 years: No If all of the above answers are "NO", then may proceed with Cephalosporin use.    Past Surgical History:  Procedure Laterality Date  . ABDOMINAL HYSTERECTOMY  2011  . birth mark removed    . BREAST CYST ASPIRATION Right    negative 01/2010  . BREAST EXCISIONAL BIOPSY  August 05, 2010   left breast positive 07/2010  . BREAST LUMPECTOMY  2012   left breast  . COLONOSCOPY  July 29, 2012   normal study.  . robotic tonsillectomy Lingual tonsils Bilateral 06/08/2016  . THYROID SURGERY     partially removed  . TUBAL LIGATION  Social History   Tobacco Use  . Smoking status: Never Smoker  . Smokeless tobacco: Never Used  Substance Use Topics  . Alcohol use: No    Alcohol/week: 0.0 standard drinks  . Drug use: No     Medication list has been  reviewed and updated.  Current Meds  Medication Sig  . B Complex-Biotin-FA (B-COMPLEX PO) Take 1 tablet by mouth daily.  . Ergocalciferol (VITAMIN D2) 400 units TABS Take 400 Units by mouth daily as needed.   . hydrochlorothiazide (HYDRODIURIL) 25 MG tablet TAKE 1 TABLET BY MOUTH EVERY DAY (Patient taking differently: Take 25 mg by mouth daily. )  . LORazepam (ATIVAN) 0.5 MG tablet Take 1 tablet (0.5 mg total) by mouth every 8 (eight) hours as needed for anxiety.  . Multiple Vitamin (MULTIVITAMIN) tablet Take 1 tablet by mouth daily.  . potassium chloride SA (K-DUR,KLOR-CON) 20 MEQ tablet Take 1 tablet (20 mEq total) by mouth daily.  . propranolol (INDERAL) 20 MG tablet Take 1 tablet (20 mg total) by mouth 3 (three) times daily as needed.    PHQ 2/9 Scores 04/08/2018 03/26/2017 08/21/2015 04/17/2015  PHQ - 2 Score 0 0 0 0   Wt Readings from Last 3 Encounters:  04/08/18 237 lb (107.5 kg)  02/14/18 236 lb (107 kg)  07/01/17 232 lb (105.2 kg)    Physical Exam Vitals signs and nursing note reviewed.  Constitutional:      General: She is not in acute distress.    Appearance: She is well-developed.  HENT:     Head: Normocephalic and atraumatic.  Neck:     Musculoskeletal: Normal range of motion and neck supple.  Cardiovascular:     Rate and Rhythm: Normal rate and regular rhythm.  No extrasystoles are present.    Pulses: Normal pulses.     Heart sounds: Normal heart sounds.  Pulmonary:     Effort: Pulmonary effort is normal. No respiratory distress.  Abdominal:     General: Bowel sounds are normal.     Palpations: Abdomen is soft.     Tenderness: There is abdominal tenderness in the suprapubic area. There is no guarding or rebound.     Hernia: No hernia is present.  Musculoskeletal: Normal range of motion.        General: No swelling, tenderness, deformity or signs of injury.     Right hip: Normal.     Left hip: Normal.     Right lower leg: No edema.     Left lower leg: No edema.      Comments: SLR negative bilaterally  Skin:    General: Skin is warm and dry.     Findings: No rash.  Neurological:     Mental Status: She is alert and oriented to person, place, and time.  Psychiatric:        Behavior: Behavior normal.        Thought Content: Thought content normal.     BP 124/82   Pulse 76   Temp 98.1 F (36.7 C) (Oral)   Ht 5\' 7"  (1.702 m)   Wt 237 lb (107.5 kg)   LMP 04/01/2008 Comment: Hysterectomy was due to endometriosis. - still has ovaries.   SpO2 98%   BMI 37.12 kg/m   Assessment and Plan: 1. Essential hypertension Add amlodipine to HCTZ Monitor at home - return if not improving - amLODipine (NORVASC) 5 MG tablet; Take 1 tablet (5 mg total) by mouth daily.  Dispense: 90 tablet; Refill: 1  2. Lower abdominal pain - POCT urinalysis dipstick  3. Acute cystitis with hematuria - nitrofurantoin, macrocrystal-monohydrate, (MACROBID) 100 MG capsule; Take 1 capsule (100 mg total) by mouth 2 (two) times daily for 7 days.  Dispense: 14 capsule; Refill: 0   Partially dictated using Editor, commissioning. Any errors are unintentional.  Halina Maidens, MD [facility]  04/08/2018

## 2018-04-27 ENCOUNTER — Inpatient Hospital Stay: Payer: 59 | Attending: Urgent Care

## 2018-04-27 DIAGNOSIS — R3129 Other microscopic hematuria: Secondary | ICD-10-CM | POA: Insufficient documentation

## 2018-04-27 DIAGNOSIS — C50912 Malignant neoplasm of unspecified site of left female breast: Secondary | ICD-10-CM

## 2018-04-27 DIAGNOSIS — Z170 Estrogen receptor positive status [ER+]: Secondary | ICD-10-CM

## 2018-04-27 DIAGNOSIS — Z853 Personal history of malignant neoplasm of breast: Secondary | ICD-10-CM | POA: Diagnosis not present

## 2018-04-27 LAB — COMPREHENSIVE METABOLIC PANEL
ALT: 13 U/L (ref 0–44)
AST: 15 U/L (ref 15–41)
Albumin: 4 g/dL (ref 3.5–5.0)
Alkaline Phosphatase: 73 U/L (ref 38–126)
Anion gap: 10 (ref 5–15)
BUN: 16 mg/dL (ref 6–20)
CO2: 29 mmol/L (ref 22–32)
Calcium: 8.9 mg/dL (ref 8.9–10.3)
Chloride: 98 mmol/L (ref 98–111)
Creatinine, Ser: 1.03 mg/dL — ABNORMAL HIGH (ref 0.44–1.00)
GFR calc Af Amer: >60 mL/min (ref >60)
GFR calc non Af Amer: >60 mL/min (ref >60)
Glucose, Bld: 102 mg/dL — ABNORMAL HIGH (ref 70–99)
Potassium: 3.4 mmol/L — ABNORMAL LOW (ref 3.5–5.1)
Sodium: 137 mmol/L (ref 135–145)
Total Bilirubin: 0.3 mg/dL (ref 0.3–1.2)
Total Protein: 7.6 g/dL (ref 6.5–8.1)

## 2018-04-27 LAB — CBC WITH DIFFERENTIAL/PLATELET
Abs Immature Granulocytes: 0.03 10*3/uL (ref 0.00–0.07)
Basophils Absolute: 0.1 10*3/uL (ref 0.0–0.1)
Basophils Relative: 1 %
Eosinophils Absolute: 0.2 10*3/uL (ref 0.0–0.5)
Eosinophils Relative: 2 %
HCT: 38.9 % (ref 36.0–46.0)
Hemoglobin: 13 g/dL (ref 12.0–15.0)
Immature Granulocytes: 0 %
Lymphocytes Relative: 30 %
Lymphs Abs: 3.5 10*3/uL (ref 0.7–4.0)
MCH: 29.5 pg (ref 26.0–34.0)
MCHC: 33.4 g/dL (ref 30.0–36.0)
MCV: 88.4 fL (ref 80.0–100.0)
Monocytes Absolute: 0.7 10*3/uL (ref 0.1–1.0)
Monocytes Relative: 6 %
Neutro Abs: 7 10*3/uL (ref 1.7–7.7)
Neutrophils Relative %: 61 %
Platelets: 310 10*3/uL (ref 150–400)
RBC: 4.4 MIL/uL (ref 3.87–5.11)
RDW: 13.5 % (ref 11.5–15.5)
WBC: 11.4 10*3/uL — ABNORMAL HIGH (ref 4.0–10.5)
nRBC: 0 % (ref 0.0–0.2)

## 2018-04-28 LAB — CA 27.29 (SERIAL MONITOR): CA 27.29: 17.6 U/mL (ref 0.0–38.6)

## 2018-05-04 ENCOUNTER — Inpatient Hospital Stay (HOSPITAL_BASED_OUTPATIENT_CLINIC_OR_DEPARTMENT_OTHER): Payer: 59 | Admitting: Hematology and Oncology

## 2018-05-04 ENCOUNTER — Encounter: Payer: Self-pay | Admitting: Hematology and Oncology

## 2018-05-04 ENCOUNTER — Other Ambulatory Visit: Payer: Self-pay

## 2018-05-04 VITALS — BP 146/74 | HR 89 | Temp 99.3°F | Resp 18 | Ht 67.0 in | Wt 239.5 lb

## 2018-05-04 DIAGNOSIS — Z853 Personal history of malignant neoplasm of breast: Secondary | ICD-10-CM | POA: Diagnosis not present

## 2018-05-04 DIAGNOSIS — R3129 Other microscopic hematuria: Secondary | ICD-10-CM | POA: Diagnosis not present

## 2018-05-04 NOTE — Progress Notes (Signed)
[facility] day:  05/04/2018   Chief Complaint: Heather Dudley is a 59 y.o. female with stage IIA left breast cancer who is seen for 1 year assessment.  HPI:  The patient was last seen in the medical oncology clinic on 04/28/2017.  At that time,  she felt "pretty good".  Exam revealed significant scar tissue in her LEFT breast with questionable nodularity below the incision.  Imaging confirmed scar tissue.    Bilateral diagnostic mammogram on 07/20/2017 revealed no mammographic evidence of breast malignancy.  During the interim, she has done well.  She denies any breast concerns.    She notes a UTI a few weeks ago for which she was prescribed Macrobid.  She presented with left sided back pain.  She notes that she still feels "a little off", like a "dullache".  UA on 04/08/2018 revealed negative leukocytes, nitrites, but large RBCs.  No culture performed.  Urine similar to 07/01/2017 with no leukocytes, nitrites, but large RBCs.  She denies any hematuria.  She has no history of nephrolithiasis.   Past Medical History:  Diagnosis Date  . Basal cell carcinoma of forehead    birthmark of forehead  . Breast screening, unspecified   . Cellulitis and abscess of trunk   . Hernia   . History of chemotherapy 2012  . Hypertension   . Lump or mass in breast   . Malignant neoplasm of breast (female), unspecified site    She underwent wide excision, mastoplasty and axillary dissection for her left breast malignancy on September 03, 2010. The primary tumor was 1 cm in diameter, and a single positive axillary node 1.3 cm in diameter. This was an ER-positive, PR slightly positive, HER-2/neu not overexpressing tumor. She tolerated her whole breast radiation without difficulty ending in late February 2013.  . Malignant neoplasm of upper-outer quadrant of female breast ([place])   . 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 Obesity,  unspecified   . Personal history of malignant neoplasm of breast    The patient underwent wide excision, mastoplasty. Dissection for a T1b, N1, M0 carcinoma left breast on September 03, 2010.The primary tumor was 1 cm in diameter, and a single positive axillary node 1.3 cm in diameter. This was an ER-positive, PR slightly positive, HER-2/neu not overexpressing tumor.  The patient completed whole breast radiation in February 2013.  [name] Personal history of radiation therapy 2013   LEFT lumpectomy  . Screening for obesity   . Thyroid nodule     Past Surgical History:  Procedure Laterality Date  . ABDOMINAL HYSTERECTOMY  2011  . birth mark removed    . BREAST CYST ASPIRATION Right    negative 01/2010  . BREAST EXCISIONAL BIOPSY  August 05, 2010   left breast positive 07/2010  . BREAST LUMPECTOMY  2012   left breast  . COLONOSCOPY  July 29, 2012   normal study.  . robotic tonsillectomy Lingual tonsils Bilateral 06/08/2016  . THYROID SURGERY     partially removed  . TUBAL LIGATION      Family History  Problem Relation Age of Onset  . Colon cancer Other   . Ovarian cancer Other   . Cancer Mother        lung ca  . Cancer Maternal Uncle        lung ca  . Cancer Paternal Aunt        ovarian ca  . Cancer  Cousin        female ca  . Heart failure Father   . Breast cancer Neg Hx     Social History:  reports that she has never smoked. She has never used smokeless tobacco. She reports that she does not drink alcohol or use drugs.  She has 3 sons (ages 43, 75, and 51).  One son lives at home and is a Research scientist (medical).  She is from [place].  She lives in [place].  The patient is alone today.  Allergies:  Allergies  Allergen Reactions  . Ace Inhibitors Swelling  . Sernivo [Betamethasone Dipropionate] Itching  . Penicillins Rash    Has patient had a PCN reaction causing immediate rash, facial/tongue/throat swelling, SOB or lightheadedness with hypotension: Yes Has patient had a PCN reaction  causing severe rash involving mucus membranes or skin necrosis: No Has patient had a PCN reaction that required hospitalization: No Has patient had a PCN reaction occurring within the last 10 years: No If all of the above answers are "NO", then may proceed with Cephalosporin use.    Current Medications: Current Outpatient Medications  Medication Sig Dispense Refill  . amLODipine (NORVASC) 5 MG tablet Take 1 tablet (5 mg total) by mouth daily. 90 tablet 1  . B Complex-Biotin-FA (B-COMPLEX PO) Take 1 tablet by mouth daily.    . Ergocalciferol (VITAMIN D2) 400 units TABS Take 400 Units by mouth daily as needed.     . hydrochlorothiazide (HYDRODIURIL) 25 MG tablet TAKE 1 TABLET BY MOUTH EVERY DAY (Patient taking differently: Take 25 mg by mouth daily. ) 90 tablet 3  . Multiple Vitamin (MULTIVITAMIN) tablet Take 1 tablet by mouth daily.    . potassium chloride SA (K-DUR,KLOR-CON) 20 MEQ tablet Take 1 tablet (20 mEq total) by mouth daily. 90 tablet 3  . LORazepam (ATIVAN) 0.5 MG tablet Take 1 tablet (0.5 mg total) by mouth every 8 (eight) hours as needed for anxiety. (Patient not taking: Reported on 05/04/2018) 20 tablet 0  . propranolol (INDERAL) 20 MG tablet Take 1 tablet (20 mg total) by mouth 3 (three) times daily as needed. (Patient not taking: Reported on 05/04/2018) 90 tablet 3   No current facility-administered medications for this visit.     Review of Systems:  GENERAL:  Feels "a little off". No fevers, sweats.  Weight up 3 pounds. PERFORMANCE STATUS (ECOG):  0 HEENT:  No visual changes, runny nose, sore throat, mouth sores or tenderness. Lungs: No shortness of breath or cough.  No hemoptysis. Cardiac:  No chest pain, palpitations, orthopnea, or PND. GI:  No nausea, vomiting, diarrhea, constipation, melena or hematochezia. GU:  Left flank discomfort.  Microscopic hematuria.  No urgency, frequency, dysuria, or gross hematuria. Musculoskeletal:  No back pain.  No joint pain.  No muscle  tenderness. Extremities:  No pain or swelling. Skin:  No rashes or skin changes. Neuro:  No headache, numbness or weakness, balance or coordination issues. Endocrine:  s/p partial thyroidectomy.  No diabetes, hot flashes or night sweats. Psych:  No mood changes, depression or anxiety. Pain:  No focal pain. Review of systems:  All other systems reviewed and found to be negative.   Physical Exam: Blood pressure (!) 146/74, pulse 89, temperature 99.3 F (37.4 C), resp. rate 18, height _0  (1.702 m), weight 239 lb 8.5 oz (108.7 kg), last menstrual period 04/01/2008, SpO2 100 %. GENERAL:  Well developed, well nourished, woman sitting comfortably in the exam room in no acute distress. MENTAL  STATUS:  Alert and oriented to person, place and time. HEAD:  Short styled brown hair.  Normocephalic, atraumatic, face symmetric, no Cushingoid features. EYES:  Brown eyes.  Pupils equal round and reactive to light and accomodation.  No conjunctivitis or scleral icterus. ENT:  Oropharynx clear without lesion.  Tongue normal. Mucous membranes moist.  RESPIRATORY:  Clear to auscultation without rales, wheezes or rhonchi. CARDIOVASCULAR:  Regular rate and rhythm without murmur, rub or gallop. BREAST:  Right breast without masses, skin changes or nipple discharge.  Left breast s/p lumpectomy at the 3 o'clock position. Fingertip nodule/scar midpoint of incision (no change)  No masses, skin changes or nipple discharge. ABDOMEN:  Soft, non-tender, with active bowel sounds, and no hepatosplenomegaly.  No masses. SKIN:  No rashes, ulcers or lesions. EXTREMITIES: No edema, no skin discoloration or tenderness.  No palpable cords. LYMPH NODES: No palpable cervical, supraclavicular, axillary or inguinal adenopathy  NEUROLOGICAL: Unremarkable. PSYCH:  Appropriate.    No visits with results within 3 Day(s) from this visit.  Latest known visit with results is:  Appointment on 04/27/2018  Component Date Value Ref  Range Status  . CA 27.29 04/27/2018 17.6  0.0 - 38.6 U/mL Final   Comment: (NOTE) Siemens Centaur Immunochemiluminometric Methodology [facility]) Values obtained with different assay methods or kits cannot be used interchangeably. Results cannot be interpreted as absolute evidence of the presence or absence of malignant disease. Performed At: [facility] [place], [place] [id number] Rush Farmer MD SA:[id number] Performed At: [facility] [place], [place] [id number] Nechama Guard MD UR:[id number]   . WBC 04/27/2018 11.4* 4.0 - 10.5 K/uL Final  . RBC 04/27/2018 4.40  3.87 - 5.11 MIL/uL Final  . Hemoglobin 04/27/2018 13.0  12.0 - 15.0 g/dL Final  . HCT 04/27/2018 38.9  36.0 - 46.0 % Final  . MCV 04/27/2018 88.4  80.0 - 100.0 fL Final  . MCH 04/27/2018 29.5  26.0 - 34.0 pg Final  . MCHC 04/27/2018 33.4  30.0 - 36.0 g/dL Final  . RDW 04/27/2018 13.5  11.5 - 15.5 % Final  . Platelets 04/27/2018 310  150 - 400 K/uL Final  . nRBC 04/27/2018 0.0  0.0 - 0.2 % Final  . Neutrophils Relative % 04/27/2018 61  % Final  . Neutro Abs 04/27/2018 7.0  1.7 - 7.7 K/uL Final  . Lymphocytes Relative 04/27/2018 30  % Final  . Lymphs Abs 04/27/2018 3.5  0.7 - 4.0 K/uL Final  . Monocytes Relative 04/27/2018 6  % Final  . Monocytes Absolute 04/27/2018 0.7  0.1 - 1.0 K/uL Final  . Eosinophils Relative 04/27/2018 2  % Final  . Eosinophils Absolute 04/27/2018 0.2  0.0 - 0.5 K/uL Final  . Basophils Relative 04/27/2018 1  % Final  . Basophils Absolute 04/27/2018 0.1  0.0 - 0.1 K/uL Final  . Immature Granulocytes 04/27/2018 0  % Final  . Abs Immature Granulocytes 04/27/2018 0.03  0.00 - 0.07 K/uL Final   Performed at [facility] [address], [place], [place] [address]  . Sodium 04/27/2018 137  135 - 145 mmol/L Final  . Potassium 04/27/2018 3.4* 3.5 - 5.1 mmol/L Final  . Chloride 04/27/2018 98  98 - 111 mmol/L Final  . CO2 04/27/2018 29  22 - 32  mmol/L Final  . Glucose, Bld 04/27/2018 102* 70 - 99 mg/dL Final  . BUN 04/27/2018 16  6 - 20 mg/dL Final  . Creatinine, Ser 04/27/2018 1.03* 0.44 -  1.00 mg/dL Final  . Calcium 04/27/2018 8.9  8.9 - 10.3 mg/dL Final  . Total Protein 04/27/2018 7.6  6.5 - 8.1 g/dL Final  . Albumin 04/27/2018 4.0  3.5 - 5.0 g/dL Final  . AST 04/27/2018 15  15 - 41 U/L Final  . ALT 04/27/2018 13  0 - 44 U/L Final  . Alkaline Phosphatase 04/27/2018 73  38 - 126 U/L Final  . Total Bilirubin 04/27/2018 0.3  0.3 - 1.2 mg/dL Final  . GFR calc non Af Amer 04/27/2018 >60  >60 mL/min Final  . GFR calc Af Amer 04/27/2018 >60  >60 mL/min Final  . Anion gap 04/27/2018 10  5 - 15 Final   Performed at [facility], [address], [place], [place] [address]    Assessment:  Heather Dudley is a 58 y.o. female with stage IIA left breast cancer s/p wide excision and axillary lymph node dissection on 09/03/2010.  Pathology revealed a 1.0 cm grade II invasive ductal carcinoma. One of 14 lymph nodes were positive macrometastasis of 1.3 cm.  Pathologic stage was T1cN1bM0.  Tumor was ER positive (90%), PR positive (20%), and HER-2/neu negative by FISH (Her-2/CEP17 ratio < 1.8).  She enrolled on NSABP B-47. She received AC 4 followed by weekly paclitaxel (completed on 02/06/2011).  She received breast radiation in 03/2011.  She completed a year of adjuvant Herceptin on 11/13/2011. She was on Arimidex from 07/2011 - 07/2015 and Femara from 09/04/2015 - 10/09/2015.  She stopped Arimidex and Femara secondary to vasomotor symptoms.  She began Aromasin on 10/09/2015 (discontinued in 11/2015 or 12/2015).  Breast cancer index (BCI) testing on 10/08/2015 revealed a high risk of late recurrence  Risk is estimated at 12.7% (CI: 7.4%-17.7%) during years 5-10.   She has a high likelihood of benefit from extended adjuvant hormonal therapy.  Bilateral mammogram on 12/10/2014 revealed lumpectomy changes in the left breast without  evidence of malignancy.  Bilateral mammogram on 07/17/2016 revealed no evidence of malignancy.  Left sided mammogram and ultrasound on 04/22/2017 revealed postsurgical scar with dense postsurgical calcifications unchanged compared to prior exam. There was no suspicious abnormality.  Targeted ultrasound showed postsurgical scar calcification in the palpable area lateral inferior portion of the left breast.  There was no suspicious mass is identified in the palpable area.  Bilateral diagnostic mammogram on 07/20/2017 revealed no mammographic evidence of breast malignancy.  CA27.29 has been followed: 17.4 on 11/02/2013, 21 on 12/20/2014, 19.5 on 09/04/2015, 21.7 on 10/09/2015, 17.6 on 02/05/2016, 18.6 on 04/14/2017, and 17.6 on 04/27/2018.  Chest CT angiogram on 03/21/2017 revealed no evidence of pulmonary embolism.  There was a 2.3 cm  indeterminate lesion in the right lobe of the liver.  Abdomen and pelvic CT on 04/05/2017 revealed a 2.4 cm hypodensity over the right lobe of the liver unchanged in size from 2013 and compatible with a hemangioma.  There were a ffew small liver cysts were described.    Bone density study on 12/04/2015 was normal with a T-score of -0.9 in the AP spine L1-L4 and -0.6 in the left femoral neck.  She has vasomotor symptoms well managed on Effexor.  She underwent bilateral tonsillectomy on 06/08/2016 at [facility] for obstructive sleep apnea.  She has microscopic hematuria.  Urinalysis on 07/01/2017 and 04/08/2018 revealed large RBCs.  She denies any gross hematuria.  She has no history of nephrolithiasis.  Symptomatically, she is doing well.  She denies any breast concerns.  She has dull left flank  ache and microscopic hematuria.  Exam reveals left breast post-operative changes with stable nodule mid-incision previously noted to be dense calcifications.  CA27.29 is normal.  Plan: 1.   Labs today:  CBC with diff, CMP, CA27.29. 2.   Stage IIA left breast cancer  Clinically doing  well.  Exam is stable.  CA27.29 is 17.6 (normal).  Bilateral diagnostic mammogram on 07/20/2017 revealed no evidence of malignancy.  Schedule bilateral screening mammogram on 07/21/2018 per radiology recommendations. 3.   Microscopic hematuria  Patient notes dull ache in left flank.  Review of UA from 06/2017 and 03/2018 reveals large RBCs.  Consider work-up/referral to urology. 4.   Schedule screening mammogram on 07/21/2018. 5.   RTC in 1 year for MD assessment, labs (CBC with diff, CMP, CA27.29), and review of mammogram   Lequita Asal, MD  05/04/2018, 3:52 PM

## 2018-05-04 NOTE — Progress Notes (Signed)
Prior UTI / PCP given antibiotics Marcobid 100 mg 1 tab po BID x 7 days the has completed the course.

## 2018-06-06 ENCOUNTER — Other Ambulatory Visit: Payer: Self-pay

## 2018-06-06 MED ORDER — POTASSIUM CHLORIDE CRYS ER 20 MEQ PO TBCR: 20.0000 meq | EXTENDED_RELEASE_TABLET | Freq: Every day | ORAL | 3 refills | Status: DC

## 2018-06-06 NOTE — Telephone Encounter (Signed)
Requested Prescriptions   Signed Prescriptions Disp Refills  . potassium chloride SA (K-DUR,KLOR-CON) 20 MEQ tablet 90 tablet 3    Sig: Take 1 tablet (20 mEq total) by mouth daily.    Authorizing Provider: Minna Merritts    Ordering User: Janan Ridge

## 2018-07-04 ENCOUNTER — Encounter: Payer: Self-pay | Admitting: Internal Medicine

## 2018-07-05 ENCOUNTER — Encounter: Payer: Self-pay | Admitting: Internal Medicine

## 2018-07-05 ENCOUNTER — Ambulatory Visit
Admission: RE | Admit: 2018-07-05 | Discharge: 2018-07-05 | Disposition: A | Discharge status: Home / Self Care | Payer: 59 | Attending: Internal Medicine | Admitting: Internal Medicine

## 2018-07-05 ENCOUNTER — Other Ambulatory Visit: Payer: Self-pay

## 2018-07-05 ENCOUNTER — Ambulatory Visit
Admission: RE | Admit: 2018-07-05 | Discharge: 2018-07-05 | Disposition: A | Discharge status: Home / Self Care | Payer: 59 | Source: Ambulatory Visit | Attending: Internal Medicine | Admitting: Internal Medicine

## 2018-07-05 ENCOUNTER — Ambulatory Visit (INDEPENDENT_AMBULATORY_CARE_PROVIDER_SITE_OTHER): Payer: 59 | Admitting: Internal Medicine

## 2018-07-05 VITALS — BP 120/78 | HR 76 | Ht 67.0 in | Wt 240.0 lb

## 2018-07-05 DIAGNOSIS — Z0000 Encounter for general adult medical examination without abnormal findings: Secondary | ICD-10-CM

## 2018-07-05 DIAGNOSIS — Z1231 Encounter for screening mammogram for malignant neoplasm of breast: Secondary | ICD-10-CM | POA: Diagnosis not present

## 2018-07-05 DIAGNOSIS — M545 Low back pain, unspecified: Secondary | ICD-10-CM

## 2018-07-05 DIAGNOSIS — G8929 Other chronic pain: Secondary | ICD-10-CM | POA: Insufficient documentation

## 2018-07-05 DIAGNOSIS — I479 Paroxysmal tachycardia, unspecified: Secondary | ICD-10-CM

## 2018-07-05 DIAGNOSIS — E782 Mixed hyperlipidemia: Secondary | ICD-10-CM | POA: Diagnosis not present

## 2018-07-05 DIAGNOSIS — F410 Panic disorder [episodic paroxysmal anxiety] without agoraphobia: Secondary | ICD-10-CM | POA: Diagnosis not present

## 2018-07-05 DIAGNOSIS — I10 Essential (primary) hypertension: Secondary | ICD-10-CM | POA: Diagnosis not present

## 2018-07-05 LAB — POCT URINALYSIS DIPSTICK
Bilirubin, UA: NEGATIVE
Blood, UA: NEGATIVE
Glucose, UA: NEGATIVE
Ketones, UA: NEGATIVE
Leukocytes, UA: NEGATIVE
Nitrite, UA: NEGATIVE
Protein, UA: NEGATIVE
Spec Grav, UA: 1.01 (ref 1.010–1.025)
Urobilinogen, UA: 0.2 E.U./dL
pH, UA: 6 (ref 5.0–8.0)

## 2018-07-05 MED ORDER — LORAZEPAM 0.5 MG PO TABS: 0.5000 mg | ORAL_TABLET | Freq: Three times a day (TID) | ORAL | 0 refills | Status: DC | PRN

## 2018-07-05 NOTE — Progress Notes (Signed)
Date:  07/05/2018   Name:  Heather Dudley   DOB:  [birth year]   MRN:  [id number]   Chief Complaint: Annual Exam (breast exam- no pap.) Heather Dudley is a 58 y.o. female who presents today for her Complete Annual Exam. She feels well. She reports exercising walking or calesthenics. She reports she is sleeping well.  Mammogram is scheduled No immunizations per pt preference  Hypertension  This is a chronic problem. The problem is controlled. Associated symptoms include palpitations. Pertinent negatives include no chest pain, headaches or shortness of breath. Past treatments include calcium channel blockers and diuretics. The current treatment provides significant improvement.  Back Pain  This is a chronic problem. The problem occurs intermittently. The problem has been gradually worsening since onset. The pain is present in the lumbar spine. The pain does not radiate. The pain is mild. Pertinent negatives include no abdominal pain, chest pain, dysuria, fever or headaches. She has tried chiropractic manipulation and heat for the symptoms. The treatment provided mild relief.  Breast Cancer - recent follow up with Oncology.  Labs CBC, CMP and CA27.29 normal  Mammogram scheduled. Hx of SVT - pt still has episodes lasting about one hour several times per week.  Propranolol with abort the tachycardia but pt is concerned that she may have Afib.  She gets SOB with the episodes but no other sx.  She has not had a follow with cardiology in the past year. Panic attacks - she uses ativan rarely for episodes.  She has 2 tabs left from last year.   Lab Results  Component Value Date   WBC 11.4 (H) 04/27/2018   HGB 13.0 04/27/2018   HCT 38.9 04/27/2018   MCV 88.4 04/27/2018   PLT 310 04/27/2018   Lab Results  Component Value Date   CREATININE 1.03 (H) 04/27/2018   BUN 16 04/27/2018   NA 137 04/27/2018   K 3.4 (L) 04/27/2018   CL 98 04/27/2018   CO2 29 04/27/2018      Review of  Systems  Constitutional: Negative for chills, fatigue and fever.  HENT: Negative for congestion, hearing loss, tinnitus, trouble swallowing and voice change.   Eyes: Negative for visual disturbance.  Respiratory: Negative for cough, chest tightness, shortness of breath and wheezing.   Cardiovascular: Positive for palpitations. Negative for chest pain and leg swelling.  Gastrointestinal: Negative for abdominal pain, constipation, diarrhea and vomiting.  Endocrine: Negative for polydipsia and polyuria.  Genitourinary: Negative for dysuria, frequency, genital sores, vaginal bleeding and vaginal discharge.  Musculoskeletal: Positive for back pain. Negative for arthralgias, gait problem and joint swelling.  Skin: Negative for color change and rash.  Allergic/Immunologic: Negative for environmental allergies.  Neurological: Negative for dizziness, tremors, light-headedness and headaches.  Hematological: Negative for adenopathy. Does not bruise/bleed easily.  Psychiatric/Behavioral: Negative for dysphoric mood and sleep disturbance. The patient is not nervous/anxious.     Patient Active Problem List   Diagnosis Date Noted  . Microscopic hematuria 05/04/2018  . Paroxysmal tachycardia ([place]) 05/04/2017  . Liver hemangioma 03/26/2017  . Hyperlipidemia, mixed 07/02/2016  . Anxiety attack 06/15/2016  . Macroglossia 02/05/2016  . Environmental and seasonal allergies 01/02/2016  . Hot flashes related to aromatase inhibitor therapy 09/08/2015  . Vitamin D deficiency 04/19/2015  . Cervical radiculopathy 04/17/2015  . Essential hypertension 04/17/2015  . History of partial thyroidectomy 04/17/2015  . Obesity, Class II, BMI 35-39.9 04/17/2015  . Lipoma of axilla 10/24/2014  . History of left breast cancer  09/02/2010    Allergies  Allergen Reactions  . Ace Inhibitors Swelling  . Sernivo [Betamethasone Dipropionate] Itching  . Penicillins Rash    Has patient had a PCN reaction causing immediate  rash, facial/tongue/throat swelling, SOB or lightheadedness with hypotension: Yes Has patient had a PCN reaction causing severe rash involving mucus membranes or skin necrosis: No Has patient had a PCN reaction that required hospitalization: No Has patient had a PCN reaction occurring within the last 10 years: No If all of the above answers are "NO", then may proceed with Cephalosporin use.    Past Surgical History:  Procedure Laterality Date  . ABDOMINAL HYSTERECTOMY  2011  . birth mark removed    . BREAST CYST ASPIRATION Right    negative 01/2010  . BREAST EXCISIONAL BIOPSY  August 05, 2010   left breast positive 07/2010  . BREAST LUMPECTOMY  2012   left breast  . COLONOSCOPY  July 29, 2012   normal study.  . robotic tonsillectomy Lingual tonsils Bilateral 06/08/2016  . THYROID SURGERY     partially removed  . TUBAL LIGATION      Social History   Tobacco Use  . Smoking status: Never Smoker  . Smokeless tobacco: Never Used  Substance Use Topics  . Alcohol use: No    Alcohol/week: 0.0 standard drinks  . Drug use: No     Medication list has been reviewed and updated.  Current Meds  Medication Sig  . amLODipine (NORVASC) 5 MG tablet Take 1 tablet (5 mg total) by mouth daily.  . B Complex-Biotin-FA (B-COMPLEX PO) Take 1 tablet by mouth daily.  . Ergocalciferol (VITAMIN D2) 400 units TABS Take 400 Units by mouth daily as needed.   . hydrochlorothiazide (HYDRODIURIL) 25 MG tablet TAKE 1 TABLET BY MOUTH EVERY DAY (Patient taking differently: Take 25 mg by mouth daily. )  . LORazepam (ATIVAN) 0.5 MG tablet Take 1 tablet (0.5 mg total) by mouth every 8 (eight) hours as needed for anxiety.  . Multiple Vitamin (MULTIVITAMIN) tablet Take 1 tablet by mouth daily.  . potassium chloride SA (K-DUR,KLOR-CON) 20 MEQ tablet Take 1 tablet (20 mEq total) by mouth daily.  . propranolol (INDERAL) 20 MG tablet Take 1 tablet (20 mg total) by mouth 3 (three) times daily as needed.    PHQ 2/9  Scores 07/05/2018 04/08/2018 03/26/2017 08/21/2015  PHQ - 2 Score 4 0 0 0  PHQ- 9 Score 6 - - -    BP Readings from Last 3 Encounters:  07/05/18 120/78  05/04/18 (!) 146/74  04/08/18 124/82    Physical Exam Vitals signs and nursing note reviewed.  Constitutional:      General: She is not in acute distress.    Appearance: She is well-developed.  HENT:     Head: Normocephalic and atraumatic.     Right Ear: Tympanic membrane and ear canal normal.     Left Ear: Tympanic membrane and ear canal normal.     Nose:     Right Sinus: No maxillary sinus tenderness.     Left Sinus: No maxillary sinus tenderness.     Mouth/Throat:     Pharynx: Uvula midline.  Eyes:     General: No scleral icterus.       Right eye: No discharge.        Left eye: No discharge.     Conjunctiva/sclera: Conjunctivae normal.  Neck:     Musculoskeletal: Normal range of motion. No erythema.     Thyroid: No  thyromegaly.     Vascular: No carotid bruit.  Cardiovascular:     Rate and Rhythm: Normal rate and regular rhythm.     Pulses: Normal pulses.     Heart sounds: Normal heart sounds.  Pulmonary:     Effort: Pulmonary effort is normal. No respiratory distress.     Breath sounds: No wheezing.  Chest:     Breasts:        Right: No mass, nipple discharge, skin change or tenderness.        Left: No mass, nipple discharge, skin change or tenderness.    Abdominal:     General: Bowel sounds are normal.     Palpations: Abdomen is soft.     Tenderness: There is no abdominal tenderness.  Musculoskeletal:     Lumbar back: She exhibits decreased range of motion and bony tenderness. She exhibits no spasm.  Lymphadenopathy:     Cervical: No cervical adenopathy.  Skin:    General: Skin is warm and dry.     Findings: No rash.  Neurological:     Mental Status: She is alert and oriented to person, place, and time.     Cranial Nerves: No cranial nerve deficit.     Sensory: Sensation is intact. No sensory deficit.      Motor: Motor function is intact.     Coordination: Coordination is intact.     Deep Tendon Reflexes: Reflexes are normal and symmetric.     Reflex Scores:      Patellar reflexes are 2+ on the right side and 2+ on the left side.    Comments: Negative SLR bilaterally  Psychiatric:        Attention and Perception: Attention normal.        Mood and Affect: Mood normal.        Speech: Speech normal.        Behavior: Behavior normal.        Thought Content: Thought content normal.        Cognition and Memory: Cognition normal.     Wt Readings from Last 3 Encounters:  07/05/18 240 lb (108.9 kg)  05/04/18 239 lb 8.5 oz (108.7 kg)  04/08/18 237 lb (107.5 kg)    BP 120/78   Pulse 76   Ht 5\' 7"  (1.702 m)   Wt 240 lb (108.9 kg)   LMP 04/01/2008 Comment: Hysterectomy was due to endometriosis. - still has ovaries.   SpO2 99%   BMI 37.59 kg/m   Assessment and Plan: 1. Annual physical exam Normal exam except for weight Work on better diet, continue regular exericse - POCT urinalysis dipstick  2. Encounter for screening mammogram for breast cancer Scheduled for June  3. Essential hypertension controlled  4. Anxiety attack Stable with ocasional episodes - LORazepam (ATIVAN) 0.5 MG tablet; Take 1 tablet (0.5 mg total) by mouth every 8 (eight) hours as needed for anxiety.  Dispense: 20 tablet; Refill: 0  5. Paroxysmal tachycardia ([place]) Frequent episodes; continue PRN propranolol and follow up with Cardiology Go to ED for persistent episodes - TSH + free T4  6. Hyperlipidemia, mixed Check labs and advise - Lipid panel  7. Chronic midline low back pain without sciatica Check Xray Continue Tylenol or Advil May need PTx referral - DG Lumbar Spine Complete; Future   Partially dictated using Editor, commissioning. Any errors are unintentional.  Halina Maidens, MD [facility]  07/05/2018

## 2018-07-06 ENCOUNTER — Encounter: Payer: Self-pay | Admitting: Internal Medicine

## 2018-07-06 DIAGNOSIS — E782 Mixed hyperlipidemia: Secondary | ICD-10-CM | POA: Insufficient documentation

## 2018-07-06 LAB — LIPID PANEL
Chol/HDL Ratio: 6.3 ratio — ABNORMAL HIGH (ref 0.0–4.4)
Cholesterol, Total: 290 mg/dL — ABNORMAL HIGH (ref 100–199)
HDL: 46 mg/dL (ref >39)
LDL Calculated: 216 mg/dL — ABNORMAL HIGH (ref 0–99)
Triglycerides: 142 mg/dL (ref 0–149)
VLDL Cholesterol Cal: 28 mg/dL (ref 5–40)

## 2018-07-06 LAB — TSH+FREE T4
Free T4: 0.83 ng/dL (ref 0.82–1.77)
TSH: 3.99 u[IU]/mL (ref 0.450–4.500)

## 2018-07-26 ENCOUNTER — Ambulatory Visit
Admission: RE | Admit: 2018-07-26 | Discharge: 2018-07-26 | Disposition: A | Discharge status: Home / Self Care | Payer: 59 | Source: Ambulatory Visit | Attending: Hematology and Oncology | Admitting: Hematology and Oncology

## 2018-07-26 ENCOUNTER — Other Ambulatory Visit: Payer: Self-pay

## 2018-07-26 DIAGNOSIS — Z853 Personal history of malignant neoplasm of breast: Secondary | ICD-10-CM | POA: Diagnosis not present

## 2018-07-26 DIAGNOSIS — Z1231 Encounter for screening mammogram for malignant neoplasm of breast: Secondary | ICD-10-CM | POA: Diagnosis present

## 2018-07-26 HISTORY — DX: Personal history of antineoplastic chemotherapy: Z92.21

## 2018-07-26 HISTORY — DX: Malignant neoplasm of unspecified site of unspecified female breast: C50.919

## 2018-08-08 ENCOUNTER — Telehealth: Payer: Self-pay

## 2018-08-08 NOTE — Telephone Encounter (Signed)
Patient hurt her back last week. Having lower back pain. This morning she woke up saying she "feels funny." She is having slight chest discomfort and shortness of breath.  Informed her we need her to go to the ER and be evaluated and she refused saying its "not necessary and there is no point."  Spoke with Dr. Army Melia for advice. Informed patient Dr. Army Melia recommended her to be seen in ER or [place] to be evaluated for her heart or blood clot in her lungs.   She verbalized understanding and said she will be seen in UC.

## 2018-08-10 ENCOUNTER — Encounter: Payer: Self-pay | Admitting: Internal Medicine

## 2018-08-10 ENCOUNTER — Other Ambulatory Visit: Payer: Self-pay

## 2018-08-10 ENCOUNTER — Ambulatory Visit (INDEPENDENT_AMBULATORY_CARE_PROVIDER_SITE_OTHER): Payer: 59 | Admitting: Internal Medicine

## 2018-08-10 VITALS — BP 118/76 | HR 93 | Ht 67.0 in | Wt 239.0 lb

## 2018-08-10 DIAGNOSIS — R918 Other nonspecific abnormal finding of lung field: Secondary | ICD-10-CM

## 2018-08-10 DIAGNOSIS — Z853 Personal history of malignant neoplasm of breast: Secondary | ICD-10-CM

## 2018-08-10 DIAGNOSIS — M62830 Muscle spasm of back: Secondary | ICD-10-CM

## 2018-08-10 NOTE — Progress Notes (Signed)
Date:  08/10/2018   Name:  Heather Dudley   DOB:  1961/02/01   MRN:  [id number]   Chief Complaint: Mass (Patient seen [facility] two days ago fro chest discomfort and SOB. She said they found a 66mm mass on her xray. Came her to follow up.)  Back Pain This is a new problem. The current episode started in the past 7 days. The problem is unchanged. The pain is present in the lumbar spine and thoracic spine. The quality of the pain is described as aching. The pain does not radiate. The pain is moderate. The symptoms are aggravated by twisting and bending. Pertinent negatives include no chest pain, fever or headaches. She has tried NSAIDs (could not tolerate flexeril 10 mg due to rapid heart beat) for the symptoms. The treatment provided moderate relief.   CXR at UC - 9 mm density at the left CP angle.  We do not have the films, just the report.  She denies chest pain, SOB. She has mild chronic intermittent cough that is not worse.  She is concerned due to family hx of lung cancer and personal hx of breast cancer.  Review of Systems  Constitutional: Negative for chills, fatigue and fever.  Respiratory: Positive for cough. Negative for chest tightness, shortness of breath and wheezing.   Cardiovascular: Negative for chest pain, palpitations and leg swelling.  Musculoskeletal: Positive for back pain and myalgias.  Neurological: Negative for dizziness, tremors and headaches.  Psychiatric/Behavioral: Negative for sleep disturbance.    Patient Active Problem List   Diagnosis Date Noted  . Mixed hyperlipidemia 07/06/2018  . Microscopic hematuria 05/04/2018  . Paroxysmal tachycardia ([place]) 05/04/2017  . Liver hemangioma 03/26/2017  . Anxiety attack 06/15/2016  . Macroglossia 02/05/2016  . Environmental and seasonal allergies 01/02/2016  . Hot flashes related to aromatase inhibitor therapy 09/08/2015  . Vitamin D deficiency 04/19/2015  . Cervical radiculopathy 04/17/2015  . Essential  hypertension 04/17/2015  . History of partial thyroidectomy 04/17/2015  . Obesity, Class II, BMI 35-39.9 04/17/2015  . Lipoma of axilla 10/24/2014  . History of left breast cancer 09/02/2010    Allergies  Allergen Reactions  . Ace Inhibitors Swelling  . Sernivo [Betamethasone Dipropionate] Itching  . Penicillins Rash    Has patient had a PCN reaction causing immediate rash, facial/tongue/throat swelling, SOB or lightheadedness with hypotension: Yes Has patient had a PCN reaction causing severe rash involving mucus membranes or skin necrosis: No Has patient had a PCN reaction that required hospitalization: No Has patient had a PCN reaction occurring within the last 10 years: No If all of the above answers are "NO", then may proceed with Cephalosporin use.    Past Surgical History:  Procedure Laterality Date  . ABDOMINAL HYSTERECTOMY  2011  . birth mark removed    . BREAST CYST ASPIRATION Right    negative 01/2010  . BREAST EXCISIONAL BIOPSY  August 05, 2010   left breast positive 07/2010  . BREAST LUMPECTOMY  2012   left breast  . COLONOSCOPY  July 29, 2012   normal study.  . robotic tonsillectomy Lingual tonsils Bilateral 06/08/2016  . THYROID SURGERY     partially removed  . TUBAL LIGATION      Social History   Tobacco Use  . Smoking status: Never Smoker  . Smokeless tobacco: Never Used  Substance Use Topics  . Alcohol use: No    Alcohol/week: 0.0 standard drinks  . Drug use: No  Medication list has been reviewed and updated.  Current Meds  Medication Sig  . amLODipine (NORVASC) 5 MG tablet Take 1 tablet (5 mg total) by mouth daily.  . B Complex-Biotin-FA (B-COMPLEX PO) Take 1 tablet by mouth daily.  . Ergocalciferol (VITAMIN D2) 400 units TABS Take 400 Units by mouth daily as needed.   . hydrochlorothiazide (HYDRODIURIL) 25 MG tablet TAKE 1 TABLET BY MOUTH EVERY DAY (Patient taking differently: Take 25 mg by mouth daily. )  . LORazepam (ATIVAN) 0.5 MG tablet  Take 1 tablet (0.5 mg total) by mouth every 8 (eight) hours as needed for anxiety.  . Multiple Vitamin (MULTIVITAMIN) tablet Take 1 tablet by mouth daily.  . potassium chloride SA (K-DUR,KLOR-CON) 20 MEQ tablet Take 1 tablet (20 mEq total) by mouth daily.  . propranolol (INDERAL) 20 MG tablet Take 1 tablet (20 mg total) by mouth 3 (three) times daily as needed.    PHQ 2/9 Scores 08/10/2018 07/05/2018 04/08/2018 03/26/2017  PHQ - 2 Score 2 4 0 0  PHQ- 9 Score 7 6 - -    BP Readings from Last 3 Encounters:  08/10/18 118/76  07/05/18 120/78  05/04/18 (!) 146/74    Physical Exam Vitals signs and nursing note reviewed.  Constitutional:      General: She is not in acute distress.    Appearance: She is well-developed.  HENT:     Head: Normocephalic and atraumatic.  Neck:     Musculoskeletal: Normal range of motion.  Cardiovascular:     Rate and Rhythm: Normal rate and regular rhythm.     Heart sounds: No murmur.  Pulmonary:     Effort: Pulmonary effort is normal. No respiratory distress.     Breath sounds: No wheezing or rhonchi.  Musculoskeletal: Normal range of motion.       Arms:  Skin:    General: Skin is warm and dry.     Findings: No rash.  Neurological:     Mental Status: She is alert and oriented to person, place, and time.  Psychiatric:        Behavior: Behavior normal.        Thought Content: Thought content normal.     Wt Readings from Last 3 Encounters:  08/10/18 239 lb (108.4 kg)  07/05/18 240 lb (108.9 kg)  05/04/18 239 lb 8.5 oz (108.7 kg)    BP 118/76   Pulse 93   Ht 5\' 7"  (1.702 m)   Wt 239 lb (108.4 kg)   LMP 04/01/2008 Comment: Hysterectomy was due to endometriosis. - still has ovaries.   SpO2 95%   BMI 37.43 kg/m   Assessment and Plan: 1. Abnormal findings on diagnostic imaging of lung Needs CT scan due to hx - CT CHEST W CONTRAST; Future  2. History of left breast cancer - CT CHEST W CONTRAST; Future  3. Muscle spasm of back Continue  Advil tid Use heat or ice Consider trying low dose flexeril for added benefit   Partially dictated using Editor, commissioning. Any errors are unintentional.  Halina Maidens, MD [facility]  08/10/2018

## 2018-08-19 ENCOUNTER — Other Ambulatory Visit: Payer: Self-pay

## 2018-08-19 ENCOUNTER — Ambulatory Visit
Admission: RE | Admit: 2018-08-19 | Discharge: 2018-08-19 | Disposition: A | Discharge status: Home / Self Care | Payer: 59 | Source: Ambulatory Visit | Attending: Internal Medicine | Admitting: Internal Medicine

## 2018-08-19 DIAGNOSIS — Z853 Personal history of malignant neoplasm of breast: Secondary | ICD-10-CM | POA: Diagnosis present

## 2018-08-19 DIAGNOSIS — R918 Other nonspecific abnormal finding of lung field: Secondary | ICD-10-CM | POA: Diagnosis present

## 2018-08-19 LAB — POCT I-STAT CREATININE: Creatinine, Ser: 1 mg/dL (ref 0.44–1.00)

## 2018-08-19 MED ORDER — IOHEXOL 300 MG/ML  SOLN
75.0000 mL | Freq: Once | INTRAMUSCULAR | Status: AC | PRN
  Administered 2018-08-19: 75 mL via INTRAVENOUS

## 2018-08-23 ENCOUNTER — Other Ambulatory Visit: Payer: Self-pay | Admitting: Internal Medicine

## 2018-08-23 DIAGNOSIS — E042 Nontoxic multinodular goiter: Secondary | ICD-10-CM | POA: Insufficient documentation

## 2018-08-23 NOTE — Progress Notes (Signed)
Spoke with patient. Informed of fatty liver. Nodule on CXR was not shown on CT. Small nodules around liver stable. Several small nodules on R) side of thyroid. Patient is not seeing a thyroid specialist and has not in a long time. Would like [place] to evaluate nodules and then see specialist if needed after that.  Please Advise.

## 2018-08-30 ENCOUNTER — Other Ambulatory Visit: Payer: Self-pay

## 2018-08-30 ENCOUNTER — Ambulatory Visit
Admission: RE | Admit: 2018-08-30 | Discharge: 2018-08-30 | Disposition: A | Discharge status: Home / Self Care | Payer: 59 | Source: Ambulatory Visit | Attending: Internal Medicine | Admitting: Internal Medicine

## 2018-08-30 DIAGNOSIS — E042 Nontoxic multinodular goiter: Secondary | ICD-10-CM

## 2018-09-05 ENCOUNTER — Other Ambulatory Visit: Payer: Self-pay | Admitting: Internal Medicine

## 2018-09-05 DIAGNOSIS — E042 Nontoxic multinodular goiter: Secondary | ICD-10-CM

## 2018-09-05 NOTE — Progress Notes (Signed)
Patient informed. Would like to see Dr. Charolett Bumpers in [place] .

## 2018-09-12 ENCOUNTER — Other Ambulatory Visit: Payer: Self-pay | Admitting: Otolaryngology

## 2018-09-12 DIAGNOSIS — E041 Nontoxic single thyroid nodule: Secondary | ICD-10-CM

## 2018-09-19 ENCOUNTER — Ambulatory Visit
Admission: RE | Admit: 2018-09-19 | Discharge: 2018-09-19 | Disposition: A | Discharge status: Home / Self Care | Payer: 59 | Source: Ambulatory Visit | Attending: Otolaryngology | Admitting: Otolaryngology

## 2018-09-19 ENCOUNTER — Other Ambulatory Visit: Payer: Self-pay

## 2018-09-19 DIAGNOSIS — E041 Nontoxic single thyroid nodule: Secondary | ICD-10-CM | POA: Diagnosis not present

## 2018-09-20 LAB — CYTOLOGY - NON PAP

## 2018-10-02 ENCOUNTER — Other Ambulatory Visit: Payer: Self-pay | Admitting: Internal Medicine

## 2018-10-02 DIAGNOSIS — I10 Essential (primary) hypertension: Secondary | ICD-10-CM

## 2018-10-24 ENCOUNTER — Other Ambulatory Visit: Payer: Self-pay | Admitting: Internal Medicine

## 2018-10-24 DIAGNOSIS — I10 Essential (primary) hypertension: Secondary | ICD-10-CM

## 2018-10-26 ENCOUNTER — Other Ambulatory Visit: Payer: Self-pay

## 2018-10-26 MED ORDER — PROPRANOLOL HCL 20 MG PO TABS: 20.0000 mg | ORAL_TABLET | Freq: Three times a day (TID) | ORAL | 0 refills | Status: DC | PRN

## 2018-11-18 ENCOUNTER — Other Ambulatory Visit: Payer: Self-pay | Admitting: Cardiovascular Disease

## 2018-12-04 NOTE — Progress Notes (Signed)
Cardiology Office Note  Date:  12/06/2018   ID:  Heather Dudley, DOB [birth year], MRN [id number]  PCP:  Glean Hess, MD   Chief Complaint  Patient presents with  . Other    12 month follow up. Patient denies chest pain and SOB at this time. Meds reviewed verbally with patient.     HPI:  Heather Dudley is a 58 y.o. female  HTN Nonsmoker No diabetes History of breast cancer on the left, lumpectomy and lymph node dissection.  Completed chemotherapy remote chest pain Presents for f/u of her chest pain, tachycardia  Currently working from home Diet is poor, not exercising Weight up 15 pounds Frustrated by the weight gain  Rare tachycardia , started after chemo Better with potassium Also takes magnesium Does not need propranolol  Lab work discussed with her, trend upwards in her cholesterol over the past 2 years Cholesterol 220 now is 290  Denies having any significant chest pain Previous episodes of tachycardia pins-and-needles relieved with Ativan No shortness of breath on exertion  CT chest: Images pulled up in the office today  No significant coronary calcified atherosclerosis or aortic plaque  EKG personally reviewed by myself on todays visit Shows normal sinus rhythm rate 78 bpm poor R wave progression through the anterior precordial leads, left axis deviation  Other past medical history reviewed  March 21, 2017 she awoke from sleep with tachycardia  tightness in her chest Went to the emergency room, work-up negative  History of lumpectomy and lymph node dissection.  Sometimes this causes discomfort in her left arm  PMH:   has a past medical history of Basal cell carcinoma of forehead, Breast cancer ([place]), Breast screening, unspecified, Cellulitis and abscess of trunk, Hernia, History of chemotherapy (2012), Hypertension, Lump or mass in breast, Malignant neoplasm of breast (female), unspecified site, Malignant neoplasm of upper-outer quadrant of  female breast ([place]), Neoplasm of uncertain behavior of connective and other soft tissue, Obesity, unspecified, Personal history of chemotherapy, Personal history of malignant neoplasm of breast, Personal history of radiation therapy (2013), Screening for obesity, and Thyroid nodule.  PSH:    Past Surgical History:  Procedure Laterality Date  . ABDOMINAL HYSTERECTOMY  2011  . birth mark removed    . BREAST CYST ASPIRATION Right    negative 01/2010  . BREAST EXCISIONAL BIOPSY  August 05, 2010   left breast positive 07/2010  . BREAST LUMPECTOMY  2012   left breast  . COLONOSCOPY  July 29, 2012   normal study.  . robotic tonsillectomy Lingual tonsils Bilateral 06/08/2016  . THYROID SURGERY     partially removed  . TUBAL LIGATION      Current Outpatient Medications  Medication Sig Dispense Refill  . amLODipine (NORVASC) 5 MG tablet TAKE 1 TABLET BY MOUTH EVERY DAY 90 tablet 1  . B Complex-Biotin-FA (B-COMPLEX PO) Take 1 tablet by mouth daily.    . CHOLECALCIFEROL PO Take 4,000 Units by mouth daily.    . hydrochlorothiazide (HYDRODIURIL) 25 MG tablet Take 1 tablet (25 mg total) by mouth daily. 90 tablet 3  . LORazepam (ATIVAN) 0.5 MG tablet Take 1 tablet (0.5 mg total) by mouth every 8 (eight) hours as needed for anxiety. 20 tablet 0  . Multiple Vitamin (MULTIVITAMIN) tablet Take 1 tablet by mouth daily.    . potassium chloride SA (K-DUR,KLOR-CON) 20 MEQ tablet Take 1 tablet (20 mEq total) by mouth daily. 90 tablet 3  . propranolol (INDERAL) 20 MG tablet TAKE 1  TABLET (20 MG TOTAL) BY MOUTH 3 (THREE) TIMES DAILY AS NEEDED. 90 tablet 0   No current facility-administered medications for this visit.      Allergies:   Ace inhibitors, Sernivo [betamethasone dipropionate], and Penicillins   Social History:  The patient  reports that she has never smoked. She has never used smokeless tobacco. She reports that she does not drink alcohol or use drugs.   Family History:   family history includes  Cancer in her cousin, maternal uncle, mother, and paternal aunt; Colon cancer in an other family member; Heart failure in her father; Ovarian cancer in an other family member.    Review of Systems: Review of Systems  Constitutional: Negative.   HENT: Negative.   Respiratory: Negative.   Cardiovascular: Negative.   Gastrointestinal: Negative.   Musculoskeletal: Negative.   Neurological: Negative.   Psychiatric/Behavioral: Negative.   All other systems reviewed and are negative.   PHYSICAL EXAM: VS:  BP 136/80 (BP Location: Left Arm, Patient Position: Sitting, Cuff Size: Normal)   Ht 5\' 7"  (1.702 m)   Wt 242 lb (109.8 kg)   LMP 04/01/2008 Comment: Hysterectomy was due to endometriosis. - still has ovaries.   BMI 37.90 kg/m  , BMI Body mass index is 37.9 kg/m. GEN: Well nourished, well developed, in no acute distress  HEENT: normal  Neck: no JVD, carotid bruits, or masses Cardiac: RRR; no murmurs, rubs, or gallops,no edema  Respiratory:  clear to auscultation bilaterally, normal work of breathing GI: soft, nontender, nondistended, + BS MS: no deformity or atrophy  Skin: warm and dry, no rash Neuro:  Strength and sensation are intact Psych: euthymic mood, full affect    Recent Labs: 04/27/2018: ALT 13; BUN 16; Hemoglobin 13.0; Platelets 310; Potassium 3.4; Sodium 137 07/05/2018: TSH 3.990 08/19/2018: Creatinine, Ser 1.00    Lipid Panel Lab Results  Component Value Date   CHOL 290 (H) 07/05/2018   HDL 46 07/05/2018   LDLCALC 216 (H) 07/05/2018   TRIG 142 07/05/2018      Wt Readings from Last 3 Encounters:  12/06/18 242 lb (109.8 kg)  08/10/18 239 lb (108.4 kg)  07/05/18 240 lb (108.9 kg)     ASSESSMENT AND PLAN:  Paroxysmal tachycardia ([facility]) Denies having significant symptoms He seem to resolve with taking potassium and magnesium, has not had to take propranolol  Chest pain, unspecified type - Plan: EKG 12-Lead Atypical in nature, CT scan chest reviewed no  coronary calcification, no aortic atherosclerosis No further work-up neede  Hyperlipidemia, mixed Cholesterol is markedly elevated over the past 2 years, correlating with weight gain She does not want a statin at this time Prefers lifestyle changes  Essential hypertension - Plan: EKG 12-Lead Blood pressure is well controlled on today's visit. No changes made to the medications.   Malignant neoplasm of left breast in female, estrogen receptor positive, unspecified site of breast ([place]) Previous chemotherapy and radiation on the left Lymphedema left arm Reports she is no longer under surveillance  Morbid obesity We have encouraged continued exercise, careful diet management in an effort to lose weight.   Disposition:   F/U as needed   Total encounter time more than 60 minutes  Greater than 50% was spent in counseling and coordination of care with the patient   No orders of the defined types were placed in this encounter.    Signed, Esmond Plants, M.D., Ph.D. 12/06/2018  [place], [place]

## 2018-12-06 ENCOUNTER — Ambulatory Visit (INDEPENDENT_AMBULATORY_CARE_PROVIDER_SITE_OTHER): Payer: 59 | Admitting: Cardiovascular Disease

## 2018-12-06 ENCOUNTER — Encounter: Payer: Self-pay | Admitting: Cardiovascular Disease

## 2018-12-06 ENCOUNTER — Other Ambulatory Visit: Payer: Self-pay

## 2018-12-06 ENCOUNTER — Other Ambulatory Visit
Admission: RE | Admit: 2018-12-06 | Discharge: 2018-12-06 | Disposition: A | Discharge status: Home / Self Care | Payer: 59 | Source: Ambulatory Visit | Attending: Cardiovascular Disease | Admitting: Cardiovascular Disease

## 2018-12-06 VITALS — BP 136/80 | Ht 67.0 in | Wt 242.0 lb

## 2018-12-06 DIAGNOSIS — I479 Paroxysmal tachycardia, unspecified: Secondary | ICD-10-CM | POA: Insufficient documentation

## 2018-12-06 DIAGNOSIS — R079 Chest pain, unspecified: Secondary | ICD-10-CM

## 2018-12-06 DIAGNOSIS — I10 Essential (primary) hypertension: Secondary | ICD-10-CM

## 2018-12-06 DIAGNOSIS — E782 Mixed hyperlipidemia: Secondary | ICD-10-CM

## 2018-12-06 DIAGNOSIS — Z170 Estrogen receptor positive status [ER+]: Secondary | ICD-10-CM

## 2018-12-06 DIAGNOSIS — C50912 Malignant neoplasm of unspecified site of left female breast: Secondary | ICD-10-CM

## 2018-12-06 LAB — BASIC METABOLIC PANEL
Anion gap: 13 (ref 5–15)
BUN: 17 mg/dL (ref 6–20)
CO2: 27 mmol/L (ref 22–32)
Calcium: 9.3 mg/dL (ref 8.9–10.3)
Chloride: 98 mmol/L (ref 98–111)
Creatinine, Ser: 1.21 mg/dL — ABNORMAL HIGH (ref 0.44–1.00)
GFR calc Af Amer: 58 mL/min — ABNORMAL LOW (ref >60)
GFR calc non Af Amer: 50 mL/min — ABNORMAL LOW (ref >60)
Glucose, Bld: 95 mg/dL (ref 70–99)
Potassium: 3.3 mmol/L — ABNORMAL LOW (ref 3.5–5.1)
Sodium: 138 mmol/L (ref 135–145)

## 2018-12-06 NOTE — Patient Instructions (Addendum)
Medication Instructions:  No changes  If you need a refill on your cardiac medications before your next appointment, please call your pharmacy.    Lab work: - Your physician recommends that you have lab work today: [employer], 1st desk on the right (Registration) to check in    If you have labs (blood work) drawn today and your tests are completely normal, you will receive your results only by: [name] MyChart Message (if you have MyChart) OR . A paper copy in the mail If you have any lab test that is abnormal or we need to change your treatment, we will call you to review the results.   Testing/Procedures: No new testing needed   Follow-Up: At [facility], you and your health needs are our priority.  As part of our continuing mission to provide you with exceptional heart care, we have created designated Provider Care Teams.  These Care Teams include your primary Cardiologist (physician) and Advanced Practice Providers (APPs -  Physician Assistants and Nurse Practitioners) who all work together to provide you with the care you need, when you need it.  . You will need a follow up appointment as needed .   Please call our office 2 months in advance to schedule this appointment.    . Providers on your designated Care Team:   . Murray Hodgkins, NP . Christell Faith, PA-C . Marrianne Mood, PA-C  Any Other Special Instructions Will Be Listed Below (If Applicable).  For educational health videos Log in to : [email or web address] Or : [email or web address], password : [email or web address]

## 2018-12-07 ENCOUNTER — Telehealth: Payer: Self-pay | Admitting: Cardiovascular Disease

## 2018-12-07 DIAGNOSIS — I10 Essential (primary) hypertension: Secondary | ICD-10-CM

## 2018-12-07 DIAGNOSIS — E876 Hypokalemia: Secondary | ICD-10-CM

## 2018-12-07 NOTE — Telephone Encounter (Signed)
Attempted to call the patient. No answer- I left a message to please call back.  

## 2018-12-07 NOTE — Telephone Encounter (Signed)
Notes recorded by Minna Merritts, MD on 12/07/2018 at 5:02 PM EDT  Low potassium on lab work, 3.3  . She is taking potassium 20 daily   Options are  1) increase potassium up to 40 mEq daily   2). Other option would be to start spironolactone 25 mg daily with her current medications  She would need 4 days of potassium 40 daily before going back to 20 daily

## 2018-12-08 NOTE — Telephone Encounter (Signed)
No answer. Left message to call back.   

## 2018-12-08 NOTE — Telephone Encounter (Signed)
Attempted to call the patient.  No answer- I left a message to please call back today.

## 2018-12-08 NOTE — Telephone Encounter (Signed)
-----   Message from Emily Filbert, RN sent at 12/08/2018 11:10 AM EDT ----- Attempted to call the patient. No answer- I left a message to please call back today.

## 2018-12-13 ENCOUNTER — Encounter: Payer: Self-pay | Admitting: *Deleted

## 2018-12-13 MED ORDER — SPIRONOLACTONE 25 MG PO TABS: 25.0000 mg | ORAL_TABLET | Freq: Every day | ORAL | 3 refills | Status: DC

## 2018-12-13 MED ORDER — POTASSIUM CHLORIDE CRYS ER 20 MEQ PO TBCR: EXTENDED_RELEASE_TABLET | ORAL | 3 refills | Status: DC

## 2018-12-13 NOTE — Telephone Encounter (Signed)
MyChart message sent to patient. Letter mailed to patient as well.   Attempted to reach patient again and no answer. Left message for her to call us back.   Called patient's husband's number and he answered. Patient was with him and I was able to talke to her.  Per Nira Conn, RN, Dr Rockey Situ prefers option 2. Patient verbalized understanding to take potassium 40 meQ daily for 4 days, then go back to 20 meq daily. SHe is aware to add spironolactone 25 mg daily to her medication regimen as well. She will plan to go to the [place] in 10-14 days for repeat BMET.

## 2018-12-29 ENCOUNTER — Other Ambulatory Visit
Admission: RE | Admit: 2018-12-29 | Discharge: 2018-12-29 | Disposition: A | Discharge status: Home / Self Care | Payer: 59 | Source: Ambulatory Visit | Attending: Cardiovascular Disease | Admitting: Cardiovascular Disease

## 2018-12-29 DIAGNOSIS — I10 Essential (primary) hypertension: Secondary | ICD-10-CM | POA: Diagnosis present

## 2018-12-29 DIAGNOSIS — E876 Hypokalemia: Secondary | ICD-10-CM | POA: Diagnosis present

## 2018-12-29 LAB — BASIC METABOLIC PANEL
Anion gap: 11 (ref 5–15)
BUN: 17 mg/dL (ref 6–20)
CO2: 30 mmol/L (ref 22–32)
Calcium: 10 mg/dL (ref 8.9–10.3)
Chloride: 100 mmol/L (ref 98–111)
Creatinine, Ser: 1.35 mg/dL — ABNORMAL HIGH (ref 0.44–1.00)
GFR calc Af Amer: 51 mL/min — ABNORMAL LOW (ref >60)
GFR calc non Af Amer: 44 mL/min — ABNORMAL LOW (ref >60)
Glucose, Bld: 107 mg/dL — ABNORMAL HIGH (ref 70–99)
Potassium: 3.5 mmol/L (ref 3.5–5.1)
Sodium: 141 mmol/L (ref 135–145)

## 2019-01-04 ENCOUNTER — Telehealth: Payer: Self-pay | Admitting: *Deleted

## 2019-01-04 NOTE — Telephone Encounter (Signed)
Spoke with patient and she reports that she just does not want to take the spironolactone and she does not feel well. So she stopped spironolactone because she felt so bad. She is only taking the amlodipine 5 mg once daily, HCTZ 25 mg once daily, Potassium 40 meq once daily, and that has been all. Reviewed Dr. Donivan Scull recommendations and she would like to clarify because she does not take the spironolactone and does not want to stop the HCTZ because it will cause her to start swelling again. Advised I would send back to provider for clarification and she verbalized understanding and was appreciative for our patience with this. She had no further questions at this time.

## 2019-01-04 NOTE — Telephone Encounter (Signed)
-----   Message from Minna Merritts, MD sent at 12/31/2018  3:34 PM EST ----- BMP Potassium still low but better Kidneys look a little dry Would stay on the spironolactone and stay on the same amount of potassium daily Would take HCTZ every other day

## 2019-01-04 NOTE — Telephone Encounter (Signed)
Left voicemail message to call back  

## 2019-01-06 NOTE — Telephone Encounter (Signed)
Okay to stay on HCTZ daily with potassium 40 daily As if she was taking 20 daily previously with HCTZ, potassium was low

## 2019-01-10 NOTE — Telephone Encounter (Signed)
No answer. Left message to call back.   

## 2019-01-11 MED ORDER — POTASSIUM CHLORIDE CRYS ER 20 MEQ PO TBCR: 40.0000 meq | EXTENDED_RELEASE_TABLET | Freq: Every day | ORAL | 3 refills | Status: DC

## 2019-01-11 NOTE — Telephone Encounter (Signed)
Spoke with patient and reviewed provider recommendations to continue HCTZ and increase potassium to 40 mEq daily. Removed spironolactone from her medication list and she verbalized understanding of all instructions. She was appreciative for the information and had no further questions at this time.

## 2019-04-21 ENCOUNTER — Other Ambulatory Visit: Payer: Self-pay | Admitting: Internal Medicine

## 2019-04-21 DIAGNOSIS — I10 Essential (primary) hypertension: Secondary | ICD-10-CM

## 2019-05-01 ENCOUNTER — Other Ambulatory Visit: Payer: Self-pay

## 2019-05-01 ENCOUNTER — Inpatient Hospital Stay: Payer: BC Managed Care – PPO | Attending: Hematology and Oncology

## 2019-05-01 DIAGNOSIS — R911 Solitary pulmonary nodule: Secondary | ICD-10-CM | POA: Insufficient documentation

## 2019-05-01 DIAGNOSIS — I10 Essential (primary) hypertension: Secondary | ICD-10-CM | POA: Diagnosis not present

## 2019-05-01 DIAGNOSIS — M545 Low back pain: Secondary | ICD-10-CM | POA: Insufficient documentation

## 2019-05-01 DIAGNOSIS — Z853 Personal history of malignant neoplasm of breast: Secondary | ICD-10-CM | POA: Diagnosis not present

## 2019-05-01 DIAGNOSIS — E042 Nontoxic multinodular goiter: Secondary | ICD-10-CM | POA: Diagnosis not present

## 2019-05-01 LAB — CBC WITH DIFFERENTIAL/PLATELET
Abs Immature Granulocytes: 0.02 10*3/uL (ref 0.00–0.07)
Basophils Absolute: 0.1 10*3/uL (ref 0.0–0.1)
Basophils Relative: 1 %
Eosinophils Absolute: 0.2 10*3/uL (ref 0.0–0.5)
Eosinophils Relative: 3 %
HCT: 40.1 % (ref 36.0–46.0)
Hemoglobin: 12.9 g/dL (ref 12.0–15.0)
Immature Granulocytes: 0 %
Lymphocytes Relative: 35 %
Lymphs Abs: 3.5 10*3/uL (ref 0.7–4.0)
MCH: 28.3 pg (ref 26.0–34.0)
MCHC: 32.2 g/dL (ref 30.0–36.0)
MCV: 87.9 fL (ref 80.0–100.0)
Monocytes Absolute: 0.5 10*3/uL (ref 0.1–1.0)
Monocytes Relative: 5 %
Neutro Abs: 5.5 10*3/uL (ref 1.7–7.7)
Neutrophils Relative %: 56 %
Platelets: 310 10*3/uL (ref 150–400)
RBC: 4.56 MIL/uL (ref 3.87–5.11)
RDW: 13.8 % (ref 11.5–15.5)
WBC: 9.8 10*3/uL (ref 4.0–10.5)
nRBC: 0 % (ref 0.0–0.2)

## 2019-05-01 LAB — COMPREHENSIVE METABOLIC PANEL
ALT: 13 U/L (ref 0–44)
AST: 18 U/L (ref 15–41)
Albumin: 4.2 g/dL (ref 3.5–5.0)
Alkaline Phosphatase: 68 U/L (ref 38–126)
Anion gap: 10 (ref 5–15)
BUN: 11 mg/dL (ref 6–20)
CO2: 27 mmol/L (ref 22–32)
Calcium: 9.3 mg/dL (ref 8.9–10.3)
Chloride: 96 mmol/L — ABNORMAL LOW (ref 98–111)
Creatinine, Ser: 0.99 mg/dL (ref 0.44–1.00)
GFR calc Af Amer: >60 mL/min (ref >60)
GFR calc non Af Amer: >60 mL/min (ref >60)
Glucose, Bld: 107 mg/dL — ABNORMAL HIGH (ref 70–99)
Potassium: 3.6 mmol/L (ref 3.5–5.1)
Sodium: 133 mmol/L — ABNORMAL LOW (ref 135–145)
Total Bilirubin: 0.5 mg/dL (ref 0.3–1.2)
Total Protein: 7.8 g/dL (ref 6.5–8.1)

## 2019-05-02 LAB — CANCER ANTIGEN 27.29: CA 27.29: 20.2 U/mL (ref 0.0–38.6)

## 2019-05-04 ENCOUNTER — Encounter: Payer: Self-pay | Admitting: Nurse Practitioner

## 2019-05-04 ENCOUNTER — Inpatient Hospital Stay: Payer: BC Managed Care – PPO | Admitting: Nurse Practitioner

## 2019-05-04 ENCOUNTER — Other Ambulatory Visit: Payer: Self-pay

## 2019-05-04 ENCOUNTER — Inpatient Hospital Stay (HOSPITAL_BASED_OUTPATIENT_CLINIC_OR_DEPARTMENT_OTHER): Payer: BC Managed Care – PPO | Admitting: Nurse Practitioner

## 2019-05-04 VITALS — BP 152/93 | HR 77 | Temp 97.3°F | Resp 18 | Ht 67.0 in | Wt 245.5 lb

## 2019-05-04 DIAGNOSIS — Z853 Personal history of malignant neoplasm of breast: Secondary | ICD-10-CM

## 2019-05-04 DIAGNOSIS — I10 Essential (primary) hypertension: Secondary | ICD-10-CM

## 2019-05-04 NOTE — Progress Notes (Signed)
[facility] day:  05/04/2019   Chief Complaint: Heather Dudley is a 59 y.o. female with history of stage IIa left breast cancer who returns to clinic for 1 year evaluation.  HPI: Patient was last seen in medical oncology by Dr. Mike Gip on 05/04/2018.  She is status post wide local excision and axillary lymph node dissection on 09/03/2010.  Pathology revealed 1.0 cm grade 2 invasive ductal carcinoma, 1 of 14 lymph nodes were positive macrometastasis of 1.3 cm.  Pathologic stage T1cN1bM0.  Tumor is ER/PR positive HER-2/neu negative by FISH.  She enrolled on NSABP B-47 and received AC chemotherapy x 4 followed by weekly paclitaxel completed on 02/06/2011 followed by breast radiation completed 03/2011.  She completed a year of adjuvant Herceptin on 11/13/2011.  She was on Arimidex from 07/2011-07/2015 and Femara from 09/04/2015-10/09/2015.  She stopped due to vasomotor symptoms.  She began Aromasin on 10/09/2015 and discontinued 2 to 3 months later due to side effects.  PCI testing revealed a high risk of late recurrence estimated at 12.7% during years 5-10.  She had a high likelihood of benefit from extended adjuvant hormonal therapy but declined additional treatments due to side effects.  Also trialed Effexor for nonhormonal management of vasomotor symptoms without significant relief.  Today, she says that she feels well and denies specific complaints.  She has been struggling with her blood pressure and side effects of medications.  Recently discontinued amlodipine due to possible palpitations.  Previously took propanolol which she also discontinued due to side effects.  She says she performs self breast exam intermittently.  Denies any lumps or bumps.  Has ongoing low back pain which she is followed for by her PCP and has a chiropractor who helps her manage her symptoms.  Last bone density scan was normal in 2017.  Previously had microscopic hematuria and was  encouraged to follow-up with urology which she declined.  No blood in her urine or pain.  Says that she is previously been talking with her PCP about seeing a kidney doctor.  Today, she denies breast changes.  Last mammogram was 07/26/2018 and reportedly normal except for pulmonary nodule.  This was followed up with CT of the chest which reported stable 5 mm pulmonary nodule in the right lower lobe.  Follow-up was not recommended.  In the interim, has been diagnosed with thyroid nodules and underwent biopsy which was benign.     Past Medical History:  Diagnosis Date  . Basal cell carcinoma of forehead    birthmark of forehead  . Breast cancer ([place])   . Breast screening, unspecified   . Cellulitis and abscess of trunk   . Hernia   . History of chemotherapy 2012  . Hypertension   . Lump or mass in breast   . Malignant neoplasm of breast (female), unspecified site    She underwent wide excision, mastoplasty and axillary dissection for her left breast malignancy on September 03, 2010. The primary tumor was 1 cm in diameter, and a single positive axillary node 1.3 cm in diameter. This was an ER-positive, PR slightly positive, HER-2/neu not overexpressing tumor. She tolerated her whole breast radiation without difficulty ending in late February 2013.  . Malignant neoplasm of upper-outer quadrant of female breast ([place])   . 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 Obesity, unspecified   . Personal history of chemotherapy   . Personal history of malignant neoplasm of breast  The patient underwent wide excision, mastoplasty. Dissection for a T1b, N1, M0 carcinoma left breast on September 03, 2010.The primary tumor was 1 cm in diameter, and a single positive axillary node 1.3 cm in diameter. This was an ER-positive, PR slightly positive, HER-2/neu not overexpressing tumor.  The patient completed whole breast radiation in February 2013.  [name] Personal history of radiation therapy 2013    LEFT lumpectomy  . Screening for obesity   . Thyroid nodule     Past Surgical History:  Procedure Laterality Date  . ABDOMINAL HYSTERECTOMY  2011  . birth mark removed    . BREAST CYST ASPIRATION Right    negative 01/2010  . BREAST EXCISIONAL BIOPSY  August 05, 2010   left breast positive 07/2010  . BREAST LUMPECTOMY  2012   left breast  . COLONOSCOPY  July 29, 2012   normal study.  . robotic tonsillectomy Lingual tonsils Bilateral 06/08/2016  . THYROID SURGERY     partially removed  . TUBAL LIGATION      Family History  Problem Relation Age of Onset  . Colon cancer Other   . Ovarian cancer Other   . Cancer Mother        lung ca  . Cancer Maternal Uncle        lung ca  . Cancer Paternal Aunt        ovarian ca  . Cancer Cousin        female ca  . Heart failure Father   . Breast cancer Neg Hx     Social History:  reports that she has never smoked. She has never used smokeless tobacco. She reports that she does not drink alcohol or use drugs.  She has 3 sons (ages 29, 32, and 48).  One son lives at home and is a Research scientist (medical).  She is from [place].  She lives in [place].  The patient is alone today.  Allergies:  Allergies  Allergen Reactions  . Ace Inhibitors Swelling  . Sernivo [Betamethasone Dipropionate] Itching  . Penicillins Rash    Has patient had a PCN reaction causing immediate rash, facial/tongue/throat swelling, SOB or lightheadedness with hypotension: Yes Has patient had a PCN reaction causing severe rash involving mucus membranes or skin necrosis: No Has patient had a PCN reaction that required hospitalization: No Has patient had a PCN reaction occurring within the last 10 years: No If all of the above answers are "NO", then may proceed with Cephalosporin use.    Current Medications: Current Outpatient Medications  Medication Sig Dispense Refill  . B Complex-Biotin-FA (B-COMPLEX PO) Take 1 tablet by mouth daily.    . CHOLECALCIFEROL PO Take 4,000  Units by mouth daily.    . hydrochlorothiazide (HYDRODIURIL) 25 MG tablet Take 1 tablet (25 mg total) by mouth daily. 90 tablet 3  . Multiple Vitamin (MULTIVITAMIN) tablet Take 1 tablet by mouth daily.    . potassium chloride SA (KLOR-CON) 20 MEQ tablet Take 2 tablets (40 mEq total) by mouth daily. 180 tablet 3  . amLODipine (NORVASC) 5 MG tablet TAKE 1 TABLET BY MOUTH EVERY DAY (Patient not taking: Reported on 05/04/2019) 90 tablet 1  . LORazepam (ATIVAN) 0.5 MG tablet Take 1 tablet (0.5 mg total) by mouth every 8 (eight) hours as needed for anxiety. (Patient not taking: Reported on 05/04/2019) 20 tablet 0  . propranolol (INDERAL) 20 MG tablet TAKE 1 TABLET (20 MG TOTAL) BY MOUTH 3 (THREE) TIMES DAILY AS NEEDED. (Patient not taking: Reported  on 05/04/2019) 90 tablet 0   No current facility-administered medications for this visit.   Review of Systems  Constitutional: Negative for chills, fever, malaise/fatigue and weight loss.  HENT: Negative for hearing loss, nosebleeds, sore throat and tinnitus.   Eyes: Negative for blurred vision and double vision.  Respiratory: Negative for cough, hemoptysis, shortness of breath and wheezing.   Cardiovascular: Negative for chest pain, palpitations and leg swelling.  Gastrointestinal: Negative for abdominal pain, blood in stool, constipation, diarrhea, melena, nausea and vomiting.  Genitourinary: Negative for dysuria and urgency.  Musculoskeletal: Negative for back pain, falls, joint pain, myalgias and neck pain.  Skin: Negative for itching and rash.  Neurological: Negative for dizziness, tingling, sensory change, loss of consciousness, weakness and headaches.  Endo/Heme/Allergies: Negative for environmental allergies. Does not bruise/bleed easily.  Psychiatric/Behavioral: Negative for depression. The patient is not nervous/anxious and does not have insomnia.      Physical Exam: Blood pressure (!) 152/93, pulse 77, temperature (!) 97.3 F (36.3 C),  temperature source Tympanic, resp. rate 18, height 5' 7" (1.702 m), weight 245 lb 7.7 oz (111.4 kg), last menstrual period 04/01/2008, SpO2 100 %.  Physical Exam Constitutional:      General: She is not in acute distress.    Appearance: She is well-developed. She is obese.     Comments: Unaccompanied.  Wearing mask.  HENT:     Head: Atraumatic.     Nose: Nose normal.     Mouth/Throat:     Pharynx: No oropharyngeal exudate.  Eyes:     General: No scleral icterus.    Conjunctiva/sclera: Conjunctivae normal.  Pulmonary:     Effort: No respiratory distress.     Breath sounds: Normal breath sounds.  Chest:     Comments: Breast: Exam chaperoned by nursing  Right breast without masses, skin changes, or nipple discharge. Left breast-status post lumpectomy at the 3 o'clock position.  Slight nodularity ~1-2 mm midplane at incision.  Stable per patient.  No masses, skin changes, or nipple discharge Abdominal:     General: There is no distension.  Musculoskeletal:        General: No deformity. Normal range of motion.  Lymphadenopathy:     Cervical: No cervical adenopathy.  Skin:    General: Skin is warm and dry.  Neurological:     Mental Status: She is alert and oriented to person, place, and time.     Motor: No weakness.  Psychiatric:        Mood and Affect: Mood normal.        Behavior: Behavior normal.     No visits with results within 3 Day(s) from this visit.  Latest known visit with results is:  Appointment on 05/01/2019  Component Date Value Ref Range Status  . CA 27.29 05/01/2019 20.2  0.0 - 38.6 U/mL Final   Comment: (NOTE) Siemens Centaur Immunochemiluminometric Methodology [facility]) Values obtained with different assay methods or kits cannot be used interchangeably. Results cannot be interpreted as absolute evidence of the presence or absence of malignant disease. Performed At: [facility] [place], [place] [id number] Rush Farmer MD  [id number]   . Sodium 05/01/2019 133* 135 - 145 mmol/L Final  . Potassium 05/01/2019 3.6  3.5 - 5.1 mmol/L Final  . Chloride 05/01/2019 96* 98 - 111 mmol/L Final  . CO2 05/01/2019 27  22 - 32 mmol/L Final  . Glucose, Bld 05/01/2019 107* 70 - 99 mg/dL Final   Glucose reference range applies only  to samples taken after fasting for at least 8 hours.  . BUN 05/01/2019 11  6 - 20 mg/dL Final  . Creatinine, Ser 05/01/2019 0.99  0.44 - 1.00 mg/dL Final  . Calcium 05/01/2019 9.3  8.9 - 10.3 mg/dL Final  . Total Protein 05/01/2019 7.8  6.5 - 8.1 g/dL Final  . Albumin 05/01/2019 4.2  3.5 - 5.0 g/dL Final  . AST 05/01/2019 18  15 - 41 U/L Final  . ALT 05/01/2019 13  0 - 44 U/L Final  . Alkaline Phosphatase 05/01/2019 68  38 - 126 U/L Final  . Total Bilirubin 05/01/2019 0.5  0.3 - 1.2 mg/dL Final  . GFR calc non Af Amer 05/01/2019 >60  >60 mL/min Final  . GFR calc Af Amer 05/01/2019 >60  >60 mL/min Final  . Anion gap 05/01/2019 10  5 - 15 Final   Performed at [facility], [address], [place], [place] [address]  . WBC 05/01/2019 9.8  4.0 - 10.5 K/uL Final  . RBC 05/01/2019 4.56  3.87 - 5.11 MIL/uL Final  . Hemoglobin 05/01/2019 12.9  12.0 - 15.0 g/dL Final  . HCT 05/01/2019 40.1  36.0 - 46.0 % Final  . MCV 05/01/2019 87.9  80.0 - 100.0 fL Final  . MCH 05/01/2019 28.3  26.0 - 34.0 pg Final  . MCHC 05/01/2019 32.2  30.0 - 36.0 g/dL Final  . RDW 05/01/2019 13.8  11.5 - 15.5 % Final  . Platelets 05/01/2019 310  150 - 400 K/uL Final  . nRBC 05/01/2019 0.0  0.0 - 0.2 % Final  . Neutrophils Relative % 05/01/2019 56  % Final  . Neutro Abs 05/01/2019 5.5  1.7 - 7.7 K/uL Final  . Lymphocytes Relative 05/01/2019 35  % Final  . Lymphs Abs 05/01/2019 3.5  0.7 - 4.0 K/uL Final  . Monocytes Relative 05/01/2019 5  % Final  . Monocytes Absolute 05/01/2019 0.5  0.1 - 1.0 K/uL Final  . Eosinophils Relative 05/01/2019 3  % Final  . Eosinophils Absolute 05/01/2019 0.2  0.0 - 0.5 K/uL Final   . Basophils Relative 05/01/2019 1  % Final  . Basophils Absolute 05/01/2019 0.1  0.0 - 0.1 K/uL Final  . Immature Granulocytes 05/01/2019 0  % Final  . Abs Immature Granulocytes 05/01/2019 0.02  0.00 - 0.07 K/uL Final   Performed at [facility], [address], [place], [place] [address]    Assessment:  Heather Dudley is a 59 y.o. female with stage IIA left breast cancer s/p wide excision and axillary lymph node dissection on 09/03/2010.  Pathology revealed a 1.0 cm grade II invasive ductal carcinoma. One of 14 lymph nodes were positive macrometastasis of 1.3 cm.  Pathologic stage was T1cN1bM0.  Tumor was ER positive (90%), PR positive (20%), and HER-2/neu negative by FISH (Her-2/CEP17 ratio < 1.8).  She enrolled on NSABP B-47. She received AC 4 followed by weekly paclitaxel (completed on 02/06/2011).  She received breast radiation in 03/2011.  She completed a year of adjuvant Herceptin on 11/13/2011. She was on Arimidex from 07/2011 - 07/2015 and Femara from 09/04/2015 - 10/09/2015.  She stopped Arimidex and Femara secondary to vasomotor symptoms.  She began Aromasin on 10/09/2015 (discontinued in 11/2015 or 12/2015). She had vasomotor symptoms while on adjuvant hormonal therapy unrelieved by Effexor.  She discontinued Effexor and declined extended adjuvant hormonal therapy.  Breast cancer index (BCI) testing on 10/08/2015 revealed a high risk of late recurrence  Risk  is estimated at 12.7% (CI: 7.4%-17.7%) during years 5-10.   She has a high likelihood of benefit from extended adjuvant hormonal therapy.  Bilateral mammogram on 12/10/2014 revealed lumpectomy changes in the left breast without evidence of malignancy.  Bilateral mammogram on 07/17/2016 revealed no evidence of malignancy.  Left sided mammogram and ultrasound on 04/22/2017 revealed postsurgical scar with dense postsurgical calcifications unchanged compared to prior exam. There was no suspicious abnormality.  Targeted  ultrasound showed postsurgical scar calcification in the palpable area lateral inferior portion of the left breast.  There was no suspicious mass is identified in the palpable area.  Bilateral diagnostic mammogram on 07/20/2017 revealed no mammographic evidence of breast malignancy.  Bilateral screening mammogram on 07/26/2018 revealed no mammographic evidence of breast malignancy, BI-RADS Category 1: Negative.  CA27.29 has been followed: 17.4 on 11/02/2013, 21 on 12/20/2014, 19.5 on 09/04/2015, 21.7 on 10/09/2015, 17.6 on 02/05/2016, 18.6 on 04/14/2017, and 17.6 on 04/27/2018.  05/01/2019-20.2.  Chest CT angiogram on 03/21/2017 revealed no evidence of pulmonary embolism.  There was a 2.3 cm  indeterminate lesion in the right lobe of the liver.  Abdomen and pelvic CT on 04/05/2017 revealed a 2.4 cm hypodensity over the right lobe of the liver unchanged in size from 2013 and compatible with a hemangioma.  There were a few small liver cysts were described.    CT chest with contrast on 08/19/2018 to follow-up regarding pulmonary nodule revealed stable 5 mm pulmonary nodule in the right lower lobe requires no follow-up.  Right-sided thyroid nodules were followed up with ultrasound and biopsy revealed benign.  Bone density study on 12/04/2015 was normal with a T-score of -0.9 in the AP spine L1-L4 and -0.6 in the left femoral neck.    She underwent bilateral tonsillectomy on 06/08/2016 at [facility] for obstructive sleep apnea.  She has microscopic hematuria.  Urinalysis on 07/01/2017 and 04/08/2018 revealed large RBCs.  Declined urology consult.  Continues to deny gross hematuria.  No history of nephrolithiasis.   Symptomatically, she is doing well and denies breast concerns.  Blood pressure elevated and she has recently discontinued blood pressure medication due to concerns of palpitations.  Breast exam today reveals stable postoperative left breast changes.  Labs overall unremarkable and CA 27-29 is  normal.  Plan: 1.   Labs today:  CBC with diff, CMP, CA27.29. 2.  Stage IIa left breast cancer  Clinically doing well and no evidence of recurrent disease today  Screening mammogram in 2020 showed no radiographic evidence of recurrent or new disease  CA 27.29 normal and stable today.   Unable to tolerate extended adjuvant hormonal therapies (risk of recurrence > 12% as above) due to side effects and elected to discontinue in 2017.   Discussed NCCN surveillance guidelines including annual mammograms and clinical breast exams  Plan to repeat mammogram in June 2021.  3. Bone density screening  Bone density screening in setting of treatment for breast cancer and hx of antihormonal therapy  Last DEXA scan October 2017 reported T-score of -0.9/normal  Encouraged calcium 1200 mg and vitamin d 800 iu supplementation daily and weight bearing exercise as tolerated.   Plan to repeat every 2 years. Can coordinate with next mammogram.  4. Microscopic hematuria  Asymptomatic. Resolved on May 2020 UA.   Declined referral to urology. Follow up with PCP if symptoms recur.  5. Hypertension  BP elevated in clinic today. Patient self discontinued medications due to side effects. Discussed end organ effect of hypertension. Encouraged her to  follow up with her PCP for ongoing management.   Disposition:  Screening mammogram and bone density scan in June 2021 RTC in 1 year for labs (cbc, cmp, ca 27.29)  Beckey Rutter, DNP, AGNP-C [place] at [facility] [phone] (clinic)

## 2019-05-04 NOTE — Progress Notes (Signed)
No new changes noted today 

## 2019-05-19 ENCOUNTER — Other Ambulatory Visit: Payer: Self-pay

## 2019-05-19 ENCOUNTER — Encounter: Payer: Self-pay | Admitting: Internal Medicine

## 2019-05-19 ENCOUNTER — Ambulatory Visit (INDEPENDENT_AMBULATORY_CARE_PROVIDER_SITE_OTHER): Payer: BC Managed Care – PPO | Admitting: Internal Medicine

## 2019-05-19 VITALS — BP 128/78 | HR 83 | Temp 97.5°F | Ht 67.0 in | Wt 245.0 lb

## 2019-05-19 DIAGNOSIS — I10 Essential (primary) hypertension: Secondary | ICD-10-CM | POA: Diagnosis not present

## 2019-05-19 DIAGNOSIS — I479 Paroxysmal tachycardia, unspecified: Secondary | ICD-10-CM

## 2019-05-19 DIAGNOSIS — F410 Panic disorder [episodic paroxysmal anxiety] without agoraphobia: Secondary | ICD-10-CM

## 2019-05-19 DIAGNOSIS — E042 Nontoxic multinodular goiter: Secondary | ICD-10-CM | POA: Diagnosis not present

## 2019-05-19 MED ORDER — LORAZEPAM 0.5 MG PO TABS: 0.5000 mg | ORAL_TABLET | Freq: Three times a day (TID) | ORAL | 0 refills | Status: DC | PRN

## 2019-05-19 NOTE — Progress Notes (Signed)
Date:  05/19/2019   Name:  Heather Dudley   DOB:  Jul 15, 1960   MRN:  [id number]   Chief Complaint: Hypertension (Follow up. )  Hypertension This is a chronic problem. The problem is uncontrolled (high at home when she feels bad). Associated symptoms include palpitations. Pertinent negatives include no chest pain, headaches, peripheral edema or shortness of breath. Past treatments include calcium channel blockers, beta blockers and diuretics. The current treatment provides significant improvement. There are no compliance problems.  There is no history of kidney disease or CAD/MI.  Palpitations  This is a chronic problem. The problem occurs daily. The problem has been gradually worsening. Pertinent negatives include no chest pain, dizziness, fever or shortness of breath. She has tried beta blockers (uses propranolol PRN) for the symptoms. The treatment provided significant relief.  Thyroid nodule - s/p subtotal thyroidectomy in the past;  Last year had larger nodule that was biopsied - benign.  Levothyroxine given to help reduce nodule size. Pt is tolerating well.  Lab Results  Component Value Date   CREATININE 0.99 05/01/2019   BUN 11 05/01/2019   NA 133 (L) 05/01/2019   K 3.6 05/01/2019   CL 96 (L) 05/01/2019   CO2 27 05/01/2019   Lab Results  Component Value Date   CHOL 290 (H) 07/05/2018   HDL 46 07/05/2018   LDLCALC 216 (H) 07/05/2018   TRIG 142 07/05/2018   CHOLHDL 6.3 (H) 07/05/2018   Lab Results  Component Value Date   TSH 3.990 07/05/2018   No results found for: HGBA1C Lab Results  Component Value Date   WBC 9.8 05/01/2019   HGB 12.9 05/01/2019   HCT 40.1 05/01/2019   MCV 87.9 05/01/2019   PLT 310 05/01/2019   Lab Results  Component Value Date   ALT 13 05/01/2019   AST 18 05/01/2019   ALKPHOS 68 05/01/2019   BILITOT 0.5 05/01/2019     Review of Systems  Constitutional: Negative for chills, fatigue and fever.  Respiratory: Negative for chest  tightness and shortness of breath.   Cardiovascular: Positive for palpitations. Negative for chest pain and leg swelling.  Gastrointestinal: Negative for constipation and diarrhea.  Neurological: Positive for light-headedness (sometimes after taking amlodipine). Negative for dizziness and headaches.    Patient Active Problem List   Diagnosis Date Noted  . Multiple thyroid nodules 08/23/2018  . Mixed hyperlipidemia 07/06/2018  . Microscopic hematuria 05/04/2018  . Paroxysmal tachycardia ([place]) 05/04/2017  . Liver hemangioma 03/26/2017  . Anxiety attack 06/15/2016  . Macroglossia 02/05/2016  . Environmental and seasonal allergies 01/02/2016  . Hot flashes related to aromatase inhibitor therapy 09/08/2015  . Vitamin D deficiency 04/19/2015  . Cervical radiculopathy 04/17/2015  . Essential hypertension 04/17/2015  . History of partial thyroidectomy 04/17/2015  . Obesity, Class II, BMI 35-39.9 04/17/2015  . Lipoma of axilla 10/24/2014  . History of left breast cancer 09/02/2010    Allergies  Allergen Reactions  . Ace Inhibitors Swelling  . Sernivo [Betamethasone Dipropionate] Itching  . Penicillins Rash    Has patient had a PCN reaction causing immediate rash, facial/tongue/throat swelling, SOB or lightheadedness with hypotension: Yes Has patient had a PCN reaction causing severe rash involving mucus membranes or skin necrosis: No Has patient had a PCN reaction that required hospitalization: No Has patient had a PCN reaction occurring within the last 10 years: No If all of the above answers are "NO", then may proceed with Cephalosporin use.    Past Surgical History:  Procedure Laterality Date  . ABDOMINAL HYSTERECTOMY  2011  . birth mark removed    . BREAST CYST ASPIRATION Right    negative 01/2010  . BREAST EXCISIONAL BIOPSY  August 05, 2010   left breast positive 07/2010  . BREAST LUMPECTOMY  2012   left breast  . COLONOSCOPY  July 29, 2012   normal study.  . robotic  tonsillectomy Lingual tonsils Bilateral 06/08/2016  . THYROID SURGERY     partially removed  . TUBAL LIGATION      Social History   Tobacco Use  . Smoking status: Never Smoker  . Smokeless tobacco: Never Used  Substance Use Topics  . Alcohol use: No    Alcohol/week: 0.0 standard drinks  . Drug use: No     Medication list has been reviewed and updated.  Current Meds  Medication Sig  . amLODipine (NORVASC) 5 MG tablet TAKE 1 TABLET BY MOUTH EVERY DAY  . B Complex-Biotin-FA (B-COMPLEX PO) Take 1 tablet by mouth daily.  . CHOLECALCIFEROL PO Take 4,000 Units by mouth daily.  . hydrochlorothiazide (HYDRODIURIL) 25 MG tablet Take 1 tablet (25 mg total) by mouth daily.  [name] LORazepam (ATIVAN) 0.5 MG tablet Take 1 tablet (0.5 mg total) by mouth every 8 (eight) hours as needed for anxiety.  . Multiple Vitamin (MULTIVITAMIN) tablet Take 1 tablet by mouth daily.  . potassium chloride SA (KLOR-CON) 20 MEQ tablet Take 2 tablets (40 mEq total) by mouth daily.  . propranolol (INDERAL) 20 MG tablet TAKE 1 TABLET (20 MG TOTAL) BY MOUTH 3 (THREE) TIMES DAILY AS NEEDED.    PHQ 2/9 Scores 05/19/2019 08/10/2018 07/05/2018 04/08/2018  PHQ - 2 Score 0 2 4 0  PHQ- 9 Score 0 7 6 -    BP Readings from Last 3 Encounters:  05/19/19 128/78  05/04/19 (!) 152/93  12/06/18 136/80    Physical Exam Vitals and nursing note reviewed.  Constitutional:      General: She is not in acute distress.    Appearance: She is well-developed.  HENT:     Head: Normocephalic and atraumatic.  Cardiovascular:     Rate and Rhythm: Normal rate and regular rhythm.  Pulmonary:     Effort: Pulmonary effort is normal. No respiratory distress.     Breath sounds: No wheezing or rhonchi.  Musculoskeletal:     Cervical back: Normal range of motion.     Right lower leg: No edema.     Left lower leg: No edema.  Lymphadenopathy:     Cervical: No cervical adenopathy.  Skin:    General: Skin is warm and dry.     Capillary  Refill: Capillary refill takes less than 2 seconds.     Findings: No rash.  Neurological:     General: No focal deficit present.     Mental Status: She is alert and oriented to person, place, and time.  Psychiatric:        Mood and Affect: Mood normal.        Behavior: Behavior normal.     Wt Readings from Last 3 Encounters:  05/19/19 245 lb (111.1 kg)  05/04/19 245 lb 7.7 oz (111.4 kg)  12/06/18 242 lb (109.8 kg)    BP 128/78   Pulse 83   Temp (!) 97.5 F (36.4 C) (Temporal)   Ht 5\' 7"  (1.702 m)   Wt 245 lb (111.1 kg)   LMP 04/01/2008 Comment: Hysterectomy was due to endometriosis. - still has ovaries.   SpO2  97%   BMI 38.37 kg/m   Assessment and Plan: 1. Essential hypertension Clinically stable exam with well controlled BP on amlodipine and hctz. Apparently increases with stress but unclear how long these episodes last Recommend check BP at home regularly and record Tolerating medications without side effects at this time. Pt to continue current regimen and low sodium diet; benefits of regular exercise as able discussed.  2. Multiple thyroid nodules S/p biopsy last year - benign Started on levothyroxine 25 mcg per day Doing well with minimal symptoms of compression  3. Paroxysmal tachycardia ([place]) Recommend a dairy to document frequency Discuss with cardiology next visit - consider daily ER beta blocker   Partially dictated using Editor, commissioning. Any errors are unintentional.  Halina Maidens, MD [facility]  05/19/2019

## 2019-05-19 NOTE — Patient Instructions (Signed)
DASH Eating Plan DASH stands for "Dietary Approaches to Stop Hypertension." The DASH eating plan is a healthy eating plan that has been shown to reduce high blood pressure (hypertension). It may also reduce your risk for type 2 diabetes, heart disease, and stroke. The DASH eating plan may also help with weight loss. What are tips for following this plan?  General guidelines  Avoid eating more than 2,300 mg (milligrams) of salt (sodium) a day. If you have hypertension, you may need to reduce your sodium intake to 1,500 mg a day.  Limit alcohol intake to no more than 1 drink a day for nonpregnant women and 2 drinks a day for men. One drink equals 12 oz of beer, 5 oz of wine, or 1 oz of hard liquor.  Work with your health care provider to maintain a healthy body weight or to lose weight. Ask what an ideal weight is for you.  Get at least 30 minutes of exercise that causes your heart to beat faster (aerobic exercise) most days of the week. Activities may include walking, swimming, or biking.  Work with your health care provider or diet and nutrition specialist (dietitian) to adjust your eating plan to your individual calorie needs. Reading food labels   Check food labels for the amount of sodium per serving. Choose foods with less than 5 percent of the Daily Value of sodium. Generally, foods with less than 300 mg of sodium per serving fit into this eating plan.  To find whole grains, look for the word "whole" as the first word in the ingredient list. Shopping  Buy products labeled as "low-sodium" or "no salt added."  Buy fresh foods. Avoid canned foods and premade or frozen meals. Cooking  Avoid adding salt when cooking. Use salt-free seasonings or herbs instead of table salt or sea salt. Check with your health care provider or pharmacist before using salt substitutes.  Do not fry foods. Cook foods using healthy methods such as baking, boiling, grilling, and broiling instead.  Cook with  heart-healthy oils, such as olive, canola, soybean, or sunflower oil. Meal planning  Eat a balanced diet that includes: ? 5 or more servings of fruits and vegetables each day. At each meal, try to fill half of your plate with fruits and vegetables. ? Up to 6-8 servings of whole grains each day. ? Less than 6 oz of lean meat, poultry, or fish each day. A 3-oz serving of meat is about the same size as a deck of cards. One egg equals 1 oz. ? 2 servings of low-fat dairy each day. ? A serving of nuts, seeds, or beans 5 times each week. ? Heart-healthy fats. Healthy fats called Omega-3 fatty acids are found in foods such as flaxseeds and coldwater fish, like sardines, salmon, and mackerel.  Limit how much you eat of the following: ? Canned or prepackaged foods. ? Food that is high in trans fat, such as fried foods. ? Food that is high in saturated fat, such as fatty meat. ? Sweets, desserts, sugary drinks, and other foods with added sugar. ? Full-fat dairy products.  Do not salt foods before eating.  Try to eat at least 2 vegetarian meals each week.  Eat more home-cooked food and less restaurant, buffet, and fast food.  When eating at a restaurant, ask that your food be prepared with less salt or no salt, if possible. What foods are recommended? The items listed may not be a complete list. Talk with your dietitian about   what dietary choices are best for you. Grains Whole-grain or whole-wheat bread. Whole-grain or whole-wheat pasta. Brown rice. Oatmeal. Quinoa. Bulgur. Whole-grain and low-sodium cereals. Pita bread. Low-fat, low-sodium crackers. Whole-wheat flour tortillas. Vegetables Fresh or frozen vegetables (raw, steamed, roasted, or grilled). Low-sodium or reduced-sodium tomato and vegetable juice. Low-sodium or reduced-sodium tomato sauce and tomato paste. Low-sodium or reduced-sodium canned vegetables. Fruits All fresh, dried, or frozen fruit. Canned fruit in natural juice (without  added sugar). Meat and other protein foods Skinless chicken or turkey. Ground chicken or turkey. Pork with fat trimmed off. Fish and seafood. Egg whites. Dried beans, peas, or lentils. Unsalted nuts, nut butters, and seeds. Unsalted canned beans. Lean cuts of beef with fat trimmed off. Low-sodium, lean deli meat. Dairy Low-fat (1%) or fat-free (skim) milk. Fat-free, low-fat, or reduced-fat cheeses. Nonfat, low-sodium ricotta or cottage cheese. Low-fat or nonfat yogurt. Low-fat, low-sodium cheese. Fats and oils Soft margarine without trans fats. Vegetable oil. Low-fat, reduced-fat, or light mayonnaise and salad dressings (reduced-sodium). Canola, safflower, olive, soybean, and sunflower oils. Avocado. Seasoning and other foods Herbs. Spices. Seasoning mixes without salt. Unsalted popcorn and pretzels. Fat-free sweets. What foods are not recommended? The items listed may not be a complete list. Talk with your dietitian about what dietary choices are best for you. Grains Baked goods made with fat, such as croissants, muffins, or some breads. Dry pasta or rice meal packs. Vegetables Creamed or fried vegetables. Vegetables in a cheese sauce. Regular canned vegetables (not low-sodium or reduced-sodium). Regular canned tomato sauce and paste (not low-sodium or reduced-sodium). Regular tomato and vegetable juice (not low-sodium or reduced-sodium). Pickles. Olives. Fruits Canned fruit in a light or heavy syrup. Fried fruit. Fruit in cream or butter sauce. Meat and other protein foods Fatty cuts of meat. Ribs. Fried meat. Bacon. Sausage. Bologna and other processed lunch meats. Salami. Fatback. Hotdogs. Bratwurst. Salted nuts and seeds. Canned beans with added salt. Canned or smoked fish. Whole eggs or egg yolks. Chicken or turkey with skin. Dairy Whole or 2% milk, cream, and half-and-half. Whole or full-fat cream cheese. Whole-fat or sweetened yogurt. Full-fat cheese. Nondairy creamers. Whipped toppings.  Processed cheese and cheese spreads. Fats and oils Butter. Stick margarine. Lard. Shortening. Ghee. Bacon fat. Tropical oils, such as coconut, palm kernel, or palm oil. Seasoning and other foods Salted popcorn and pretzels. Onion salt, garlic salt, seasoned salt, table salt, and sea salt. Worcestershire sauce. Tartar sauce. Barbecue sauce. Teriyaki sauce. Soy sauce, including reduced-sodium. Steak sauce. Canned and packaged gravies. Fish sauce. Oyster sauce. Cocktail sauce. Horseradish that you find on the shelf. Ketchup. Mustard. Meat flavorings and tenderizers. Bouillon cubes. Hot sauce and Tabasco sauce. Premade or packaged marinades. Premade or packaged taco seasonings. Relishes. Regular salad dressings. Where to find more information:  National Heart, Lung, and Blood Institute: www.nhlbi.nih.gov  American Heart Association: www.heart.org Summary  The DASH eating plan is a healthy eating plan that has been shown to reduce high blood pressure (hypertension). It may also reduce your risk for type 2 diabetes, heart disease, and stroke.  With the DASH eating plan, you should limit salt (sodium) intake to 2,300 mg a day. If you have hypertension, you may need to reduce your sodium intake to 1,500 mg a day.  When on the DASH eating plan, aim to eat more fresh fruits and vegetables, whole grains, lean proteins, low-fat dairy, and heart-healthy fats.  Work with your health care provider or diet and nutrition specialist (dietitian) to adjust your eating plan to your   individual calorie needs. This information is not intended to replace advice given to you by your health care provider. Make sure you discuss any questions you have with your health care provider. Document Revised: 01/22/2017 Document Reviewed: 02/03/2016 Elsevier Patient Education  2020 [employer].  

## 2019-05-22 ENCOUNTER — Other Ambulatory Visit: Payer: Self-pay | Admitting: Cardiovascular Disease

## 2019-05-22 MED ORDER — PROPRANOLOL HCL 20 MG PO TABS: 20.0000 mg | ORAL_TABLET | Freq: Three times a day (TID) | ORAL | 0 refills | Status: DC | PRN

## 2019-05-22 NOTE — Telephone Encounter (Signed)
Requested Prescriptions   Signed Prescriptions Disp Refills  . propranolol (INDERAL) 20 MG tablet 90 tablet 0    Sig: Take 1 tablet (20 mg total) by mouth 3 (three) times daily as needed.    Authorizing Provider: Minna Merritts    Ordering User: Britt Bottom

## 2019-05-22 NOTE — Telephone Encounter (Signed)
*  STAT* If patient is at the pharmacy, call can be transferred to refill team.   1. Which medications need to be refilled? (please list name of each medication and dose if known) propranolol 20 MG as needed  2. Which pharmacy/location (including street and city if local pharmacy) is medication to be sent to? [facility] in [place]   3. Do they need a 30 day or 90 day supply? 90 day

## 2019-05-23 ENCOUNTER — Encounter: Payer: Self-pay | Admitting: Family

## 2019-05-23 ENCOUNTER — Ambulatory Visit (INDEPENDENT_AMBULATORY_CARE_PROVIDER_SITE_OTHER): Payer: BC Managed Care – PPO | Admitting: Family

## 2019-05-23 ENCOUNTER — Ambulatory Visit (INDEPENDENT_AMBULATORY_CARE_PROVIDER_SITE_OTHER): Payer: BC Managed Care – PPO

## 2019-05-23 ENCOUNTER — Other Ambulatory Visit: Payer: Self-pay

## 2019-05-23 VITALS — BP 130/80 | HR 122 | Ht 67.0 in | Wt 245.2 lb

## 2019-05-23 DIAGNOSIS — I4891 Unspecified atrial fibrillation: Secondary | ICD-10-CM

## 2019-05-23 DIAGNOSIS — I10 Essential (primary) hypertension: Secondary | ICD-10-CM | POA: Diagnosis not present

## 2019-05-23 MED ORDER — METOPROLOL SUCCINATE ER 50 MG PO TB24: 50.0000 mg | ORAL_TABLET | Freq: Every day | ORAL | 4 refills | Status: DC

## 2019-05-23 MED ORDER — RIVAROXABAN 20 MG PO TABS: 20.0000 mg | ORAL_TABLET | Freq: Every day | ORAL | 4 refills | Status: DC

## 2019-05-23 NOTE — Patient Instructions (Addendum)
Medication Instructions:  Your physician has recommended you make the following change in your medication:   STOP Propranolol (only take for breakthrough and call us if you find you are taking it a lot)  START Metoprolol Succinate 50mg  daily  START Xarelto 20mg  daily with supper.  *If you need a refill on your cardiac medications before your next appointment, please call your pharmacy*   Lab Work: Your physician recommends that you return for lab work today: TSH  If you have labs (blood work) drawn today and your tests are completely normal, you will receive your results only by: [name] MyChart Message (if you have MyChart) OR . A paper copy in the mail If you have any lab test that is abnormal or we need to change your treatment, we will call you to review the results.  Testing/Procedures: Your EKG today showed atrial fibrillation and then normal sinus rhythm.   1- Your physician has requested that you have an echocardiogram. Echocardiography is a painless test that uses sound waves to create images of your heart. It provides your doctor with information about the size and shape of your heart and how well your heart's chambers and valves are working. This procedure takes approximately one hour. There are no restrictions for this procedure. You may get an IV, if needed, to receive an ultrasound enhancing agent through to better visualize your heart.    2- A ZIO was placed today. Please wear for 14 days.  Your physician has recommended that you wear a Zio monitor. This monitor is a medical device that records the heart's electrical activity. Doctors most often use these monitors to diagnose arrhythmias. Arrhythmias are problems with the speed or rhythm of the heartbeat. The monitor is a small device applied to your chest. You can wear one while you do your normal daily activities. While wearing this monitor if you have any symptoms to push the button and record what you felt. Once you have worn  this monitor for the period of time provider prescribed (Usually 14 days), you will return the monitor device in the postage paid box. Once it is returned they will download the data collected and provide [place] with a report which the provider will then review and we will call you with those results. Important tips:  1. Avoid showering during the first 24 hours of wearing the monitor. 2. Avoid excessive sweating to help maximize wear time. 3. Do not submerge the device, no hot tubs, and no swimming pools. 4. Keep any lotions or oils away from the patch. 5. After 24 hours you may shower with the patch on. Take brief showers with your back facing the shower head.  6. Do not remove patch once it has been placed because that will interrupt data and decrease adhesive wear time. 7. Push the button when you have any symptoms and write down what you were feeling. 8. Once you have completed wearing your monitor, remove and place into box which has postage paid and place in your outgoing mailbox.  9. If for some reason you have misplaced your box then call our office and we can provide another box and/or mail it off for you.  Follow-Up: At [facility], you and your health needs are our priority.  As part of our continuing mission to provide you with exceptional heart care, we have created designated Provider Care Teams.  These Care Teams include your primary Cardiologist (physician) and Advanced Practice Providers (APPs -  Physician Assistants and  Nurse Practitioners) who all work together to provide you with the care you need, when you need it.  We recommend signing up for the patient portal called "MyChart".  Sign up information is provided on this After Visit Summary.  MyChart is used to connect with patients for Virtual Visits (Telemedicine).  Patients are able to view lab/test results, encounter notes, upcoming appointments, etc.  Non-urgent messages can be sent to your provider as well.   To learn more  about what you can do with MyChart, go to [email or web address].    Your next appointment:   4 week(s)  The format for your next appointment:   In Person  Provider:    You may see Ida Rogue, MD or one of the following Advanced Practice Providers on your designated Care Team:    Murray Hodgkins, NP  Christell Faith, PA-C  Marrianne Mood, PA-C   Other Instructions   Atrial Fibrillation  Atrial fibrillation is a type of heartbeat that is irregular or fast. If you have this condition, your heart beats without any order. This makes it hard for your heart to pump blood in a normal way. Atrial fibrillation may come and go, or it may become a long-lasting problem. If this condition is not treated, it can put you at higher risk for stroke, heart failure, and other heart problems. What are the causes? This condition may be caused by diseases that damage the heart. They include:  High blood pressure.  Heart failure.  Heart valve disease.  Heart surgery. Other causes include:  Diabetes.  Thyroid disease.  Being overweight.  Kidney disease. Sometimes the cause is not known. What increases the risk? You are more likely to develop this condition if:  You are older.  You smoke.  You exercise often and very hard.  You have a family history of this condition.  You are a man.  You use drugs.  You drink a lot of alcohol.  You have lung conditions, such as emphysema, pneumonia, or COPD.  You have sleep apnea. What are the signs or symptoms? Common symptoms of this condition include:  A feeling that your heart is beating very fast.  Chest pain or discomfort.  Feeling short of breath.  Suddenly feeling light-headed or weak.  Getting tired easily during activity.  Fainting.  Sweating. In some cases, there are no symptoms. How is this treated? Treatment for this condition depends on underlying conditions and how you feel when you have atrial  fibrillation. They include:  Medicines to: ? Prevent blood clots. ? Treat heart rate or heart rhythm problems.  Using devices, such as a pacemaker, to correct heart rhythm problems.  Doing surgery to remove the part of the heart that sends bad signals.  Closing an area where clots can form in the heart (left atrial appendage). In some cases, your doctor will treat other underlying conditions. Follow these instructions at home: Medicines  Take over-the-counter and prescription medicines only as told by your doctor.  Do not take any new medicines without first talking to your doctor.  If you are taking blood thinners: ? Talk with your doctor before you take any medicines that have aspirin or NSAIDs, such as ibuprofen, in them. ? Take your medicine exactly as told by your doctor. Take it at the same time each day. ? Avoid activities that could hurt or bruise you. Follow instructions about how to prevent falls. ? Wear a bracelet that says you are taking blood thinners. Or, carry a  card that lists what medicines you take. Lifestyle      Do not use any products that have nicotine or tobacco in them. These include cigarettes, e-cigarettes, and chewing tobacco. If you need help quitting, ask your doctor.  Eat heart-healthy foods. Talk with your doctor about the right eating plan for you.  Exercise regularly as told by your doctor.  Do not drink alcohol.  Lose weight if you are overweight.  Do not use drugs, including cannabis. General instructions  If you have a condition that causes breathing to stop for a short period of time (apnea), treat it as told by your doctor.  Keep a healthy weight. Do not use diet pills unless your doctor says they are safe for you. Diet pills may make heart problems worse.  Keep all follow-up visits as told by your doctor. This is important. Contact a doctor if:  You notice a change in the speed, rhythm, or strength of your heartbeat.  You are  taking a blood-thinning medicine and you get more bruising.  You get tired more easily when you move or exercise.  You have a sudden change in weight. Get help right away if:   You have pain in your chest or your belly (abdomen).  You have trouble breathing.  You have side effects of blood thinners, such as blood in your vomit, poop (stool), or pee (urine), or bleeding that cannot stop.  You have any signs of a stroke. "BE FAST" is an easy way to remember the main warning signs: ? B - Balance. Signs are dizziness, sudden trouble walking, or loss of balance. ? E - Eyes. Signs are trouble seeing or a change in how you see. ? F - Face. Signs are sudden weakness or loss of feeling in the face, or the face or eyelid drooping on one side. ? A - Arms. Signs are weakness or loss of feeling in an arm. This happens suddenly and usually on one side of the body. ? S - Speech. Signs are sudden trouble speaking, slurred speech, or trouble understanding what people say. ? T - Time. Time to call emergency services. Write down what time symptoms started.  You have other signs of a stroke, such as: ? A sudden, very bad headache with no known cause. ? Feeling like you may vomit (nausea). ? Vomiting. ? A seizure. These symptoms may be an emergency. Do not wait to see if the symptoms will go away. Get medical help right away. Call your local emergency services (911 in the [place] Do not drive yourself to the hospital. Summary  Atrial fibrillation is a type of heartbeat that is irregular or fast.  You are at higher risk of this condition if you smoke, are older, have diabetes, or are overweight.  Follow your doctor's instructions about medicines, diet, exercise, and follow-up visits.  Get help right away if you have signs or symptoms of a stroke.  Get help right away if you cannot catch your breath, or you have chest pain or discomfort. This information is not intended to replace advice given to you  by your health care provider. Make sure you discuss any questions you have with your health care provider. Document Revised: 08/03/2018 Document Reviewed: 08/03/2018 Elsevier Patient Education  [place].

## 2019-05-23 NOTE — Progress Notes (Signed)
Office Visit    Patient Name: Heather Dudley Date of Encounter: 05/23/2019  Primary Care Provider:  Glean Hess, MD Primary Cardiologist:  Ida Rogue, MD Electrophysiologist:  None   Chief Complaint    Heather Dudley is a 59 y.o. female with a hx of palpitations, paroxysmal tachycardia, HTN, breast cancer s/p chemo and radiation presents today for palpitations   Past Medical History    Past Medical History:  Diagnosis Date  . Basal cell carcinoma of forehead    birthmark of forehead  . Breast cancer ([place])   . Breast screening, unspecified   . Cellulitis and abscess of trunk   . Hernia   . History of chemotherapy 2012  . Hypertension   . Lump or mass in breast   . Malignant neoplasm of breast (female), unspecified site    She underwent wide excision, mastoplasty and axillary dissection for her left breast malignancy on September 03, 2010. The primary tumor was 1 cm in diameter, and a single positive axillary node 1.3 cm in diameter. This was an ER-positive, PR slightly positive, HER-2/neu not overexpressing tumor. She tolerated her whole breast radiation without difficulty ending in late February 2013.  . Malignant neoplasm of upper-outer quadrant of female breast ([place])   . 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 Obesity, unspecified   . Personal history of chemotherapy   . Personal history of malignant neoplasm of breast    The patient underwent wide excision, mastoplasty. Dissection for a T1b, N1, M0 carcinoma left breast on September 03, 2010.The primary tumor was 1 cm in diameter, and a single positive axillary node 1.3 cm in diameter. This was an ER-positive, PR slightly positive, HER-2/neu not overexpressing tumor.  The patient completed whole breast radiation in February 2013.  [name] Personal history of radiation therapy 2013   LEFT lumpectomy  . Screening for obesity   . Thyroid nodule    Past Surgical History:  Procedure  Laterality Date  . ABDOMINAL HYSTERECTOMY  2011  . birth mark removed    . BREAST CYST ASPIRATION Right    negative 01/2010  . BREAST EXCISIONAL BIOPSY  August 05, 2010   left breast positive 07/2010  . BREAST LUMPECTOMY  2012   left breast  . COLONOSCOPY  July 29, 2012   normal study.  . robotic tonsillectomy Lingual tonsils Bilateral 06/08/2016  . THYROID SURGERY     partially removed  . TUBAL LIGATION      Allergies  Allergies  Allergen Reactions  . Ace Inhibitors Swelling  . Sernivo [Betamethasone Dipropionate] Itching  . Penicillins Rash    Has patient had a PCN reaction causing immediate rash, facial/tongue/throat swelling, SOB or lightheadedness with hypotension: Yes Has patient had a PCN reaction causing severe rash involving mucus membranes or skin necrosis: No Has patient had a PCN reaction that required hospitalization: No Has patient had a PCN reaction occurring within the last 10 years: No If all of the above answers are "NO", then may proceed with Cephalosporin use.    History of Present Illness    Heather Dudley is a 59 y.o. female with a hx of thyroid nodule s/p subtotal thyroidectomy, palpitations, paroxysmal tachycardia, breast cancer, HTN last seen by Dr. Rockey Situ 12/06/18  Works from home in [employer].   Reports more palpitations over the last month. She has been using her propranolol intermittently and notices using it more and more frequently. She will take Propranolol 10  minutes after palpitations begin and tells me it takes up to 30 minutes to work. Sometimes she will lay down and rest to relieve palpitations. Tells me she notices palpitations more in the evening if she eats shortly before laying down. Did notice palpitations during her initial EKG today. Not associated with shortness of breath or dyspnea.   Takes HCTZ in the afternoon. Takes her amlodipine in the morning. Has a BP cuff at home and checks it only if she feels poorly. No recent  checks..   Tells me she cut back on caffeine significantly. Takes no over the counter medications regularly. Does not smoke nor drink alcohol.   07/05/18 her TSH was 3.99. Follows with [facility] regarding her thyroid nodules and levothyroxine. Reports no recent fatigue, changes in temperature regulation.   Family history notable for dad had a "bad heart" (heart disease). Mom and mom's brother have enlarged hearts. No known family history of arrhythmia.   Noted new onset atrial fibrillation with RVR rate 122 during office visit today.   EKGs/Labs/Other Studies Reviewed:   The following studies were reviewed today:  EKG:  EKG is  ordered today.  The initial ekg ordered today demonstrates atrial fibrillation with RVR rate 122 bpm. Repeat EKG 30 seconds later shows restoration of NSR rate 86 bpm with left axis deviation and possible LA enlargement.   Recent Labs: 07/05/2018: TSH 3.990 05/01/2019: ALT 13; BUN 11; Creatinine, Ser 0.99; Hemoglobin 12.9; Platelets 310; Potassium 3.6; Sodium 133  Recent Lipid Panel    Component Value Date/Time   CHOL 290 (H) 07/05/2018 1115   TRIG 142 07/05/2018 1115   HDL 46 07/05/2018 1115   CHOLHDL 6.3 (H) 07/05/2018 1115   LDLCALC 216 (H) 07/05/2018 1115    Home Medications   Current Meds  Medication Sig  . amLODipine (NORVASC) 5 MG tablet TAKE 1 TABLET BY MOUTH EVERY DAY  . B Complex-Biotin-FA (B-COMPLEX PO) Take 1 tablet by mouth daily.  . CHOLECALCIFEROL PO Take 4,000 Units by mouth daily.  . hydrochlorothiazide (HYDRODIURIL) 25 MG tablet Take 1 tablet (25 mg total) by mouth daily.  [name] levothyroxine (SYNTHROID) 25 MCG tablet Take 25 mcg by mouth daily before breakfast.  . LORazepam (ATIVAN) 0.5 MG tablet Take 1 tablet (0.5 mg total) by mouth every 8 (eight) hours as needed for anxiety.  . Multiple Vitamin (MULTIVITAMIN) tablet Take 1 tablet by mouth daily.  . potassium chloride SA (KLOR-CON) 20 MEQ tablet Take 2 tablets (40 mEq total) by mouth daily.  .  propranolol (INDERAL) 20 MG tablet Take 1 tablet (20 mg total) by mouth 3 (three) times daily as needed.      Review of Systems       Review of Systems  Constitution: Negative for chills, fever and malaise/fatigue.  Cardiovascular: Positive for irregular heartbeat and palpitations. Negative for chest pain, dyspnea on exertion, leg swelling, near-syncope, orthopnea and syncope.  Respiratory: Negative for cough, shortness of breath and wheezing.   Gastrointestinal: Negative for melena, nausea and vomiting.  Genitourinary: Negative for hematuria.  Neurological: Negative for dizziness, light-headedness and weakness.   All other systems reviewed and are otherwise negative except as noted above.  Physical Exam    VS:  BP 130/80 (BP Location: Left Arm, Patient Position: Sitting, Cuff Size: Normal)   Pulse (!) 122   Ht _0  (1.702 m)   Wt 245 lb 4 oz (111.2 kg)   LMP 04/01/2008 Comment: Hysterectomy was due to endometriosis. - still has ovaries.  SpO2 98%   BMI 38.41 kg/m  , BMI Body mass index is 38.41 kg/m. GEN: Well nourished, overweight, well developed, in no acute distress. HEENT: normal. Neck: Supple, no JVD, carotid bruits, or masses. Cardiac: RRR, no murmurs, rubs, or gallops. No clubbing, cyanosis, edema.  Radials/DP/PT 2+ and equal bilaterally.  Respiratory:  Respirations regular and unlabored, clear to auscultation bilaterally. GI: Soft, nontender, nondistended, BS + x 4. MS: No deformity or atrophy. Skin: Warm and dry, no rash. Neuro:  Strength and sensation are intact. Psych: Normal affect.  Accessory Clinical Findings    ECG personally reviewed by me today -  EKG is  ordered today.  The initial ekg ordered today demonstrates atrial fibrillation with RVR rate 122 bpm. This is a new finding. Repeat EKG 30 seconds later shows restoration of NSR rate 86 bpm with left axis deviation and possible LA enlargement.   Assessment & Plan    1. Atrial fibrillation with  RVR/Palpitations - Initial EKG today noted atrial fibrillation RVR rate 122 bpm. Repeat EKG 30 seconds later with restoration of NSR 86 bpm. New diagnosis of atrial fib reviewed in depth. Anticipate this it the etiology of increased palpitations over the last month. Her atrial fibrillation is symptomatic and as such, will attempt to keep her in NSR. Case reviewed with Dr. Rockey Situ in office.   Start Xarelto 92m daily for CHADS2VASC of at least 2 (HTN, female). Reviewed need for stroke prevention via anticoagulation in atrial fibrillation. Opted for Xarelto as once daily dosage is easier for her. Repeat CBC at follow up in 1 month.   Start Metoprolol Succinate 534mdaily for prevention of recurrent atrial fibrillation.   14 day ZIO place in office for evaluation of rate/rhythm control on Toprol.   Echocardiogram to assess atrium size and rule out valvular abnormality as etiology of atrial fibrillation.   TSH today to rule out hyperthyroidism as etiology. She had recent labs with her oncologist showing CBC and electrolytes were normal.   Atrial fib triggers: No known family history. No smoking/tobacco. Reports no recent stress. Drinks minimal caffeine. Takes no prescribed nor OTC proarrthymic medications. Known OSA by sleep study 2018, will need to assess CPAP compliance at follow up as could be contributory to atrial fibrillation.   2. HTN - BP well controlled. Continue present antihypertensive regimen including Amlodipine 60m260maily, HCTZ 260m360mily.  3. Thyroid nodules on Levothyroxine - Follows with ENT. TSH today as last checked 06/2018 to rule out as etiology of atrial fibrillation.   4. Hypokalemia - Noted history. Recent potassium normal. Continue potassium supplementation.   Disposition: TSH today. Start Toprol and Xarelto. Echo ordered. Follow up in 4 week(s) with Dr. GollRockey SituAPP.    CaitLoel Dubonnet 05/23/2019, 3:26 PM

## 2019-05-24 LAB — TSH: TSH: 2.06 u[IU]/mL (ref 0.450–4.500)

## 2019-05-25 ENCOUNTER — Other Ambulatory Visit: Payer: BC Managed Care – PPO

## 2019-06-06 ENCOUNTER — Other Ambulatory Visit: Payer: BC Managed Care – PPO

## 2019-06-13 ENCOUNTER — Other Ambulatory Visit: Payer: Self-pay | Admitting: Cardiovascular Disease

## 2019-06-13 ENCOUNTER — Other Ambulatory Visit: Payer: Self-pay

## 2019-06-13 ENCOUNTER — Ambulatory Visit (INDEPENDENT_AMBULATORY_CARE_PROVIDER_SITE_OTHER): Payer: BC Managed Care – PPO

## 2019-06-13 DIAGNOSIS — I4891 Unspecified atrial fibrillation: Secondary | ICD-10-CM

## 2019-06-13 MED ORDER — PERFLUTREN LIPID MICROSPHERE
1.0000 mL | INTRAVENOUS | Status: AC | PRN
  Administered 2019-06-13: 2 mL via INTRAVENOUS

## 2019-06-19 NOTE — Progress Notes (Signed)
Cardiology Office Note  Date:  06/20/2019   ID:  Heather Dudley, DOB 10/30/1960, MRN [id number]  PCP:  Glean Hess, MD   Chief Complaint  Patient presents with  . other    4 week follow up and discuss Echo and Zio monitor results. Meds reviewed by the pt. verbally. Pt. c/o several episodes of rapid heart beats at times.     HPI:  Heather Dudley is a 59 y.o. female  HTN Nonsmoker No diabetes History of breast cancer on the left, lumpectomy and lymph node dissection.  Completed chemotherapy remote chest pain Dx with CPAP, not on CPAP, 1-2 years ago Presents for f/u of her chest pain, tachycardia  Irregular rhythm over past few years Getting worse recently, was seen in clinic 1 month ago Atrial fibrillation seen on EKG  Echocardiogram and event monitor ordered, started on Xarelto and metoprolol  Echo reviewed with her in detail Left ventricular ejection fraction, by estimation, is 55 to 60%. The  left ventricle has normal function Left ventricular diastolic parameters  are consistent with Grade II diastolic dysfunction (pseudonormalization).  moderately elevated pulmonary artery systolic pressure.  dilated in size with <50% respiratory variability, suggesting right atrial pressure of 15 mmHg.   Monitor avg HR of 79 bpm. Predominant underlying rhythm was Sinus Rhythm. Atrial Fibrillation occurred (2% burden), ranging from 73-175 bpm (avg of 115 bpm), the longest lasting 52 mins 28 secs with an avg rate of 118 bpm. Atrial Fibrillation was detected within +/- 45 seconds of symptomatic patient event(s). Isolated SVEs were rare (<1.0%), SVE Couplets were rare (<1.0%), and SVE Triplets were rare (<1.0%). Isolated VEs were rare (<1.0%), and no VE Couplets or VE Triplets were present.  Still with occasional breakthrough tachypalpitations  Long discussion concerning her sleep apnea Previous diagnosis, was told it was not serious and did not choose to be on CPAP at that  time  Cholesterol 220 now is 290  CT chest: Images reviewed No significant coronary calcified atherosclerosis or aortic plaque  EKG personally reviewed by myself on todays visit Shows normal sinus rhythm rate 67 bpm poor R wave progression through the anterior precordial leads, left axis deviation  Other past medical history reviewed  March 21, 2017 she awoke from sleep with tachycardia  tightness in her chest Went to the emergency room, work-up negative  History of lumpectomy and lymph node dissection.  Sometimes this causes discomfort in her left arm  PMH:   has a past medical history of Basal cell carcinoma of forehead, Breast cancer ([place]), Breast screening, unspecified, Cellulitis and abscess of trunk, Hernia, History of chemotherapy (2012), Hypertension, Lump or mass in breast, Malignant neoplasm of breast (female), unspecified site, Malignant neoplasm of upper-outer quadrant of female breast ([place]), Neoplasm of uncertain behavior of connective and other soft tissue, Obesity, unspecified, Personal history of chemotherapy, Personal history of malignant neoplasm of breast, Personal history of radiation therapy (2013), Screening for obesity, and Thyroid nodule.  PSH:    Past Surgical History:  Procedure Laterality Date  . ABDOMINAL HYSTERECTOMY  2011  . birth mark removed    . BREAST CYST ASPIRATION Right    negative 01/2010  . BREAST EXCISIONAL BIOPSY  August 05, 2010   left breast positive 07/2010  . BREAST LUMPECTOMY  2012   left breast  . COLONOSCOPY  July 29, 2012   normal study.  . robotic tonsillectomy Lingual tonsils Bilateral 06/08/2016  . THYROID SURGERY     partially removed  .  TUBAL LIGATION      Current Outpatient Medications  Medication Sig Dispense Refill  . amLODipine (NORVASC) 5 MG tablet TAKE 1 TABLET BY MOUTH EVERY DAY 90 tablet 1  . B Complex-Biotin-FA (B-COMPLEX PO) Take 1 tablet by mouth daily.    . CHOLECALCIFEROL PO Take 4,000 Units by mouth daily.     . hydrochlorothiazide (HYDRODIURIL) 25 MG tablet Take 1 tablet (25 mg total) by mouth daily. 90 tablet 3  . levothyroxine (SYNTHROID) 25 MCG tablet Take 25 mcg by mouth daily before breakfast.    . LORazepam (ATIVAN) 0.5 MG tablet Take 1 tablet (0.5 mg total) by mouth every 8 (eight) hours as needed for anxiety. 20 tablet 0  . metoprolol succinate (TOPROL-XL) 50 MG 24 hr tablet Take 1 tablet (50 mg total) by mouth daily. Take with or immediately following a meal. 30 tablet 4  . Multiple Vitamin (MULTIVITAMIN) tablet Take 1 tablet by mouth daily.    . potassium chloride SA (KLOR-CON) 20 MEQ tablet Take 2 tablets (40 mEq total) by mouth daily. 180 tablet 3  . propranolol (INDERAL) 20 MG tablet Take 1 tablet (20 mg total) by mouth 3 (three) times daily as needed. 90 tablet 0  . rivaroxaban (XARELTO) 20 MG TABS tablet Take 1 tablet (20 mg total) by mouth daily with supper. 30 tablet 4   No current facility-administered medications for this visit.     Allergies:   Ace inhibitors, Sernivo [betamethasone dipropionate], and Penicillins   Social History:  The patient  reports that she has never smoked. She has never used smokeless tobacco. She reports that she does not drink alcohol or use drugs.   Family History:   family history includes Cancer in her cousin, maternal uncle, mother, and paternal aunt; Colon cancer in an other family member; Heart failure in her father; Ovarian cancer in an other family member.    Review of Systems: Review of Systems  Constitutional: Negative.   HENT: Negative.   Respiratory: Negative.   Cardiovascular: Negative.   Gastrointestinal: Negative.   Musculoskeletal: Negative.   Neurological: Negative.   Psychiatric/Behavioral: Negative.   All other systems reviewed and are negative.   PHYSICAL EXAM: VS:  BP 120/80 (BP Location: Right Arm, Patient Position: Sitting, Cuff Size: Large)   Pulse 67   Ht 5\' 7"  (1.702 m)   Wt 240 lb (108.9 kg)   LMP 04/01/2008  Comment: Hysterectomy was due to endometriosis. - still has ovaries.   SpO2 98%   BMI 37.59 kg/m  , BMI Body mass index is 37.59 kg/m. GEN: Well nourished, well developed, in no acute distress  HEENT: normal  Neck: no JVD, carotid bruits, or masses Cardiac: RRR; no murmurs, rubs, or gallops,no edema  Respiratory:  clear to auscultation bilaterally, normal work of breathing GI: soft, nontender, nondistended, + BS MS: no deformity or atrophy  Skin: warm and dry, no rash Neuro:  Strength and sensation are intact Psych: euthymic mood, full affect   Recent Labs: 05/01/2019: ALT 13; BUN 11; Creatinine, Ser 0.99; Hemoglobin 12.9; Platelets 310; Potassium 3.6; Sodium 133 05/23/2019: TSH 2.060    Lipid Panel Lab Results  Component Value Date   CHOL 290 (H) 07/05/2018   HDL 46 07/05/2018   LDLCALC 216 (H) 07/05/2018   TRIG 142 07/05/2018     Wt Readings from Last 3 Encounters:  06/20/19 240 lb (108.9 kg)  05/23/19 245 lb 4 oz (111.2 kg)  05/19/19 245 lb (111.1 kg)  ASSESSMENT AND PLAN:  Paroxysmal atrial fibrillation Recommended increasing metoprolol up to 75 mg daily HCTZ down to 12.5 mg daily Potassium 20 daily  Chest pain, unspecified type - Plan: EKG 12-Lead Atypical in nature, CT scan chest reviewed no coronary calcification, no aortic atherosclerosis No further work-up  Hyperlipidemia, mixed Discussed elevated cholesterol again, She prefers no medication at this time  Essential hypertension - Plan: EKG 12-Lead Metoprolol increased as above, down on the HCTZ Will need to monitor sodium and potassium  Malignant neoplasm of left breast in female, estrogen receptor positive, unspecified site of breast ([place]) Previous chemotherapy and radiation on the left Lymphedema left arm  Morbid obesity We have encouraged continued exercise, careful diet management in an effort to lose weight.   Disposition:   F/U 6 months   Total encounter time more than 25 minutes   Greater than 50% was spent in counseling and coordination of care with the patient   Orders Placed This Encounter  Procedures  . EKG 12-Lead     Signed, Esmond Plants, M.D., Ph.D. 06/20/2019  [place], [place]

## 2019-06-20 ENCOUNTER — Ambulatory Visit (INDEPENDENT_AMBULATORY_CARE_PROVIDER_SITE_OTHER): Payer: BC Managed Care – PPO | Admitting: Cardiovascular Disease

## 2019-06-20 ENCOUNTER — Other Ambulatory Visit: Payer: Self-pay

## 2019-06-20 ENCOUNTER — Encounter: Payer: Self-pay | Admitting: Cardiovascular Disease

## 2019-06-20 VITALS — BP 120/80 | HR 67 | Ht 67.0 in | Wt 240.0 lb

## 2019-06-20 DIAGNOSIS — I10 Essential (primary) hypertension: Secondary | ICD-10-CM

## 2019-06-20 DIAGNOSIS — I479 Paroxysmal tachycardia, unspecified: Secondary | ICD-10-CM | POA: Diagnosis not present

## 2019-06-20 DIAGNOSIS — R079 Chest pain, unspecified: Secondary | ICD-10-CM

## 2019-06-20 DIAGNOSIS — I4891 Unspecified atrial fibrillation: Secondary | ICD-10-CM

## 2019-06-20 DIAGNOSIS — E782 Mixed hyperlipidemia: Secondary | ICD-10-CM

## 2019-06-20 MED ORDER — POTASSIUM CHLORIDE CRYS ER 20 MEQ PO TBCR: 20.0000 meq | EXTENDED_RELEASE_TABLET | Freq: Every day | ORAL | 3 refills | Status: DC

## 2019-06-20 MED ORDER — METOPROLOL SUCCINATE ER 50 MG PO TB24: 75.0000 mg | ORAL_TABLET | Freq: Every day | ORAL | 3 refills | Status: DC

## 2019-06-20 MED ORDER — HYDROCHLOROTHIAZIDE 12.5 MG PO CAPS: 12.5000 mg | ORAL_CAPSULE | Freq: Every day | ORAL | 3 refills | Status: DC

## 2019-06-20 NOTE — Patient Instructions (Addendum)
Medication Instructions:  Your physician has recommended you make the following change in your medication:  1. DECREASE Hydrochlorothiazide 12.5 mg once daily 2. DECREASE Potassium 20 mEq once daily 3. INCREASE Metoprolol 50 mg to 1 & 1/2 tablet (75 mg) once daily   If you need a refill on your cardiac medications before your next appointment, please call your pharmacy.    Lab work: No new labs needed   If you have labs (blood work) drawn today and your tests are completely normal, you will receive your results only by: [name] MyChart Message (if you have MyChart) OR . A paper copy in the mail If you have any lab test that is abnormal or we need to change your treatment, we will call you to review the results.   Testing/Procedures: No new testing needed   Follow-Up: At [facility], you and your health needs are our priority.  As part of our continuing mission to provide you with exceptional heart care, we have created designated Provider Care Teams.  These Care Teams include your primary Cardiologist (physician) and Advanced Practice Providers (APPs -  Physician Assistants and Nurse Practitioners) who all work together to provide you with the care you need, when you need it.  . You will need a follow up appointment in 6 months   . Providers on your designated Care Team:   . Murray Hodgkins, NP . Christell Faith, PA-C . Marrianne Mood, PA-C  Any Other Special Instructions Will Be Listed Below (If Applicable).  For educational health videos Log in to : [email or web address] Or : [email or web address], password : [email or web address]   How to Take Your Blood Pressure You can take your blood pressure at home with a machine. You may need to check your blood pressure at home:  To check if you have high blood pressure (hypertension).  To check your blood pressure over time.  To make sure your blood pressure medicine is working. Supplies needed: You will need a blood pressure machine, or monitor. You can  buy one at a drugstore or online. When choosing one:  Choose one with an arm cuff.  Choose one that wraps around your upper arm. Only one finger should fit between your arm and the cuff.  Do not choose one that measures your blood pressure from your wrist or finger. Your doctor can suggest a monitor. How to prepare Avoid these things for 30 minutes before checking your blood pressure:  Drinking caffeine.  Drinking alcohol.  Eating.  Smoking.  Exercising. Five minutes before checking your blood pressure:  Pee.  Sit in a dining chair. Avoid sitting in a soft couch or armchair.  Be quiet. Do not talk. How to take your blood pressure Follow the instructions that came with your machine. If you have a digital blood pressure monitor, these may be the instructions: 1. Sit up straight. 2. Place your feet on the floor. Do not cross your ankles or legs. 3. Rest your left arm at the level of your heart. You may rest it on a table, desk, or chair. 4. Pull up your shirt sleeve. 5. Wrap the blood pressure cuff around the upper part of your left arm. The cuff should be 1 inch (2.5 cm) above your elbow. It is best to wrap the cuff around bare skin. 6. Fit the cuff snugly around your arm. You should be able to place only one finger between the cuff and your arm. 7. Put the cord inside the groove of your elbow. 8.  Press the power button. 9. Sit quietly while the cuff fills with air and loses air. 10. Write down the numbers on the screen. 11. Wait 2-3 minutes and then repeat steps 1-10. What do the numbers mean? Two numbers make up your blood pressure. The first number is called systolic pressure. The second is called diastolic pressure. An example of a blood pressure reading is "120 over 80" (or 120/80). If you are an adult and do not have a medical condition, use this guide to find out if your blood pressure is normal: Normal  First number: below 120.  Second number: below  80. Elevated  First number: 120-129.  Second number: below 80. Hypertension stage 1  First number: 130-139.  Second number: 80-89. Hypertension stage 2  First number: 140 or above.  Second number: 22 or above. Your blood pressure is above normal even if only the top or bottom number is above normal. Follow these instructions at home:  Check your blood pressure as often as your doctor tells you to.  Take your monitor to your next doctor's appointment. Your doctor will: ? Make sure you are using it correctly. ? Make sure it is working right.  Make sure you understand what your blood pressure numbers should be.  Tell your doctor if your medicines are causing side effects. Contact a doctor if:  Your blood pressure keeps being high. Get help right away if:  Your first blood pressure number is higher than 180.  Your second blood pressure number is higher than 120. This information is not intended to replace advice given to you by your health care provider. Make sure you discuss any questions you have with your health care provider. Document Revised: 01/22/2017 Document Reviewed: 07/19/2015 Elsevier Patient Education  2020 [employer].

## 2019-07-07 ENCOUNTER — Encounter: Payer: 59 | Admitting: Internal Medicine

## 2019-07-11 ENCOUNTER — Other Ambulatory Visit: Payer: Self-pay

## 2019-07-11 ENCOUNTER — Ambulatory Visit (INDEPENDENT_AMBULATORY_CARE_PROVIDER_SITE_OTHER): Payer: BC Managed Care – PPO | Admitting: Internal Medicine

## 2019-07-11 ENCOUNTER — Encounter: Payer: Self-pay | Admitting: Internal Medicine

## 2019-07-11 VITALS — BP 126/78 | HR 76 | Ht 67.0 in | Wt 243.0 lb

## 2019-07-11 DIAGNOSIS — Z0000 Encounter for general adult medical examination without abnormal findings: Secondary | ICD-10-CM

## 2019-07-11 DIAGNOSIS — Z853 Personal history of malignant neoplasm of breast: Secondary | ICD-10-CM

## 2019-07-11 DIAGNOSIS — E782 Mixed hyperlipidemia: Secondary | ICD-10-CM

## 2019-07-11 DIAGNOSIS — I480 Paroxysmal atrial fibrillation: Secondary | ICD-10-CM | POA: Diagnosis not present

## 2019-07-11 DIAGNOSIS — E042 Nontoxic multinodular goiter: Secondary | ICD-10-CM

## 2019-07-11 DIAGNOSIS — I10 Essential (primary) hypertension: Secondary | ICD-10-CM

## 2019-07-11 DIAGNOSIS — G4730 Sleep apnea, unspecified: Secondary | ICD-10-CM

## 2019-07-11 LAB — POCT URINALYSIS DIPSTICK
Glucose, UA: NEGATIVE
Ketones, UA: NEGATIVE
Leukocytes, UA: NEGATIVE
Nitrite, UA: NEGATIVE
Protein, UA: POSITIVE — AB
Spec Grav, UA: 1.01 (ref 1.010–1.025)
Urobilinogen, UA: 0.2 E.U./dL
pH, UA: 6 (ref 5.0–8.0)

## 2019-07-11 MED ORDER — AMLODIPINE BESYLATE 5 MG PO TABS: 5.0000 mg | ORAL_TABLET | Freq: Every day | ORAL | 1 refills | Status: DC

## 2019-07-11 NOTE — Progress Notes (Signed)
Date:  07/11/2019   Name:  Heather Dudley   DOB:  07/13/1960   MRN:  [id number]   Chief Complaint: Annual Exam Heather Dudley is a 59 y.o. female who presents today for her Complete Annual Exam. She feels well. She reports exercising walking regularly. She reports she is sleeping poorly for years. Usually gets 5-7 hours per night.  Mammogram - scheduled DEXA - scheduled Pap discontinued Colonoscopy 07/2012 Immunization History  Administered Date(s) Administered  . PFIZER SARS-COV-2 Vaccination 06/24/2019    Hypertension This is a chronic problem. The problem is controlled. Associated symptoms include palpitations. Pertinent negatives include no chest pain, headaches or shortness of breath. Past treatments include diuretics and calcium channel blockers. The current treatment provides significant improvement. hx of tachycardia followed by cardiology. Identifiable causes of hypertension include a thyroid problem.  Thyroid Problem Presents for follow-up (followed by Dr. Kathyrn Sheriff) visit. Symptoms include palpitations. Patient reports no anxiety, constipation, diarrhea or fatigue. The symptoms have been stable.  Hx Breast cancer - last oncology note summary:  04/2019 Clinically doing well and no evidence of recurrent disease today             Screening mammogram in 2020 showed no radiographic evidence of recurrent or new disease             CA 27.29 normal and stable today.              Unable to tolerate extended adjuvant hormonal therapies (risk of recurrence > 12% as above) due to side effects and elected to discontinue in 2017.              Discussed NCCN surveillance guidelines including annual mammograms and clinical breast exams             Plan to repeat mammogram in June 2021.  Ankle/foot edema - feels like her soles are swollen frequently.  Her HCTZ and KCL was cut in half by cardiology due to low electrolytes.  She tries to limit sodium intake, drinks water and elevates  as needed.  Atrial fibrillation - diagnosed by Zio monitor and EKG by cardiology.  Now on metoprolol 75 mg per day.  Still has brief episodes of fibrillation every few days.  These are not associated with weakness, chest pain or SOB.  It was suggested that sleep apnea may be playing a role in recurrence.  Sleep issues - she snores and her husband has witnessed apnea and gasping.  In 2018 she had a sleep study with a high RDI - cpap at 11 was ordered.  Shortly after she had her tonsils removed and a second sleep study that showed she was just borderline abnormal.  I do not have that study.  Epworth score is 6 today.  Lab Results  Component Value Date   CREATININE 0.99 05/01/2019   BUN 11 05/01/2019   NA 133 (L) 05/01/2019   K 3.6 05/01/2019   CL 96 (L) 05/01/2019   CO2 27 05/01/2019   Lab Results  Component Value Date   CHOL 290 (H) 07/05/2018   HDL 46 07/05/2018   LDLCALC 216 (H) 07/05/2018   TRIG 142 07/05/2018   CHOLHDL 6.3 (H) 07/05/2018   Lab Results  Component Value Date   TSH 2.060 05/23/2019   No results found for: HGBA1C Lab Results  Component Value Date   WBC 9.8 05/01/2019   HGB 12.9 05/01/2019   HCT 40.1 05/01/2019   MCV 87.9 05/01/2019   PLT  310 05/01/2019   Lab Results  Component Value Date   ALT 13 05/01/2019   AST 18 05/01/2019   ALKPHOS 68 05/01/2019   BILITOT 0.5 05/01/2019     Review of Systems  Constitutional: Negative for chills, fatigue, fever and unexpected weight change.  HENT: Negative for hearing loss and trouble swallowing.   Eyes: Negative for visual disturbance.  Respiratory: Negative for cough, chest tightness, shortness of breath and wheezing.   Cardiovascular: Positive for palpitations and leg swelling. Negative for chest pain.  Gastrointestinal: Negative for abdominal pain, constipation and diarrhea.  Genitourinary: Negative for dysuria.  Musculoskeletal: Negative for arthralgias, back pain and gait problem.  Skin: Negative for  rash.  Allergic/Immunologic: Negative for environmental allergies.  Neurological: Negative for dizziness, weakness, light-headedness and headaches.  Psychiatric/Behavioral: Positive for sleep disturbance. Negative for dysphoric mood. The patient is not nervous/anxious.     Patient Active Problem List   Diagnosis Date Noted  . Paroxysmal atrial fibrillation ([place]) 07/11/2019  . Multiple thyroid nodules 08/23/2018  . Mixed hyperlipidemia 07/06/2018  . Microscopic hematuria 05/04/2018  . Liver hemangioma 03/26/2017  . Anxiety attack 06/15/2016  . Macroglossia 02/05/2016  . Environmental and seasonal allergies 01/02/2016  . Hot flashes related to aromatase inhibitor therapy 09/08/2015  . Vitamin D deficiency 04/19/2015  . Cervical radiculopathy 04/17/2015  . Essential hypertension 04/17/2015  . History of partial thyroidectomy 04/17/2015  . Obesity, Class II, BMI 35-39.9 04/17/2015  . Lipoma of axilla 10/24/2014  . History of left breast cancer 09/02/2010    Allergies  Allergen Reactions  . Ace Inhibitors Swelling  . Sernivo [Betamethasone Dipropionate] Itching  . Penicillins Rash    Has patient had a PCN reaction causing immediate rash, facial/tongue/throat swelling, SOB or lightheadedness with hypotension: Yes Has patient had a PCN reaction causing severe rash involving mucus membranes or skin necrosis: No Has patient had a PCN reaction that required hospitalization: No Has patient had a PCN reaction occurring within the last 10 years: No If all of the above answers are "NO", then may proceed with Cephalosporin use.    Past Surgical History:  Procedure Laterality Date  . ABDOMINAL HYSTERECTOMY  2011  . birth mark removed    . BREAST CYST ASPIRATION Right    negative 01/2010  . BREAST EXCISIONAL BIOPSY  August 05, 2010   left breast positive 07/2010  . BREAST LUMPECTOMY  2012   left breast  . COLONOSCOPY  July 29, 2012   normal study.  . robotic tonsillectomy Lingual tonsils  Bilateral 06/08/2016  . THYROID SURGERY     partially removed  . TUBAL LIGATION      Social History   Tobacco Use  . Smoking status: Never Smoker  . Smokeless tobacco: Never Used  Substance Use Topics  . Alcohol use: No    Alcohol/week: 0.0 standard drinks  . Drug use: No     Medication list has been reviewed and updated.  Current Meds  Medication Sig  . amLODipine (NORVASC) 5 MG tablet TAKE 1 TABLET BY MOUTH EVERY DAY  . B Complex-Biotin-FA (B-COMPLEX PO) Take 1 tablet by mouth daily.  . CHOLECALCIFEROL PO Take 4,000 Units by mouth daily.  . hydrochlorothiazide (MICROZIDE) 12.5 MG capsule Take 1 capsule (12.5 mg total) by mouth daily.  [name] levothyroxine (SYNTHROID) 25 MCG tablet Take 25 mcg by mouth daily before breakfast.  . LORazepam (ATIVAN) 0.5 MG tablet Take 1 tablet (0.5 mg total) by mouth every 8 (eight) hours as needed for anxiety.  [name]  metoprolol succinate (TOPROL-XL) 50 MG 24 hr tablet Take 1.5 tablets (75 mg total) by mouth daily. Take with or immediately following a meal.  . Multiple Vitamin (MULTIVITAMIN) tablet Take 1 tablet by mouth daily.  . potassium chloride SA (KLOR-CON) 20 MEQ tablet Take 1 tablet (20 mEq total) by mouth daily.  . rivaroxaban (XARELTO) 20 MG TABS tablet Take 1 tablet (20 mg total) by mouth daily with supper.    PHQ 2/9 Scores 07/11/2019 05/19/2019 08/10/2018 07/05/2018  PHQ - 2 Score 1 0 2 4  PHQ- 9 Score 1 0 7 6   GAD 7 : Generalized Anxiety Score 07/11/2019 03/26/2017  Nervous, Anxious, on Edge 0 3  Control/stop worrying 0 2  Worry too much - different things 0 2  Trouble relaxing 0 2  Restless 0 2  Easily annoyed or irritable 0 3  Afraid - awful might happen 0 2  Total GAD 7 Score 0 16  Anxiety Difficulty - Very difficult    BP Readings from Last 3 Encounters:  07/11/19 126/78  06/20/19 120/80  05/23/19 130/80    Physical Exam Vitals and nursing note reviewed.  Constitutional:      General: She is not in acute distress.     Appearance: She is well-developed. She is obese.  HENT:     Head: Normocephalic and atraumatic.     Right Ear: Tympanic membrane and ear canal normal.     Left Ear: Tympanic membrane and ear canal normal.     Nose:     Right Sinus: No maxillary sinus tenderness.     Left Sinus: No maxillary sinus tenderness.     Mouth/Throat:     Mouth: Mucous membranes are moist.  Eyes:     General: No scleral icterus.       Right eye: No discharge.        Left eye: No discharge.     Conjunctiva/sclera: Conjunctivae normal.  Neck:     Thyroid: No thyromegaly.     Vascular: No carotid bruit.  Cardiovascular:     Rate and Rhythm: Normal rate and regular rhythm.     Pulses: Normal pulses.     Heart sounds: Normal heart sounds.  Pulmonary:     Effort: Pulmonary effort is normal. No respiratory distress.     Breath sounds: No wheezing.  Abdominal:     General: Bowel sounds are normal.     Palpations: Abdomen is soft.     Tenderness: There is no abdominal tenderness.  Musculoskeletal:        General: Swelling (mild non pitting swelling of dorsum of each foot) present. Normal range of motion.     Cervical back: Normal range of motion. No erythema.     Right lower leg: No edema.     Left lower leg: No edema.  Lymphadenopathy:     Cervical: No cervical adenopathy.  Skin:    General: Skin is warm and dry.     Capillary Refill: Capillary refill takes less than 2 seconds.     Findings: No rash.  Neurological:     General: No focal deficit present.     Mental Status: She is alert and oriented to person, place, and time.     Cranial Nerves: No cranial nerve deficit.     Sensory: No sensory deficit.     Deep Tendon Reflexes: Reflexes are normal and symmetric.  Psychiatric:        Mood and Affect: Mood normal.  Speech: Speech normal.        Behavior: Behavior normal.     Wt Readings from Last 3 Encounters:  07/11/19 243 lb (110.2 kg)  06/20/19 240 lb (108.9 kg)  05/23/19 245 lb 4 oz  (111.2 kg)    BP 126/78   Pulse 76   Ht 5\' 7"  (1.702 m)   Wt 243 lb (110.2 kg)   LMP 04/01/2008 Comment: Hysterectomy was due to endometriosis. - still has ovaries.   SpO2 99%   BMI 38.06 kg/m   Assessment and Plan: 1. Annual physical exam Normal exam except for weight Work on diet and more exercise Will request colonoscopy report to determine follow up interval - Hemoglobin A1c  2. Essential hypertension Clinically stable exam with well controlled BP on current medications. Tolerating medications without side effects at this time. Pt to continue current regimen and low sodium diet; benefits of regular exercise as able discussed. - amLODipine (NORVASC) 5 MG tablet; Take 1 tablet (5 mg total) by mouth daily.  Dispense: 90 tablet; Refill: 1 - CBC with Differential/Platelet - Comprehensive metabolic panel - POCT urinalysis dipstick  3. Paroxysmal atrial fibrillation ([facility]) On beta blocker but no anticoagulation Recurrent brief episodes noted Follow up with cardiology  4. Multiple thyroid nodules Followed by ENT; labs done recently  5. Mixed hyperlipidemia Check labs and advise - Lipid panel  6. History of left breast cancer Followed by Oncology Mammogram and DEXA ordered next month  7. Sleep disorder breathing Refer for repeat study - Ambulatory referral to Sleep Studies   Partially dictated using Dragon software. Any errors are unintentional.  Halina Maidens, MD [facility]  07/11/2019

## 2019-07-12 LAB — CBC WITH DIFFERENTIAL/PLATELET
Basophils Absolute: 0.1 10*3/uL (ref 0.0–0.2)
Basos: 1 %
EOS (ABSOLUTE): 0.3 10*3/uL (ref 0.0–0.4)
Eos: 3 %
Hematocrit: 39.5 % (ref 34.0–46.6)
Hemoglobin: 12.9 g/dL (ref 11.1–15.9)
Immature Grans (Abs): 0 10*3/uL (ref 0.0–0.1)
Immature Granulocytes: 0 %
Lymphocytes Absolute: 3.3 10*3/uL — ABNORMAL HIGH (ref 0.7–3.1)
Lymphs: 38 %
MCH: 28.9 pg (ref 26.6–33.0)
MCHC: 32.7 g/dL (ref 31.5–35.7)
MCV: 88 fL (ref 79–97)
Monocytes Absolute: 0.4 10*3/uL (ref 0.1–0.9)
Monocytes: 5 %
Neutrophils Absolute: 4.6 10*3/uL (ref 1.4–7.0)
Neutrophils: 53 %
Platelets: 317 10*3/uL (ref 150–450)
RBC: 4.47 x10E6/uL (ref 3.77–5.28)
RDW: 13.4 % (ref 11.7–15.4)
WBC: 8.7 10*3/uL (ref 3.4–10.8)

## 2019-07-12 LAB — COMPREHENSIVE METABOLIC PANEL
ALT: 11 IU/L (ref 0–32)
AST: 15 IU/L (ref 0–40)
Albumin/Globulin Ratio: 1.7 (ref 1.2–2.2)
Albumin: 4.5 g/dL (ref 3.8–4.9)
Alkaline Phosphatase: 85 IU/L (ref 48–121)
BUN/Creatinine Ratio: 11 (ref 9–23)
BUN: 12 mg/dL (ref 6–24)
Bilirubin Total: 0.4 mg/dL (ref 0.0–1.2)
CO2: 23 mmol/L (ref 20–29)
Calcium: 9.3 mg/dL (ref 8.7–10.2)
Chloride: 103 mmol/L (ref 96–106)
Creatinine, Ser: 1.05 mg/dL — ABNORMAL HIGH (ref 0.57–1.00)
GFR calc Af Amer: 68 mL/min/{1.73_m2} (ref >59)
GFR calc non Af Amer: 59 mL/min/{1.73_m2} — ABNORMAL LOW (ref >59)
Globulin, Total: 2.7 g/dL (ref 1.5–4.5)
Glucose: 92 mg/dL (ref 65–99)
Potassium: 3.9 mmol/L (ref 3.5–5.2)
Sodium: 142 mmol/L (ref 134–144)
Total Protein: 7.2 g/dL (ref 6.0–8.5)

## 2019-07-12 LAB — LIPID PANEL
Chol/HDL Ratio: 5.5 ratio — ABNORMAL HIGH (ref 0.0–4.4)
Cholesterol, Total: 282 mg/dL — ABNORMAL HIGH (ref 100–199)
HDL: 51 mg/dL (ref >39)
LDL Chol Calc (NIH): 211 mg/dL — ABNORMAL HIGH (ref 0–99)
Triglycerides: 111 mg/dL (ref 0–149)
VLDL Cholesterol Cal: 20 mg/dL (ref 5–40)

## 2019-07-12 LAB — HEMOGLOBIN A1C
Est. average glucose Bld gHb Est-mCnc: 120 mg/dL
Hgb A1c MFr Bld: 5.8 % — ABNORMAL HIGH (ref 4.8–5.6)

## 2019-07-13 ENCOUNTER — Encounter: Payer: Self-pay | Admitting: Internal Medicine

## 2019-07-13 DIAGNOSIS — Z1211 Encounter for screening for malignant neoplasm of colon: Secondary | ICD-10-CM | POA: Insufficient documentation

## 2019-07-13 DIAGNOSIS — Z1331 Encounter for screening for depression: Secondary | ICD-10-CM | POA: Insufficient documentation

## 2019-07-27 ENCOUNTER — Ambulatory Visit
Admission: RE | Admit: 2019-07-27 | Discharge: 2019-07-27 | Disposition: A | Discharge status: Home / Self Care | Payer: BC Managed Care – PPO | Source: Ambulatory Visit | Attending: Hematology and Oncology | Admitting: Hematology and Oncology

## 2019-07-27 ENCOUNTER — Other Ambulatory Visit: Payer: Self-pay | Admitting: Hematology and Oncology

## 2019-07-27 DIAGNOSIS — Z853 Personal history of malignant neoplasm of breast: Secondary | ICD-10-CM | POA: Insufficient documentation

## 2019-07-27 DIAGNOSIS — N63 Unspecified lump in unspecified breast: Secondary | ICD-10-CM

## 2019-08-15 ENCOUNTER — Other Ambulatory Visit: Payer: Self-pay | Admitting: Family

## 2019-11-21 ENCOUNTER — Other Ambulatory Visit: Payer: Self-pay | Admitting: Hematology and Oncology

## 2019-11-21 DIAGNOSIS — R2231 Localized swelling, mass and lump, right upper limb: Secondary | ICD-10-CM

## 2019-11-22 ENCOUNTER — Other Ambulatory Visit: Payer: Self-pay | Admitting: Hematology and Oncology

## 2019-11-22 DIAGNOSIS — R2231 Localized swelling, mass and lump, right upper limb: Secondary | ICD-10-CM

## 2019-11-27 ENCOUNTER — Ambulatory Visit
Admission: RE | Admit: 2019-11-27 | Discharge: 2019-11-27 | Disposition: A | Discharge status: Home / Self Care | Payer: BC Managed Care – PPO | Source: Ambulatory Visit | Attending: Hematology and Oncology | Admitting: Hematology and Oncology

## 2019-11-27 ENCOUNTER — Other Ambulatory Visit: Payer: Self-pay

## 2019-11-27 ENCOUNTER — Other Ambulatory Visit: Payer: Self-pay | Admitting: Hematology and Oncology

## 2019-11-27 DIAGNOSIS — R2231 Localized swelling, mass and lump, right upper limb: Secondary | ICD-10-CM | POA: Diagnosis present

## 2019-11-27 DIAGNOSIS — R928 Other abnormal and inconclusive findings on diagnostic imaging of breast: Secondary | ICD-10-CM

## 2019-12-04 ENCOUNTER — Ambulatory Visit
Admission: RE | Admit: 2019-12-04 | Discharge: 2019-12-04 | Disposition: A | Discharge status: Home / Self Care | Payer: BC Managed Care – PPO | Source: Ambulatory Visit | Attending: Hematology and Oncology | Admitting: Hematology and Oncology

## 2019-12-04 ENCOUNTER — Other Ambulatory Visit: Payer: Self-pay

## 2019-12-04 DIAGNOSIS — R928 Other abnormal and inconclusive findings on diagnostic imaging of breast: Secondary | ICD-10-CM

## 2019-12-06 LAB — SURGICAL PATHOLOGY

## 2019-12-31 NOTE — Progress Notes (Signed)
Cardiology Office Note  Date:  01/01/2020   ID:  Heather Dudley, DOB [birth year], MRN [id number]  PCP:  Glean Hess, MD   Chief Complaint  Patient presents with  . Follow-up    6 months  Pt states she is still having palpitations--not everyday, unsure what triggers it. States she had an episode yesterday right after taking her potassium pill. Episiodes happen randomly. Has been taking propranolol when the episodes happen---also has been taking metoprolol which has made the episodes less frequent.    HPI:  Heather Dudley is a 59 y.o. female  HTN Nonsmoker No diabetes History of breast cancer on the left, lumpectomy and lymph node dissection.  Completed chemotherapy remote chest pain Dx with CPAP, not on CPAP, 1-2 years ago Presents for f/u of her chest pain, tachycardia, paroxysmal atrial fibrillation  Prior event monitor had paroxysmal atrial fibrillation Continues on metoprolol, Xarelto  He continues to have paroxysmal atrial fibrillation Sometimes episodes lasting up to 1 hour at a time Takes extra propranolol 20, but seems to take a long time for this to kick in Having 2-3 episodes a week  Continues to take Xarelto and metoprolol 75 mg daily  Long discussion concerning her sleep apnea Previous diagnosis, was told it was not serious and did not choose to be on CPAP at that time  Thinking about retiring, husband is younger  EKG personally reviewed by myself on todays visit Shows normal sinus rhythm rate 74 bpm no significant ST or T wave changes  Echo reviewed Left ventricular ejection fraction, by estimation, is 55 to 60%. The  left ventricle has normal function Left ventricular diastolic parameters  are consistent with Grade II diastolic dysfunction (pseudonormalization).  moderately elevated pulmonary artery systolic pressure.   Monitor reviewed avg HR of 79 bpm. Predominant underlying rhythm was Sinus Rhythm. Atrial Fibrillation occurred (2%  burden), ranging from 73-175 bpm (avg of 115 bpm), the longest lasting 52 mins 28 secs with an avg rate of 118 bpm. Atrial Fibrillation was detected within +/- 45 seconds of symptomatic patient event(s). Isolated SVEs were rare (<1.0%), SVE Couplets were rare (<1.0%), and SVE Triplets were rare (<1.0%). Isolated VEs were rare (<1.0%), and no VE Couplets or VE Triplets were present.  CT chest: Images reviewed No significant coronary calcified atherosclerosis or aortic plaque   March 21, 2017 she awoke from sleep with tachycardia  tightness in her chest Went to the emergency room, work-up negative  History of lumpectomy and lymph node dissection.  Sometimes this causes discomfort in her left arm  PMH:   has a past medical history of Basal cell carcinoma of forehead, Breast cancer ([place]), Breast screening, unspecified, Cellulitis and abscess of trunk, Hernia, History of chemotherapy (2012), Hypertension, Lump or mass in breast, Malignant neoplasm of breast (female), unspecified site, Malignant neoplasm of upper-outer quadrant of female breast ([place]), Neoplasm of uncertain behavior of connective and other soft tissue, Obesity, unspecified, Paroxysmal tachycardia ([place]) (05/04/2017), Personal history of chemotherapy, Personal history of malignant neoplasm of breast, Personal history of radiation therapy (2013), Screening for obesity, and Thyroid nodule.  PSH:    Past Surgical History:  Procedure Laterality Date  . ABDOMINAL HYSTERECTOMY  2011  . birth mark removed    . BREAST CYST ASPIRATION Right    negative 01/2010  . BREAST EXCISIONAL BIOPSY  August 05, 2010   left breast positive 07/2010  . BREAST LUMPECTOMY  2012   left breast  . COLONOSCOPY  July 29, 2012  normal study.  . robotic tonsillectomy Lingual tonsils Bilateral 06/08/2016  . THYROID SURGERY     partially removed  . TUBAL LIGATION      Current Outpatient Medications  Medication Sig Dispense Refill  . amLODipine (NORVASC) 5  MG tablet Take 1 tablet (5 mg total) by mouth daily. 90 tablet 1  . B Complex-Biotin-FA (B-COMPLEX PO) Take 1 tablet by mouth daily.    . CHOLECALCIFEROL PO Take 4,000 Units by mouth daily.    [name] levothyroxine (SYNTHROID) 25 MCG tablet Take 25 mcg by mouth daily before breakfast.    . LORazepam (ATIVAN) 0.5 MG tablet Take 1 tablet (0.5 mg total) by mouth every 8 (eight) hours as needed for anxiety. 20 tablet 0  . Multiple Vitamin (MULTIVITAMIN) tablet Take 1 tablet by mouth daily.    . potassium chloride SA (KLOR-CON) 20 MEQ tablet Take 1 tablet (20 mEq total) by mouth daily. 90 tablet 3  . propranolol (INDERAL) 20 MG tablet Take 1 tablet (20 mg total) by mouth 3 (three) times daily as needed. 90 tablet 0  . rivaroxaban (XARELTO) 20 MG TABS tablet Take 1 tablet (20 mg total) by mouth daily with supper. 30 tablet 4  . hydrochlorothiazide (MICROZIDE) 12.5 MG capsule Take 1 capsule (12.5 mg total) by mouth daily. 90 capsule 3  . metoprolol succinate (TOPROL-XL) 50 MG 24 hr tablet Take 1.5 tablets (75 mg total) by mouth daily. Take with or immediately following a meal. 120 tablet 3   No current facility-administered medications for this visit.     Allergies:   Ace inhibitors, Sernivo [betamethasone dipropionate], and Penicillins   Social History:  The patient  reports that she has never smoked. She has never used smokeless tobacco. She reports that she does not drink alcohol and does not use drugs.   Family History:   family history includes Cancer in her cousin, maternal uncle, mother, and paternal aunt; Colon cancer in an other family member; Heart failure in her father; Ovarian cancer in an other family member.    Review of Systems: Review of Systems  Constitutional: Negative.   HENT: Negative.   Respiratory: Negative.   Cardiovascular: Negative.   Gastrointestinal: Negative.   Musculoskeletal: Negative.   Neurological: Negative.   Psychiatric/Behavioral: Negative.   All other systems  reviewed and are negative.   PHYSICAL EXAM: VS:  BP 140/82   Pulse 74   Ht 5\' 7"  (1.702 m)   Wt 241 lb (109.3 kg)   LMP 04/01/2008 Comment: Hysterectomy was due to endometriosis. - still has ovaries.   BMI 37.75 kg/m  , BMI Body mass index is 37.75 kg/m. GEN: Well nourished, well developed, in no acute distress  HEENT: normal  Neck: no JVD, carotid bruits, or masses Cardiac: RRR; no murmurs, rubs, or gallops,no edema  Respiratory:  clear to auscultation bilaterally, normal work of breathing GI: soft, nontender, nondistended, + BS MS: no deformity or atrophy  Skin: warm and dry, no rash Neuro:  Strength and sensation are intact Psych: euthymic mood, full affect   Recent Labs: 05/23/2019: TSH 2.060 07/11/2019: ALT 11; BUN 12; Creatinine, Ser 1.05; Hemoglobin 12.9; Platelets 317; Potassium 3.9; Sodium 142    Lipid Panel Lab Results  Component Value Date   CHOL 282 (H) 07/11/2019   HDL 51 07/11/2019   LDLCALC 211 (H) 07/11/2019   TRIG 111 07/11/2019     Wt Readings from Last 3 Encounters:  01/01/20 241 lb (109.3 kg)  07/11/19 243 lb (110.2 kg)  06/20/19 240 lb (108.9 kg)     ASSESSMENT AND PLAN:  Paroxysmal atrial fibrillation Continue metoprolol with Xarelto Discussed adding antiarrhythmic She prefers to take antiarrhythmic as needed, scription for flecainide 100 twice daily as needed s If she starts taking this on a regular basis recommend she call our office, will need routine treadmill study  Chest pain, unspecified type - Plan: EKG 12-Lead CT scan chest reviewed no coronary calcification, no aortic atherosclerosis No further work-up No recent symptoms  Hyperlipidemia, mixed She prefers no medication at this time Lifestyle modification recommended  Essential hypertension - Plan: EKG 12-Lead Blood pressure stable, continue metoprolol with low-dose HCTZ  Malignant neoplasm of left breast in female, estrogen receptor positive, unspecified site of breast  ([place]) Previous chemotherapy and radiation on the left Lymphedema left arm Stable  Obesity Lifestyle modification recommended   Total encounter time more than 25 minutes  Greater than 50% was spent in counseling and coordination of care with the patient   No orders of the defined types were placed in this encounter.    Signed, Esmond Plants, M.D., Ph.D. 01/01/2020  [place], [place]

## 2020-01-01 ENCOUNTER — Other Ambulatory Visit: Payer: Self-pay

## 2020-01-01 ENCOUNTER — Encounter: Payer: Self-pay | Admitting: Cardiovascular Disease

## 2020-01-01 ENCOUNTER — Ambulatory Visit (INDEPENDENT_AMBULATORY_CARE_PROVIDER_SITE_OTHER): Payer: BC Managed Care – PPO | Admitting: Cardiovascular Disease

## 2020-01-01 VITALS — BP 140/82 | HR 74 | Ht 67.0 in | Wt 241.0 lb

## 2020-01-01 DIAGNOSIS — I479 Paroxysmal tachycardia, unspecified: Secondary | ICD-10-CM | POA: Diagnosis not present

## 2020-01-01 DIAGNOSIS — R079 Chest pain, unspecified: Secondary | ICD-10-CM | POA: Diagnosis not present

## 2020-01-01 DIAGNOSIS — I4891 Unspecified atrial fibrillation: Secondary | ICD-10-CM

## 2020-01-01 DIAGNOSIS — E782 Mixed hyperlipidemia: Secondary | ICD-10-CM

## 2020-01-01 DIAGNOSIS — Z170 Estrogen receptor positive status [ER+]: Secondary | ICD-10-CM

## 2020-01-01 DIAGNOSIS — C50912 Malignant neoplasm of unspecified site of left female breast: Secondary | ICD-10-CM

## 2020-01-01 DIAGNOSIS — I10 Essential (primary) hypertension: Secondary | ICD-10-CM

## 2020-01-01 MED ORDER — FLECAINIDE ACETATE 100 MG PO TABS
100.0000 mg | ORAL_TABLET | Freq: Two times a day (BID) | ORAL | 3 refills | Status: DC | PRN
Start: 2020-01-01 — End: 2020-01-26

## 2020-01-01 NOTE — Patient Instructions (Signed)
Medication Instructions:  Please take flecainide 100 mg twice a day as needed for breakthrough atrial fibrillation  If you need a refill on your cardiac medications before your next appointment, please call your pharmacy.    Lab work: No new labs needed   If you have labs (blood work) drawn today and your tests are completely normal, you will receive your results only by: [name] MyChart Message (if you have MyChart) OR . A paper copy in the mail If you have any lab test that is abnormal or we need to change your treatment, we will call you to review the results.   Testing/Procedures: No new testing needed   Follow-Up: At [facility], you and your health needs are our priority.  As part of our continuing mission to provide you with exceptional heart care, we have created designated Provider Care Teams.  These Care Teams include your primary Cardiologist (physician) and Advanced Practice Providers (APPs -  Physician Assistants and Nurse Practitioners) who all work together to provide you with the care you need, when you need it.  . You will need a follow up appointment in 12 months  . Providers on your designated Care Team:   . Murray Hodgkins, NP . Christell Faith, PA-C . Marrianne Mood, PA-C  Any Other Special Instructions Will Be Listed Below (If Applicable).  COVID-19 Vaccine Information can be found at: [email or web address].uk For questions related to vaccine distribution or appointments, please email [email or web address] or call [phone].

## 2020-01-07 ENCOUNTER — Other Ambulatory Visit: Payer: Self-pay | Admitting: Family

## 2020-01-24 ENCOUNTER — Emergency Department: Payer: BC Managed Care – PPO

## 2020-01-24 ENCOUNTER — Telehealth: Payer: Self-pay

## 2020-01-24 ENCOUNTER — Encounter: Payer: Self-pay | Admitting: Emergency Medicine

## 2020-01-24 ENCOUNTER — Ambulatory Visit (INDEPENDENT_AMBULATORY_CARE_PROVIDER_SITE_OTHER)
Admission: EM | Admit: 2020-01-24 | Discharge: 2020-01-24 | Disposition: A | Discharge status: Home / Self Care | Payer: BC Managed Care – PPO | Source: Home / Self Care | Attending: Emergency Medicine | Admitting: Emergency Medicine

## 2020-01-24 ENCOUNTER — Other Ambulatory Visit: Payer: Self-pay

## 2020-01-24 DIAGNOSIS — R279 Unspecified lack of coordination: Secondary | ICD-10-CM | POA: Diagnosis not present

## 2020-01-24 DIAGNOSIS — R079 Chest pain, unspecified: Secondary | ICD-10-CM | POA: Insufficient documentation

## 2020-01-24 DIAGNOSIS — Z5321 Procedure and treatment not carried out due to patient leaving prior to being seen by health care provider: Secondary | ICD-10-CM | POA: Insufficient documentation

## 2020-01-24 LAB — CBC
HCT: 39.3 % (ref 36.0–46.0)
Hemoglobin: 12.7 g/dL (ref 12.0–15.0)
MCH: 28.4 pg (ref 26.0–34.0)
MCHC: 32.3 g/dL (ref 30.0–36.0)
MCV: 87.9 fL (ref 80.0–100.0)
Platelets: 293 10*3/uL (ref 150–400)
RBC: 4.47 MIL/uL (ref 3.87–5.11)
RDW: 13.5 % (ref 11.5–15.5)
WBC: 9.6 10*3/uL (ref 4.0–10.5)
nRBC: 0 % (ref 0.0–0.2)

## 2020-01-24 LAB — BASIC METABOLIC PANEL
Anion gap: 9 (ref 5–15)
BUN: 18 mg/dL (ref 6–20)
CO2: 29 mmol/L (ref 22–32)
Calcium: 9.1 mg/dL (ref 8.9–10.3)
Chloride: 99 mmol/L (ref 98–111)
Creatinine, Ser: 1.12 mg/dL — ABNORMAL HIGH (ref 0.44–1.00)
GFR, Estimated: 57 mL/min — ABNORMAL LOW (ref >60)
Glucose, Bld: 96 mg/dL (ref 70–99)
Potassium: 4 mmol/L (ref 3.5–5.1)
Sodium: 137 mmol/L (ref 135–145)

## 2020-01-24 LAB — TROPONIN I (HIGH SENSITIVITY): Troponin I (High Sensitivity): 4 ng/L (ref <18)

## 2020-01-24 LAB — GLUCOSE, CAPILLARY: Glucose-Capillary: 93 mg/dL (ref 70–99)

## 2020-01-24 NOTE — Telephone Encounter (Signed)
Pt came in stated she was not feeling well, felt a bit unsteady and was having some sob. She also mentioned she has afib and was having a spell of it yesterday.Checked with CMA and advised pt to go to [facility] or ER.

## 2020-01-24 NOTE — ED Triage Notes (Signed)
Pt here with CP that started a few days ago. Pt has a fib but states that this pain was different. Pt states pain is on the left side and radiates to her left arm. Pt is also on chemo. Pt NAD in triage.

## 2020-01-24 NOTE — ED Provider Notes (Signed)
HPI  SUBJECTIVE:  Heather Dudley is a 59 y.o. female who presents with the acute onset of sudden discoordination described as " I cannot walk in a straight line" starting 0830 this morning.  She states that she feels as if she is going to fall to the right.  She states that she got better, and then it returned.  She still having the symptoms, but they are not as severe.  She denies arm or leg weakness, facial droop, slurred speech, headache, blurry vision.  No fall.  Denies vertigo, ear pain.  She reports fatigue today.  She tried drinking extra fluids without improvement in her symptoms.  No aggravating factors.  She has never had symptoms like this before.  She states that she had a very long episode of atrial fibrillation last night for which she took flecainide, but the A. fib has since resolved.    She also reports substernal left-sided chest pain described as heaviness and as a "weight on my chest" that is different than her usual chest pain.  She states it started last night during the episode of atrial fibrillation and has been constant since.  It is worse with lying down.  There is no exertional component.  She states that she feels short of breath as an "I have to make an extra effort to breathe".  It does not radiate up her neck, down her arm, or through to her back.  She denies nausea, diaphoresis, abdominal pain, syncope.  Denies GERD symptoms.  States that her GERD is usually more of a "burning chest pain".  This is different than the chest pain that she has had before.  Past medical history of left-sided breast cancer status post lumpectomy, lymph node dissection, chemotherapy, radiation.  She has a history of hypertension, takes her medications at night.  She has a history of hypercholesterolemia.  No history of DVT, PE, diabetes, chronic kidney disease, stroke, sickle cell disease.  Family history is negative for coronary artery disease, MI father in 83s, stroke him both grandmothers  at age 45 and age 57.  [id number], Jesse Sans, MD    Past Medical History:  Diagnosis Date  . Basal cell carcinoma of forehead    birthmark of forehead  . Breast cancer ([place])   . Breast screening, unspecified   . Cellulitis and abscess of trunk   . Hernia   . History of chemotherapy 2012  . Hypertension   . Lump or mass in breast   . Malignant neoplasm of breast (female), unspecified site    She underwent wide excision, mastoplasty and axillary dissection for her left breast malignancy on September 03, 2010. The primary tumor was 1 cm in diameter, and a single positive axillary node 1.3 cm in diameter. This was an ER-positive, PR slightly positive, HER-2/neu not overexpressing tumor. She tolerated her whole breast radiation without difficulty ending in late February 2013.  . Malignant neoplasm of upper-outer quadrant of female breast ([place])   . 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 Obesity, unspecified   . Paroxysmal tachycardia ([place]) 05/04/2017  . Personal history of chemotherapy   . Personal history of malignant neoplasm of breast    The patient underwent wide excision, mastoplasty. Dissection for a T1b, N1, M0 carcinoma left breast on September 03, 2010.The primary tumor was 1 cm in diameter, and a single positive axillary node 1.3 cm in diameter. This was an ER-positive, PR slightly positive, HER-2/neu not overexpressing tumor.  The patient completed whole breast radiation in February 2013.  [name] Personal history of radiation therapy 2013   LEFT lumpectomy  . Screening for obesity   . Thyroid nodule     Past Surgical History:  Procedure Laterality Date  . ABDOMINAL HYSTERECTOMY  2011  . birth mark removed    . BREAST CYST ASPIRATION Right    negative 01/2010  . BREAST EXCISIONAL BIOPSY  August 05, 2010   left breast positive 07/2010  . BREAST LUMPECTOMY  2012   left breast  . COLONOSCOPY  July 29, 2012   normal study.  . robotic tonsillectomy Lingual tonsils  Bilateral 06/08/2016  . THYROID SURGERY     partially removed  . TUBAL LIGATION      Family History  Problem Relation Age of Onset  . Colon cancer Other   . Ovarian cancer Other   . Cancer Mother        lung ca  . Cancer Maternal Uncle        lung ca  . Cancer Paternal Aunt        ovarian ca  . Cancer Cousin        female ca  . Heart failure Father   . Breast cancer Neg Hx     Social History   Tobacco Use  . Smoking status: Never Smoker  . Smokeless tobacco: Never Used  Vaping Use  . Vaping Use: Never used  Substance Use Topics  . Alcohol use: No    Alcohol/week: 0.0 standard drinks  . Drug use: No    No current facility-administered medications for this encounter.  Current Outpatient Medications:  .  amLODipine (NORVASC) 5 MG tablet, Take 1 tablet (5 mg total) by mouth daily., Disp: 90 tablet, Rfl: 1 .  B Complex-Biotin-FA (B-COMPLEX PO), Take 1 tablet by mouth daily., Disp: , Rfl:  .  CHOLECALCIFEROL PO, Take 4,000 Units by mouth daily., Disp: , Rfl:  .  flecainide (TAMBOCOR) 100 MG tablet, Take 1 tablet (100 mg total) by mouth 2 (two) times daily as needed., Disp: 60 tablet, Rfl: 3 .  hydrochlorothiazide (MICROZIDE) 12.5 MG capsule, Take 1 capsule (12.5 mg total) by mouth daily., Disp: 90 capsule, Rfl: 3 .  levothyroxine (SYNTHROID) 25 MCG tablet, Take 25 mcg by mouth daily before breakfast., Disp: , Rfl:  .  LORazepam (ATIVAN) 0.5 MG tablet, Take 1 tablet (0.5 mg total) by mouth every 8 (eight) hours as needed for anxiety., Disp: 20 tablet, Rfl: 0 .  metoprolol succinate (TOPROL-XL) 50 MG 24 hr tablet, Take 1.5 tablets (75 mg total) by mouth daily. Take with or immediately following a meal., Disp: 120 tablet, Rfl: 3 .  Multiple Vitamin (MULTIVITAMIN) tablet, Take 1 tablet by mouth daily., Disp: , Rfl:  .  potassium chloride SA (KLOR-CON) 20 MEQ tablet, Take 1 tablet (20 mEq total) by mouth daily., Disp: 90 tablet, Rfl: 3 .  propranolol (INDERAL) 20 MG tablet, Take  1 tablet (20 mg total) by mouth 3 (three) times daily as needed., Disp: 90 tablet, Rfl: 0 .  XARELTO 20 MG TABS tablet, TAKE 1 TABLET (20 MG TOTAL) BY MOUTH DAILY WITH SUPPER., Disp: 90 tablet, Rfl: 1  Allergies  Allergen Reactions  . Ace Inhibitors Swelling  . Sernivo [Betamethasone Dipropionate] Itching  . Penicillins Rash    Has patient had a PCN reaction causing immediate rash, facial/tongue/throat swelling, SOB or lightheadedness with hypotension: Yes Has patient had a PCN reaction causing severe rash involving mucus membranes  or skin necrosis: No Has patient had a PCN reaction that required hospitalization: No Has patient had a PCN reaction occurring within the last 10 years: No If all of the above answers are "NO", then may proceed with Cephalosporin use.     ROS  As noted in HPI.   Physical Exam  BP (!) 160/86 (BP Location: Right Arm)   Pulse 73   Temp 98.4 F (36.9 C) (Oral)   Resp 18   Ht 5\' 7"  (1.702 m)   Wt 111.1 kg   LMP 04/01/2008 Comment: Hysterectomy was due to endometriosis. - still has ovaries.   SpO2 100%   BMI 38.37 kg/m   Constitutional: Well developed, well nourished, no acute distress Eyes: PERRLA EOMI, conjunctiva normal bilaterally HENT: Normocephalic, atraumatic,mucus membranes moist.  TMs normal bilaterally. Respiratory: Normal inspiratory effort, lungs clear bilaterally. Cardiovascular: Normal rate, regular rhythm no murmurs rubs or gallops GI: nondistended soft, nontender skin: No rash, skin intact Musculoskeletal: no deformities Neurologic: Alert & oriented x 3, cranial nerves III through XII intact.  No pronator drift upper or lower extremities.  Heel shin within normal limits.  Romberg negative.  Difficulty with finger-nose on the right side and with tandem gait.  No frank ataxia. Psychiatric: Speech and behavior appropriate   ED Course   Medications - No data to display  Orders Placed This Encounter  Procedures  . Glucose,  capillary    Standing Status:   Standing    Number of Occurrences:   1  . CBG monitoring, ED    Standing Status:   Standing    Number of Occurrences:   1  . ED EKG    Standing Status:   Standing    Number of Occurrences:   1    Order Specific Question:   Reason for Exam    Answer:   Shortness of breath  . EKG 12-Lead    Standing Status:   Standing    Number of Occurrences:   1    Results for orders placed or performed during the hospital encounter of 01/24/20 (from the past 24 hour(s))  Glucose, capillary     Status: None   Collection Time: 01/24/20  4:56 PM  Result Value Ref Range   Glucose-Capillary 93 70 - 99 mg/dL   No results found.  ED Clinical Impression  1. Discoordination   2. Chest pain, unspecified type      ED Assessment/Plan  Outside records reviewed.  As noted in HPI.  EKG: Normal sinus rhythm, rate 69.  Left axis deviation.  No hypertrophy.  No ST-T wave elevation.  Change compared to EKG from 01/01/2020.  Concern for the acute onset of discoordination starting at 830 this morning.  She states that she got better and then it has seemed to return.  She is not frankly ataxic however she is using extra effort to perform finger-nose and with tandem gait.  She has no other neurologic deficits.  She denies vertigo.  With a history of atrial fibrillation, hypercholesterolemia, concern for TIA.  She also is describing constant substernal/left-sided chest pain described as heaviness and as a way to my chest that is different than chest pain that she has had before.  She states this does not feel like her GERD symptoms.  Her EKG here is reassuring, however, feel that patient would benefit from observation and further work-up.  Transferring via EMS for cardiac monitoring on route and for monitoring of neurologic symptoms.  She drove herself here.  Discussed medical decision-making, rationale for transfer to the emergency department.  She agrees with plan.   No orders  of the defined types were placed in this encounter.   *This clinic note was created using Dragon dictation software. Therefore, there may be occasional mistakes despite careful proofreading.   ?    Melynda Ripple, MD 01/25/20 [phone]

## 2020-01-24 NOTE — ED Triage Notes (Signed)
Patient in today c/o atrial fib last night, unsteady on her feet and sob x today. Patient has a known history of atrial fib.

## 2020-01-24 NOTE — Telephone Encounter (Signed)
Noted  KP 

## 2020-01-24 NOTE — ED Notes (Signed)
Patient is being discharged from the Urgent Care and sent to the Emergency Department via EMS. Per Dr. Alphonzo Cruise, patient is in need of higher level of care due to chest pain and h/o Afib. Patient is aware and verbalizes understanding of plan of care.  Vitals:   01/24/20 1620  BP: (!) 160/86  Pulse: 73  Resp: 18  Temp: 98.4 F (36.9 C)  SpO2: 100%

## 2020-01-25 ENCOUNTER — Emergency Department
Admission: EM | Admit: 2020-01-25 | Discharge: 2020-01-25 | Disposition: A | Discharge status: Home / Self Care | Payer: BC Managed Care – PPO | Attending: Emergency Medicine | Admitting: Emergency Medicine

## 2020-01-25 NOTE — ED Notes (Signed)
No answer when called several times from lobby 

## 2020-01-26 ENCOUNTER — Other Ambulatory Visit: Payer: Self-pay

## 2020-01-26 ENCOUNTER — Ambulatory Visit (INDEPENDENT_AMBULATORY_CARE_PROVIDER_SITE_OTHER): Payer: BC Managed Care – PPO | Admitting: Nurse Practitioner

## 2020-01-26 ENCOUNTER — Encounter: Payer: Self-pay | Admitting: Nurse Practitioner

## 2020-01-26 VITALS — BP 138/84 | HR 66 | Ht 67.0 in | Wt 253.0 lb

## 2020-01-26 DIAGNOSIS — R0789 Other chest pain: Secondary | ICD-10-CM

## 2020-01-26 DIAGNOSIS — I480 Paroxysmal atrial fibrillation: Secondary | ICD-10-CM | POA: Diagnosis not present

## 2020-01-26 DIAGNOSIS — I10 Essential (primary) hypertension: Secondary | ICD-10-CM

## 2020-01-26 MED ORDER — APIXABAN 5 MG PO TABS
5.0000 mg | ORAL_TABLET | Freq: Two times a day (BID) | ORAL | 3 refills | Status: DC
Start: 2020-01-26 — End: 2020-06-20

## 2020-01-26 MED ORDER — FLECAINIDE ACETATE 50 MG PO TABS: 50.0000 mg | ORAL_TABLET | Freq: Two times a day (BID) | ORAL | 3 refills | Status: DC

## 2020-01-26 NOTE — Progress Notes (Signed)
Office Visit    Patient Name: Heather Dudley Date of Encounter: 01/26/2020  Primary Care Provider:  Glean Hess, MD Primary Cardiologist:  Ida Rogue, MD  Chief Complaint    59 year-old female with a history of palpitations, paroxysmal atrial fibrillation on Xarelto and prn flecanide, hypertension, hyperlipidemia, mild OSA not on CPAP, obesity, and breast cancer s/p chemo and radiation, who presents for follow-up related to palpitations and paroxysmal atrial fibrillation.   Past Medical History    Past Medical History:  Diagnosis Date  . Basal cell carcinoma of forehead    birthmark of forehead  . Breast cancer ([place])   . Breast screening, unspecified   . Cellulitis and abscess of trunk   . Diastolic dysfunction    a. 05/2019 Echo: EF 55-60%, no rwma, mild LVH, Gr2 DD, Nl RV size/fxn. Triv MR.  [name] Hernia   . History of chemotherapy 2012  . Hypertension   . Lump or mass in breast   . Malignant neoplasm of breast (female), unspecified site    She underwent wide excision, mastoplasty and axillary dissection for her left breast malignancy on September 03, 2010. The primary tumor was 1 cm in diameter, and a single positive axillary node 1.3 cm in diameter. This was an ER-positive, PR slightly positive, HER-2/neu not overexpressing tumor. She tolerated her whole breast radiation without difficulty ending in late February 2013.  . Malignant neoplasm of upper-outer quadrant of female breast ([place])   . 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 Obesity, unspecified   . PAF (paroxysmal atrial fibrillation) ([place]) 05/04/2017   a. 04/2019 Zio: RSR, avg HR 79. 2% PAF burden (73-175, avg 115; longest 74mns 28secs). CHA2DS2VASc = 2-->Xarelto.  . Personal history of chemotherapy   . Personal history of malignant neoplasm of breast    The patient underwent wide excision, mastoplasty. Dissection for a T1b, N1, M0 carcinoma left breast on September 03, 2010.The  primary tumor was 1 cm in diameter, and a single positive axillary node 1.3 cm in diameter. This was an ER-positive, PR slightly positive, HER-2/neu not overexpressing tumor.  The patient completed whole breast radiation in February 2013.  .Marland KitchenPersonal history of radiation therapy 2013   LEFT lumpectomy  . Screening for obesity   . Thyroid nodule    Past Surgical History:  Procedure Laterality Date  . ABDOMINAL HYSTERECTOMY  2011  . birth mark removed    . BREAST CYST ASPIRATION Right    negative 01/2010  . BREAST EXCISIONAL BIOPSY  August 05, 2010   left breast positive 07/2010  . BREAST LUMPECTOMY  2012   left breast  . COLONOSCOPY  July 29, 2012   normal study.  . robotic tonsillectomy Lingual tonsils Bilateral 06/08/2016  . THYROID SURGERY     partially removed  . TUBAL LIGATION      Allergies  Allergies  Allergen Reactions  . Ace Inhibitors Swelling  . Sernivo [Betamethasone Dipropionate] Itching  . Penicillins Rash    Has patient had a PCN reaction causing immediate rash, facial/tongue/throat swelling, SOB or lightheadedness with hypotension: Yes Has patient had a PCN reaction causing severe rash involving mucus membranes or skin necrosis: No Has patient had a PCN reaction that required hospitalization: No Has patient had a PCN reaction occurring within the last 10 years: No If all of the above answers are "NO", then may proceed with Cephalosporin use.    History of Present Illness  59 year-old female with the above past medical history including palpitations, paroxysmal atrial fibrillation on Xarelto and prn flecanide, hypertension, hyperlipidemia, mild OSA not on CPAP, obesity, thyroid nodule s/p subtotal thyroidectomy, and breast cancer s/p chemo and radiation. Cardiac history dates back to January 2019 when she presented to the ED after experiencing sudden onset palpitations and chest pain. Cardiac work-up at the time was unremarkable other than chronically low  potassium. She subsequently followed up in the clinic and was started on propanolol as needed. In March 2021 she was seen in clinic for follow-up and reported an increase in palpitations. Her EKG at that visit showed atrial fibrillation with RVR, which resolved spontaneously with restoration of NSR. She was started on xarelto [CHA2DS2VASc of 2 (HTN, female)] and metoprolol. A Zio monitor was placed which showed paroxysmal atrial fibrillation with a 2% burden. Echo on 05/2019 showed an EF of 55-60%, grade II DD (pseudonormalization), elevated left atrial pressure, RVSP 41 mm Hg, and trivial mitral and tricuspid valve regurgitation. TSH at the time was normal.    She was last seen in clinic on 01/01/2020. She reported ongoing palpitations, with episodes 2-3 times a week, requiring prn propanolol. The addition of an antiarrythmic was discussed, however she declined taking a daily antiarrythmic, and opted to take flecainide 100 mg twice daily as needed for PAF/palpitations. If symptoms persisted, or frequency of flecainide use increased to regular use, she was instructed to call the clinic, with plans to schedule a routine treadmill study at that time.  Since her last visit she has had several episodes of atrial fibrillation. She reports and average 2-3 episodes a week, varying in severity, stating she sometimes takes propanolol, however, she feels it no longer works. The night of 01/23/2020 she had an episode of atrial fibrillation that lasted most of the night with associated shortness of breath and chest heaviness. She took a fleccanide 100 mg (only her second time since receiving the prescription) and eventually her a fib broke. She went to work the next day feeling tired. At the end of the day, when she was walking to her car, she felt fatigued and mildly unsteady, as though she was veering to the right. She denies any associated weakness, numbness, vision changes, or slurred speech at the time. She later  presented to her PCP who referred her to urgent care (01/24/2020) for ongoing shortness of breath and atypical chest pain, which she described as a heaviness and soreness to touch. Given her symptoms, she was transferred to the ED where, after waiting for several hours, she left before seeing a physician. Labs were drawn, including a HsTpn, which was normal. Chest x-ray was unremarkable. She thought her symptoms could be related to anxiety and took an ativan when she got home. She slept that night and woke up the next day feeling tired, but more like herself. She scheduled an appointment with the clinic for follow-up.  She states she is back to her baseline today. She denies chest pain, dyspnea, pnd, orthopnea, n, v, dizziness, syncope, edema, weight gain, or early satiety. Of note, she was recently diagnosed with mild sleep apnea following a sleep study to be managed with lifestyle modifications. She also thinks that her xarelto "does not agree" with her and is asking if there is something else she can take, though she is adherent to her current medication regimen.   Home Medications    Prior to Admission medications   Medication Sig Start Date End Date Taking? Authorizing Provider  amLODipine (NORVASC) 5 MG tablet Take 1 tablet (5 mg total) by mouth daily. 07/11/19   Glean Hess, MD  B Complex-Biotin-FA (B-COMPLEX PO) Take 1 tablet by mouth daily.    [provider]  CHOLECALCIFEROL PO Take 4,000 Units by mouth daily.    [provider]  flecainide (TAMBOCOR) 100 MG tablet Take 1 tablet (100 mg total) by mouth 2 (two) times daily as needed. 01/01/20   Minna Merritts, MD  hydrochlorothiazide (MICROZIDE) 12.5 MG capsule Take 1 capsule (12.5 mg total) by mouth daily. 06/20/19 01/24/20  Minna Merritts, MD  levothyroxine (SYNTHROID) 25 MCG tablet Take 25 mcg by mouth daily before breakfast.    [provider]  LORazepam (ATIVAN) 0.5 MG tablet Take 1 tablet (0.5 mg total)  by mouth every 8 (eight) hours as needed for anxiety. 05/19/19   Glean Hess, MD  metoprolol succinate (TOPROL-XL) 50 MG 24 hr tablet Take 1.5 tablets (75 mg total) by mouth daily. Take with or immediately following a meal. 06/20/19 01/24/20  Minna Merritts, MD  Multiple Vitamin (MULTIVITAMIN) tablet Take 1 tablet by mouth daily.    [provider]  potassium chloride SA (KLOR-CON) 20 MEQ tablet Take 1 tablet (20 mEq total) by mouth daily. 06/20/19   Minna Merritts, MD  propranolol (INDERAL) 20 MG tablet Take 1 tablet (20 mg total) by mouth 3 (three) times daily as needed. 05/22/19   Gollan, Kathlene November, MD  XARELTO 20 MG TABS tablet TAKE 1 TABLET (20 MG TOTAL) BY MOUTH DAILY WITH SUPPER. 01/08/20   Loel Dubonnet, NP    Review of Systems    She denies chest pain, dyspnea, pnd, orthopnea, n, v, dizziness, syncope, edema, weight gain, or early satiety.  All other systems reviewed and are otherwise negative except as noted above.  Physical Exam    VS:  BP 138/84 (BP Location: Right Arm, Patient Position: Sitting, Cuff Size: Large)   Pulse 66   Ht _0  (1.702 m)   Wt 253 lb (114.8 kg)   LMP 04/01/2008 Comment: Hysterectomy was due to endometriosis. - still has ovaries.   SpO2 98%   BMI 39.63 kg/m  , BMI Body mass index is 39.63 kg/m. GEN: Obese, well developed, in no acute distress. HEENT: normal. Neck: Supple, no JVD, carotid bruits, or masses. Cardiac: RRR, no murmurs, rubs, or gallops. No clubbing, cyanosis, edema. PT 1+ and equal bilaterally.  Respiratory:  Respirations regular and unlabored, bilateral rhonchi. GI: Soft, nontender, nondistended, BS + x 4. MS: no deformity or atrophy. Skin: warm and dry, no rash. Neuro:  Strength and sensation are intact. Psych: Normal affect.  Accessory Clinical Findings    ECG personally reviewed by me today - Normal sinus rhythm, 66 - no acute changes.  Lab Results  Component Value Date   WBC 9.6 01/24/2020   HGB 12.7  01/24/2020   HCT 39.3 01/24/2020   MCV 87.9 01/24/2020   PLT 293 01/24/2020   Lab Results  Component Value Date   CREATININE 1.12 (H) 01/24/2020   BUN 18 01/24/2020   NA 137 01/24/2020   K 4.0 01/24/2020   CL 99 01/24/2020   CO2 29 01/24/2020   Lab Results  Component Value Date   ALT 11 07/11/2019   AST 15 07/11/2019   ALKPHOS 85 07/11/2019   BILITOT 0.4 07/11/2019   Lab Results  Component Value Date   CHOL 282 (H) 07/11/2019   HDL 51 07/11/2019  LDLCALC 211 (H) 07/11/2019   TRIG 111 07/11/2019   CHOLHDL 5.5 (H) 07/11/2019    Lab Results  Component Value Date   HGBA1C 5.8 (H) 07/11/2019    Assessment & Plan    1.  Paroxysmal atrial fibrillation: She reports ongoing episodes of symptomatic atrial fibrillation, 2-3 x per week, with the most recent resulting in a visit to the ED on 01/24/2020 in the setting of shortness of breath, fatigue with mild disequilibrium, and atypical chest pain despite having taken flecainide the night before. She left before seeing a physician, though her lab work (including HsTpn) and chest x-ray in the ED were unremarkable. She is back to her baseline today. She is currently taking metoprolol 75 mg daily, with fleccanide and propanolol prn. She is on xarelto, and has taken it without interruption, though she states it does not agree with her. Given her recurrent episodes of symptomatic atrial fibrillation, she is agreeable to start flecainide 50 mg bid. Continue metoprolol at current dose. Will schedule ETT for 1 week from now. After some discussion, she would like to switch her anticoagulation from xarelto to Eliquis. Will start Eliquis 5 mg twice daily. Follow-up in office in 1 month.   2. Atypical chest pain: She had an episode of atypical chest pain which she described as a heaviness, soreness to touch that coincided with her atrial fibrillation. HsTpn was normal in the ED, which is reassuring. Regardless, she will require an ETT for initiation of  flecainide. This will be scheduled for next week.   3. Essential hypertension: BP 138/84 today. She does not check her BP regularly at home. Continue amlodipine and metoprolol. Encouraged weight loss.   4. OSA: She recently had a sleep study which revealed mild sleep apnea. Lifestyle changes were recommended including weight loss and sleep hygiene/positioning. No recommendations for CPAP at this time.   5. Disposition: Begin Flecainide 50 mg twice daily. Will change xarelto to Eliquis 5 mg twice daily at her request. Schedule outpatient ETT for one week from now. Follow-up in clinic in 1 month or sooner if needed.   Murray Hodgkins, NP 01/26/2020, 5:09 PM

## 2020-01-26 NOTE — Patient Instructions (Signed)
Medication Instructions:  Your physician has recommended you make the following change in your medication:   1) START taking Flecainide 50mg  TWICE daily - an Rx for new dose has been sent to your pharmacy  2) STOP taking Xarelto  3) START taking (this evening) Eliquis 5mg  TWICE daily - you were given a coupon to use at the pharmacy when you pick up Rx  *If you need a refill on your cardiac medications before your next appointment, please call your pharmacy*   Lab Work: None ordered   Testing/Procedures:  Your provider has advised to schedule an Exercise Tolerance Test (Treadmill Stress Test) in 1-2 weeks: - you may eat a light breakfast/ lunch prior to your procedure - no caffeine for 24 hours prior to your test (coffee, tea, soft drinks, or chocolate)  - no smoking/ vaping for 4 hours prior to your test - you may take your regular medications the day of your test- bring any inhalers with you to your test - wear comfortable clothing & tennis/ non-skid shoes to walk on the treadmill   Follow-Up: At [facility], you and your health needs are our priority.  As part of our continuing mission to provide you with exceptional heart care, we have created designated Provider Care Teams.  These Care Teams include your primary Cardiologist (physician) and Advanced Practice Providers (APPs -  Physician Assistants and Nurse Practitioners) who all work together to provide you with the care you need, when you need it.  We recommend signing up for the patient portal called "MyChart".  Sign up information is provided on this After Visit Summary.  MyChart is used to connect with patients for Virtual Visits (Telemedicine).  Patients are able to view lab/test results, encounter notes, upcoming appointments, etc.  Non-urgent messages can be sent to your provider as well.   To learn more about what you can do with MyChart, go to [email or web address].    Your next appointment:   1 month(s)  The  format for your next appointment:   In Person  Provider:   You may see Ida Rogue, MD or one of the following Advanced Practice Providers on your designated Care Team:    Murray Hodgkins, NP

## 2020-02-05 ENCOUNTER — Other Ambulatory Visit
Admission: RE | Admit: 2020-02-05 | Payer: BC Managed Care – PPO | Source: Ambulatory Visit | Attending: Internal Medicine | Admitting: Internal Medicine

## 2020-02-09 ENCOUNTER — Other Ambulatory Visit: Payer: BC Managed Care – PPO

## 2020-02-12 ENCOUNTER — Other Ambulatory Visit
Admission: RE | Admit: 2020-02-12 | Discharge: 2020-02-12 | Disposition: A | Discharge status: Home / Self Care | Payer: BC Managed Care – PPO | Source: Ambulatory Visit | Attending: Cardiovascular Disease | Admitting: Cardiovascular Disease

## 2020-02-12 ENCOUNTER — Other Ambulatory Visit: Payer: Self-pay

## 2020-02-12 ENCOUNTER — Telehealth: Payer: Self-pay | Admitting: Cardiovascular Disease

## 2020-02-12 DIAGNOSIS — Z01812 Encounter for preprocedural laboratory examination: Secondary | ICD-10-CM | POA: Insufficient documentation

## 2020-02-12 DIAGNOSIS — Z20822 Contact with and (suspected) exposure to covid-19: Secondary | ICD-10-CM | POA: Insufficient documentation

## 2020-02-12 LAB — SARS CORONAVIRUS 2 (TAT 6-24 HRS): SARS Coronavirus 2: NEGATIVE

## 2020-02-12 NOTE — Telephone Encounter (Signed)
Patient calling  Patient has a treadmill test tomorrow 12/21 - please review covid results and treadmill instructions Please call to discuss

## 2020-02-12 NOTE — Telephone Encounter (Signed)
Pt called back regarding her VM for her appt tomorrow with her treadmill stress test, pt reports she confirms her appt at 3pm 12/21. Wanted to know the results of her COVID, advised that the results were not uploaded at this time. Advised that it could be tomorrow morning before results are available. Pt verbalized understanding, will call back for anything further.

## 2020-02-13 ENCOUNTER — Ambulatory Visit: Payer: BC Managed Care – PPO

## 2020-02-13 ENCOUNTER — Other Ambulatory Visit: Payer: Self-pay

## 2020-02-13 ENCOUNTER — Telehealth: Payer: Self-pay

## 2020-02-13 DIAGNOSIS — I480 Paroxysmal atrial fibrillation: Secondary | ICD-10-CM

## 2020-02-13 MED ORDER — PROPRANOLOL HCL 20 MG PO TABS: 20.0000 mg | ORAL_TABLET | Freq: Three times a day (TID) | ORAL | 0 refills | Status: DC | PRN

## 2020-02-13 NOTE — Telephone Encounter (Signed)
Patient showed up for her scheduled flecainide treadmill test today. Patient was started on flecainide on 01/26/20 for PAF and the f/u gxt was ordered by Ignacia Bayley, NP.  Patient sts that she only took the medication for 3 days and then stopped. Patient sts that she was having confusion and "just did not feel right". She believes this was related to the medication.  Patient has not taken flecainide in 2 weeks at the time of the scheduled treadmill test.  Called and discussed with Dr. Fletcher Anon. Per Dr. Fletcher Anon there is no need to perform the gxt. She is scheduled to see Dr. Rockey Situ on 03/05/19. Adv the patient to f/u with Dr. Rockey Situ as planned. She is to contact the office sooner if symptoms worsen. Patient rqst a refill of her prn propanolol be sent to her pharmacy.

## 2020-03-03 NOTE — Progress Notes (Signed)
Cardiology Office Note  Date:  03/04/2020   ID:  Heather Dudley, DOB Sep 11, 1960, MRN [id number]  PCP:  Glean Hess, MD   Chief Complaint  Patient presents with   Other    1 month follow up. Meds reviewed verbally with patient.     HPI:  Heather Dudley is a 60 y.o. female  HTN Nonsmoker No diabetes History of breast cancer on the left, lumpectomy and lymph node dissection.  Completed chemotherapy remote chest pain Dx with CPAP, not on CPAP, 1-2 years ago Presents for f/u of her chest pain, tachycardia, paroxysmal atrial fibrillation  Last seen in clinic November 2021 At that time having episodes of atrial fibrillation lasting up to 1 hour, taking extra propranolol Was compliant with Xarelto and metoprolol 75 mg daily  Prefered not to take antiarrhythmic on a regular basis As flecainide It was recommended if she started to take this on a regular basis that she call our office, would need additional work-up including treadmill  Seen in emergency room January 24, 2020 Had discoordination, anxiety, atypical chest pain Night before had episode of atrial fibrillation  Seen by one of our providers January 26, 2020 Recommended she start her flecainide 50 twice daily Changed from Xarelto to Eliquis for unclear intolerance She tried flecainide for 3 days, did not feel well and stopped the flecainide  Tolerating metoprolol succinate 301 daily certain foods make it worse, triggers  Sometimes tightens, "in a ball",  Happened with [place]  EKG personally reviewed by myself on todays visit NSR rate 78 bpm no significant ST-T wave changes  Other past medical history reviewed Echo April 2021 Left ventricular ejection fraction, by estimation, is 55 to 60%. The  left ventricle has normal function Left ventricular diastolic parameters  are consistent with Grade II diastolic dysfunction (pseudonormalization).  moderately elevated pulmonary artery systolic  pressure.   Monitor reviewed avg HR of 79 bpm. Predominant underlying rhythm was Sinus Rhythm. Atrial Fibrillation occurred (2% burden), ranging from 73-175 bpm (avg of 115 bpm), the longest lasting 52 mins 28 secs with an avg rate of 118 bpm.  CT chest:  No significant coronary calcified atherosclerosis or aortic plaque  History of lumpectomy and lymph node dissection.  Sometimes this causes discomfort in her left arm  PMH:   has a past medical history of Basal cell carcinoma of forehead, Breast cancer ([place]), Breast screening, unspecified, Cellulitis and abscess of trunk, Diastolic dysfunction, Hernia, History of chemotherapy (2012), Hypertension, Lump or mass in breast, Malignant neoplasm of breast (female), unspecified site, Malignant neoplasm of upper-outer quadrant of female breast ([place]), Neoplasm of uncertain behavior of connective and other soft tissue, Obesity, unspecified, PAF (paroxysmal atrial fibrillation) ([place]) (05/04/2017), Personal history of chemotherapy, Personal history of malignant neoplasm of breast, Personal history of radiation therapy (2013), Screening for obesity, and Thyroid nodule.  PSH:    Past Surgical History:  Procedure Laterality Date   ABDOMINAL HYSTERECTOMY  2011   birth mark removed     BREAST CYST ASPIRATION Right    negative 01/2010   BREAST EXCISIONAL BIOPSY  August 05, 2010   left breast positive 07/2010   BREAST LUMPECTOMY  2012   left breast   COLONOSCOPY  July 29, 2012   normal study.   robotic tonsillectomy Lingual tonsils Bilateral 06/08/2016   THYROID SURGERY     partially removed   TUBAL LIGATION      Current Outpatient Medications  Medication Sig Dispense Refill   amLODipine (NORVASC)  5 MG tablet Take 1 tablet (5 mg total) by mouth daily. 90 tablet 1   apixaban (ELIQUIS) 5 MG TABS tablet Take 1 tablet (5 mg total) by mouth 2 (two) times daily. 60 tablet 3   B Complex-Biotin-FA (B-COMPLEX PO) Take 1 tablet by mouth daily.      CHOLECALCIFEROL PO Take 4,000 Units by mouth daily.     hydrochlorothiazide (MICROZIDE) 12.5 MG capsule Take 1 capsule (12.5 mg total) by mouth daily. 90 capsule 3   levothyroxine (SYNTHROID) 50 MCG tablet Take 50 mcg by mouth daily.     LORazepam (ATIVAN) 0.5 MG tablet Take 1 tablet (0.5 mg total) by mouth every 8 (eight) hours as needed for anxiety. 20 tablet 0   Multiple Vitamin (MULTIVITAMIN) tablet Take 1 tablet by mouth daily.     potassium chloride SA (KLOR-CON) 20 MEQ tablet Take 1 tablet (20 mEq total) by mouth daily. 90 tablet 3   propranolol (INDERAL) 20 MG tablet Take 1 tablet (20 mg total) by mouth 3 (three) times daily as needed. 90 tablet 0   metoprolol succinate (TOPROL-XL) 100 MG 24 hr tablet Take 1 tablet (100 mg total) by mouth daily. Take with or immediately following a meal. 90 tablet 3   No current facility-administered medications for this visit.     Allergies:   Ace inhibitors, Sernivo [betamethasone dipropionate], and Penicillins   Social History:  The patient  reports that she has never smoked. She has never used smokeless tobacco. She reports that she does not drink alcohol and does not use drugs.   Family History:   family history includes Cancer in her cousin, maternal uncle, mother, and paternal aunt; Colon cancer in an other family member; Heart failure in her father; Ovarian cancer in an other family member.    Review of Systems: Review of Systems  Constitutional: Negative.   HENT: Negative.   Respiratory: Negative.   Cardiovascular: Positive for chest pain and palpitations.  Gastrointestinal: Negative.   Musculoskeletal: Negative.   Neurological: Negative.   Psychiatric/Behavioral: Negative.   All other systems reviewed and are negative.   PHYSICAL EXAM: VS:  BP 134/74 (BP Location: Left Arm, Patient Position: Sitting, Cuff Size: Normal)    Pulse 78    Ht 5\' 7"  (1.702 m)    Wt 253 lb (114.8 kg)    LMP 04/01/2008 Comment: Hysterectomy was due  to endometriosis. - still has ovaries.    SpO2 97%    BMI 39.63 kg/m  , BMI Body mass index is 39.63 kg/m. Constitutional:  oriented to person, place, and time. No distress.  HENT:  Head: Grossly normal Eyes:  no discharge. No scleral icterus.  Neck: No JVD, no carotid bruits  Cardiovascular: Regular rate and rhythm, no murmurs appreciated Pulmonary/Chest: Clear to auscultation bilaterally, no wheezes or rails Abdominal: Soft.  no distension.  no tenderness.  Musculoskeletal: Normal range of motion Neurological:  normal muscle tone. Coordination normal. No atrophy Skin: Skin warm and dry Psychiatric: normal affect, pleasant   Recent Labs: 05/23/2019: TSH 2.060 07/11/2019: ALT 11 01/24/2020: BUN 18; Creatinine, Ser 1.12; Hemoglobin 12.7; Platelets 293; Potassium 4.0; Sodium 137    Lipid Panel Lab Results  Component Value Date   CHOL 282 (H) 07/11/2019   HDL 51 07/11/2019   LDLCALC 211 (H) 07/11/2019   TRIG 111 07/11/2019     Wt Readings from Last 3 Encounters:  03/04/20 253 lb (114.8 kg)  01/26/20 253 lb (114.8 kg)  01/24/20 245 lb (111.1 kg)  ASSESSMENT AND PLAN:  Paroxysmal atrial fibrillation On metoprolol with Eliquis Did not tolerate flecainide 50 twice daily Discussed other antiarrhythmics that might be available Could consider propafenone, [place] Will stay on metoprolol succinate 100 for now with propranolol She has been getting along okay, minimal symptoms  Chest pain, unspecified type - CT scan chest reviewed no coronary calcification, no aortic atherosclerosis Recent episodes atypical discomfort, EKG unchanged, enzymes normal Typically presents at rest  Hyperlipidemia, mixed She prefers no medication at this time Lifestyle modification recommended  Essential hypertension - Plan: EKG 12-Lead continue metoprolol with low-dose HCTZ  Malignant neoplasm of left breast in female, estrogen receptor positive, unspecified site of breast ([place]) Previous  chemotherapy and radiation on the left Lymphedema left arm Stable  Obesity Lifestyle modification recommended   Total encounter time more than 25 minutes  Greater than 50% was spent in counseling and coordination of care with the patient   Orders Placed This Encounter  Procedures   EKG 12-Lead     Signed, Esmond Plants, M.D., Ph.D. 03/04/2020  [place], [place]

## 2020-03-04 ENCOUNTER — Encounter: Payer: Self-pay | Admitting: Cardiovascular Disease

## 2020-03-04 ENCOUNTER — Ambulatory Visit (INDEPENDENT_AMBULATORY_CARE_PROVIDER_SITE_OTHER): Payer: BC Managed Care – PPO | Admitting: Cardiovascular Disease

## 2020-03-04 ENCOUNTER — Other Ambulatory Visit: Payer: Self-pay

## 2020-03-04 VITALS — BP 134/74 | HR 78 | Ht 67.0 in | Wt 253.0 lb

## 2020-03-04 DIAGNOSIS — I480 Paroxysmal atrial fibrillation: Secondary | ICD-10-CM | POA: Diagnosis not present

## 2020-03-04 DIAGNOSIS — I479 Paroxysmal tachycardia, unspecified: Secondary | ICD-10-CM

## 2020-03-04 DIAGNOSIS — R079 Chest pain, unspecified: Secondary | ICD-10-CM | POA: Diagnosis not present

## 2020-03-04 DIAGNOSIS — E782 Mixed hyperlipidemia: Secondary | ICD-10-CM

## 2020-03-04 DIAGNOSIS — I10 Essential (primary) hypertension: Secondary | ICD-10-CM | POA: Diagnosis not present

## 2020-03-04 MED ORDER — METOPROLOL SUCCINATE ER 100 MG PO TB24: 100.0000 mg | ORAL_TABLET | Freq: Every day | ORAL | 3 refills | Status: DC

## 2020-03-04 NOTE — Patient Instructions (Addendum)
Medication Instructions:  Stay on metoprolol succinate 100 mg once a day 9Rx sent to pharmacy)  You were given a coupon for eliquis, call the number on card to activate to see if you qualify for $10 co-pay.  Kristopher Oppenheim also has [facility] that may be able to reduce cost of Eliquis   ILab work: None  Testing/Procedures: None   Follow-Up: At [facility], you and your health needs are our priority.  As part of our continuing mission to provide you with exceptional heart care, we have created designated Provider Care Teams.  These Care Teams include your primary Cardiologist (physician) and Advanced Practice Providers (APPs -  Physician Assistants and Nurse Practitioners) who all work together to provide you with the care you need, when you need it.  . You will need a follow up appointment in 12 months  . Providers on your designated Care Team:   . Murray Hodgkins, NP . Christell Faith, PA-C . Marrianne Mood, PA-C  Any Other Special Instructions Will Be Listed Below (If Applicable).  COVID-19 Vaccine Information can be found at: [email or web address].co.uk For questions related to vaccine distribution or appointments, please email [email or web address] or call [phone].

## 2020-03-06 ENCOUNTER — Other Ambulatory Visit: Payer: Self-pay | Admitting: Cardiovascular Disease

## 2020-03-07 ENCOUNTER — Ambulatory Visit
Admission: EM | Admit: 2020-03-07 | Discharge: 2020-03-07 | Disposition: A | Discharge status: Home / Self Care | Payer: BC Managed Care – PPO | Attending: Sports Medicine | Admitting: Sports Medicine

## 2020-03-07 ENCOUNTER — Other Ambulatory Visit: Payer: Self-pay

## 2020-03-07 DIAGNOSIS — J069 Acute upper respiratory infection, unspecified: Secondary | ICD-10-CM | POA: Insufficient documentation

## 2020-03-07 DIAGNOSIS — J029 Acute pharyngitis, unspecified: Secondary | ICD-10-CM | POA: Insufficient documentation

## 2020-03-07 DIAGNOSIS — H9201 Otalgia, right ear: Secondary | ICD-10-CM | POA: Insufficient documentation

## 2020-03-07 DIAGNOSIS — Z20822 Contact with and (suspected) exposure to covid-19: Secondary | ICD-10-CM | POA: Diagnosis not present

## 2020-03-07 LAB — GROUP A STREP BY PCR: Group A Strep by PCR: NOT DETECTED

## 2020-03-07 NOTE — ED Triage Notes (Signed)
Pt is here with a sore throat and ear pain that started Monday, pt has taken OTC meds to relieve discomfort.

## 2020-03-07 NOTE — Discharge Instructions (Signed)
I indicated to her that her throat actually looked fairly normal.  Both ears look good as well. I felt it reasonable to go ahead and get a strep test.  This was negative. Given the history of being in the emergency room, I recommended going ahead and getting a COVID test.  This will take between 6 and 24 hours to return from the hospital.  In the interim she needs to isolate pending the results.  If they are negative she can go back to work on Monday, January 17 with a mask.  If there are positive, someone will give her an updated work note.  She will need to isolate and quarantine per current CDC guidelines. Supportive care, over-the-counter meds as needed, Tylenol or Motrin for fever or discomfort. Follow-up here as needed.

## 2020-03-07 NOTE — ED Provider Notes (Signed)
[facility]    CSN: [id number] Arrival date & time: 03/07/20  1610      History   Chief Complaint Chief Complaint  Patient presents with  . Otalgia  . Sore Throat    HPI Heather Dudley is a 60 y.o. female.   Patient pleasant 60 year old female who presents for evaluation of 2 to 3 days of right-sided sore throat and right ear pain.  No fever shakes or chills.  No cough chest pain shortness of breath or fever.  No nausea vomiting or diarrhea.  No abdominal or urinary symptoms.  Question of whether or not she has had COVID exposure.  She was in an emergency room in [place] with her mom and there was no social distancing.  That said she has been vaccinated twice.  No influenza vaccine.  She has no COVID history.  Given that she has had several days of the symptoms that are just localized to the right side of her throat and her ear her work colleagues had encouraged her to come in today to be evaluated to get testing.  No red flag signs or symptoms.  No chest pain or shortness of breath.     Past Medical History:  Diagnosis Date  . Basal cell carcinoma of forehead    birthmark of forehead  . Breast cancer ([place])   . Breast screening, unspecified   . Cellulitis and abscess of trunk   . Diastolic dysfunction    a. 05/2019 Echo: EF 55-60%, no rwma, mild LVH, Gr2 DD, Nl RV size/fxn. Triv MR.  [name] Hernia   . History of chemotherapy 2012  . Hypertension   . Lump or mass in breast   . Malignant neoplasm of breast (female), unspecified site    She underwent wide excision, mastoplasty and axillary dissection for her left breast malignancy on September 03, 2010. The primary tumor was 1 cm in diameter, and a single positive axillary node 1.3 cm in diameter. This was an ER-positive, PR slightly positive, HER-2/neu not overexpressing tumor. She tolerated her whole breast radiation without difficulty ending in late February 2013.  . Malignant neoplasm of upper-outer quadrant of  female breast ([place])   . 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 Obesity, unspecified   . PAF (paroxysmal atrial fibrillation) ([place]) 05/04/2017   a. 04/2019 Zio: RSR, avg HR 79. 2% PAF burden (73-175, avg 115; longest 70mins 28secs). CHA2DS2VASc = 2-->Xarelto.  . Personal history of chemotherapy   . Personal history of malignant neoplasm of breast    The patient underwent wide excision, mastoplasty. Dissection for a T1b, N1, M0 carcinoma left breast on September 03, 2010.The primary tumor was 1 cm in diameter, and a single positive axillary node 1.3 cm in diameter. This was an ER-positive, PR slightly positive, HER-2/neu not overexpressing tumor.  The patient completed whole breast radiation in February 2013.  [name] Personal history of radiation therapy 2013   LEFT lumpectomy  . Screening for obesity   . Thyroid nodule     Patient Active Problem List   Diagnosis Date Noted  . Colon cancer screening 07/13/2019  . Paroxysmal atrial fibrillation ([place]) 07/11/2019  . Multiple thyroid nodules 08/23/2018  . Mixed hyperlipidemia 07/06/2018  . Microscopic hematuria 05/04/2018  . Liver hemangioma 03/26/2017  . Anxiety attack 06/15/2016  . Macroglossia 02/05/2016  . Environmental and seasonal allergies 01/02/2016  . Hot flashes related to aromatase inhibitor therapy 09/08/2015  . Vitamin D deficiency  04/19/2015  . Cervical radiculopathy 04/17/2015  . Essential hypertension 04/17/2015  . History of partial thyroidectomy 04/17/2015  . Obesity, Class II, BMI 35-39.9 04/17/2015  . Lipoma of axilla 10/24/2014  . History of left breast cancer 09/02/2010    Past Surgical History:  Procedure Laterality Date  . ABDOMINAL HYSTERECTOMY  2011  . birth mark removed    . BREAST CYST ASPIRATION Right    negative 01/2010  . BREAST EXCISIONAL BIOPSY  August 05, 2010   left breast positive 07/2010  . BREAST LUMPECTOMY  2012   left breast  . COLONOSCOPY  July 29, 2012   normal study.   . robotic tonsillectomy Lingual tonsils Bilateral 06/08/2016  . THYROID SURGERY     partially removed  . TUBAL LIGATION      OB History    Gravida  4   Para  3   Term      Preterm      AB  1   Living  3     SAB      IAB  1   Ectopic      Multiple      Live Births           Obstetric Comments  Age of first pregnancy 77 Age of first period 41         Home Medications    Prior to Admission medications   Medication Sig Start Date End Date Taking? Authorizing Provider  amLODipine (NORVASC) 5 MG tablet Take 1 tablet (5 mg total) by mouth daily. 07/11/19   Glean Hess, MD  apixaban (ELIQUIS) 5 MG TABS tablet Take 1 tablet (5 mg total) by mouth 2 (two) times daily. 01/26/20 05/25/20  Theora Gianotti, NP  B Complex-Biotin-FA (B-COMPLEX PO) Take 1 tablet by mouth daily.    [provider]  CHOLECALCIFEROL PO Take 4,000 Units by mouth daily.    [provider]  hydrochlorothiazide (MICROZIDE) 12.5 MG capsule Take 1 capsule (12.5 mg total) by mouth daily. 06/20/19 01/26/20  Minna Merritts, MD  levothyroxine (SYNTHROID) 50 MCG tablet Take 50 mcg by mouth daily. 12/06/19   [provider]  LORazepam (ATIVAN) 0.5 MG tablet Take 1 tablet (0.5 mg total) by mouth every 8 (eight) hours as needed for anxiety. 05/19/19   Glean Hess, MD  metoprolol succinate (TOPROL-XL) 100 MG 24 hr tablet Take 1 tablet (100 mg total) by mouth daily. Take with or immediately following a meal. 03/04/20   Gollan, Kathlene November, MD  Multiple Vitamin (MULTIVITAMIN) tablet Take 1 tablet by mouth daily.    [provider]  potassium chloride SA (KLOR-CON) 20 MEQ tablet Take 1 tablet (20 mEq total) by mouth daily. 06/20/19   Minna Merritts, MD  propranolol (INDERAL) 20 MG tablet TAKE 1 TABLET BY MOUTH 3 TIMES DAILY AS NEEDED. 03/06/20   Minna Merritts, MD    Family History Family History  Problem Relation Age of Onset  . Colon cancer Other   .  Ovarian cancer Other   . Cancer Mother        lung ca  . Cancer Maternal Uncle        lung ca  . Cancer Paternal Aunt        ovarian ca  . Cancer Cousin        female ca  . Heart failure Father   . Breast cancer Neg Hx     Social History Social History  Tobacco Use  . Smoking status: Never Smoker  . Smokeless tobacco: Never Used  Vaping Use  . Vaping Use: Never used  Substance Use Topics  . Alcohol use: No    Alcohol/week: 0.0 standard drinks  . Drug use: No     Allergies   Ace inhibitors, Sernivo [betamethasone dipropionate], and Penicillins   Review of Systems Review of Systems  Constitutional: Negative for chills, fatigue and fever.  HENT: Positive for ear pain and sore throat. Negative for congestion, hearing loss, rhinorrhea, sinus pressure, sinus pain and sneezing.   Eyes: Negative for pain.  Respiratory: Negative for apnea, cough, chest tightness, shortness of breath, wheezing and stridor.   Cardiovascular: Negative for chest pain and palpitations.  Gastrointestinal: Negative for abdominal distention, abdominal pain, blood in stool, diarrhea, nausea and vomiting.  Genitourinary: Negative for dysuria, flank pain and pelvic pain.  Skin: Negative for color change, pallor, rash and wound.  Neurological: Negative for dizziness, weakness, light-headedness and headaches.  All other systems reviewed and are negative.    Physical Exam Triage Vital Signs ED Triage Vitals  Enc Vitals Group     BP 03/07/20 1724 (!) 152/86     Pulse Rate 03/07/20 1724 85     Resp 03/07/20 1724 (!) 21     Temp 03/07/20 1724 98.7 F (37.1 C)     Temp Source 03/07/20 1724 Oral     SpO2 03/07/20 1724 98 %     Weight --      Height --      Head Circumference --      Peak Flow --      Pain Score 03/07/20 1721 0     Pain Loc --      Pain Edu? --      Excl. in [place]? --    No data found.  Updated Vital Signs BP (!) 152/86 (BP Location: Right Arm)   Pulse 85   Temp 98.7 F (37.1  C) (Oral)   Resp (!) 21   LMP 04/01/2008 Comment: Hysterectomy was due to endometriosis. - still has ovaries.   SpO2 98%   Visual Acuity Right Eye Distance:   Left Eye Distance:   Bilateral Distance:    Right Eye Near:   Left Eye Near:    Bilateral Near:     Physical Exam Vitals and nursing note reviewed.  Constitutional:      General: She is not in acute distress.    Appearance: She is well-developed. She is not ill-appearing or toxic-appearing.  HENT:     Head: Normocephalic and atraumatic.     Right Ear: Tympanic membrane normal. No drainage. Tympanic membrane is not erythematous.     Left Ear: Tympanic membrane normal. No drainage. Tympanic membrane is not erythematous.     Nose: No congestion or rhinorrhea.     Mouth/Throat:     Mouth: Mucous membranes are moist.     Pharynx: Oropharynx is clear. Uvula midline. Posterior oropharyngeal erythema present. No oropharyngeal exudate.     Tonsils: No tonsillar exudate or tonsillar abscesses. 1+ on the right. 1+ on the left.  Eyes:     Conjunctiva/sclera: Conjunctivae normal.     Pupils: Pupils are equal, round, and reactive to light.  Cardiovascular:     Rate and Rhythm: Normal rate and regular rhythm.     Heart sounds: Normal heart sounds. No murmur heard. No friction rub. No gallop.   Pulmonary:     Effort: Pulmonary effort is normal. No  respiratory distress.     Breath sounds: Normal breath sounds. No stridor. No wheezing, rhonchi or rales.  Musculoskeletal:     Cervical back: Normal range of motion and neck supple.  Lymphadenopathy:     Cervical: Cervical adenopathy present.  Skin:    General: Skin is warm and dry.     Capillary Refill: Capillary refill takes less than 2 seconds.  Neurological:     General: No focal deficit present.     Mental Status: She is oriented to person, place, and time.      UC Treatments / Results  Labs (all labs ordered are listed, but only abnormal results are displayed) Labs  Reviewed  GROUP A STREP BY PCR  SARS CORONAVIRUS 2 (TAT 6-24 HRS)    EKG   Radiology No results found.  Procedures Procedures (including critical care time)  Medications Ordered in UC Medications - No data to display  Initial Impression / Assessment and Plan / UC Course  I have reviewed the triage vital signs and the nursing notes.  Pertinent labs & imaging results that were available during my care of the patient were reviewed by me and considered in my medical decision making (see chart for details).   Clinical impression: Right-sided ear and throat pain for 3 days.  Exam is reassuring.  Treatment plan: 1.  The findings and treatment plan were discussed in detail with the patient.  Patient was in agreement. 2.  I indicated to her that her throat actually looked fairly normal.  Both ears look good as well. 3.  I felt it reasonable to go ahead and get a strep test.  This was negative. 4.  Given the history of being in the emergency room, I recommended going ahead and getting a COVID test.  This will take between 6 and 24 hours to return from the hospital.  In the interim she needs to isolate pending the results.  If they are negative she can go back to work on Monday, January 17 with a mask.  If there are positive, someone will give her an updated work note.  She will need to isolate and quarantine per current CDC guidelines. 5.  Supportive care, over-the-counter meds as needed, Tylenol or Motrin for fever or discomfort. 6.  Follow-up here as needed.    Final Clinical Impressions(s) / UC Diagnoses   Final diagnoses:  Viral pharyngitis  Right ear pain  Viral upper respiratory tract infection     Discharge Instructions     I indicated to her that her throat actually looked fairly normal.  Both ears look good as well. I felt it reasonable to go ahead and get a strep test.  This was negative. Given the history of being in the emergency room, I recommended going ahead and  getting a COVID test.  This will take between 6 and 24 hours to return from the hospital.  In the interim she needs to isolate pending the results.  If they are negative she can go back to work on Monday, January 17 with a mask.  If there are positive, someone will give her an updated work note.  She will need to isolate and quarantine per current CDC guidelines. Supportive care, over-the-counter meds as needed, Tylenol or Motrin for fever or discomfort. Follow-up here as needed.    ED Prescriptions    None     PDMP not reviewed this encounter.   Verda Cumins, MD 03/07/20 [phone]

## 2020-03-08 LAB — SARS CORONAVIRUS 2 (TAT 6-24 HRS): SARS Coronavirus 2: NEGATIVE

## 2020-03-31 ENCOUNTER — Other Ambulatory Visit: Payer: Self-pay | Admitting: Cardiovascular Disease

## 2020-04-03 ENCOUNTER — Telehealth: Payer: Self-pay

## 2020-04-03 ENCOUNTER — Other Ambulatory Visit: Payer: Self-pay | Admitting: Internal Medicine

## 2020-04-03 DIAGNOSIS — I10 Essential (primary) hypertension: Secondary | ICD-10-CM

## 2020-04-03 DIAGNOSIS — Z90710 Acquired absence of both cervix and uterus: Secondary | ICD-10-CM | POA: Insufficient documentation

## 2020-04-03 NOTE — Telephone Encounter (Signed)
Prior Authorization for Eliquis initiated by [email or web address]. [id number]  RESPONSE: Your information has been submitted to [place] [facility]. To check for an updated outcome later, reopen this PA request from your dashboard.  If [employer] has not responded to your request within 24 hours, contact [facility] at [phone]. If you think there may be a problem with your PA request, use our live chat feature at the bottom right.

## 2020-04-12 NOTE — Telephone Encounter (Signed)
Left a detailed message for the patient to contact our office for samples of Eliquis. Told the patient if she could contact our office, we could provide samples of Eliquis until we can get the appeal process completed.

## 2020-04-12 NOTE — Telephone Encounter (Signed)
Patient calling to ask about prior auth status .    Patient is out and wants to know if she can sub xarelto left over from old rx while waiting.

## 2020-04-12 NOTE — Telephone Encounter (Signed)
Per [email or web address], Eliquis has been denied because patient did not meet the requirements of her plan. The plan covers this drug when treatment can not be switched to Xarelto. Please advise.   Appeal can be made to [facility] [place] [address] Fax [phone]  Thank you!

## 2020-04-12 NOTE — Telephone Encounter (Signed)
Prior Authorization for Eliquis has been denied. Patient lives in [place] and will not be able to get samples before the office closes. She is completely out of medication and has Xarelto on hand. She is asking if it is OK to take this instead. Please advise.

## 2020-04-15 ENCOUNTER — Telehealth: Payer: Self-pay

## 2020-04-15 NOTE — Telephone Encounter (Signed)
Encounter created in error

## 2020-04-15 NOTE — Telephone Encounter (Signed)
Spoke with Heather Dudley at [facility] RE: Eliquis Appeal. Medication has been approved from 04/15/2020 through 04/15/2021. PA [id number] Left voicemail message to inform patient is will now be covered and she can pick up her prescription from her pharmacy. Also spoke with Gerald Stabs (Pharm-D) and informed him of this as well.

## 2020-05-06 ENCOUNTER — Other Ambulatory Visit: Payer: Self-pay

## 2020-05-06 ENCOUNTER — Inpatient Hospital Stay: Payer: BC Managed Care – PPO | Attending: Hematology and Oncology

## 2020-05-06 DIAGNOSIS — Z853 Personal history of malignant neoplasm of breast: Secondary | ICD-10-CM | POA: Diagnosis not present

## 2020-05-06 LAB — COMPREHENSIVE METABOLIC PANEL
ALT: 11 U/L (ref 0–44)
AST: 18 U/L (ref 15–41)
Albumin: 3.7 g/dL (ref 3.5–5.0)
Alkaline Phosphatase: 68 U/L (ref 38–126)
Anion gap: 10 (ref 5–15)
BUN: 15 mg/dL (ref 6–20)
CO2: 29 mmol/L (ref 22–32)
Calcium: 9.2 mg/dL (ref 8.9–10.3)
Chloride: 99 mmol/L (ref 98–111)
Creatinine, Ser: 1.4 mg/dL — ABNORMAL HIGH (ref 0.44–1.00)
GFR, Estimated: 44 mL/min — ABNORMAL LOW (ref >60)
Glucose, Bld: 100 mg/dL — ABNORMAL HIGH (ref 70–99)
Potassium: 3.6 mmol/L (ref 3.5–5.1)
Sodium: 138 mmol/L (ref 135–145)
Total Bilirubin: 0.7 mg/dL (ref 0.3–1.2)
Total Protein: 7.3 g/dL (ref 6.5–8.1)

## 2020-05-06 LAB — CBC WITH DIFFERENTIAL/PLATELET
Abs Immature Granulocytes: 0.05 10*3/uL (ref 0.00–0.07)
Basophils Absolute: 0.1 10*3/uL (ref 0.0–0.1)
Basophils Relative: 1 %
Eosinophils Absolute: 0.3 10*3/uL (ref 0.0–0.5)
Eosinophils Relative: 3 %
HCT: 38.2 % (ref 36.0–46.0)
Hemoglobin: 12.3 g/dL (ref 12.0–15.0)
Immature Granulocytes: 1 %
Lymphocytes Relative: 31 %
Lymphs Abs: 3.1 10*3/uL (ref 0.7–4.0)
MCH: 28.3 pg (ref 26.0–34.0)
MCHC: 32.2 g/dL (ref 30.0–36.0)
MCV: 87.8 fL (ref 80.0–100.0)
Monocytes Absolute: 0.5 10*3/uL (ref 0.1–1.0)
Monocytes Relative: 5 %
Neutro Abs: 6 10*3/uL (ref 1.7–7.7)
Neutrophils Relative %: 59 %
Platelets: 321 10*3/uL (ref 150–400)
RBC: 4.35 MIL/uL (ref 3.87–5.11)
RDW: 13.6 % (ref 11.5–15.5)
WBC: 10 10*3/uL (ref 4.0–10.5)
nRBC: 0 % (ref 0.0–0.2)

## 2020-05-07 LAB — CANCER ANTIGEN 27.29: CA 27.29: 23.6 U/mL (ref 0.0–38.6)

## 2020-05-07 NOTE — Progress Notes (Signed)
[facility]  [address], [address] [place],  [address] Phone: [phone]  Fax: [phone]  Clinic day:  05/08/2020   Chief Complaint: Heather Dudley is a 60 y.o. female with stage IIA left breast cancer who is seen for 1 year assessment.  HPI:  The patient was last seen in the medical oncology clinic on 05/04/2019 by Beckey Rutter, NP.  At that time, she was doing well and denied breast concerns. Her blood pressure was elevated and had recently discontinued blood pressure medication due to concerns of palpitations. Exam revealed stable postoperative left breast changes. Hematocrit was 40.1, hemoglobin 12.9, platelets 310,000, WBC 9,800. CA27.29 was 20.2.  Bone density on 07/27/2019 was normal with a T score of -0.3 at the left femur neck.  The patient received the [place] COVID-19 vaccine on 06/24/2019.  Bilateral diagnostic mammogram on 07/27/2019 revealed enlarged RIGHT axillary lymph nodes, likely related to recent Covid-19 vaccine. There was a minimally complicated cyst in the 7:34 o'clock location of the RIGHT breast, warranting follow-up. There were post treatment changes in the LEFT breast which were otherwise normal. Recommended RIGHT axillary ultrasound in 3 months to assess for resolution of RIGHT axillary adenopathy. If nodes are persistently enlarged, consider ultrasound-guided core biopsy. Recommended RIGHT breast ultrasound in 6 months to assess stability of minimally complicated cyst in the 1:93 o'clock location.  Right breast ultrasound on 11/27/2019 revealed persistently thickened right axillary lymph nodes. Right axillary lymph node biopsy on 12/04/2019 was negative for malignancy.  The patient saw Dr. Rockey Situ on 03/04/2020 for paroxysmal atrial fibrillation. She continued metoprolol and Eliquis.  During the interim, she has been "ok." She reports that her vision has declined and she is having an eye exam in the upcoming months. She has  occasional joint discomfort in her fingers and knees. She had a bad episode of a fib and since then, she has noticed more shortness of breath. She denies any other symptoms or complaints.  The patient performs occasional breast self exams and has no concerns.   Past Medical History:  Diagnosis Date  . Basal cell carcinoma of forehead    birthmark of forehead  . Breast cancer ([place])   . Breast screening, unspecified   . Cellulitis and abscess of trunk   . Diastolic dysfunction    a. 05/2019 Echo: EF 55-60%, no rwma, mild LVH, Gr2 DD, Nl RV size/fxn. Triv MR.  [name] Hernia   . History of chemotherapy 2012  . Hypertension   . Lump or mass in breast   . Malignant neoplasm of breast (female), unspecified site    She underwent wide excision, mastoplasty and axillary dissection for her left breast malignancy on September 03, 2010. The primary tumor was 1 cm in diameter, and a single positive axillary node 1.3 cm in diameter. This was an ER-positive, PR slightly positive, HER-2/neu not overexpressing tumor. She tolerated her whole breast radiation without difficulty ending in late February 2013.  . Malignant neoplasm of upper-outer quadrant of female breast ([place])   . 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 Obesity, unspecified   . PAF (paroxysmal atrial fibrillation) ([place]) 05/04/2017   a. 04/2019 Zio: RSR, avg HR 79. 2% PAF burden (73-175, avg 115; longest 23mins 28secs). CHA2DS2VASc = 2-->Xarelto.  . Personal history of chemotherapy   . Personal history of malignant neoplasm of breast    The patient underwent wide excision, mastoplasty. Dissection for a T1b, N1, M0 carcinoma left breast  on September 03, 2010.The primary tumor was 1 cm in diameter, and a single positive axillary node 1.3 cm in diameter. This was an ER-positive, PR slightly positive, HER-2/neu not overexpressing tumor.  The patient completed whole breast radiation in February 2013.  [name] Personal history of radiation  therapy 2013   LEFT lumpectomy  . Screening for obesity   . Thyroid nodule     Past Surgical History:  Procedure Laterality Date  . ABDOMINAL HYSTERECTOMY  2011  . birth mark removed    . BREAST CYST ASPIRATION Right    negative 01/2010  . BREAST EXCISIONAL BIOPSY  August 05, 2010   left breast positive 07/2010  . BREAST LUMPECTOMY  2012   left breast  . COLONOSCOPY  July 29, 2012   normal study.  . robotic tonsillectomy Lingual tonsils Bilateral 06/08/2016  . THYROID SURGERY     partially removed  . TUBAL LIGATION      Family History  Problem Relation Age of Onset  . Colon cancer Other   . Ovarian cancer Other   . Cancer Mother        lung ca  . Cancer Maternal Uncle        lung ca  . Cancer Paternal Aunt        ovarian ca  . Cancer Cousin        female ca  . Heart failure Father   . Breast cancer Neg Hx     Social History:  reports that she has never smoked. She has never used smokeless tobacco. She reports that she does not drink alcohol and does not use drugs.  She has 3 sons (ages 70, 19, and 39).  One son lives at home and is a Research scientist (medical).  She is from [place].  She lives in [place].  The patient is alone today.  Allergies:  Allergies  Allergen Reactions  . Ace Inhibitors Swelling  . Sernivo [Betamethasone Dipropionate] Itching  . Penicillins Rash    Has patient had a PCN reaction causing immediate rash, facial/tongue/throat swelling, SOB or lightheadedness with hypotension: Yes Has patient had a PCN reaction causing severe rash involving mucus membranes or skin necrosis: No Has patient had a PCN reaction that required hospitalization: No Has patient had a PCN reaction occurring within the last 10 years: No If all of the above answers are "NO", then may proceed with Cephalosporin use.    Current Medications: Current Outpatient Medications  Medication Sig Dispense Refill  . amLODipine (NORVASC) 5 MG tablet TAKE 1 TABLET BY MOUTH EVERY DAY 90 tablet 1   . apixaban (ELIQUIS) 5 MG TABS tablet Take 1 tablet (5 mg total) by mouth 2 (two) times daily. 60 tablet 3  . B Complex-Biotin-FA (B-COMPLEX PO) Take 1 tablet by mouth daily.    . CHOLECALCIFEROL PO Take 4,000 Units by mouth daily.    [name] levothyroxine (SYNTHROID) 50 MCG tablet Take 50 mcg by mouth daily.    [name] LORazepam (ATIVAN) 0.5 MG tablet Take 1 tablet (0.5 mg total) by mouth every 8 (eight) hours as needed for anxiety. 20 tablet 0  . metoprolol succinate (TOPROL-XL) 100 MG 24 hr tablet Take 1 tablet (100 mg total) by mouth daily. Take with or immediately following a meal. 90 tablet 3  . Multiple Vitamin (MULTIVITAMIN) tablet Take 1 tablet by mouth daily.    . potassium chloride SA (KLOR-CON) 20 MEQ tablet Take 1 tablet (20 mEq total) by mouth daily. 90 tablet 3  .  propranolol (INDERAL) 20 MG tablet TAKE 1 TABLET BY MOUTH 3 TIMES DAILY AS NEEDED. 90 tablet 0  . hydrochlorothiazide (MICROZIDE) 12.5 MG capsule Take 1 capsule (12.5 mg total) by mouth daily. 90 capsule 3   No current facility-administered medications for this visit.    Review of Systems  Constitutional: Negative for chills, diaphoresis, fever, malaise/fatigue and weight loss (up 10 lbs).       Feels "okay."  HENT: Negative for congestion, ear discharge, ear pain, hearing loss, nosebleeds, sinus pain, sore throat and tinnitus.   Eyes: Positive for blurred vision.  Respiratory: Positive for shortness of breath. Negative for cough, hemoptysis and sputum production.   Cardiovascular: Negative for chest pain, palpitations and leg swelling.  Gastrointestinal: Negative for abdominal pain, blood in stool, constipation, diarrhea, heartburn, melena, nausea and vomiting.  Genitourinary: Negative for dysuria, frequency, hematuria and urgency.  Musculoskeletal: Positive for joint pain (fingers, knees). Negative for back pain, myalgias and neck pain.  Skin: Negative for itching and rash.  Neurological: Negative for dizziness, tingling,  sensory change, weakness and headaches.  Endo/Heme/Allergies: Does not bruise/bleed easily.  Psychiatric/Behavioral: Negative for depression and memory loss. The patient is not nervous/anxious and does not have insomnia.   All other systems reviewed and are negative.  Performance Status (ECOG): 1  Vital Signs Blood pressure (!) 141/81, pulse 71, temperature (!) 96.9 F (36.1 C), temperature source Tympanic, resp. rate 18, weight 255 lb 11.7 oz (116 kg), last menstrual period 04/01/2008, SpO2 100 %.   Physical Exam Vitals and nursing note reviewed.  Constitutional:      General: She is not in acute distress.    Appearance: She is not diaphoretic.  HENT:     Head: Normocephalic and atraumatic.     Mouth/Throat:     Mouth: Mucous membranes are moist.     Pharynx: Oropharynx is clear.  Eyes:     General: No scleral icterus.    Extraocular Movements: Extraocular movements intact.     Conjunctiva/sclera: Conjunctivae normal.     Pupils: Pupils are equal, round, and reactive to light.  Cardiovascular:     Rate and Rhythm: Normal rate and regular rhythm.     Heart sounds: Normal heart sounds. No murmur heard.   Pulmonary:     Effort: Pulmonary effort is normal. No respiratory distress.     Breath sounds: Normal breath sounds. No wheezing or rales.  Chest:     Chest wall: No tenderness.  Breasts:     Right: Skin change (fibrocystic changes) present. No swelling, bleeding, tenderness, axillary adenopathy or supraclavicular adenopathy.     Left: Swelling (edema inferiorly s/p radiation), skin change (fibrocystic changes at 10-12 o clock. Scar tissue along scar. Halfway into scar, there is increased scar tissue, scar is wider in that area.) and tenderness present. No bleeding, mass, axillary adenopathy or supraclavicular adenopathy.    Abdominal:     General: Bowel sounds are normal. There is no distension.     Palpations: Abdomen is soft. There is no mass.     Tenderness: There is no  abdominal tenderness. There is no guarding or rebound.  Musculoskeletal:        General: No swelling or tenderness. Normal range of motion.     Cervical back: Normal range of motion and neck supple.  Lymphadenopathy:     Head:     Right side of head: No preauricular, posterior auricular or occipital adenopathy.     Left side of head: No preauricular, posterior  auricular or occipital adenopathy.     Cervical: No cervical adenopathy.     Upper Body:     Right upper body: No supraclavicular or axillary adenopathy.     Left upper body: No supraclavicular or axillary adenopathy.     Lower Body: No right inguinal adenopathy. No left inguinal adenopathy.  Skin:    General: Skin is warm and dry.  Neurological:     Mental Status: She is alert and oriented to person, place, and time.  Psychiatric:        Behavior: Behavior normal.        Thought Content: Thought content normal.        Judgment: Judgment normal.    Appointment on 05/06/2020  Component Date Value Ref Range Status  . CA 27.29 05/06/2020 23.6  0.0 - 38.6 U/mL Final   Comment: (NOTE) Siemens Centaur Immunochemiluminometric Methodology [facility]) Values obtained with different assay methods or kits cannot be used interchangeably. Results cannot be interpreted as absolute evidence of the presence or absence of malignant disease. Performed At: [facility] [place], [place] [id number] Rush Farmer MD MG:[id number]   . WBC 05/06/2020 10.0  4.0 - 10.5 K/uL Final  . RBC 05/06/2020 4.35  3.87 - 5.11 MIL/uL Final  . Hemoglobin 05/06/2020 12.3  12.0 - 15.0 g/dL Final  . HCT 05/06/2020 38.2  36.0 - 46.0 % Final  . MCV 05/06/2020 87.8  80.0 - 100.0 fL Final  . MCH 05/06/2020 28.3  26.0 - 34.0 pg Final  . MCHC 05/06/2020 32.2  30.0 - 36.0 g/dL Final  . RDW 05/06/2020 13.6  11.5 - 15.5 % Final  . Platelets 05/06/2020 321  150 - 400 K/uL Final  . nRBC 05/06/2020 0.0  0.0 - 0.2 % Final  . Neutrophils Relative %  05/06/2020 59  % Final  . Neutro Abs 05/06/2020 6.0  1.7 - 7.7 K/uL Final  . Lymphocytes Relative 05/06/2020 31  % Final  . Lymphs Abs 05/06/2020 3.1  0.7 - 4.0 K/uL Final  . Monocytes Relative 05/06/2020 5  % Final  . Monocytes Absolute 05/06/2020 0.5  0.1 - 1.0 K/uL Final  . Eosinophils Relative 05/06/2020 3  % Final  . Eosinophils Absolute 05/06/2020 0.3  0.0 - 0.5 K/uL Final  . Basophils Relative 05/06/2020 1  % Final  . Basophils Absolute 05/06/2020 0.1  0.0 - 0.1 K/uL Final  . Immature Granulocytes 05/06/2020 1  % Final  . Abs Immature Granulocytes 05/06/2020 0.05  0.00 - 0.07 K/uL Final   Performed at [facility], [address], [place], [place] [address]  . Sodium 05/06/2020 138  135 - 145 mmol/L Final  . Potassium 05/06/2020 3.6  3.5 - 5.1 mmol/L Final  . Chloride 05/06/2020 99  98 - 111 mmol/L Final  . CO2 05/06/2020 29  22 - 32 mmol/L Final  . Glucose, Bld 05/06/2020 100* 70 - 99 mg/dL Final   Glucose reference range applies only to samples taken after fasting for at least 8 hours.  . BUN 05/06/2020 15  6 - 20 mg/dL Final  . Creatinine, Ser 05/06/2020 1.40* 0.44 - 1.00 mg/dL Final  . Calcium 05/06/2020 9.2  8.9 - 10.3 mg/dL Final  . Total Protein 05/06/2020 7.3  6.5 - 8.1 g/dL Final  . Albumin 05/06/2020 3.7  3.5 - 5.0 g/dL Final  . AST 05/06/2020 18  15 - 41 U/L Final  . ALT 05/06/2020 11  0 - 44 U/L Final  .  Alkaline Phosphatase 05/06/2020 68  38 - 126 U/L Final  . Total Bilirubin 05/06/2020 0.7  0.3 - 1.2 mg/dL Final  . GFR, Estimated 05/06/2020 44* >60 mL/min Final   Comment: (NOTE) Calculated using the CKD-EPI Creatinine Equation (2021)   . Anion gap 05/06/2020 10  5 - 15 Final   Performed at [facility], [address] [place], [place] [address]    Assessment:  Heather Dudley is a 60 y.o. female with stage IIA left breast cancer s/p wide excision and axillary lymph node dissection on 09/03/2010.  Pathology revealed a 1.0 cm  grade II invasive ductal carcinoma. One of 14 lymph nodes were positive macrometastasis of 1.3 cm.  Pathologic stage was T1cN1bM0.  Tumor was ER positive (90%), PR positive (20%), and HER-2/neu negative by FISH (Her-2/CEP17 ratio < 1.8).  She enrolled on NSABP B-47. She received AC 4 followed by weekly paclitaxel (completed on 02/06/2011).  She received breast radiation in 03/2011.  She completed a year of adjuvant Herceptin on 11/13/2011. She was on Arimidex from 07/2011 - 07/2015 and Femara from 09/04/2015 - 10/09/2015.  She stopped Arimidex and Femara secondary to vasomotor symptoms.  She began Aromasin on 10/09/2015 (discontinued in 11/2015 or 12/2015).  Breast cancer index (BCI) testing on 10/08/2015 revealed a high risk of late recurrence  Risk is estimated at 12.7% (CI: 7.4%-17.7%) during years 5-10.   She has a high likelihood of benefit from extended adjuvant hormonal therapy.  Bilateral mammogram on 12/10/2014 revealed lumpectomy changes in the left breast without evidence of malignancy.  Bilateral mammogram on 07/17/2016 revealed no evidence of malignancy.  Left sided mammogram and ultrasound on 04/22/2017 revealed postsurgical scar with dense postsurgical calcifications unchanged compared to prior exam. There was no suspicious abnormality.  Targeted ultrasound showed postsurgical scar calcification in the palpable area lateral inferior portion of the left breast.  There was no suspicious mass is identified in the palpable area.  Bilateral diagnostic mammogram on 07/20/2017 revealed no mammographic evidence of breast malignancy.  Bilateral diagnostic mammogram on 07/27/2019 revealed enlarged RIGHT axillary lymph nodes, likely related to recent Covid-19 vaccine. There was a minimally complicated cyst in the 2:09 o'clock location of the RIGHT breast, warranting follow-up. There were post treatment changes in the LEFT breast which were otherwise normal. Recommended RIGHT axillary ultrasound in 3  months to assess for resolution of RIGHT axillary adenopathy. If nodes are persistently enlarged, consider ultrasound-guided core biopsy. Recommended RIGHT breast ultrasound in 6 months to assess stability of minimally complicated cyst in the 4:70 o'clock location.  Right breast ultrasound on 11/27/2019 revealed persistently thickened right axillary lymph nodes.   Right axillary lymph node biopsy on 12/04/2019 was negative for malignancy.  CA27.29 has been followed: 17.4 on 11/02/2013, 21 on 12/20/2014, 19.5 on 09/04/2015, 21.7 on 10/09/2015, 17.6 on 02/05/2016, 18.6 on 04/14/2017, 17.6 on 04/27/2018, 20.2 on 05/01/2019, and 23.6 on 05/06/2020.  Chest CT angiogram on 03/21/2017 revealed no evidence of pulmonary embolism.  There was a 2.3 cm  indeterminate lesion in the right lobe of the liver.  Abdomen and pelvic CT on 04/05/2017 revealed a 2.4 cm hypodensity over the right lobe of the liver unchanged in size from 2013 and compatible with a hemangioma.  There were a ffew small liver cysts were described.    Bone density study on 12/04/2015 was normal with a T-score of -0.9 in the AP spine L1-L4 and -0.6 in the left femoral neck.  Bone density on 07/27/2019 was normal with  a T score of -0.3 at the left femur neck.  She has vasomotor symptoms well managed on Effexor.  She underwent bilateral tonsillect[facility] on [facility]/08/2016 at [facility] for obstructive sleep apnea.  She has microscopic hematuria.  Urinalysis on 07/01/2017 and 04/08/2018 revealed large RBCs.  She denies any gross hematuria.  She has no history of nephrolithiasis.  She received the [place] COVID-19 vaccine on 06/24/2019.  Symptomatically, she has been "ok." She has occasional joint discomfort in her fingers and knees. She performs occasional breast self exams and has no concerns.  Exam is stable.  Plan: 1.   Review labs from 05/06/2020. 2.   Stage IIA left breast cancer  Clinically, she continues to do well  Exam remains stable with notable  scarring.  CA27.29 is 23.6 (normal).  Bilateral diagnostic mammogram on 07/27/2019 revealed enlarged RIGHT axillary lymph nodes, likely related to recent Covid-19 vaccine.   Right breast ultrasound on 11/27/2019 revealed persistently thickened right axillary lymph nodes.    Right axillary lymph node biopsy on 12/04/2019 was negative for malignancy.  Discuss plan for ongoing close surveillance.  Bilateral mammogram on 07/26/2020. 3.   History of microscopic hematuria  UA from 03/2018 revealed large RBCs and 06/2019 revealed 2+ blood.  Consider repeat urinalysis and evaluation if hematuria persists. 4.   Bilateral mammogram 07/26/2020. 5.   RN:  Phone follow-up with patient regarding need for 6 month right breast ultrasound to assess cyst. 6.   RN:  patient requests potassium level sent to her cardiologist, Dr Rockey Situ. 7.   RTC in 1 year for MD assessment, labs (CBC with diff, CMP, CA27.29) and review of interval imaging.  I discussed the assessment and treatment plan with the patient.  The patient was provided an opportunity to ask questions and all were answered.  The patient agreed with the plan and demonstrated an understanding of the instructions.  The patient was advised to call back if the symptoms worsen or if the condition fails to improve as anticipated.  I provided 22 minutes of face-to-face time during this this encounter and > 50% was spent counseling as documented under my assessment and plan.  An additional 10 minutes were spent reviewing her chart (Epic and Care Everywhere) including notes, labs, and imaging studies.    Lequita Asal, MD, PhD 05/08/2020, 12:15 PM  I, De Burrs, am acting as Education administrator for [employer]. Mike Gip, MD, PhD.  I, Melissa C. Mike Gip, MD, have reviewed the above documentation for accuracy and completeness, and I agree with the above.

## 2020-05-08 ENCOUNTER — Inpatient Hospital Stay (HOSPITAL_BASED_OUTPATIENT_CLINIC_OR_DEPARTMENT_OTHER): Payer: BC Managed Care – PPO | Admitting: Hematology and Oncology

## 2020-05-08 ENCOUNTER — Telehealth: Payer: Self-pay | Admitting: Hematology and Oncology

## 2020-05-08 ENCOUNTER — Other Ambulatory Visit: Payer: Self-pay

## 2020-05-08 ENCOUNTER — Encounter: Payer: Self-pay | Admitting: Hematology and Oncology

## 2020-05-08 VITALS — BP 141/81 | HR 71 | Temp 96.9°F | Resp 18 | Wt 255.7 lb

## 2020-05-08 DIAGNOSIS — N6001 Solitary cyst of right breast: Secondary | ICD-10-CM | POA: Insufficient documentation

## 2020-05-08 DIAGNOSIS — R3129 Other microscopic hematuria: Secondary | ICD-10-CM

## 2020-05-08 DIAGNOSIS — Z853 Personal history of malignant neoplasm of breast: Secondary | ICD-10-CM | POA: Diagnosis not present

## 2020-05-08 HISTORY — DX: Solitary cyst of right breast: N60.01

## 2020-05-08 NOTE — Telephone Encounter (Signed)
05/08/2020 Left VM for pt informing her Dr. Loletha Grayer wanted a breast US done in the next 1-2 weeks, but per [facility], her mammogram could not be scheduled until the results from the [place] are in. I gave her the date, 3/21, and asked that she call our office or [facility] to schedule the mammo after we get the results  SRW

## 2020-05-13 ENCOUNTER — Other Ambulatory Visit: Payer: BC Managed Care – PPO

## 2020-06-17 ENCOUNTER — Other Ambulatory Visit: Payer: Self-pay

## 2020-06-17 ENCOUNTER — Ambulatory Visit
Admission: RE | Admit: 2020-06-17 | Discharge: 2020-06-17 | Disposition: A | Discharge status: Home / Self Care | Payer: BC Managed Care – PPO | Source: Ambulatory Visit | Attending: Hematology and Oncology | Admitting: Hematology and Oncology

## 2020-06-17 DIAGNOSIS — N6001 Solitary cyst of right breast: Secondary | ICD-10-CM | POA: Diagnosis not present

## 2020-06-20 ENCOUNTER — Telehealth: Payer: Self-pay

## 2020-06-20 MED ORDER — APIXABAN 5 MG PO TABS: 5.0000 mg | ORAL_TABLET | Freq: Two times a day (BID) | ORAL | 5 refills | Status: DC

## 2020-06-20 NOTE — Telephone Encounter (Signed)
*  STAT* If patient is at the pharmacy, call can be transferred to refill team.   1. Which medications need to be refilled? (please list name of each medication and dose if known) Eliquis  2. Which pharmacy/location (including street and city if local pharmacy) is medication to be sent to? [facility] [place], [place]  3. Do they need a 30 day or 90 day supply? [place]

## 2020-06-20 NOTE — Telephone Encounter (Signed)
Prescription refill request for Eliquis received. Indication:afib Last office visit: 03/04/20 Scr:1.4 Age: 60 Weight:116kg

## 2020-07-03 ENCOUNTER — Other Ambulatory Visit: Payer: Self-pay | Admitting: Internal Medicine

## 2020-07-03 ENCOUNTER — Telehealth: Payer: Self-pay

## 2020-07-03 DIAGNOSIS — N6310 Unspecified lump in the right breast, unspecified quadrant: Secondary | ICD-10-CM

## 2020-07-03 NOTE — Telephone Encounter (Signed)
Copied from [place] [id number]. Topic: General - Call Back - No Documentation >> Jul 03, 2020  3:27 PM Erick Blinks wrote: Reason for CRM: Call back request, patient wants to know if she must wait until her CPE on 07/12/2020 to get order for her mammogram. Please advise   Best contact: [phone]

## 2020-07-03 NOTE — Telephone Encounter (Signed)
Patient informed. 

## 2020-07-03 NOTE — Telephone Encounter (Signed)
The mammogram was ordered this AM - to follow up on the abnormality noted.

## 2020-07-07 ENCOUNTER — Other Ambulatory Visit: Payer: Self-pay | Admitting: Cardiovascular Disease

## 2020-07-11 DIAGNOSIS — D6869 Other thrombophilia: Secondary | ICD-10-CM | POA: Insufficient documentation

## 2020-07-11 NOTE — Progress Notes (Signed)
Date:  07/12/2020   Name:  Heather Dudley   DOB:  06-13-1960   MRN:  [id number]   Chief Complaint: Annual Exam (No breast or pap.)  Heather Dudley is a 60 y.o. female who presents today for her Complete Annual Exam. She feels well. She reports exercising walk of treadmill for 20 mins X 3 days a week. She reports she is sleeping fairly well. Breast complaints after covid shot right breast had got swollen. Has been getting test done.  Mammogram: 07/2019; scheduled 07/29/20 DEXA: none Pap smear: discontinued Colonoscopy: 07/29/2012 repeat 10 yrs  Immunization History  Administered Date(s) Administered  . PFIZER(Purple Top)SARS-COV-2 Vaccination 06/24/2019    Hypertension This is a chronic problem. The problem is controlled. Associated symptoms include anxiety. Pertinent negatives include no chest pain, headaches, palpitations or shortness of breath. Past treatments include beta blockers, diuretics and calcium channel blockers. The current treatment provides significant improvement. There are no compliance problems.  There is no history of kidney disease, CAD/MI or CVA.  Anxiety Presents for follow-up visit. Symptoms include panic. Patient reports no chest pain, dizziness, nervous/anxious behavior, palpitations or shortness of breath. Symptoms occur occasionally. The severity of symptoms is moderate.   Compliance with medications: Ativan PRN.  CPAP - now on CPAP nightly. She is sleeping well, wakes refreshed.   Wheezing - pt has noted wheezing in her chest for the past weeks.  Minimal cough, no SOB, she is not exercising but does not some increase in work of breathing with exertion. She has allergies and has been taking benadryl at night.  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 No hx of smoking or asthma.  Lab Results  Component Value Date   CREATININE 1.40 (H) 05/06/2020   BUN 15 05/06/2020   NA 138 05/06/2020   K 3.6 05/06/2020   CL 99 05/06/2020   CO2 29 05/06/2020   Lab  Results  Component Value Date   CHOL 282 (H) 07/11/2019   HDL 51 07/11/2019   LDLCALC 211 (H) 07/11/2019   TRIG 111 07/11/2019   CHOLHDL 5.5 (H) 07/11/2019   Lab Results  Component Value Date   TSH 2.060 05/23/2019   Lab Results  Component Value Date   HGBA1C 5.8 (H) 07/11/2019   Lab Results  Component Value Date   WBC 10.0 05/06/2020   HGB 12.3 05/06/2020   HCT 38.2 05/06/2020   MCV 87.8 05/06/2020   PLT 321 05/06/2020   Lab Results  Component Value Date   ALT 11 05/06/2020   AST 18 05/06/2020   ALKPHOS 68 05/06/2020   BILITOT 0.7 05/06/2020     Review of Systems  Constitutional: Negative for chills, fatigue and fever.  HENT: Negative for congestion, hearing loss, tinnitus, trouble swallowing and voice change.   Eyes: Negative for visual disturbance.  Respiratory: Positive for wheezing. Negative for cough, chest tightness and shortness of breath.   Cardiovascular: Negative for chest pain, palpitations and leg swelling.  Gastrointestinal: Negative for abdominal pain, constipation, diarrhea and vomiting.  Endocrine: Negative for polydipsia and polyuria.  Genitourinary: Negative for dysuria, frequency, genital sores, vaginal bleeding and vaginal discharge.  Musculoskeletal: Negative for arthralgias, gait problem and joint swelling.  Skin: Negative for color change and rash.  Neurological: Negative for dizziness, tremors, light-headedness and headaches.  Hematological: Negative for adenopathy. Does not bruise/bleed easily.  Psychiatric/Behavioral: Negative for dysphoric mood and sleep disturbance. The patient is not nervous/anxious.     Patient Active Problem List  Diagnosis Date Noted  . OSA on CPAP 07/12/2020  . Acquired thrombophilia ([place]) 07/11/2020  . Breast cyst, right 05/08/2020  . Status post hysterectomy 04/03/2020  . Colon cancer screening 07/13/2019  . Paroxysmal atrial fibrillation ([place]) 07/11/2019  . Multiple thyroid nodules 08/23/2018  . Mixed  hyperlipidemia 07/06/2018  . Microscopic hematuria 05/04/2018  . Liver hemangioma 03/26/2017  . Panic disorder 06/15/2016  . Macroglossia 02/05/2016  . Environmental and seasonal allergies 01/02/2016  . Hot flashes related to aromatase inhibitor therapy 09/08/2015  . Vitamin D deficiency 04/19/2015  . Cervical radiculopathy 04/17/2015  . Essential hypertension 04/17/2015  . History of partial thyroidectomy 04/17/2015  . Obesity, Class II, BMI 35-39.9 04/17/2015  . Lipoma of axilla 10/24/2014  . History of left breast cancer 09/02/2010    Allergies  Allergen Reactions  . Ace Inhibitors Swelling  . Sernivo [Betamethasone Dipropionate] Itching  . Penicillins Rash    Has patient had a PCN reaction causing immediate rash, facial/tongue/throat swelling, SOB or lightheadedness with hypotension: Yes Has patient had a PCN reaction causing severe rash involving mucus membranes or skin necrosis: No Has patient had a PCN reaction that required hospitalization: No Has patient had a PCN reaction occurring within the last 10 years: No If all of the above answers are "NO", then may proceed with Cephalosporin use.    Past Surgical History:  Procedure Laterality Date  . ABDOMINAL HYSTERECTOMY  2011  . birth mark removed    . BREAST CYST ASPIRATION Right    negative 01/2010  . BREAST EXCISIONAL BIOPSY  August 05, 2010   left breast positive 07/2010  . BREAST LUMPECTOMY  2012   left breast  . COLONOSCOPY  July 29, 2012   normal study.  . robotic tonsillectomy Lingual tonsils Bilateral 06/08/2016  . THYROID SURGERY     partially removed  . TUBAL LIGATION      Social History   Tobacco Use  . Smoking status: Never Smoker  . Smokeless tobacco: Never Used  Vaping Use  . Vaping Use: Never used  Substance Use Topics  . Alcohol use: No    Alcohol/week: 0.0 standard drinks  . Drug use: No     Medication list has been reviewed and updated.  Current Meds  Medication Sig  . amLODipine  (NORVASC) 5 MG tablet TAKE 1 TABLET BY MOUTH EVERY DAY  . apixaban (ELIQUIS) 5 MG TABS tablet Take 1 tablet (5 mg total) by mouth 2 (two) times daily.  . B Complex-Biotin-FA (B-COMPLEX PO) Take 1 tablet by mouth daily.  . hydrochlorothiazide (MICROZIDE) 12.5 MG capsule TAKE 1 CAPSULE BY MOUTH EVERY DAY  . levothyroxine (SYNTHROID) 50 MCG tablet Take 50 mcg by mouth daily.  . metoprolol succinate (TOPROL-XL) 100 MG 24 hr tablet Take 1 tablet (100 mg total) by mouth daily. Take with or immediately following a meal.  . Multiple Vitamin (MULTIVITAMIN) tablet Take 1 tablet by mouth daily.  . propranolol (INDERAL) 20 MG tablet TAKE 1 TABLET BY MOUTH 3 TIMES DAILY AS NEEDED.  . [DISCONTINUED] LORazepam (ATIVAN) 0.5 MG tablet Take 1 tablet (0.5 mg total) by mouth every 8 (eight) hours as needed for anxiety.    PHQ 2/9 Scores 07/12/2020 07/11/2019 05/19/2019 08/10/2018  PHQ - 2 Score 0 1 0 2  PHQ- 9 Score 2 1 0 7    GAD 7 : Generalized Anxiety Score 07/12/2020 07/11/2019 03/26/2017  Nervous, Anxious, on Edge 1 0 3  Control/stop worrying 1 0 2  Worry too much -  different things 1 0 2  Trouble relaxing 0 0 2  Restless 0 0 2  Easily annoyed or irritable 0 0 3  Afraid - awful might happen 0 0 2  Total GAD 7 Score 3 0 16  Anxiety Difficulty - - Very difficult    BP Readings from Last 3 Encounters:  07/12/20 138/74  05/08/20 (!) 141/81  03/07/20 (!) 152/86    Physical Exam Vitals and nursing note reviewed. Chaperone present: exam deferred.  Constitutional:      General: She is not in acute distress.    Appearance: She is well-developed.  HENT:     Head: Normocephalic and atraumatic.     Right Ear: Tympanic membrane and ear canal normal.     Left Ear: Tympanic membrane and ear canal normal.     Nose:     Right Sinus: No maxillary sinus tenderness.     Left Sinus: No maxillary sinus tenderness.  Eyes:     General: No scleral icterus.       Right eye: No discharge.        Left eye: No  discharge.     Conjunctiva/sclera: Conjunctivae normal.  Neck:     Thyroid: No thyromegaly.     Vascular: No carotid bruit.  Cardiovascular:     Rate and Rhythm: Normal rate and regular rhythm.     Pulses: Normal pulses.     Heart sounds: Normal heart sounds.  Pulmonary:     Effort: Pulmonary effort is normal. No respiratory distress.     Breath sounds: No decreased air movement or transmitted upper airway sounds. Examination of the right-upper field reveals wheezing. Examination of the left-upper field reveals wheezing. Wheezing present.  Abdominal:     General: Bowel sounds are normal.     Palpations: Abdomen is soft.     Tenderness: There is no abdominal tenderness.  Musculoskeletal:     Cervical back: Normal range of motion. No erythema.     Right lower leg: No edema.     Left lower leg: No edema.  Lymphadenopathy:     Cervical: No cervical adenopathy.  Skin:    General: Skin is warm and dry.     Capillary Refill: Capillary refill takes less than 2 seconds.     Findings: No rash.  Neurological:     General: No focal deficit present.     Mental Status: She is alert and oriented to person, place, and time.     Cranial Nerves: No cranial nerve deficit.     Sensory: No sensory deficit.     Deep Tendon Reflexes: Reflexes are normal and symmetric.  Psychiatric:        Attention and Perception: Attention normal.        Mood and Affect: Mood normal.     Wt Readings from Last 3 Encounters:  07/12/20 254 lb (115.2 kg)  05/08/20 255 lb 11.7 oz (116 kg)  03/04/20 253 lb (114.8 kg)    BP 138/74   Pulse 64   Temp 98 F (36.7 C) (Oral)   Resp 16   Ht 5\' 7"  (1.702 m)   Wt 254 lb (115.2 kg)   LMP 04/01/2008 Comment: Hysterectomy was due to endometriosis. - still has ovaries.   SpO2 98%   BMI 39.78 kg/m   Assessment and Plan: 1. Annual physical exam Exam is normal except for weight. Encourage regular exercise and appropriate dietary changes. - Hemoglobin A1c  2.  Essential hypertension Clinically stable exam with  well controlled BP. Tolerating medications without side effects at this time. Pt to continue current regimen and low sodium diet; benefits of regular exercise as able discussed. - CBC with Differential/Platelet - Comprehensive metabolic panel - POCT urinalysis dipstick  3. Multiple thyroid nodules Followed by ENT Dr. Kathyrn Sheriff  4. Mixed hyperlipidemia Recommend reduced fat diet, exercise Will advise if medication is needed. - Lipid panel  5. Paroxysmal atrial fibrillation (HCC) On Eliquis In SR today with controlled rate  6. Acquired thrombophilia ([place]) No bleeding issues noted Has had some microscopic hematuria but WNL today  7. History of left breast cancer Followed by Oncology Mammogram upcoming  8. Abnormal lung sounds Uncertain cause of chest symptoms Not alleviated by allergy medication like Benadryl Recommend trial of Claritin daily and will get CXR - DG Chest 2 View; Future  9. Anxiety attack Continue ativan PRN only - LORazepam (ATIVAN) 0.5 MG tablet; Take 1 tablet (0.5 mg total) by mouth every 8 (eight) hours as needed for anxiety.  Dispense: 20 tablet; Refill: 0  10. OSA on CPAP Doing well with no compliance issues per patient report.   Partially dictated using Editor, commissioning. Any errors are unintentional.  Halina Maidens, MD [facility]  07/12/2020

## 2020-07-12 ENCOUNTER — Ambulatory Visit
Admission: RE | Admit: 2020-07-12 | Discharge: 2020-07-12 | Disposition: A | Discharge status: Home / Self Care | Payer: BC Managed Care – PPO | Attending: Internal Medicine | Admitting: Internal Medicine

## 2020-07-12 ENCOUNTER — Ambulatory Visit (INDEPENDENT_AMBULATORY_CARE_PROVIDER_SITE_OTHER): Payer: BC Managed Care – PPO | Admitting: Internal Medicine

## 2020-07-12 ENCOUNTER — Encounter: Payer: Self-pay | Admitting: Internal Medicine

## 2020-07-12 ENCOUNTER — Other Ambulatory Visit: Payer: Self-pay

## 2020-07-12 ENCOUNTER — Ambulatory Visit
Admission: RE | Admit: 2020-07-12 | Discharge: 2020-07-12 | Disposition: A | Discharge status: Home / Self Care | Payer: BC Managed Care – PPO | Source: Ambulatory Visit | Attending: Internal Medicine | Admitting: Internal Medicine

## 2020-07-12 VITALS — BP 138/74 | HR 64 | Temp 98.0°F | Resp 16 | Ht 67.0 in | Wt 254.0 lb

## 2020-07-12 DIAGNOSIS — Z9989 Dependence on other enabling machines and devices: Secondary | ICD-10-CM

## 2020-07-12 DIAGNOSIS — R0989 Other specified symptoms and signs involving the circulatory and respiratory systems: Secondary | ICD-10-CM

## 2020-07-12 DIAGNOSIS — G4733 Obstructive sleep apnea (adult) (pediatric): Secondary | ICD-10-CM

## 2020-07-12 DIAGNOSIS — Z853 Personal history of malignant neoplasm of breast: Secondary | ICD-10-CM

## 2020-07-12 DIAGNOSIS — I10 Essential (primary) hypertension: Secondary | ICD-10-CM | POA: Diagnosis not present

## 2020-07-12 DIAGNOSIS — E042 Nontoxic multinodular goiter: Secondary | ICD-10-CM | POA: Diagnosis not present

## 2020-07-12 DIAGNOSIS — D6869 Other thrombophilia: Secondary | ICD-10-CM

## 2020-07-12 DIAGNOSIS — C50912 Malignant neoplasm of unspecified site of left female breast: Secondary | ICD-10-CM | POA: Insufficient documentation

## 2020-07-12 DIAGNOSIS — I480 Paroxysmal atrial fibrillation: Secondary | ICD-10-CM | POA: Diagnosis not present

## 2020-07-12 DIAGNOSIS — Z170 Estrogen receptor positive status [ER+]: Secondary | ICD-10-CM | POA: Insufficient documentation

## 2020-07-12 DIAGNOSIS — Z0000 Encounter for general adult medical examination without abnormal findings: Secondary | ICD-10-CM

## 2020-07-12 DIAGNOSIS — E782 Mixed hyperlipidemia: Secondary | ICD-10-CM

## 2020-07-12 DIAGNOSIS — F410 Panic disorder [episodic paroxysmal anxiety] without agoraphobia: Secondary | ICD-10-CM

## 2020-07-12 LAB — POCT URINALYSIS DIPSTICK
Bilirubin, UA: NEGATIVE
Glucose, UA: NEGATIVE
Ketones, UA: NEGATIVE
Leukocytes, UA: NEGATIVE
Nitrite, UA: NEGATIVE
Protein, UA: NEGATIVE
Spec Grav, UA: 1.01 (ref 1.010–1.025)
Urobilinogen, UA: 0.2 E.U./dL
pH, UA: 7 (ref 5.0–8.0)

## 2020-07-12 MED ORDER — LORAZEPAM 0.5 MG PO TABS
0.5000 mg | ORAL_TABLET | Freq: Three times a day (TID) | ORAL | 0 refills | Status: DC | PRN
Start: 2020-07-12 — End: 2021-01-31

## 2020-07-12 NOTE — Patient Instructions (Signed)
Start Claritin or Allegra daily for 1-2 weeks  Consider shingles vaccine (Shingrix)  Colonoscopy due in 3 years

## 2020-07-13 LAB — COMPREHENSIVE METABOLIC PANEL
ALT: 10 IU/L (ref 0–32)
AST: 17 IU/L (ref 0–40)
Albumin/Globulin Ratio: 1.7 (ref 1.2–2.2)
Albumin: 4.3 g/dL (ref 3.8–4.9)
Alkaline Phosphatase: 89 IU/L (ref 44–121)
BUN/Creatinine Ratio: 10 (ref 9–23)
BUN: 11 mg/dL (ref 6–24)
Bilirubin Total: 0.4 mg/dL (ref 0.0–1.2)
CO2: 24 mmol/L (ref 20–29)
Calcium: 9.5 mg/dL (ref 8.7–10.2)
Chloride: 98 mmol/L (ref 96–106)
Creatinine, Ser: 1.08 mg/dL — ABNORMAL HIGH (ref 0.57–1.00)
Globulin, Total: 2.6 g/dL (ref 1.5–4.5)
Glucose: 91 mg/dL (ref 65–99)
Potassium: 4.1 mmol/L (ref 3.5–5.2)
Sodium: 137 mmol/L (ref 134–144)
Total Protein: 6.9 g/dL (ref 6.0–8.5)
eGFR: 60 mL/min/{1.73_m2} (ref >59)

## 2020-07-13 LAB — LIPID PANEL
Chol/HDL Ratio: 5.7 ratio — ABNORMAL HIGH (ref 0.0–4.4)
Cholesterol, Total: 262 mg/dL — ABNORMAL HIGH (ref 100–199)
HDL: 46 mg/dL (ref >39)
LDL Chol Calc (NIH): 195 mg/dL — ABNORMAL HIGH (ref 0–99)
Triglycerides: 118 mg/dL (ref 0–149)
VLDL Cholesterol Cal: 21 mg/dL (ref 5–40)

## 2020-07-13 LAB — CBC WITH DIFFERENTIAL/PLATELET
Basophils Absolute: 0.1 10*3/uL (ref 0.0–0.2)
Basos: 1 %
EOS (ABSOLUTE): 0.3 10*3/uL (ref 0.0–0.4)
Eos: 4 %
Hematocrit: 39.9 % (ref 34.0–46.6)
Hemoglobin: 12.7 g/dL (ref 11.1–15.9)
Immature Grans (Abs): 0 10*3/uL (ref 0.0–0.1)
Immature Granulocytes: 0 %
Lymphocytes Absolute: 3.2 10*3/uL — ABNORMAL HIGH (ref 0.7–3.1)
Lymphs: 33 %
MCH: 27.3 pg (ref 26.6–33.0)
MCHC: 31.8 g/dL (ref 31.5–35.7)
MCV: 86 fL (ref 79–97)
Monocytes Absolute: 0.5 10*3/uL (ref 0.1–0.9)
Monocytes: 5 %
Neutrophils Absolute: 5.5 10*3/uL (ref 1.4–7.0)
Neutrophils: 57 %
Platelets: 320 10*3/uL (ref 150–450)
RBC: 4.65 x10E6/uL (ref 3.77–5.28)
RDW: 13.3 % (ref 11.7–15.4)
WBC: 9.7 10*3/uL (ref 3.4–10.8)

## 2020-07-13 LAB — HEMOGLOBIN A1C
Est. average glucose Bld gHb Est-mCnc: 123 mg/dL
Hgb A1c MFr Bld: 5.9 % — ABNORMAL HIGH (ref 4.8–5.6)

## 2020-07-29 ENCOUNTER — Ambulatory Visit
Admission: RE | Admit: 2020-07-29 | Discharge: 2020-07-29 | Disposition: A | Discharge status: Home / Self Care | Payer: BC Managed Care – PPO | Source: Ambulatory Visit | Attending: Internal Medicine | Admitting: Internal Medicine

## 2020-07-29 ENCOUNTER — Other Ambulatory Visit: Payer: Self-pay

## 2020-07-29 DIAGNOSIS — N6310 Unspecified lump in the right breast, unspecified quadrant: Secondary | ICD-10-CM

## 2020-08-31 ENCOUNTER — Other Ambulatory Visit: Payer: Self-pay | Admitting: Internal Medicine

## 2020-08-31 DIAGNOSIS — I10 Essential (primary) hypertension: Secondary | ICD-10-CM

## 2020-08-31 NOTE — Telephone Encounter (Signed)
Future visit in 10 months. Will need B/P check in 5 months

## 2020-09-30 ENCOUNTER — Encounter: Payer: Self-pay | Admitting: Family Medicine

## 2020-09-30 ENCOUNTER — Telehealth: Payer: Self-pay | Admitting: Internal Medicine

## 2020-09-30 ENCOUNTER — Ambulatory Visit
Admission: RE | Admit: 2020-09-30 | Discharge: 2020-09-30 | Disposition: A | Discharge status: Home / Self Care | Payer: BC Managed Care – PPO | Attending: Family Medicine | Admitting: Family Medicine

## 2020-09-30 ENCOUNTER — Ambulatory Visit
Admission: RE | Admit: 2020-09-30 | Discharge: 2020-09-30 | Disposition: A | Discharge status: Home / Self Care | Payer: BC Managed Care – PPO | Source: Ambulatory Visit | Attending: Family Medicine | Admitting: Family Medicine

## 2020-09-30 ENCOUNTER — Ambulatory Visit (INDEPENDENT_AMBULATORY_CARE_PROVIDER_SITE_OTHER): Payer: BC Managed Care – PPO | Admitting: Family Medicine

## 2020-09-30 ENCOUNTER — Other Ambulatory Visit: Payer: Self-pay

## 2020-09-30 VITALS — BP 128/76 | HR 78 | Temp 98.3°F | Ht 67.0 in | Wt 256.0 lb

## 2020-09-30 DIAGNOSIS — M7672 Peroneal tendinitis, left leg: Secondary | ICD-10-CM

## 2020-09-30 DIAGNOSIS — M79672 Pain in left foot: Secondary | ICD-10-CM | POA: Diagnosis not present

## 2020-09-30 HISTORY — DX: Peroneal tendinitis, left leg: M76.72

## 2020-09-30 NOTE — Assessment & Plan Note (Signed)
1 plus month history of bilateral ankle / foot swelling that has resolved with relative rest, left foot pain started 3-4 days prior. Of note, patient had restarted a treadmill routine earlier in the week (~1 mile). Pain to the lateral midfoot with radiation along the lateral ankle noted with weightbearing, denies radiation proximally, no trauma.  Examination shows focality to the peroneal tendons at the left foot and ankle, single leg heel raise recreates symptoms. I have advised scheduled topical NSAID diclofenac 1% 2 times / day x 2 weeks, home exercises, and follow-up if symptoms persist. If pain persists immobilization, formal PT to be considered.

## 2020-09-30 NOTE — Progress Notes (Signed)
Primary Care / Sports Medicine Office Visit  Patient Information:  Patient ID: Heather Dudley, female DOB: 01/18/1961 Age: 60 y.o. MRN: [id number]   Heather Dudley is a pleasant 60 y.o. female presenting with the following:  Chief Complaint  Patient presents with   New Patient (Initial Visit)   Foot Pain    Left; pain started 09/27/20, no known injury; seen by [facility] yesterday, 09/29/20; X-ray left foot and left ankle today, 09/30/20; using diclofenac gel 1% topically as needed and Tylenol, heat, ice, and elevation with some relief; swelling associated; worse when bearing weight; 4/10 pain    Review of Systems pertinent details above   Patient Active Problem List   Diagnosis Date Noted   Peroneal tendinitis, left 09/30/2020   OSA on CPAP 07/12/2020   Acquired thrombophilia ([place]) 07/11/2020   Breast cyst, right 05/08/2020   Status post hysterectomy 04/03/2020   Colon cancer screening 07/13/2019   Paroxysmal atrial fibrillation ([place]) 07/11/2019   Multiple thyroid nodules 08/23/2018   Mixed hyperlipidemia 07/06/2018   Microscopic hematuria 05/04/2018   Liver hemangioma 03/26/2017   Panic disorder 06/15/2016   Macroglossia 02/05/2016   Environmental and seasonal allergies 01/02/2016   Hot flashes related to aromatase inhibitor therapy 09/08/2015   Vitamin D deficiency 04/19/2015   Cervical radiculopathy 04/17/2015   Essential hypertension 04/17/2015   History of partial thyroidectomy 04/17/2015   Obesity, Class II, BMI 35-39.9 04/17/2015   Lipoma of axilla 10/24/2014   History of left breast cancer 09/02/2010   Past Medical History:  Diagnosis Date   Basal cell carcinoma of forehead    birthmark of forehead   Breast cancer (HCC)    Breast screening, unspecified    Cellulitis and abscess of trunk    Diastolic dysfunction    a. 05/2019 Echo: EF 55-60%, no rwma, mild LVH, Gr2 DD, Nl RV size/fxn. Triv MR.   Hernia    History of chemotherapy 2012   Hypertension     Lump or mass in breast    Malignant neoplasm of breast (female), unspecified site    She underwent wide excision, mastoplasty and axillary dissection for her left breast malignancy on September 03, 2010. The primary tumor was 1 cm in diameter, and a single positive axillary node 1.3 cm in diameter. This was an ER-positive, PR slightly positive, HER-2/neu not overexpressing tumor. She tolerated her whole breast radiation without difficulty ending in late February 2013.   Malignant neoplasm of upper-outer quadrant of female breast ([place])    Neoplasm of uncertain behavior of connective and other soft tissue    Obesity, unspecified    PAF (paroxysmal atrial fibrillation) ([place]) 05/04/2017   a. 04/2019 Zio: RSR, avg HR 79. 2% PAF burden (73-175, avg 115; longest 70mins 28secs). CHA2DS2VASc = 2-->Xarelto.   Personal history of chemotherapy    Personal history of malignant neoplasm of breast    The patient underwent wide excision, mastoplasty. Dissection for a T1b, N1, M0 carcinoma left breast on September 03, 2010.The primary tumor was 1 cm in diameter, and a single positive axillary node 1.3 cm in diameter. This was an ER-positive, PR slightly positive, HER-2/neu not overexpressing tumor.  The patient completed whole breast radiation in February 2013.   Personal history of radiation therapy 2013   LEFT lumpectomy   Screening for obesity    Thyroid nodule    Outpatient Encounter Medications as of 09/30/2020  Medication Sig   amLODipine (NORVASC) 5 MG tablet TAKE 1 TABLET BY MOUTH  EVERY DAY   apixaban (ELIQUIS) 5 MG TABS tablet Take 1 tablet (5 mg total) by mouth 2 (two) times daily.   B Complex-Biotin-FA (B-COMPLEX PO) Take 1 tablet by mouth daily.   diclofenac Sodium (VOLTAREN) 1 % GEL Apply 1 application topically in the morning, at noon, in the evening, and at bedtime.   hydrochlorothiazide (MICROZIDE) 12.5 MG capsule TAKE 1 CAPSULE BY MOUTH EVERY DAY   levothyroxine (SYNTHROID) 50 MCG tablet Take 50 mcg by  mouth daily.   LORazepam (ATIVAN) 0.5 MG tablet Take 1 tablet (0.5 mg total) by mouth every 8 (eight) hours as needed for anxiety.   metoprolol succinate (TOPROL-XL) 100 MG 24 hr tablet Take 1 tablet (100 mg total) by mouth daily. Take with or immediately following a meal.   Multiple Vitamin (MULTIVITAMIN) tablet Take 1 tablet by mouth daily.   CHOLECALCIFEROL PO Take 4,000 Units by mouth daily. (Patient not taking: No sig reported)   potassium chloride SA (KLOR-CON) 20 MEQ tablet Take 1 tablet (20 mEq total) by mouth daily. (Patient not taking: No sig reported)   [DISCONTINUED] propranolol (INDERAL) 20 MG tablet TAKE 1 TABLET BY MOUTH 3 TIMES DAILY AS NEEDED.   No facility-administered encounter medications on file as of 09/30/2020.   Past Surgical History:  Procedure Laterality Date   ABDOMINAL HYSTERECTOMY  2011   birth mark removed     BREAST CYST ASPIRATION Right    negative 01/2010   BREAST EXCISIONAL BIOPSY  August 05, 2010   left breast positive 07/2010   BREAST LUMPECTOMY  2012   left breast   COLONOSCOPY  July 29, 2012   normal study.   robotic tonsillectomy Lingual tonsils Bilateral 06/08/2016   THYROID SURGERY     partially removed   TUBAL LIGATION      Vitals:   09/30/20 1338  BP: 128/76  Pulse: 78  Temp: 98.3 F (36.8 C)  SpO2: 98%   Vitals:   09/30/20 1338  Weight: 256 lb (116.1 kg)  Height: $Remove'5\' 7"'[id number]$  (1.702 m)   Body mass index is 40.1 kg/m.  No results found.   Independent interpretation of notes and tests performed by another provider:   None  Procedures performed:   None  Pertinent History, Exam, Impression, and Recommendations:   Peroneal tendinitis, left 1 plus month history of bilateral ankle / foot swelling that has resolved with relative rest, left foot pain started 3-4 days prior. Of note, patient had restarted a treadmill routine earlier in the week (~1 mile). Pain to the lateral midfoot with radiation along the lateral ankle noted with  weightbearing, denies radiation proximally, no trauma.  Examination shows focality to the peroneal tendons at the left foot and ankle, single leg heel raise recreates symptoms. I have advised scheduled topical NSAID diclofenac 1% 2 times / day x 2 weeks, home exercises, and follow-up if symptoms persist. If pain persists immobilization, formal PT to be considered.   Orders & Medications No orders of the defined types were placed in this encounter.  Orders Placed This Encounter  Procedures   DG Foot Complete Left   DG Ankle Complete Left     Return if symptoms worsen or fail to improve.     Montel Culver, MD   Primary Care Sports Medicine [place]

## 2020-09-30 NOTE — Telephone Encounter (Signed)
Left voice mail to call to set up appointment

## 2020-09-30 NOTE — Patient Instructions (Signed)
-   Use topical NSAID diclofenac 1% every AM and PM x 2 weeks - Start home exercises this weekend and gradually progress them - Can restart treadmill in a slow and graded manner after 2 weeks - Consider gait assessment and formal shoe fitting and running store - Contact our office for any persistent symptoms at the 2-4 week mark - Otherwise follow-up as-needed

## 2020-09-30 NOTE — Telephone Encounter (Signed)
Pt is calling for a sooner appt for an UC follow up. Pt is having pain in her left foot.  UC did perfom x-rays stated that did not see anything. Wanting to know if can get a sooner appt than 10/21/20. No appt was scheduled. [phone]

## 2020-10-10 ENCOUNTER — Other Ambulatory Visit: Payer: Self-pay | Admitting: Otolaryngology

## 2020-10-10 DIAGNOSIS — E041 Nontoxic single thyroid nodule: Secondary | ICD-10-CM

## 2020-10-21 ENCOUNTER — Ambulatory Visit
Admission: RE | Admit: 2020-10-21 | Discharge: 2020-10-21 | Disposition: A | Discharge status: Home / Self Care | Payer: BC Managed Care – PPO | Source: Ambulatory Visit | Attending: Otolaryngology | Admitting: Otolaryngology

## 2020-10-21 ENCOUNTER — Other Ambulatory Visit: Payer: Self-pay

## 2020-10-21 DIAGNOSIS — E041 Nontoxic single thyroid nodule: Secondary | ICD-10-CM | POA: Insufficient documentation

## 2020-10-24 ENCOUNTER — Other Ambulatory Visit: Payer: Self-pay | Admitting: Otolaryngology

## 2020-10-24 DIAGNOSIS — E041 Nontoxic single thyroid nodule: Secondary | ICD-10-CM

## 2020-10-31 ENCOUNTER — Ambulatory Visit: Payer: BC Managed Care – PPO

## 2020-11-08 DIAGNOSIS — G4733 Obstructive sleep apnea (adult) (pediatric): Secondary | ICD-10-CM | POA: Diagnosis not present

## 2020-11-11 ENCOUNTER — Other Ambulatory Visit: Payer: Self-pay | Admitting: Internal Medicine

## 2020-11-11 ENCOUNTER — Telehealth: Payer: Self-pay

## 2020-11-11 ENCOUNTER — Other Ambulatory Visit: Payer: Self-pay

## 2020-11-11 ENCOUNTER — Telehealth (INDEPENDENT_AMBULATORY_CARE_PROVIDER_SITE_OTHER): Payer: BC Managed Care – PPO | Admitting: Internal Medicine

## 2020-11-11 ENCOUNTER — Encounter: Payer: Self-pay | Admitting: Internal Medicine

## 2020-11-11 VITALS — BP 187/100 | Temp 98.7°F | Ht 67.0 in

## 2020-11-11 DIAGNOSIS — U071 COVID-19: Secondary | ICD-10-CM | POA: Diagnosis not present

## 2020-11-11 DIAGNOSIS — I10 Essential (primary) hypertension: Secondary | ICD-10-CM

## 2020-11-11 MED ORDER — AZITHROMYCIN 250 MG PO TABS: ORAL_TABLET | ORAL | 0 refills | Status: AC

## 2020-11-11 NOTE — Progress Notes (Unsigned)
Date:  11/11/2020   Name:  Heather Dudley   DOB:  03/06/1960   MRN:  [id number]  This encounter was conducted via video encounter due to the need for social distancing in light of the Covid-19 pandemic.  The patient was correctly identified.  I advised that I am conducting the visit from a secure room in my office at [facility] clinic.  The patient is located at home. The limitations of this form of encounter were discussed with the patient and he/she agreed to proceed.  Some vital signs will be absent.  Chief Complaint: No chief complaint on file. Patient become ill about one week ago.  She was tested at a community site on 11/05/20 and was positive. HPI Immunization History  Administered Date(s) Administered   PFIZER(Purple Top)SARS-COV-2 Vaccination 06/24/2019    Lab Results  Component Value Date   CREATININE 1.08 (H) 07/12/2020   BUN 11 07/12/2020   NA 137 07/12/2020   K 4.1 07/12/2020   CL 98 07/12/2020   CO2 24 07/12/2020   Lab Results  Component Value Date   CHOL 262 (H) 07/12/2020   HDL 46 07/12/2020   LDLCALC 195 (H) 07/12/2020   TRIG 118 07/12/2020   CHOLHDL 5.7 (H) 07/12/2020   Lab Results  Component Value Date   TSH 2.060 05/23/2019   Lab Results  Component Value Date   HGBA1C 5.9 (H) 07/12/2020   Lab Results  Component Value Date   WBC 9.7 07/12/2020   HGB 12.7 07/12/2020   HCT 39.9 07/12/2020   MCV 86 07/12/2020   PLT 320 07/12/2020   Lab Results  Component Value Date   ALT 10 07/12/2020   AST 17 07/12/2020   ALKPHOS 89 07/12/2020   BILITOT 0.4 07/12/2020     Review of Systems  Patient Active Problem List   Diagnosis Date Noted   Peroneal tendinitis, left 09/30/2020   OSA on CPAP 07/12/2020   Acquired thrombophilia ([place]) 07/11/2020   Breast cyst, right 05/08/2020   Status post hysterectomy 04/03/2020   Colon cancer screening 07/13/2019   Paroxysmal atrial fibrillation ([place]) 07/11/2019   Multiple thyroid nodules 08/23/2018    Mixed hyperlipidemia 07/06/2018   Microscopic hematuria 05/04/2018   Liver hemangioma 03/26/2017   Panic disorder 06/15/2016   Macroglossia 02/05/2016   Environmental and seasonal allergies 01/02/2016   Hot flashes related to aromatase inhibitor therapy 09/08/2015   Vitamin D deficiency 04/19/2015   Cervical radiculopathy 04/17/2015   Essential hypertension 04/17/2015   History of partial thyroidectomy 04/17/2015   Obesity, Class II, BMI 35-39.9 04/17/2015   Lipoma of axilla 10/24/2014   History of left breast cancer 09/02/2010    Allergies  Allergen Reactions   Ace Inhibitors Swelling   Sernivo [Betamethasone Dipropionate] Itching   Penicillins Rash    Has patient had a PCN reaction causing immediate rash, facial/tongue/throat swelling, SOB or lightheadedness with hypotension: Yes Has patient had a PCN reaction causing severe rash involving mucus membranes or skin necrosis: No Has patient had a PCN reaction that required hospitalization: No Has patient had a PCN reaction occurring within the last 10 years: No If all of the above answers are "NO", then may proceed with Cephalosporin use.    Past Surgical History:  Procedure Laterality Date   ABDOMINAL HYSTERECTOMY  2011   birth mark removed     BREAST CYST ASPIRATION Right    negative 01/2010   BREAST EXCISIONAL BIOPSY  August 05, 2010   left breast  positive 07/2010   BREAST LUMPECTOMY  2012   left breast   COLONOSCOPY  July 29, 2012   normal study.   robotic tonsillectomy Lingual tonsils Bilateral 06/08/2016   THYROID SURGERY     partially removed   TUBAL LIGATION      Social History   Tobacco Use   Smoking status: Never   Smokeless tobacco: Never  Vaping Use   Vaping Use: Never used  Substance Use Topics   Alcohol use: Not Currently   Drug use: Never     Medication list has been reviewed and updated.  No outpatient medications have been marked as taking for the 11/11/20 encounter (Orders Only) with Glean Hess, MD.    [facility] 2/9 Scores 09/30/2020 07/12/2020 07/11/2019 05/19/2019  PHQ - 2 Score 0 0 1 0  PHQ- 9 Score '5 2 1 '$ 0    GAD 7 : Generalized Anxiety Score 09/30/2020 07/12/2020 07/11/2019 03/26/2017  Nervous, Anxious, on Edge 0 1 0 3  Control/stop worrying 0 1 0 2  Worry too much - different things 1 1 0 2  Trouble relaxing 0 0 0 2  Restless 0 0 0 2  Easily annoyed or irritable 0 0 0 3  Afraid - awful might happen 0 0 0 2  Total GAD 7 Score 1 3 0 16  Anxiety Difficulty Not difficult at all - - Very difficult    BP Readings from Last 3 Encounters:  09/30/20 128/76  07/12/20 138/74  05/08/20 (!) 141/81    Physical Exam  Wt Readings from Last 3 Encounters:  09/30/20 256 lb (116.1 kg)  07/12/20 254 lb (115.2 kg)  05/08/20 255 lb 11.7 oz (116 kg)    LMP 04/01/2008 Comment: Hysterectomy was due to endometriosis. - still has ovaries.   Assessment and Plan:

## 2020-11-11 NOTE — Progress Notes (Signed)
Date:  11/11/2020   Name:  REMMY Dudley   DOB:  1960/04/03   MRN:  [id number]  This encounter was conducted via video encounter due to the need for social distancing in light of the Covid-19 pandemic.  The patient was correctly identified.  I advised that I am conducting the visit from a secure room in my office at [facility] clinic.  The patient is located at home. The limitations of this form of encounter were discussed with the patient and he/she agreed to proceed.  Some vital signs will be absent.  Chief Complaint: Covid Positive (9/13, was getting better but now getting worse, sore throat, cough yellow mucous sometimes, headache, did have chills not as bad no fever )  Cough This is a new problem. The current episode started in the past 7 days. The problem has been gradually improving (but now throat is sore again; coughing up yellow. Fever resolved.). Associated symptoms include chills, ear pain, a fever, headaches, a sore throat and wheezing.   Immunization History  Administered Date(s) Administered   PFIZER(Purple Top)SARS-COV-2 Vaccination 06/24/2019    Lab Results  Component Value Date   CREATININE 1.08 (H) 07/12/2020   BUN 11 07/12/2020   NA 137 07/12/2020   K 4.1 07/12/2020   CL 98 07/12/2020   CO2 24 07/12/2020   Lab Results  Component Value Date   CHOL 262 (H) 07/12/2020   HDL 46 07/12/2020   LDLCALC 195 (H) 07/12/2020   TRIG 118 07/12/2020   CHOLHDL 5.7 (H) 07/12/2020   Lab Results  Component Value Date   TSH 2.060 05/23/2019   Lab Results  Component Value Date   HGBA1C 5.9 (H) 07/12/2020   Lab Results  Component Value Date   WBC 9.7 07/12/2020   HGB 12.7 07/12/2020   HCT 39.9 07/12/2020   MCV 86 07/12/2020   PLT 320 07/12/2020   Lab Results  Component Value Date   ALT 10 07/12/2020   AST 17 07/12/2020   ALKPHOS 89 07/12/2020   BILITOT 0.4 07/12/2020     Review of Systems  Constitutional:  Positive for chills and fever.  HENT:   Positive for ear pain and sore throat.   Respiratory:  Positive for cough and wheezing.   Cardiovascular:  Positive for leg swelling.  Gastrointestinal:  Negative for vomiting.  Musculoskeletal:  Positive for arthralgias.  Neurological:  Positive for headaches. Negative for dizziness.  Psychiatric/Behavioral:  Negative for dysphoric mood and sleep disturbance. The patient is not nervous/anxious.    Patient Active Problem List   Diagnosis Date Noted   Peroneal tendinitis, left 09/30/2020   OSA on CPAP 07/12/2020   Acquired thrombophilia ([place]) 07/11/2020   Breast cyst, right 05/08/2020   Status post hysterectomy 04/03/2020   Colon cancer screening 07/13/2019   Paroxysmal atrial fibrillation ([employer]) 07/11/2019   Multiple thyroid nodules 08/23/2018   Mixed hyperlipidemia 07/06/2018   Microscopic hematuria 05/04/2018   Liver hemangioma 03/26/2017   Panic disorder 06/15/2016   Macroglossia 02/05/2016   Environmental and seasonal allergies 01/02/2016   Hot flashes related to aromatase inhibitor therapy 09/08/2015   Vitamin D deficiency 04/19/2015   Cervical radiculopathy 04/17/2015   Essential hypertension 04/17/2015   History of partial thyroidectomy 04/17/2015   Obesity, Class II, BMI 35-39.9 04/17/2015   Lipoma of axilla 10/24/2014   History of left breast cancer 09/02/2010    Allergies  Allergen Reactions   Ace Inhibitors Swelling   Sernivo [Betamethasone Dipropionate] Itching  Penicillins Rash    Has patient had a PCN reaction causing immediate rash, facial/tongue/throat swelling, SOB or lightheadedness with hypotension: Yes Has patient had a PCN reaction causing severe rash involving mucus membranes or skin necrosis: No Has patient had a PCN reaction that required hospitalization: No Has patient had a PCN reaction occurring within the last 10 years: No If all of the above answers are "NO", then may proceed with Cephalosporin use.    Past Surgical History:  Procedure  Laterality Date   ABDOMINAL HYSTERECTOMY  2011   birth mark removed     BREAST CYST ASPIRATION Right    negative 01/2010   BREAST EXCISIONAL BIOPSY  August 05, 2010   left breast positive 07/2010   BREAST LUMPECTOMY  2012   left breast   COLONOSCOPY  July 29, 2012   normal study.   robotic tonsillectomy Lingual tonsils Bilateral 06/08/2016   THYROID SURGERY     partially removed   TUBAL LIGATION      Social History   Tobacco Use   Smoking status: Never   Smokeless tobacco: Never  Vaping Use   Vaping Use: Never used  Substance Use Topics   Alcohol use: Not Currently   Drug use: Never     Medication list has been reviewed and updated.  Current Meds  Medication Sig   amLODipine (NORVASC) 5 MG tablet TAKE 1 TABLET BY MOUTH EVERY DAY   apixaban (ELIQUIS) 5 MG TABS tablet Take 1 tablet (5 mg total) by mouth 2 (two) times daily.   B Complex-Biotin-FA (B-COMPLEX PO) Take 1 tablet by mouth daily.   hydrochlorothiazide (MICROZIDE) 12.5 MG capsule TAKE 1 CAPSULE BY MOUTH EVERY DAY   levothyroxine (SYNTHROID) 75 MCG tablet Take 75 mcg by mouth daily.   LORazepam (ATIVAN) 0.5 MG tablet Take 1 tablet (0.5 mg total) by mouth every 8 (eight) hours as needed for anxiety.   metoprolol succinate (TOPROL-XL) 100 MG 24 hr tablet Take 1 tablet (100 mg total) by mouth daily. Take with or immediately following a meal.   Multiple Vitamin (MULTIVITAMIN) tablet Take 1 tablet by mouth daily.    PHQ 2/9 Scores 11/11/2020 09/30/2020 07/12/2020 07/11/2019  PHQ - 2 Score 0 0 0 1  PHQ- 9 Score 0 '5 2 1    '$ GAD 7 : Generalized Anxiety Score 11/11/2020 09/30/2020 07/12/2020 07/11/2019  Nervous, Anxious, on Edge 1 0 1 0  Control/stop worrying 1 0 1 0  Worry too much - different things '1 1 1 '$ 0  Trouble relaxing 0 0 0 0  Restless 0 0 0 0  Easily annoyed or irritable 0 0 0 0  Afraid - awful might happen 0 0 0 0  Total GAD 7 Score '3 1 3 '$ 0  Anxiety Difficulty - Not difficult at all - -    BP Readings from Last 3  Encounters:  11/11/20 (!) 187/100  09/30/20 128/76  07/12/20 138/74    Physical Exam Constitutional:      Appearance: Normal appearance.  HENT:     Nose:     Right Sinus: No maxillary sinus tenderness or frontal sinus tenderness.     Left Sinus: No maxillary sinus tenderness or frontal sinus tenderness.  Pulmonary:     Comments: No cough or dyspnea noted during the encounter Lymphadenopathy:     Cervical: Cervical adenopathy (on the left) present.  Neurological:     Mental Status: She is alert.  Psychiatric:        Attention and  Perception: Attention normal.        Mood and Affect: Mood normal.        Speech: Speech normal.        Cognition and Memory: Cognition normal.    Wt Readings from Last 3 Encounters:  09/30/20 256 lb (116.1 kg)  07/12/20 254 lb (115.2 kg)  05/08/20 255 lb 11.7 oz (116 kg)    BP (!) 187/100   Temp 98.7 F (37.1 C) (Oral)   Ht '5\' 7"'$  (1.702 m)   LMP 04/01/2008 Comment: Hysterectomy was due to endometriosis. - still has ovaries.   BMI 40.10 kg/m   Assessment and Plan: 1. COVID-19 virus infection Now with slight return of sx with yellow sputum.  Fever, myalgia and headache are all improved. No worrisome sx otherwise - pt appears in NAD. Continue rest, fluids and otc cough suppressants - azithromycin (ZITHROMAX Z-PAK) 250 MG tablet; UAD  Dispense: 6 each; Refill: 0  2. Essential hypertension Elevated likely due to illness Recommend checking over the next several days at rest and call if persistently elevated Limit sodium intake and continue current medications  I spent 12 minutes on this encounter. Partially dictated using Editor, commissioning. Any errors are unintentional.  Halina Maidens, MD [facility]  11/11/2020

## 2020-11-11 NOTE — Telephone Encounter (Signed)
Patient called, left VM to return the call to the office for a message from Dr. Army Melia.

## 2020-11-11 NOTE — Telephone Encounter (Signed)
Copied from [place] [phone]. Topic: General - Other >> Nov 11, 2020  8:28 AM Loma [place] wrote: Pls contact pt asap Had covid 9/13 thought better, this weekend took a turn for the worse, throat sore again, coughing, sweating, really feels bad, inquiring about drugs to combat. Fu [phone], MyChart visit would be possible... pt states.

## 2020-11-11 NOTE — Telephone Encounter (Signed)

## 2020-11-11 NOTE — Telephone Encounter (Signed)
Called pt left VM to call back. Its to late for pt to get antiviral medication its past the 5 day mark. Can set up VV for cough medication.   [facility] nurse may give results to patient if they return call to clinic, a CRM has been created.  KP

## 2020-11-12 NOTE — Telephone Encounter (Signed)
Virtual visit on yesterday with Dr. Army Melia.

## 2020-11-15 ENCOUNTER — Ambulatory Visit: Payer: BC Managed Care – PPO

## 2020-11-22 ENCOUNTER — Ambulatory Visit
Admission: RE | Admit: 2020-11-22 | Discharge: 2020-11-22 | Disposition: A | Discharge status: Home / Self Care | Payer: BC Managed Care – PPO | Source: Ambulatory Visit | Attending: Otolaryngology | Admitting: Otolaryngology

## 2020-11-22 ENCOUNTER — Other Ambulatory Visit: Payer: Self-pay

## 2020-11-22 DIAGNOSIS — E041 Nontoxic single thyroid nodule: Secondary | ICD-10-CM | POA: Insufficient documentation

## 2020-11-22 NOTE — Procedures (Signed)
PROCEDURE SUMMARY:  Using direct ultrasound guidance, 5 passes were made using 25 g needles into the nodule within the isthmus of the thyroid.   Ultrasound was used to confirm needle placements on all occasions.   EBL = trace  Specimens were sent to Pathology for analysis.  See procedure note under Imaging tab in Epic for full procedure details.  Sidrah Harden S Xerxes Agrusa PA-C 11/22/2020 1:47 PM

## 2020-11-25 LAB — CYTOLOGY - NON PAP

## 2020-11-29 ENCOUNTER — Ambulatory Visit: Payer: Self-pay | Admitting: *Deleted

## 2020-11-29 NOTE — Telephone Encounter (Signed)
Reason for Disposition  [1] MILD swelling of both ankles (i.e., pedal edema) AND [2] new-onset or worsening  Answer Assessment - Initial Assessment Questions 1. ONSET: "When did the swelling start?" (e.g., minutes, hours, days)     6/22- ankle swelling  2. LOCATION: "What part of the leg is swollen?"  "Are both legs swollen or just one leg?"     Ankle swelling- both 3. SEVERITY: "How bad is the swelling?" (e.g., localized; mild, moderate, severe)  - Localized - small area of swelling localized to one leg  - MILD pedal edema - swelling limited to foot and ankle, pitting edema < 1/4 inch (6 mm) deep, rest and elevation eliminate most or all swelling  - MODERATE edema - swelling of lower leg to knee, pitting edema > 1/4 inch (6 mm) deep, rest and elevation only partially reduce swelling  - SEVERE edema - swelling extends above knee, facial or hand swelling present      mild 4. REDNESS: "Does the swelling look red or infected?"     no 5. PAIN: "Is the swelling painful to touch?" If Yes, ask: "How painful is it?"   (Scale 1-10; mild, moderate or severe)     no 6. FEVER: "Do you have a fever?" If Yes, ask: "What is it, how was it measured, and when did it start?"      no 7. CAUSE: "What do you think is causing the leg swelling?"     unsure 8. MEDICAL HISTORY: "Do you have a history of heart failure, kidney disease, liver failure, or cancer?"     Breast cancer hx, Afib- patient can feel it- recently not as bad- patient had increase in medication 9. RECURRENT SYMPTOM: "Have you had leg swelling before?" If Yes, ask: "When was the last time?" "What happened that time?"     No- was treated for sprained tendon- but swelling occurred before that incident 10. OTHER SYMPTOMS: "Do you have any other symptoms?" (e.g., chest pain, difficulty breathing)       Since COVID patient does have occasional cough, changes in the way chest feels, higher BP 11. PREGNANCY: "Is there any chance you are pregnant?"  "When was your last menstrual period?"       N/a  Protocols used: Leg Swelling and Edema-A-AH

## 2020-11-29 NOTE — Telephone Encounter (Signed)
Patient is calling to make an appointment- she had virtual visit with provider and was advised to follow up in the office- for her edema. Patient is calling to make that appointment. Patient reports bilateral edema in ankles. No pain or redness and no other symptoms. Swelling goes away with elevation- but it has been present daily with activity since 6/22.

## 2020-12-04 ENCOUNTER — Encounter: Payer: Self-pay | Admitting: Internal Medicine

## 2020-12-04 ENCOUNTER — Other Ambulatory Visit: Payer: Self-pay

## 2020-12-04 ENCOUNTER — Ambulatory Visit (INDEPENDENT_AMBULATORY_CARE_PROVIDER_SITE_OTHER): Payer: BC Managed Care – PPO | Admitting: Internal Medicine

## 2020-12-04 VITALS — BP 136/82 | HR 75 | Ht 67.0 in | Wt 255.0 lb

## 2020-12-04 DIAGNOSIS — I872 Venous insufficiency (chronic) (peripheral): Secondary | ICD-10-CM | POA: Diagnosis not present

## 2020-12-04 DIAGNOSIS — R252 Cramp and spasm: Secondary | ICD-10-CM

## 2020-12-04 DIAGNOSIS — I10 Essential (primary) hypertension: Secondary | ICD-10-CM | POA: Diagnosis not present

## 2020-12-04 NOTE — Progress Notes (Signed)
Date:  12/04/2020   Name:  Heather Dudley   DOB:  06-24-1960   MRN:  [id number]   Chief Complaint: Edema (ankles)  Subjective:  Heather Dudley is a 60 y.o. female who presents for evaluation of edema in both ankles and feet. The edema has been moderate. Onset of symptoms was 4 months ago, and patient reports symptoms have gradually worsened since that time. The edema is present in the evening. The patient states the problem is long-standing. The swelling has been aggravated by sitting without elevation of legs. The swelling has been relieved by elevation of involved area. Associated factors include: nothing. Cardiac risk factors: dyslipidemia, hypertension, and obesity (BMI >= 30 kg/m2).   Hypertension This is a chronic problem. The problem is controlled. Pertinent negatives include no chest pain, headaches, palpitations or shortness of breath. Past treatments include beta blockers and diuretics. The current treatment provides significant improvement. There are no compliance problems.    Muscle cramps - commonly in her thighs and mid back.  Sometimes also under the ribs.  She is not exercising but does have some stretches that she has done in the past.    Lab Results  Component Value Date   CREATININE 1.08 (H) 07/12/2020   BUN 11 07/12/2020   NA 137 07/12/2020   K 4.1 07/12/2020   CL 98 07/12/2020   CO2 24 07/12/2020   Lab Results  Component Value Date   CHOL 262 (H) 07/12/2020   HDL 46 07/12/2020   LDLCALC 195 (H) 07/12/2020   TRIG 118 07/12/2020   CHOLHDL 5.7 (H) 07/12/2020   Lab Results  Component Value Date   TSH 2.060 05/23/2019   Lab Results  Component Value Date   HGBA1C 5.9 (H) 07/12/2020   Lab Results  Component Value Date   WBC 9.7 07/12/2020   HGB 12.7 07/12/2020   HCT 39.9 07/12/2020   MCV 86 07/12/2020   PLT 320 07/12/2020   Lab Results  Component Value Date   ALT 10 07/12/2020   AST 17 07/12/2020   ALKPHOS 89 07/12/2020   BILITOT  0.4 07/12/2020     Review of Systems  Constitutional:  Negative for fatigue and unexpected weight change.  HENT:  Negative for nosebleeds.   Eyes:  Negative for visual disturbance.  Respiratory:  Negative for cough, chest tightness, shortness of breath and wheezing.   Cardiovascular:  Positive for leg swelling (ankle edema at the end of the day; goes down over night). Negative for chest pain and palpitations.  Gastrointestinal:  Negative for abdominal pain, constipation and diarrhea.  Musculoskeletal:  Positive for myalgias.  Neurological:  Negative for dizziness, weakness, light-headedness and headaches.  Psychiatric/Behavioral:  Negative for dysphoric mood. The patient is not nervous/anxious.    Patient Active Problem List   Diagnosis Date Noted   Peroneal tendinitis, left 09/30/2020   OSA on CPAP 07/12/2020   Acquired thrombophilia ([place]) 07/11/2020   Breast cyst, right 05/08/2020   Status post hysterectomy 04/03/2020   Colon cancer screening 07/13/2019   Paroxysmal atrial fibrillation ([place]) 07/11/2019   Multiple thyroid nodules 08/23/2018   Mixed hyperlipidemia 07/06/2018   Microscopic hematuria 05/04/2018   Liver hemangioma 03/26/2017   Panic disorder 06/15/2016   Macroglossia 02/05/2016   Environmental and seasonal allergies 01/02/2016   Hot flashes related to aromatase inhibitor therapy 09/08/2015   Vitamin D deficiency 04/19/2015   Cervical radiculopathy 04/17/2015   Essential hypertension 04/17/2015   History of partial thyroidectomy 04/17/2015  Obesity, Class II, BMI 35-39.9 04/17/2015   Lipoma of axilla 10/24/2014   History of left breast cancer 09/02/2010    Allergies  Allergen Reactions   Ace Inhibitors Swelling   Sernivo [Betamethasone Dipropionate] Itching   Penicillins Rash    Has patient had a PCN reaction causing immediate rash, facial/tongue/throat swelling, SOB or lightheadedness with hypotension: Yes Has patient had a PCN reaction causing severe  rash involving mucus membranes or skin necrosis: No Has patient had a PCN reaction that required hospitalization: No Has patient had a PCN reaction occurring within the last 10 years: No If all of the above answers are "NO", then may proceed with Cephalosporin use.    Past Surgical History:  Procedure Laterality Date   ABDOMINAL HYSTERECTOMY  2011   birth mark removed     BREAST CYST ASPIRATION Right    negative 01/2010   BREAST EXCISIONAL BIOPSY  August 05, 2010   left breast positive 07/2010   BREAST LUMPECTOMY  2012   left breast   COLONOSCOPY  July 29, 2012   normal study.   robotic tonsillectomy Lingual tonsils Bilateral 06/08/2016   THYROID SURGERY     partially removed   TUBAL LIGATION      Social History   Tobacco Use   Smoking status: Never   Smokeless tobacco: Never  Vaping Use   Vaping Use: Never used  Substance Use Topics   Alcohol use: Not Currently   Drug use: Never     Medication list has been reviewed and updated.  Current Meds  Medication Sig   amLODipine (NORVASC) 5 MG tablet TAKE 1 TABLET BY MOUTH EVERY DAY   apixaban (ELIQUIS) 5 MG TABS tablet Take 1 tablet (5 mg total) by mouth 2 (two) times daily.   B Complex-Biotin-FA (B-COMPLEX PO) Take 1 tablet by mouth daily.   hydrochlorothiazide (MICROZIDE) 12.5 MG capsule TAKE 1 CAPSULE BY MOUTH EVERY DAY   levothyroxine (SYNTHROID) 75 MCG tablet Take 75 mcg by mouth daily.   LORazepam (ATIVAN) 0.5 MG tablet Take 1 tablet (0.5 mg total) by mouth every 8 (eight) hours as needed for anxiety.   metoprolol succinate (TOPROL-XL) 100 MG 24 hr tablet Take 1 tablet (100 mg total) by mouth daily. Take with or immediately following a meal.   Multiple Vitamin (MULTIVITAMIN) tablet Take 1 tablet by mouth daily.    PHQ 2/9 Scores 12/04/2020 11/11/2020 09/30/2020 07/12/2020  PHQ - 2 Score 0 0 0 0  PHQ- 9 Score 2 0 5 2    GAD 7 : Generalized Anxiety Score 12/04/2020 11/11/2020 09/30/2020 07/12/2020  Nervous, Anxious, on Edge  1 1 0 1  Control/stop worrying 0 1 0 1  Worry too much - different things 1 1 1 1   Trouble relaxing 0 0 0 0  Restless 1 0 0 0  Easily annoyed or irritable 0 0 0 0  Afraid - awful might happen 0 0 0 0  Total GAD 7 Score 3 3 1 3   Anxiety Difficulty Not difficult at all - Not difficult at all -    BP Readings from Last 3 Encounters:  12/04/20 136/82  11/22/20 (!) 167/93  11/11/20 (!) 187/100    Physical Exam Vitals and nursing note reviewed.  Constitutional:      General: She is not in acute distress.    Appearance: She is well-developed.  HENT:     Head: Normocephalic and atraumatic.  Cardiovascular:     Rate and Rhythm: Normal rate and regular  rhythm.     Pulses: Normal pulses.     Heart sounds: No murmur heard. Pulmonary:     Effort: Pulmonary effort is normal. No respiratory distress.     Breath sounds: No wheezing or rhonchi.  Musculoskeletal:     Right lower leg: No edema.     Left lower leg: No edema.     Comments: Spider veins but no varicose veins  Skin:    General: Skin is warm and dry.     Findings: No rash.  Neurological:     General: No focal deficit present.     Mental Status: She is alert and oriented to person, place, and time.  Psychiatric:        Mood and Affect: Mood normal.        Behavior: Behavior normal.    Wt Readings from Last 3 Encounters:  12/04/20 255 lb (115.7 kg)  09/30/20 256 lb (116.1 kg)  07/12/20 254 lb (115.2 kg)    BP 136/82   Pulse 75   Ht 5\' 7"  (1.702 m)   Wt 255 lb (115.7 kg)   LMP 04/01/2008   SpO2 96%   BMI 39.94 kg/m   Assessment and Plan: 1. Venous insufficiency of both lower extremities Mild symptoms that resolve with elevation Increase water intake, reduce dietary sodium Can consider compression stockings  2. Cramps, muscle, general Recommend regular stretching such as yoga Can also take otc magnesium supplement  3. Essential hypertension Clinically stable exam with well controlled BP. Tolerating  medications without side effects at this time. Pt to continue current regimen and low sodium diet; benefits of regular exercise as able discussed.   Partially dictated using Editor, commissioning. Any errors are unintentional.  Halina Maidens, MD [facility]  12/04/2020

## 2020-12-08 DIAGNOSIS — G4733 Obstructive sleep apnea (adult) (pediatric): Secondary | ICD-10-CM | POA: Diagnosis not present

## 2020-12-20 ENCOUNTER — Other Ambulatory Visit: Payer: Self-pay | Admitting: Internal Medicine

## 2020-12-20 ENCOUNTER — Other Ambulatory Visit: Payer: Self-pay

## 2020-12-20 ENCOUNTER — Ambulatory Visit (INDEPENDENT_AMBULATORY_CARE_PROVIDER_SITE_OTHER): Payer: BC Managed Care – PPO

## 2020-12-20 DIAGNOSIS — Z23 Encounter for immunization: Secondary | ICD-10-CM | POA: Diagnosis not present

## 2020-12-20 DIAGNOSIS — I10 Essential (primary) hypertension: Secondary | ICD-10-CM

## 2020-12-21 NOTE — Telephone Encounter (Signed)
Requested Prescriptions  Pending Prescriptions Disp Refills  . amLODipine (NORVASC) 5 MG tablet [Pharmacy Med Name: AMLODIPINE BESYLATE 5 MG TAB] 90 tablet 0    Sig: TAKE 1 TABLET BY MOUTH EVERY DAY     Cardiovascular:  Calcium Channel Blockers Passed - 12/20/2020  2:35 PM      Passed - Last BP in normal range    BP Readings from Last 1 Encounters:  12/04/20 136/82         Passed - Valid encounter within last 6 months    Recent Outpatient Visits          2 weeks ago Venous insufficiency of both lower extremities   [facility] Glean Hess, MD   1 month ago COVID-19 virus infection   [facility] Glean Hess, MD   2 months ago Peroneal tendinitis, left   [facility] Montel Culver, MD   5 months ago Annual physical exam   [facility] Glean Hess, MD   1 year ago Annual physical exam   [facility] Glean Hess, MD      Future Appointments            In 6 months Army Melia Jesse Sans, MD [facility], [facility]

## 2021-01-08 DIAGNOSIS — G4733 Obstructive sleep apnea (adult) (pediatric): Secondary | ICD-10-CM | POA: Diagnosis not present

## 2021-01-15 ENCOUNTER — Other Ambulatory Visit: Payer: Self-pay | Admitting: Cardiovascular Disease

## 2021-01-23 DIAGNOSIS — E041 Nontoxic single thyroid nodule: Secondary | ICD-10-CM | POA: Diagnosis not present

## 2021-01-31 ENCOUNTER — Encounter: Payer: Self-pay | Admitting: Internal Medicine

## 2021-01-31 ENCOUNTER — Other Ambulatory Visit: Payer: Self-pay

## 2021-01-31 ENCOUNTER — Ambulatory Visit (INDEPENDENT_AMBULATORY_CARE_PROVIDER_SITE_OTHER): Payer: BC Managed Care – PPO | Admitting: Internal Medicine

## 2021-01-31 VITALS — BP 130/80 | HR 76 | Temp 98.2°F | Ht 67.0 in | Wt 262.0 lb

## 2021-01-31 DIAGNOSIS — F411 Generalized anxiety disorder: Secondary | ICD-10-CM | POA: Diagnosis not present

## 2021-01-31 MED ORDER — LORAZEPAM 0.5 MG PO TABS: 0.5000 mg | ORAL_TABLET | Freq: Three times a day (TID) | ORAL | 0 refills | Status: DC | PRN

## 2021-01-31 NOTE — Progress Notes (Signed)
Date:  01/31/2021   Name:  Heather Dudley   DOB:  11-14-1960   MRN:  [id number]   Chief Complaint: Anxiety  Anxiety Presents for follow-up visit. Symptoms include excessive worry, insomnia, irritability, nervous/anxious behavior, restlessness and shortness of breath. Patient reports no chest pain, dizziness, nausea, palpitations or suicidal ideas. Episode frequency: happening for the past 3 days. The severity of symptoms is moderate. The patient sleeps 5 hours per night. The quality of sleep is fair. Nighttime awakenings: several.    Thyroid Problem Presents for follow-up (seeing Endo - dose adjusted about three months ago - no change in anxiety sx) visit. Symptoms include anxiety and tremors (feeling shaking inside during an episode). Patient reports no fatigue or palpitations.   Lab Results  Component Value Date   NA 137 07/12/2020   K 4.1 07/12/2020   CO2 24 07/12/2020   GLUCOSE 91 07/12/2020   BUN 11 07/12/2020   CREATININE 1.08 (H) 07/12/2020   CALCIUM 9.5 07/12/2020   EGFR 60 07/12/2020   GFRNONAA 44 (L) 05/06/2020   Lab Results  Component Value Date   CHOL 262 (H) 07/12/2020   HDL 46 07/12/2020   LDLCALC 195 (H) 07/12/2020   TRIG 118 07/12/2020   CHOLHDL 5.7 (H) 07/12/2020   Lab Results  Component Value Date   TSH 2.060 05/23/2019   Lab Results  Component Value Date   HGBA1C 5.9 (H) 07/12/2020   Lab Results  Component Value Date   WBC 9.7 07/12/2020   HGB 12.7 07/12/2020   HCT 39.9 07/12/2020   MCV 86 07/12/2020   PLT 320 07/12/2020   Lab Results  Component Value Date   ALT 10 07/12/2020   AST 17 07/12/2020   ALKPHOS 89 07/12/2020   BILITOT 0.4 07/12/2020   Lab Results  Component Value Date   VD25OH 20.0 (L) 07/01/2016     Review of Systems  Constitutional:  Positive for irritability. Negative for chills, fatigue and fever.  Respiratory:  Positive for shortness of breath. Negative for chest tightness and wheezing.   Cardiovascular:   Negative for chest pain, palpitations and leg swelling.  Gastrointestinal:  Negative for abdominal pain, nausea and vomiting.  Musculoskeletal:  Negative for arthralgias.  Neurological:  Positive for tremors (feeling shaking inside during an episode). Negative for dizziness and headaches.  Psychiatric/Behavioral:  Positive for sleep disturbance. Negative for dysphoric mood and suicidal ideas. The patient is nervous/anxious and has insomnia.    Patient Active Problem List   Diagnosis Date Noted   Peroneal tendinitis, left 09/30/2020   OSA on CPAP 07/12/2020   Acquired thrombophilia (HCC) 07/11/2020   Breast cyst, right 05/08/2020   Status post hysterectomy 04/03/2020   Colon cancer screening 07/13/2019   Paroxysmal atrial fibrillation (HCC) 07/11/2019   Multiple thyroid nodules 08/23/2018   Mixed hyperlipidemia 07/06/2018   Microscopic hematuria 05/04/2018   Liver hemangioma 03/26/2017   Panic disorder 06/15/2016   Macroglossia 02/05/2016   Environmental and seasonal allergies 01/02/2016   Hot flashes related to aromatase inhibitor therapy 09/08/2015   Vitamin D deficiency 04/19/2015   Cervical radiculopathy 04/17/2015   Essential hypertension 04/17/2015   History of partial thyroidectomy 04/17/2015   Obesity, Class II, BMI 35-39.9 04/17/2015   Lipoma of axilla 10/24/2014   History of left breast cancer 09/02/2010    Allergies  Allergen Reactions   Ace Inhibitors Swelling   Sernivo [Betamethasone Dipropionate] Itching   Penicillins Rash    Has patient had a PCN reaction causing  immediate rash, facial/tongue/throat swelling, SOB or lightheadedness with hypotension: Yes Has patient had a PCN reaction causing severe rash involving mucus membranes or skin necrosis: No Has patient had a PCN reaction that required hospitalization: No Has patient had a PCN reaction occurring within the last 10 years: No If all of the above answers are "NO", then may proceed with Cephalosporin use.     Past Surgical History:  Procedure Laterality Date   ABDOMINAL HYSTERECTOMY  2011   birth mark removed     BREAST CYST ASPIRATION Right    negative 01/2010   BREAST EXCISIONAL BIOPSY  August 05, 2010   left breast positive 07/2010   BREAST LUMPECTOMY  2012   left breast   COLONOSCOPY  July 29, 2012   normal study.   robotic tonsillectomy Lingual tonsils Bilateral 06/08/2016   THYROID SURGERY     partially removed   TUBAL LIGATION      Social History   Tobacco Use   Smoking status: Never   Smokeless tobacco: Never  Vaping Use   Vaping Use: Never used  Substance Use Topics   Alcohol use: Not Currently   Drug use: Never     Medication list has been reviewed and updated.  Current Meds  Medication Sig   amLODipine (NORVASC) 5 MG tablet TAKE 1 TABLET BY MOUTH EVERY DAY   apixaban (ELIQUIS) 5 MG TABS tablet Take 1 tablet (5 mg total) by mouth 2 (two) times daily.   B Complex-Biotin-FA (B-COMPLEX PO) Take 1 tablet by mouth daily.   hydrochlorothiazide (MICROZIDE) 12.5 MG capsule TAKE 1 CAPSULE BY MOUTH EVERY DAY   levothyroxine (SYNTHROID) 75 MCG tablet Take 75 mcg by mouth daily.   metoprolol succinate (TOPROL-XL) 100 MG 24 hr tablet Take 1 tablet (100 mg total) by mouth daily. Take with or immediately following a meal.   Multiple Vitamin (MULTIVITAMIN) tablet Take 1 tablet by mouth daily.   [DISCONTINUED] LORazepam (ATIVAN) 0.5 MG tablet Take 1 tablet (0.5 mg total) by mouth every 8 (eight) hours as needed for anxiety.    PHQ 2/9 Scores 01/31/2021 12/04/2020 11/11/2020 09/30/2020  PHQ - 2 Score 0 0 0 0  PHQ- 9 Score 3 2 0 5    GAD 7 : Generalized Anxiety Score 01/31/2021 12/04/2020 11/11/2020 09/30/2020  Nervous, Anxious, on Edge $Remov'1 1 1 'NiTPbg$ 0  Control/stop worrying 1 0 1 0  Worry too much - different things $RemoveBeforeDE'1 1 1 1  'sbnUJJxOcDXdwMj$ Trouble relaxing 2 0 0 0  Restless 2 1 0 0  Easily annoyed or irritable 2 0 0 0  Afraid - awful might happen 1 0 0 0  Total GAD 7 Score $Remov'10 3 3 1  'CytvlJ$ Anxiety  Difficulty Not difficult at all Not difficult at all - Not difficult at all    BP Readings from Last 3 Encounters:  01/31/21 130/80  12/04/20 136/82  11/22/20 (!) 167/93    Physical Exam Vitals and nursing note reviewed.  Constitutional:      General: She is not in acute distress.    Appearance: Normal appearance. She is well-developed.  HENT:     Head: Normocephalic and atraumatic.  Cardiovascular:     Rate and Rhythm: Normal rate and regular rhythm.     Pulses: Normal pulses.  Pulmonary:     Effort: Pulmonary effort is normal. No respiratory distress.     Breath sounds: No wheezing or rhonchi.  Musculoskeletal:     Cervical back: Normal range of motion.  Right lower leg: No edema.     Left lower leg: No edema.  Skin:    General: Skin is warm and dry.     Findings: No rash.  Neurological:     General: No focal deficit present.     Mental Status: She is alert and oriented to person, place, and time.  Psychiatric:        Mood and Affect: Mood normal.        Behavior: Behavior normal.    Wt Readings from Last 3 Encounters:  01/31/21 262 lb (118.8 kg)  12/04/20 255 lb (115.7 kg)  09/30/20 256 lb (116.1 kg)    BP 130/80   Pulse 76   Temp 98.2 F (36.8 C) (Oral)   Ht $R'5\' 7"'PX$  (1.702 m)   Wt 262 lb (118.8 kg)   LMP 04/01/2008   SpO2 96%   BMI 41.04 kg/m   Assessment and Plan: 1. Generalized anxiety disorder Mild recent increase in symptoms - primarily internal shakiness with some insomnia No SI/HI. Begin melatonin nightly Continue PRN Ativan for occasional anxiety attacks - LORazepam (ATIVAN) 0.5 MG tablet; Take 1 tablet (0.5 mg total) by mouth every 8 (eight) hours as needed for anxiety.  Dispense: 20 tablet; Refill: 0   Partially dictated using Editor, commissioning. Any errors are unintentional.  Halina Maidens, MD [facility]  01/31/2021

## 2021-02-07 DIAGNOSIS — G4733 Obstructive sleep apnea (adult) (pediatric): Secondary | ICD-10-CM | POA: Diagnosis not present

## 2021-02-13 ENCOUNTER — Other Ambulatory Visit: Payer: Self-pay | Admitting: Cardiovascular Disease

## 2021-02-13 NOTE — Telephone Encounter (Signed)
Please review

## 2021-02-13 NOTE — Telephone Encounter (Signed)
Prescription refill request for Eliquis received. Indication: PAF Last office visit: 03/04/20  T Gollan Scr: 1.08 on 07/12/20 Age: 60 Weight: 114.8kg  Based on above findings Eliquis 5mg  twice daily is the appropriate dose.  Pt past due for lab work and is due to see Dr Rockey Situ in Jan '23.  Message sent to [facility] to schedule appt.  Pt will need CBC/BMP ordered at that time.  Refill approved x 1

## 2021-03-12 ENCOUNTER — Ambulatory Visit: Payer: BC Managed Care – PPO | Admitting: Cardiovascular Disease

## 2021-03-12 NOTE — Progress Notes (Deleted)
Cardiology Office Note  Date:  03/12/2021   ID:  RAINA SOLE, DOB [birth year], MRN [id number]  PCP:  Glean Hess, MD   No chief complaint on file.   HPI:  Heather Dudley is a 61 y.o. female  HTN Nonsmoker No diabetes History of breast cancer on the left, lumpectomy and lymph node dissection.  Completed chemotherapy remote chest pain Dx with CPAP, not on CPAP, 1-2 years ago Presents for f/u of her chest pain, tachycardia, paroxysmal atrial fibrillation  Last seen in clinic November 2021 At that time having episodes of atrial fibrillation lasting up to 1 hour, taking extra propranolol Was compliant with Xarelto and metoprolol 75 mg daily  Prefered not to take antiarrhythmic on a regular basis As flecainide It was recommended if she started to take this on a regular basis that she call our office, would need additional work-up including treadmill  Seen in emergency room January 24, 2020 Had discoordination, anxiety, atypical chest pain Night before had episode of atrial fibrillation  Seen by one of our providers January 26, 2020 Recommended she start her flecainide 50 twice daily Changed from Xarelto to Eliquis for unclear intolerance She tried flecainide for 3 days, did not feel well and stopped the flecainide  Tolerating metoprolol succinate 756 daily certain foods make it worse, triggers  Sometimes tightens, "in a ball",  Happened with mountain dew  EKG personally reviewed by myself on todays visit NSR rate 78 bpm no significant ST-T wave changes  Other past medical history reviewed Echo April 2021 Left ventricular ejection fraction, by estimation, is 55 to 60%. The  left ventricle has normal function Left ventricular diastolic parameters  are consistent with Grade II diastolic dysfunction (pseudonormalization).  moderately elevated pulmonary artery systolic pressure.   Monitor reviewed avg HR of 79 bpm. Predominant underlying rhythm was Sinus  Rhythm. Atrial Fibrillation occurred (2% burden), ranging from 73-175 bpm (avg of 115 bpm), the longest lasting 52 mins 28 secs with an avg rate of 118 bpm.  CT chest:  No significant coronary calcified atherosclerosis or aortic plaque  History of lumpectomy and lymph node dissection.  Sometimes this causes discomfort in her left arm  PMH:   has a past medical history of Basal cell carcinoma of forehead, Breast cancer ([place]), Breast screening, unspecified, Cellulitis and abscess of trunk, Diastolic dysfunction, Hernia, History of chemotherapy (2012), Hypertension, Lump or mass in breast, Malignant neoplasm of breast (female), unspecified site, Malignant neoplasm of upper-outer quadrant of female breast ([place]), Neoplasm of uncertain behavior of connective and other soft tissue, Obesity, unspecified, PAF (paroxysmal atrial fibrillation) ([place]) (05/04/2017), Personal history of chemotherapy, Personal history of malignant neoplasm of breast, Personal history of radiation therapy (2013), Screening for obesity, and Thyroid nodule.  PSH:    Past Surgical History:  Procedure Laterality Date   ABDOMINAL HYSTERECTOMY  2011   birth mark removed     BREAST CYST ASPIRATION Right    negative 01/2010   BREAST EXCISIONAL BIOPSY  August 05, 2010   left breast positive 07/2010   BREAST LUMPECTOMY  2012   left breast   COLONOSCOPY  July 29, 2012   normal study.   robotic tonsillectomy Lingual tonsils Bilateral 06/08/2016   THYROID SURGERY     partially removed   TUBAL LIGATION      Current Outpatient Medications  Medication Sig Dispense Refill   amLODipine (NORVASC) 5 MG tablet TAKE 1 TABLET BY MOUTH EVERY DAY 90 tablet 0   apixaban (ELIQUIS) 5  MG TABS tablet TAKE 1 TABLET BY MOUTH TWICE A DAY 60 tablet 1   B Complex-Biotin-FA (B-COMPLEX PO) Take 1 tablet by mouth daily.     hydrochlorothiazide (MICROZIDE) 12.5 MG capsule TAKE 1 CAPSULE BY MOUTH EVERY DAY 90 capsule 0   levothyroxine (SYNTHROID) 75 MCG  tablet Take 75 mcg by mouth daily.     LORazepam (ATIVAN) 0.5 MG tablet Take 1 tablet (0.5 mg total) by mouth every 8 (eight) hours as needed for anxiety. 20 tablet 0   metoprolol succinate (TOPROL-XL) 100 MG 24 hr tablet Take 1 tablet (100 mg total) by mouth daily. Take with or immediately following a meal. 90 tablet 3   Multiple Vitamin (MULTIVITAMIN) tablet Take 1 tablet by mouth daily.     No current facility-administered medications for this visit.     Allergies:   Ace inhibitors, Sernivo [betamethasone dipropionate], and Penicillins   Social History:  The patient  reports that she has never smoked. She has never used smokeless tobacco. She reports that she does not currently use alcohol. She reports that she does not use drugs.   Family History:   family history includes Cancer in her cousin, maternal uncle, mother, and paternal aunt; Colon cancer in an other family member; Heart failure in her father; Ovarian cancer in an other family member.    Review of Systems: Review of Systems  Constitutional: Negative.   HENT: Negative.    Respiratory: Negative.    Cardiovascular:  Positive for chest pain and palpitations.  Gastrointestinal: Negative.   Musculoskeletal: Negative.   Neurological: Negative.   Psychiatric/Behavioral: Negative.    All other systems reviewed and are negative.  PHYSICAL EXAM: VS:  LMP 04/01/2008  , BMI There is no height or weight on file to calculate BMI. Constitutional:  oriented to person, place, and time. No distress.  HENT:  Head: Grossly normal Eyes:  no discharge. No scleral icterus.  Neck: No JVD, no carotid bruits  Cardiovascular: Regular rate and rhythm, no murmurs appreciated Pulmonary/Chest: Clear to auscultation bilaterally, no wheezes or rails Abdominal: Soft.  no distension.  no tenderness.  Musculoskeletal: Normal range of motion Neurological:  normal muscle tone. Coordination normal. No atrophy Skin: Skin warm and dry Psychiatric:  normal affect, pleasant   Recent Labs: 07/12/2020: ALT 10; BUN 11; Creatinine, Ser 1.08; Hemoglobin 12.7; Platelets 320; Potassium 4.1; Sodium 137    Lipid Panel Lab Results  Component Value Date   CHOL 262 (H) 07/12/2020   HDL 46 07/12/2020   LDLCALC 195 (H) 07/12/2020   TRIG 118 07/12/2020     Wt Readings from Last 3 Encounters:  01/31/21 262 lb (118.8 kg)  12/04/20 255 lb (115.7 kg)  09/30/20 256 lb (116.1 kg)     ASSESSMENT AND PLAN:  Paroxysmal atrial fibrillation On metoprolol with Eliquis Did not tolerate flecainide 50 twice daily Discussed other antiarrhythmics that might be available Could consider propafenone, [place] Will stay on metoprolol succinate 100 for now with propranolol She has been getting along okay, minimal symptoms  Chest pain, unspecified type - CT scan chest reviewed no coronary calcification, no aortic atherosclerosis Recent episodes atypical discomfort, EKG unchanged, enzymes normal Typically presents at rest  Hyperlipidemia, mixed She prefers no medication at this time Lifestyle modification recommended  Essential hypertension - Plan: EKG 12-Lead continue metoprolol with low-dose HCTZ  Malignant neoplasm of left breast in female, estrogen receptor positive, unspecified site of breast ([place]) Previous chemotherapy and radiation on the left Lymphedema left arm Stable  Obesity Lifestyle modification recommended   Total encounter time more than 25 minutes  Greater than 50% was spent in counseling and coordination of care with the patient   No orders of the defined types were placed in this encounter.    Signed, Esmond Plants, M.D., Ph.D. 03/12/2021  [place], [place]

## 2021-03-16 NOTE — Progress Notes (Signed)
Cardiology Office Note  Date:  03/17/2021   ID:  [name], [name] 08-06-1960, MRN [id number]  PCP:  Glean Hess, MD   Chief Complaint  Patient presents with   12 month follow up     Patient c/o recent A-Fib spells. Medications reviewed by the patient verbally.     HPI:  Heather Dudley is a 61 y.o. female  HTN Nonsmoker No diabetes History of breast cancer on the left, lumpectomy and lymph node dissection.  Completed chemotherapy remote chest pain Dx with CPAP, not on CPAP, 1-2 years ago Presents for f/u of her chest pain, tachycardia, paroxysmal atrial fibrillation  Last seen in clinic January 2022  Flared up back, was on back meds/pain pills Little better  Rare tachycardia, may have been brought on by her back pain Prefers no antiarrhythmic Was previously on flecainide  Rare propranolol use Now on CPAP, nasal canula, slips often at nighttime  Lab work reviewed A1C 5.9 Total chol 262 LDL 195 Does not want rescue medication history Prefers to pursue lifestyle modification  Not on flecainide  EKG personally reviewed by myself on todays visit Nsr rate 72 no significant ST-T wave changes  past medical history reviewed Prefered not to take antiarrhythmic on a regular basis  Seen in emergency room January 24, 2020 Had discoordination, anxiety, atypical chest pain Night before had episode of atrial fibrillation  Echo April 2021 Left ventricular ejection fraction, by estimation, is 55 to 60%. The  left ventricle has normal function Left ventricular diastolic parameters  are consistent with Grade II diastolic dysfunction (pseudonormalization).  moderately elevated pulmonary artery systolic pressure.   Monitor reviewed avg HR of 79 bpm. Predominant underlying rhythm was Sinus Rhythm. Atrial Fibrillation occurred (2% burden), ranging from 73-175 bpm (avg of 115 bpm), the longest lasting 52 mins 28 secs with an avg rate of 118 bpm.  CT chest:  No  significant coronary calcified atherosclerosis or aortic plaque  History of lumpectomy and lymph node dissection.  Sometimes this causes discomfort in her left arm  PMH:   has a past medical history of Basal cell carcinoma of forehead, Breast cancer ([place]), Breast screening, unspecified, Cellulitis and abscess of trunk, Diastolic dysfunction, Hernia, History of chemotherapy (2012), Hypertension, Lump or mass in breast, Malignant neoplasm of breast (female), unspecified site, Malignant neoplasm of upper-outer quadrant of female breast ([place]), Neoplasm of uncertain behavior of connective and other soft tissue, Obesity, unspecified, PAF (paroxysmal atrial fibrillation) ([place]) (05/04/2017), Personal history of chemotherapy, Personal history of malignant neoplasm of breast, Personal history of radiation therapy (2013), Screening for obesity, and Thyroid nodule.  PSH:    Past Surgical History:  Procedure Laterality Date   ABDOMINAL HYSTERECTOMY  2011   birth mark removed     BREAST CYST ASPIRATION Right    negative 01/2010   BREAST EXCISIONAL BIOPSY  August 05, 2010   left breast positive 07/2010   BREAST LUMPECTOMY  2012   left breast   COLONOSCOPY  July 29, 2012   normal study.   robotic tonsillectomy Lingual tonsils Bilateral 06/08/2016   THYROID SURGERY     partially removed   TUBAL LIGATION      Current Outpatient Medications  Medication Sig Dispense Refill   amLODipine (NORVASC) 5 MG tablet TAKE 1 TABLET BY MOUTH EVERY DAY 90 tablet 0   apixaban (ELIQUIS) 5 MG TABS tablet TAKE 1 TABLET BY MOUTH TWICE A DAY 60 tablet 1   B Complex-Biotin-FA (B-COMPLEX PO) Take 1 tablet  by mouth daily.     hydrochlorothiazide (MICROZIDE) 12.5 MG capsule TAKE 1 CAPSULE BY MOUTH EVERY DAY 90 capsule 0   levothyroxine (SYNTHROID) 75 MCG tablet Take 75 mcg by mouth daily.     LORazepam (ATIVAN) 0.5 MG tablet Take 1 tablet (0.5 mg total) by mouth every 8 (eight) hours as needed for anxiety. 20 tablet 0    metoprolol succinate (TOPROL-XL) 100 MG 24 hr tablet Take 1 tablet (100 mg total) by mouth daily. Take with or immediately following a meal. 90 tablet 3   Multiple Vitamin (MULTIVITAMIN) tablet Take 1 tablet by mouth daily.     No current facility-administered medications for this visit.     Allergies:   Ace inhibitors, Sernivo [betamethasone dipropionate], and Penicillins   Social History:  The patient  reports that she has never smoked. She has never used smokeless tobacco. She reports that she does not currently use alcohol. She reports that she does not use drugs.   Family History:   family history includes Cancer in her cousin, maternal uncle, mother, and paternal aunt; Colon cancer in an other family member; Heart failure in her father; Ovarian cancer in an other family member.    Review of Systems: Review of Systems  Constitutional: Negative.   HENT: Negative.    Respiratory: Negative.    Cardiovascular:  Positive for palpitations.  Gastrointestinal: Negative.   Musculoskeletal: Negative.   Neurological: Negative.   Psychiatric/Behavioral: Negative.    All other systems reviewed and are negative.  PHYSICAL EXAM: VS:  BP 140/84 (BP Location: Left Arm, Patient Position: Sitting, Cuff Size: Large)    Pulse 72    Ht 5\' 7"  (1.702 m)    Wt 264 lb 8 oz (120 kg)    LMP 04/01/2008    SpO2 99%    BMI 41.43 kg/m  , BMI Body mass index is 41.43 kg/m. Constitutional:  oriented to person, place, and time. No distress.  HENT:  Head: Grossly normal Eyes:  no discharge. No scleral icterus.  Neck: No JVD, no carotid bruits  Cardiovascular: Regular rate and rhythm, no murmurs appreciated Pulmonary/Chest: Clear to auscultation bilaterally, no wheezes or rails Abdominal: Soft.  no distension.  no tenderness.  Musculoskeletal: Normal range of motion Neurological:  normal muscle tone. Coordination normal. No atrophy Skin: Skin warm and dry Psychiatric: normal affect, pleasant   Recent  Labs: 07/12/2020: ALT 10; BUN 11; Creatinine, Ser 1.08; Hemoglobin 12.7; Platelets 320; Potassium 4.1; Sodium 137    Lipid Panel Lab Results  Component Value Date   CHOL 262 (H) 07/12/2020   HDL 46 07/12/2020   LDLCALC 195 (H) 07/12/2020   TRIG 118 07/12/2020     Wt Readings from Last 3 Encounters:  03/17/21 264 lb 8 oz (120 kg)  01/31/21 262 lb (118.8 kg)  12/04/20 255 lb (115.7 kg)     ASSESSMENT AND PLAN:  Paroxysmal atrial fibrillation On metoprolol with Eliquis Off flecainide, uses propranolol as needed Does not want other antiarrhythmic, feels that she can control them  Chest pain, unspecified type - CT scan chest reviewed no coronary calcification, no aortic atherosclerosis CT scan 2020 reviewed again today, no significant coronary calcification or aortic atherosclerosis  Hyperlipidemia, mixed She prefers no medication at this time Left a modification recommended We did discuss statins, Zetia, even injections  Essential hypertension -  Recommend she monitor blood pressure in the morning Has only been monitoring pressure in the evening before she takes her evening medications  Malignant neoplasm  of left breast in female, estrogen receptor positive, unspecified site of breast ([place]) Previous chemotherapy and radiation on the left Lymphedema left arm Stable Obesity Lifestyle modification recommended   Total encounter time more than 25 minutes  Greater than 50% was spent in counseling and coordination of care with the patient   Orders Placed This Encounter  Procedures   EKG 12-Lead     Signed, Esmond Plants, M.D., Ph.D. 03/17/2021  [place], [place]

## 2021-03-17 ENCOUNTER — Encounter: Payer: Self-pay | Admitting: Cardiovascular Disease

## 2021-03-17 ENCOUNTER — Other Ambulatory Visit: Payer: Self-pay

## 2021-03-17 ENCOUNTER — Ambulatory Visit: Payer: BC Managed Care – PPO | Admitting: Cardiovascular Disease

## 2021-03-17 ENCOUNTER — Other Ambulatory Visit: Payer: Self-pay | Admitting: *Deleted

## 2021-03-17 VITALS — BP 140/84 | HR 72 | Ht 67.0 in | Wt 264.5 lb

## 2021-03-17 DIAGNOSIS — R079 Chest pain, unspecified: Secondary | ICD-10-CM | POA: Diagnosis not present

## 2021-03-17 DIAGNOSIS — I479 Paroxysmal tachycardia, unspecified: Secondary | ICD-10-CM | POA: Diagnosis not present

## 2021-03-17 DIAGNOSIS — I480 Paroxysmal atrial fibrillation: Secondary | ICD-10-CM | POA: Diagnosis not present

## 2021-03-17 DIAGNOSIS — R0789 Other chest pain: Secondary | ICD-10-CM

## 2021-03-17 DIAGNOSIS — E782 Mixed hyperlipidemia: Secondary | ICD-10-CM

## 2021-03-17 DIAGNOSIS — I10 Essential (primary) hypertension: Secondary | ICD-10-CM | POA: Diagnosis not present

## 2021-03-17 MED ORDER — HYDROCHLOROTHIAZIDE 12.5 MG PO CAPS: 12.5000 mg | ORAL_CAPSULE | Freq: Every day | ORAL | 3 refills | Status: DC

## 2021-03-17 MED ORDER — APIXABAN 5 MG PO TABS: 5.0000 mg | ORAL_TABLET | Freq: Two times a day (BID) | ORAL | 5 refills | Status: DC

## 2021-03-17 MED ORDER — METOPROLOL SUCCINATE ER 100 MG PO TB24: 100.0000 mg | ORAL_TABLET | Freq: Every day | ORAL | 3 refills | Status: DC

## 2021-03-17 MED ORDER — AMLODIPINE BESYLATE 5 MG PO TABS: 5.0000 mg | ORAL_TABLET | Freq: Every day | ORAL | 3 refills | Status: DC

## 2021-03-17 NOTE — Patient Instructions (Addendum)
Medication Instructions:  No changes  Refills   If you need a refill on your cardiac medications before your next appointment, please call your pharmacy.   Lab work: No new labs needed  Testing/Procedures: No new testing needed  Follow-Up: At [facility], you and your health needs are our priority.  As part of our continuing mission to provide you with exceptional heart care, we have created designated Provider Care Teams.  These Care Teams include your primary Cardiologist (physician) and Advanced Practice Providers (APPs -  Physician Assistants and Nurse Practitioners) who all work together to provide you with the care you need, when you need it.  You will need a follow up appointment in 12 months  Providers on your designated Care Team:   Murray Hodgkins, NP Christell Faith, PA-C Cadence Kathlen Mody, [place]  COVID-19 Vaccine Information can be found at: [email or web address] For questions related to vaccine distribution or appointments, please email [email or web address] or call [phone].

## 2021-03-17 NOTE — Telephone Encounter (Signed)
Prescription refill request for Eliquis received. Indication: PAF Last office visit: 03/17/21  Johnny Bridge MD Scr: 1.08 on 07/12/20 Age:  61 Weight: 120kg  Based on above findings Eliquis 5mg  twice daily is the appropriate dose.  Refill approved.

## 2021-03-18 ENCOUNTER — Other Ambulatory Visit: Payer: Self-pay | Admitting: Cardiovascular Disease

## 2021-03-21 ENCOUNTER — Other Ambulatory Visit: Payer: Self-pay | Admitting: Internal Medicine

## 2021-03-21 DIAGNOSIS — U071 COVID-19: Secondary | ICD-10-CM

## 2021-03-21 DIAGNOSIS — F411 Generalized anxiety disorder: Secondary | ICD-10-CM

## 2021-03-21 NOTE — Telephone Encounter (Signed)
Requested medication (s) are due for refill today: yes for Ativan  Requested medication (s) are on the active medication list: yes Ativan, No Azithromycin  Last refill:  01/31/21 #20/0  Future visit scheduled: yes  Notes to clinic:  Unable to refill per protocol, cannot delegate. Azithromycin - Unable to refill per protocol, medication not assigned to the refill protocol.      Requested Prescriptions  Pending Prescriptions Disp Refills   LORazepam (ATIVAN) 0.5 MG tablet [Pharmacy Med Name: LORAZEPAM 0.5 MG TABLET] 20 tablet 0    Sig: TAKE 1 TABLET BY MOUTH EVERY 8 HOURS AS NEEDED FOR ANXIETY.     Not Delegated - Psychiatry:  Anxiolytics/Hypnotics Failed - 03/21/2021  4:34 PM      Failed - This refill cannot be delegated      Failed - Urine Drug Screen completed in last 360 days      Passed - Valid encounter within last 6 months    Recent Outpatient Visits           1 month ago Generalized anxiety disorder   [place], MD   3 months ago Venous insufficiency of both lower extremities   [facility] Glean Hess, MD   4 months ago COVID-19 virus infection   [facility] Glean Hess, MD   5 months ago Peroneal tendinitis, left   [facility] Montel Culver, MD   8 months ago Annual physical exam   [facility] Glean Hess, MD       Future Appointments             In 3 months Army Melia Jesse Sans, MD [facility]             azithromycin (ZITHROMAX) 250 MG tablet [Pharmacy Med Name: AZITHROMYCIN 250 MG TABLET] 6 tablet     Sig: TAKE 2 TABLETS BY MOUTH TODAY, THEN TAKE 1 TABLET DAILY FOR 4 DAYS     Off-Protocol Failed - 03/21/2021  4:34 PM      Failed - Medication not assigned to a protocol, review manually.      Passed - Valid encounter within last 12 months    Recent Outpatient Visits           1 month ago Generalized anxiety disorder   [place], MD   3 months ago Venous insufficiency of both lower extremities   [facility] Glean Hess, MD   4 months ago COVID-19 virus infection   [facility] Glean Hess, MD   5 months ago Peroneal tendinitis, left   [facility] Montel Culver, MD   8 months ago Annual physical exam   [facility] Glean Hess, MD       Future Appointments             In 3 months Army Melia Jesse Sans, MD [facility], [facility]

## 2021-03-24 NOTE — Telephone Encounter (Signed)
Refill if appropriate.  Please advise. (Tried to refuse Z-pak, but would not let me)

## 2021-03-28 ENCOUNTER — Ambulatory Visit (INDEPENDENT_AMBULATORY_CARE_PROVIDER_SITE_OTHER): Payer: BC Managed Care – PPO

## 2021-03-28 ENCOUNTER — Other Ambulatory Visit: Payer: Self-pay

## 2021-03-28 ENCOUNTER — Ambulatory Visit: Payer: BC Managed Care – PPO

## 2021-03-28 DIAGNOSIS — Z23 Encounter for immunization: Secondary | ICD-10-CM | POA: Diagnosis not present

## 2021-03-28 NOTE — Progress Notes (Signed)
Shingrix ordered.

## 2021-03-31 ENCOUNTER — Ambulatory Visit: Payer: BC Managed Care – PPO

## 2021-04-07 ENCOUNTER — Telehealth: Payer: Self-pay | Admitting: Cardiovascular Disease

## 2021-04-07 NOTE — Telephone Encounter (Signed)
Was able to return call to Heather Dudley in reference to wanting to increase her metoprolol. Pt reports she has been having palpitations even after taking her nightly Toprol 100 mg daily.  Question pt on her propanolol, pt reports has taken it a few times, but was unsure she could take with her Toprol. Advised may take Propanolol 20 mg TID PRN for her palpitations. If taking her propanolol TID everyday, may be warranted for mediation change. Pt reprts will try the PRN TID 20 mg Propanolol to see how she does. Not wanting to increase her Toprol as she contributes that to weight gain, does want an upcoming appt for discuss different medication options.  Appt made with Dr. Rockey Situ (only wants [place]) for 3/8 08:40 am. Requested pt to bring in her BP log and HR log to appt for review so proper medication can be adjusted. BP/HR while are rest and try to record HR during palpitations spells. Pt verbalized understanding.  Otherwise all questions were address and no additional concerns at this time. Heather Dudley thankful for the return call and advice. Agreeable to plan, will call back for anything further.

## 2021-04-07 NOTE — Telephone Encounter (Signed)
Patient calling to discuss possibly increasing metoprolol medication or make a change all together Patient will make an appointment if need be Please call

## 2021-04-21 ENCOUNTER — Telehealth: Payer: Self-pay | Admitting: Cardiovascular Disease

## 2021-04-21 NOTE — Telephone Encounter (Signed)
Spoke with patient and reviewed her concerns. She has noticed increased palpitations that are now making her not feel well. She reports history of low potassium as well and wonders if that could be the cause. Discussed palpitations and how these are common and not dangerous.   She verbalized understanding but states that now with her symptoms she is worried. Symptoms are lightheadedness, fatigue, and 3 days ago she woke up feeling a little off and disoriented. After an hour she regained her balance but feels these symptoms are related to the palpitations. She would like to see if labs could be done and appointment with provider. Inquired if she had been taking the propranolol as needed and she has. She states that one pill was not helping so she took 2 and that helped for a while. Discussed this medication can be taken 3 times a day for her symptoms and encouraged her to try that. She requested appointment because she is worried about her heart. Provided reassurance and was able to schedule for her to come in. She was appreciative for the call, review, and appointment. She verbalized understanding of our conversation with no further questions at this time.

## 2021-04-21 NOTE — Telephone Encounter (Signed)
Patient c/o Palpitations:  High priority if patient c/o lightheadedness, shortness of breath, or chest pain  How long have you had palpitations/irregular HR/ Afib? Are you having the symptoms now? 2wks, yes  Are you currently experiencing lightheadedness, SOB or CP? palpitations  Do you have a history of afib (atrial fibrillation) or irregular heart rhythm? Yes afib  Have you checked your BP or HR? (document readings if available): 140/91, HR 68  Are you experiencing any other symptoms? Waking up lightheadedness after getting up for about an hour.

## 2021-04-22 ENCOUNTER — Encounter: Payer: Self-pay | Admitting: Cardiovascular Disease

## 2021-04-22 ENCOUNTER — Ambulatory Visit: Payer: BC Managed Care – PPO | Admitting: Cardiovascular Disease

## 2021-04-22 ENCOUNTER — Other Ambulatory Visit: Payer: Self-pay

## 2021-04-22 ENCOUNTER — Telehealth: Payer: Self-pay | Admitting: *Deleted

## 2021-04-22 DIAGNOSIS — I10 Essential (primary) hypertension: Secondary | ICD-10-CM | POA: Diagnosis not present

## 2021-04-22 DIAGNOSIS — E782 Mixed hyperlipidemia: Secondary | ICD-10-CM

## 2021-04-22 DIAGNOSIS — I480 Paroxysmal atrial fibrillation: Secondary | ICD-10-CM

## 2021-04-22 MED ORDER — MULTAQ 400 MG PO TABS: 400.0000 mg | ORAL_TABLET | Freq: Two times a day (BID) | ORAL | 5 refills | Status: DC

## 2021-04-22 NOTE — Progress Notes (Signed)
Cardiology Office Note  Date:  04/22/2021   ID:  Dudley, Heather 12-28-1960, MRN [id number]  PCP:  Glean Hess, MD   Chief Complaint  Patient presents with   Palpitations    Patient c/o shortness of breath with exertion and palpitations more frequently. Medications reviewed by the patient verbally.     HPI:  Heather Dudley is a 61 y.o. female  HTN Nonsmoker No diabetes breast cancer on the left, lumpectomy and lymph node dissection., chemotherapy 2015 remote chest pain Dx with OSA, on CPAP,  Presents for f/u of her chest pain, tachycardia, paroxysmal atrial fibrillation  Last seen in clinic January 2023 Had one week worsening palps on that visit  Today, continue  Several times a day with worsening palpitations Takes propranolol x 2 at a time to make her symptoms go away Some SOB  Eye itching on amlodipine Side effects on lisinopril Side effect on flecainide, "felt scattered"  Weight is up 13 pounds in the past year  Little bit of leg swelling No regular exercise program  Lab work reviewed A1C 5.9 Total chol 262 LDL 195  EKG personally reviewed by myself on todays visit Nsr rate 63 no significant ST-T wave changes  past medical history reviewed Prefered not to take antiarrhythmic on a regular basis  Seen in emergency room January 24, 2020 Had discoordination, anxiety, atypical chest pain Night before had episode of atrial fibrillation  Echo April 2021 Left ventricular ejection fraction, by estimation, is 55 to 60%. The  left ventricle has normal function Left ventricular diastolic parameters  are consistent with Grade II diastolic dysfunction (pseudonormalization).  moderately elevated pulmonary artery systolic pressure.   Monitor reviewed avg HR of 79 bpm. Predominant underlying rhythm was Sinus Rhythm. Atrial Fibrillation occurred (2% burden), ranging from 73-175 bpm (avg of 115 bpm), the longest lasting 52 mins 28 secs with an avg  rate of 118 bpm.  CT chest:  No significant coronary calcified atherosclerosis or aortic plaque  History of lumpectomy and lymph node dissection.  Sometimes this causes discomfort in her left arm  PMH:   has a past medical history of Basal cell carcinoma of forehead, Breast cancer ([place]), Breast screening, unspecified, Cellulitis and abscess of trunk, Diastolic dysfunction, Hernia, History of chemotherapy (2012), Hypertension, Lump or mass in breast, Malignant neoplasm of breast (female), unspecified site, Malignant neoplasm of upper-outer quadrant of female breast ([place]), Neoplasm of uncertain behavior of connective and other soft tissue, Obesity, unspecified, PAF (paroxysmal atrial fibrillation) ([place]) (05/04/2017), Personal history of chemotherapy, Personal history of malignant neoplasm of breast, Personal history of radiation therapy (2013), Screening for obesity, and Thyroid nodule.  PSH:    Past Surgical History:  Procedure Laterality Date   ABDOMINAL HYSTERECTOMY  2011   birth mark removed     BREAST CYST ASPIRATION Right    negative 01/2010   BREAST EXCISIONAL BIOPSY  August 05, 2010   left breast positive 07/2010   BREAST LUMPECTOMY  2012   left breast   COLONOSCOPY  July 29, 2012   normal study.   robotic tonsillectomy Lingual tonsils Bilateral 06/08/2016   THYROID SURGERY     partially removed   TUBAL LIGATION      Current Outpatient Medications  Medication Sig Dispense Refill   amLODipine (NORVASC) 5 MG tablet Take 1 tablet (5 mg total) by mouth daily. 90 tablet 3   apixaban (ELIQUIS) 5 MG TABS tablet Take 1 tablet (5 mg total) by mouth 2 (two) times  daily. 60 tablet 5   B Complex-Biotin-FA (B-COMPLEX PO) Take 1 tablet by mouth daily.     hydrochlorothiazide (MICROZIDE) 12.5 MG capsule Take 1 capsule (12.5 mg total) by mouth daily. 90 capsule 3   levothyroxine (SYNTHROID) 75 MCG tablet Take 75 mcg by mouth daily.     LORazepam (ATIVAN) 0.5 MG tablet TAKE 1 TABLET BY MOUTH  EVERY 8 HOURS AS NEEDED FOR ANXIETY. 20 tablet 0   metoprolol succinate (TOPROL-XL) 100 MG 24 hr tablet Take 1 tablet (100 mg total) by mouth daily. Take with or immediately following a meal. 90 tablet 3   Multiple Vitamin (MULTIVITAMIN) tablet Take 1 tablet by mouth daily.     propranolol (INDERAL) 20 MG tablet Take 1 tablet (20 mg total) by mouth 3 (three) times daily as needed.     No current facility-administered medications for this visit.     Allergies:   Ace inhibitors, Sernivo [betamethasone dipropionate], and Penicillins   Social History:  The patient  reports that she has never smoked. She has never used smokeless tobacco. She reports that she does not currently use alcohol. She reports that she does not use drugs.   Family History:   family history includes Cancer in her cousin, maternal uncle, mother, and paternal aunt; Colon cancer in an other family member; Heart failure in her father; Ovarian cancer in an other family member.    Review of Systems: Review of Systems  Constitutional: Negative.   HENT: Negative.    Respiratory: Negative.    Cardiovascular:  Positive for palpitations.  Gastrointestinal: Negative.   Musculoskeletal: Negative.   Neurological: Negative.   Psychiatric/Behavioral: Negative.    All other systems reviewed and are negative.  PHYSICAL EXAM: VS:  Ht 5\' 7"  (1.702 m)    Wt 266 lb 8 oz (120.9 kg)    LMP 04/01/2008    BMI 41.74 kg/m  , BMI Body mass index is 41.74 kg/m. Constitutional:  oriented to person, place, and time. No distress.  HENT:  Head: Grossly normal Eyes:  no discharge. No scleral icterus.  Neck: No JVD, no carotid bruits  Cardiovascular: Regular rate and rhythm, no murmurs appreciated Pulmonary/Chest: Clear to auscultation bilaterally, no wheezes or rails Abdominal: Soft.  no distension.  no tenderness.  Musculoskeletal: Normal range of motion Neurological:  normal muscle tone. Coordination normal. No atrophy Skin: Skin warm and  dry Psychiatric: normal affect, pleasant   Recent Labs: 07/12/2020: ALT 10; BUN 11; Creatinine, Ser 1.08; Hemoglobin 12.7; Platelets 320; Potassium 4.1; Sodium 137    Lipid Panel Lab Results  Component Value Date   CHOL 262 (H) 07/12/2020   HDL 46 07/12/2020   LDLCALC 195 (H) 07/12/2020   TRIG 118 07/12/2020     Wt Readings from Last 3 Encounters:  04/22/21 266 lb 8 oz (120.9 kg)  03/17/21 264 lb 8 oz (120 kg)  01/31/21 262 lb (118.8 kg)     ASSESSMENT AND PLAN:  Paroxysmal atrial fibrillation Long discussion concerning treatment options for atrial fibrillation Zio monitor was offered, she has declined at this time Recommended cardia mobile for monitoring arrhythmia On metoprolol with Eliquis still with breakthrough Does not want to retry flecainide secondary to side effects Recommend she try Multaq 400 twice daily Second option would be propafenone Before amiodarone/Tikosyn would have reviewed with the EP to discuss possible ablation  Chest pain, unspecified type - CT scan chest reviewed no coronary calcification, no aortic atherosclerosis CT scan 2020 reviewed  no significant coronary calcification  or aortic atherosclerosis Lifestyle modification recommended  Hyperlipidemia, mixed She prefers no medication at this time We have previously discussed statins, Zetia, even injections Lifestyle modification recommended, discussed diet, walking program  Essential hypertension -  Hold amlodipine given potential side effects that she discussed Monitor blood pressure BMP today as she is on HCTZ Has not been faithfully taking potassium, has had hypokalemia before  Malignant neoplasm of left breast in female, estrogen receptor positive, unspecified site of breast ([place]) Previous chemotherapy and radiation on the left Lymphedema left arm Normal ejection fraction 2021 discussed   Total encounter time more than 40 minutes  Greater than 50% was spent in counseling and  coordination of care with the patient   No orders of the defined types were placed in this encounter.    Signed, Esmond Plants, M.D., Ph.D. 04/22/2021  [place], [place]

## 2021-04-22 NOTE — Patient Instructions (Addendum)
Medication Instructions:  Please start multaq 400 mg twice a day  Hold amlodipine  If you need a refill on your cardiac medications before your next appointment, please call your pharmacy.   Lab work: [employer]  Testing/Procedures: No new testing needed  Follow-Up: At [facility], you and your health needs are our priority.  As part of our continuing mission to provide you with exceptional heart care, we have created designated Provider Care Teams.  These Care Teams include your primary Cardiologist (physician) and Advanced Practice Providers (APPs -  Physician Assistants and Nurse Practitioners) who all work together to provide you with the care you need, when you need it.  You will need a follow up appointment in 1-2 months  Providers on your designated Care Team:   Murray Hodgkins, NP Christell Faith, PA-C Cadence Kathlen Mody, [place]  COVID-19 Vaccine Information can be found at: [email or web address].co.uk For questions related to vaccine distribution or appointments, please email vaccine@[email or web address] .[email or web address] or call [phone].   Medication Samples have been provided to the patient.  Drug name: Multaq        Strength: 400 mg         Qty: 3 boxes   LOT: [id number]   Exp.Date: 5/25  Dosing instructions: Take 1 tablet by mouth twice a day

## 2021-04-22 NOTE — Telephone Encounter (Signed)
Pt requiring PA for multaq 400 mg tablet. Prior authorization has been authorized pending RN reviewed due to [place] not being on pt's recommended formulary.  Please advise for PA in covermymeds pending for suggestion below.  The patient's drug benefit plan provides coverage for other drugs which may be considered for treating your patient. Can your patient's treatment be switched to a formulary drug? [If yes, provide your patient with a new prescription for the formulary product.] Available Formulary Alternatives: amiodarone  YES  NO

## 2021-04-23 LAB — BASIC METABOLIC PANEL
BUN/Creatinine Ratio: 14 (ref 9–23)
BUN: 15 mg/dL (ref 6–24)
CO2: 24 mmol/L (ref 20–29)
Calcium: 9.9 mg/dL (ref 8.7–10.2)
Chloride: 102 mmol/L (ref 96–106)
Creatinine, Ser: 1.07 mg/dL — ABNORMAL HIGH (ref 0.57–1.00)
Glucose: 107 mg/dL — ABNORMAL HIGH (ref 70–99)
Potassium: 4.1 mmol/L (ref 3.5–5.2)
Sodium: 137 mmol/L (ref 134–144)
eGFR: 60 mL/min/{1.73_m2} (ref >59)

## 2021-04-23 NOTE — Telephone Encounter (Signed)
Would need to provide documentation per MD on why amiodarone cannot be used before Multaq will be covered by her insurance. ?

## 2021-04-27 ENCOUNTER — Other Ambulatory Visit: Payer: Self-pay | Admitting: Cardiovascular Disease

## 2021-04-28 MED ORDER — PROPAFENONE HCL ER 225 MG PO CP12: 225.0000 mg | ORAL_CAPSULE | Freq: Two times a day (BID) | ORAL | 3 refills | Status: DC

## 2021-04-28 NOTE — Telephone Encounter (Signed)
Was able to reach out to Heather Dudley, advised on PA denied for Multaq, so Dr. Rockey Situ has another med to try in it's place. ? ?Would recommend she try the propafenone SR 225 mg twice a day  ?We will stay on the beta-blocker  ?Thx  ?TG  ? ?Patient verbalized understanding, all questions were address and no additional concerns at this time. Heather Dudley thankful for the return call and advice. Agreeable to plan on switching medications d/t insurance, will call back for anything further.   ? ?Med sent to [facility], med list updated ? ?

## 2021-04-30 ENCOUNTER — Ambulatory Visit: Payer: BC Managed Care – PPO | Admitting: Cardiovascular Disease

## 2021-05-06 ENCOUNTER — Other Ambulatory Visit: Payer: Self-pay | Admitting: *Deleted

## 2021-05-06 DIAGNOSIS — Z853 Personal history of malignant neoplasm of breast: Secondary | ICD-10-CM

## 2021-05-08 ENCOUNTER — Other Ambulatory Visit: Payer: BC Managed Care – PPO

## 2021-05-08 ENCOUNTER — Ambulatory Visit: Payer: BC Managed Care – PPO | Admitting: Oncology

## 2021-05-13 ENCOUNTER — Inpatient Hospital Stay: Payer: BC Managed Care – PPO | Attending: Oncology

## 2021-05-13 ENCOUNTER — Other Ambulatory Visit: Payer: Self-pay

## 2021-05-13 ENCOUNTER — Inpatient Hospital Stay: Payer: BC Managed Care – PPO | Admitting: Oncology

## 2021-05-13 ENCOUNTER — Encounter: Payer: Self-pay | Admitting: Oncology

## 2021-05-13 VITALS — BP 163/91 | HR 105 | Temp 97.6°F | Wt 266.0 lb

## 2021-05-13 DIAGNOSIS — K921 Melena: Secondary | ICD-10-CM | POA: Insufficient documentation

## 2021-05-13 DIAGNOSIS — I480 Paroxysmal atrial fibrillation: Secondary | ICD-10-CM | POA: Insufficient documentation

## 2021-05-13 DIAGNOSIS — Z7901 Long term (current) use of anticoagulants: Secondary | ICD-10-CM | POA: Diagnosis not present

## 2021-05-13 DIAGNOSIS — Z853 Personal history of malignant neoplasm of breast: Secondary | ICD-10-CM | POA: Insufficient documentation

## 2021-05-13 DIAGNOSIS — Z9221 Personal history of antineoplastic chemotherapy: Secondary | ICD-10-CM | POA: Diagnosis not present

## 2021-05-13 DIAGNOSIS — Z923 Personal history of irradiation: Secondary | ICD-10-CM | POA: Insufficient documentation

## 2021-05-13 HISTORY — DX: Melena: K92.1

## 2021-05-13 LAB — CBC WITH DIFFERENTIAL/PLATELET
Abs Immature Granulocytes: 0.02 10*3/uL (ref 0.00–0.07)
Basophils Absolute: 0.1 10*3/uL (ref 0.0–0.1)
Basophils Relative: 1 %
Eosinophils Absolute: 0.3 10*3/uL (ref 0.0–0.5)
Eosinophils Relative: 3 %
HCT: 39.8 % (ref 36.0–46.0)
Hemoglobin: 12.8 g/dL (ref 12.0–15.0)
Immature Granulocytes: 0 %
Lymphocytes Relative: 36 %
Lymphs Abs: 3.3 10*3/uL (ref 0.7–4.0)
MCH: 28.2 pg (ref 26.0–34.0)
MCHC: 32.2 g/dL (ref 30.0–36.0)
MCV: 87.7 fL (ref 80.0–100.0)
Monocytes Absolute: 0.5 10*3/uL (ref 0.1–1.0)
Monocytes Relative: 6 %
Neutro Abs: 5 10*3/uL (ref 1.7–7.7)
Neutrophils Relative %: 54 %
Platelets: 289 10*3/uL (ref 150–400)
RBC: 4.54 MIL/uL (ref 3.87–5.11)
RDW: 13.8 % (ref 11.5–15.5)
WBC: 9.2 10*3/uL (ref 4.0–10.5)
nRBC: 0 % (ref 0.0–0.2)

## 2021-05-13 LAB — COMPREHENSIVE METABOLIC PANEL
ALT: 14 U/L (ref 0–44)
AST: 20 U/L (ref 15–41)
Albumin: 4.1 g/dL (ref 3.5–5.0)
Alkaline Phosphatase: 72 U/L (ref 38–126)
Anion gap: 9 (ref 5–15)
BUN: 16 mg/dL (ref 6–20)
CO2: 28 mmol/L (ref 22–32)
Calcium: 9.1 mg/dL (ref 8.9–10.3)
Chloride: 98 mmol/L (ref 98–111)
Creatinine, Ser: 1.06 mg/dL — ABNORMAL HIGH (ref 0.44–1.00)
GFR, Estimated: >60 mL/min (ref >60)
Glucose, Bld: 96 mg/dL (ref 70–99)
Potassium: 3.7 mmol/L (ref 3.5–5.1)
Sodium: 135 mmol/L (ref 135–145)
Total Bilirubin: 0.6 mg/dL (ref 0.3–1.2)
Total Protein: 7.7 g/dL (ref 6.5–8.1)

## 2021-05-13 NOTE — Progress Notes (Signed)
?Hematology/Oncology Progress note ?[phone] [phone] ?  ? ? ?Clinic day:  05/13/2021  ? ?Chief Complaint: Heather Dudley is a 61 y.o. female with stage IIA left breast cancer who is seen for 1 year assessment. ? ?HPI:  ?Patient previously followed up by Dr.Corcoran, patient switched care to me on 05/13/21 ?Extensive medical record review was performed by me ? ?09/02/2020 patient was diagnosed with stage IIa left breast invasive ductal carcinoma status post wide excision and axillary lymph node dissection.  One of the 14 lymph nodes were positive for macro metastasis.  Pathological stage was T1cN1bM0. ?ER 90% positive, PR 20% positive, HER2/nu negative by FISH. ?Patient was involved on NSABP B47 clinical trial.  Received AC x4 followed by weekly Taxol and she completed adjuvant chemotherapy on 02/06/2011.  Received adjuvant breast radiation in February 2013.  Patient received 1 year of adjuvant Herceptin, finished 11/13/2011.  Patient was started on adjuvant endocrine therapy with Arimidex,from 07/2011 - 07/2015 and Femara from 09/04/2015 - 10/09/2015.  She stopped Arimidex and Femara secondary to vasomotor symptoms.  She began Aromasin on 10/09/2015 (discontinued in 11/2015 or 12/2015). ?Breast cancer index (BCI) testing on 10/08/2015 revealed a high risk of late recurrence  Risk is estimated at 12.7% (CI: 7.4%-17.7%) during years 5-10.   She has a high likelihood of benefit from extended adjuvant hormonal therapy. ? ?Bilateral diagnostic mammogram on 07/27/2019 revealed enlarged RIGHT axillary lymph nodes, likely related to recent Covid-19 vaccine. There was a minimally complicated cyst in the 4:62 o'clock location of the RIGHT breast, warranting follow-up. There were post treatment changes in the LEFT breast which were otherwise normal. Recommended RIGHT axillary ultrasound in 3 months to assess for resolution of RIGHT axillary adenopathy. If nodes are persistently enlarged, consider  ultrasound-guided core biopsy. Recommended RIGHT breast ultrasound in 6 months to assess stability of minimally complicated cyst in the 8:63 o'clock location.   ?Right breast ultrasound on 11/27/2019 revealed persistently thickened right axillary lymph nodes.  ? ?Right axillary lymph node biopsy on 12/04/2019 was negative for malignancy. ? ?CA27.29 has been followed: 17.4 on 11/02/2013, 21 on 12/20/2014, 19.5 on 09/04/2015, 21.7 on 10/09/2015, 17.6 on 02/05/2016, 18.6 on 04/14/2017, 17.6 on 04/27/2018, 20.2 on 05/01/2019, and 23.6 on 05/06/2020. ? ?Abdomen and pelvic CT on 04/05/2017 revealed a 2.4 cm hypodensity over the right lobe of the liver unchanged in size from 2013 and compatible with a hemangioma.  There were a ffew small liver cysts were described.   ? ?Bone density on 07/27/2019 was normal with a T score of -0.3 at the left femur neck.  She has vasomotor symptoms well managed on Effexor. ? ?She underwent bilateral tonsillectomy on 06/08/2016 at [facility] for obstructive sleep apnea. ? ?She has microscopic hematuria.  Urinalysis on 07/01/2017 and 04/08/2018 revealed large RBCs.  She denies any gross hematuria.  She has no history of nephrolithiasis. ? ?Patient has paroxysmal atrial fibrillation and patient is on beta-blocker and anticoagulation Eliquis.  She follows up with cardiology. ? ?Today patient reports feeling well.  She denies any new complaints, denies any breast concerns. ?Mammogram has been ordered by primary care provider Dr. Army Melia. ? ?Patient has noticed intermittent blood in the stool.  Last colonoscopy was a about 10 years ago.  She has no pain, unintentional weight loss, difficulty passing bowel movement. ?She attributes to possible hemorrhoids. ? ? ?Past Medical History:  ?Diagnosis Date  ? Basal cell carcinoma of forehead   ? birthmark of forehead  ? Breast cancer ([place])   ?  Breast screening, unspecified   ? Cellulitis and abscess of trunk   ? Diastolic dysfunction   ? a. 05/2019 Echo: EF  55-60%, no rwma, mild LVH, Gr2 DD, Nl RV size/fxn. Triv MR.  ? Hernia   ? History of chemotherapy 2012  ? Hypertension   ? Lump or mass in breast   ? Malignant neoplasm of breast (female), unspecified site   ? She underwent wide excision, mastoplasty and axillary dissection for her left breast malignancy on September 03, 2010. The primary tumor was 1 cm in diameter, and a single positive axillary node 1.3 cm in diameter. This was an ER-positive, PR slightly positive, HER-2/neu not overexpressing tumor. She tolerated her whole breast radiation without difficulty ending in late February 2013.  ? Malignant neoplasm of upper-outer quadrant of female breast ([place])   ? Neoplasm of uncertain behavior of connective and other soft tissue   ? Obesity, unspecified   ? PAF (paroxysmal atrial fibrillation) ([place]) 05/04/2017  ? a. 04/2019 Zio: RSR, avg HR 79. 2% PAF burden (73-175, avg 115; longest 31mins 28secs). CHA2DS2VASc = 2-->Xarelto.  ? Personal history of chemotherapy   ? Personal history of malignant neoplasm of breast   ? The patient underwent wide excision, mastoplasty. Dissection for a T1b, N1, M0 carcinoma left breast on September 03, 2010.The primary tumor was 1 cm in diameter, and a single positive axillary node 1.3 cm in diameter. This was an ER-positive, PR slightly positive, HER-2/neu not overexpressing tumor.  The patient completed whole breast radiation in February 2013.  ? Personal history of radiation therapy 2013  ? LEFT lumpectomy  ? Screening for obesity   ? Thyroid nodule   ? ? ?Past Surgical History:  ?Procedure Laterality Date  ? ABDOMINAL HYSTERECTOMY  2011  ? birth mark removed    ? BREAST CYST ASPIRATION Right   ? negative 01/2010  ? BREAST EXCISIONAL BIOPSY  August 05, 2010  ? left breast positive 07/2010  ? BREAST LUMPECTOMY  2012  ? left breast  ? COLONOSCOPY  July 29, 2012  ? normal study.  ? robotic tonsillectomy Lingual tonsils Bilateral 06/08/2016  ? THYROID SURGERY    ? partially removed  ? TUBAL LIGATION     ? ? ?Family History  ?Problem Relation Age of Onset  ? Colon cancer Other   ? Ovarian cancer Other   ? Cancer Mother   ?     lung ca  ? Cancer Maternal Uncle   ?     lung ca  ? Cancer Paternal Aunt   ?     ovarian ca  ? Cancer Cousin   ?     female ca  ? Heart failure Father   ? Breast cancer Neg Hx   ? ? ?Social History:  reports that she has never smoked. She has never used smokeless tobacco. She reports that she does not currently use alcohol. She reports that she does not use drugs.  She has 3 sons (ages 71, 33, and 32).  One son lives at home and is a Research scientist (medical).  She is from [place].  She lives in [place].  The patient is alone today. ? ?Allergies:  ?Allergies  ?Allergen Reactions  ? Ace Inhibitors Swelling  ? Sernivo [Betamethasone Dipropionate] Itching  ? Penicillins Rash  ?  Has patient had a PCN reaction causing immediate rash, facial/tongue/throat swelling, SOB or lightheadedness with hypotension: Yes ?Has patient had a PCN reaction causing severe rash involving mucus membranes or skin  necrosis: No ?Has patient had a PCN reaction that required hospitalization: No ?Has patient had a PCN reaction occurring within the last 10 years: No ?If all of the above answers are "NO", then may proceed with Cephalosporin use.  ? ? ?Current Medications: ?Current Outpatient Medications  ?Medication Sig Dispense Refill  ? apixaban (ELIQUIS) 5 MG TABS tablet Take 1 tablet (5 mg total) by mouth 2 (two) times daily. 60 tablet 5  ? B Complex-Biotin-FA (B-COMPLEX PO) Take 1 tablet by mouth daily.    ? hydrochlorothiazide (MICROZIDE) 12.5 MG capsule Take 1 capsule (12.5 mg total) by mouth daily. 90 capsule 3  ? levothyroxine (SYNTHROID) 75 MCG tablet Take 75 mcg by mouth daily.    ? LORazepam (ATIVAN) 0.5 MG tablet TAKE 1 TABLET BY MOUTH EVERY 8 HOURS AS NEEDED FOR ANXIETY. 20 tablet 0  ? metoprolol succinate (TOPROL-XL) 100 MG 24 hr tablet Take 1 tablet (100 mg total) by mouth daily. Take with or immediately following a  meal. 90 tablet 3  ? Multiple Vitamin (MULTIVITAMIN) tablet Take 1 tablet by mouth daily.    ? propafenone (RYTHMOL SR) 225 MG 12 hr capsule Take 1 capsule (225 mg total) by mouth 2 (two) times daily.

## 2021-05-13 NOTE — Progress Notes (Signed)
Patient here for follow up. Patient states she had bright red blood in stool twice last week. Patient conforms she does have hemorrhoids. ?

## 2021-05-14 LAB — CANCER ANTIGEN 27.29: CA 27.29: 18.8 U/mL (ref 0.0–38.6)

## 2021-05-26 ENCOUNTER — Encounter: Payer: Self-pay | Admitting: Cardiovascular Disease

## 2021-05-26 ENCOUNTER — Ambulatory Visit: Payer: BC Managed Care – PPO | Admitting: Cardiovascular Disease

## 2021-05-26 VITALS — BP 130/80 | HR 63 | Ht 67.0 in | Wt 267.2 lb

## 2021-05-26 DIAGNOSIS — E782 Mixed hyperlipidemia: Secondary | ICD-10-CM

## 2021-05-26 DIAGNOSIS — I480 Paroxysmal atrial fibrillation: Secondary | ICD-10-CM

## 2021-05-26 MED ORDER — HYDROCHLOROTHIAZIDE 25 MG PO TABS: 25.0000 mg | ORAL_TABLET | Freq: Every day | ORAL | 3 refills | Status: DC

## 2021-05-26 MED ORDER — POTASSIUM CHLORIDE CRYS ER 20 MEQ PO TBCR: 20.0000 meq | EXTENDED_RELEASE_TABLET | Freq: Every day | ORAL | 3 refills | Status: DC

## 2021-05-26 NOTE — Progress Notes (Signed)
Cardiology Office Note ? ?Date:  05/26/2021  ? ?ID:  Heather Dudley, DOB 03/05/1960, MRN [id number] ? ?PCP:  Glean Hess, MD  ? ?Chief Complaint  ?Patient presents with  ? 2 month follow up   ?  "Doing well." Medications reviewed by the patient verbally.   ? ? ?HPI:  ?Heather Dudley is a 61 y.o. female  ?HTN ?Nonsmoker ?No diabetes ?breast cancer on the left, lumpectomy and lymph node dissection., chemotherapy 2015 ?remote chest pain ?Dx with OSA, on CPAP,  ?Presents for f/u of her chest pain, tachycardia, paroxysmal atrial fibrillation ? ?Last seen in clinic April 22, 2021 ?Was having more breakthrough tachyarrhythmias consistent with paroxysmal atrial fibrillation ?Started on Multaq 400 twice daily for A-fib, too expensive, did not get filled ? ?Changed script to propafenone SR 225 twice daily ?On today's visit she reports that she is only taking it once a day, makes her tired ?Also on metoprolol succinate 100 daily ? ?Not much atrial fib ?Sleeping ok ?Not much propranolol use for breakthrough tachyarrhythmia ?No significant leg swelling ? ?Back on potassium ?Full dose HCTZ 25 ?Blood pressure stable ? ?Lab work reviewed ?A1C 5.9 ?Total chol 262 ?LDL 195 ? ?EKG personally reviewed by myself on todays visit ?Normal sinus rhythm rate 63 bpm no significant ST-T wave changes ? ?Of past medical history reviewed ?Eye itching on amlodipine ?Side effects on lisinopril ?Side effect on flecainide, "felt scattered" ? ?Seen in emergency room January 24, 2020 ?Had discoordination, anxiety, atypical chest pain ?Night before had episode of atrial fibrillation ? ?Echo April 2021 ?Left ventricular ejection fraction, by estimation, is 55 to 60%. The  left ventricle has normal function ?Left ventricular diastolic parameters  are consistent with Grade II diastolic dysfunction (pseudonormalization).  ?moderately elevated pulmonary artery systolic pressure.  ? ?Monitor reviewed ?avg HR of 79 bpm. Predominant underlying  rhythm was Sinus Rhythm. Atrial Fibrillation occurred (2% burden), ranging from 73-175 bpm (avg of 115 bpm), the longest lasting 52 mins 28 secs with an avg rate of 118 bpm. ? ?CT chest:  ?No significant coronary calcified atherosclerosis or aortic plaque ? ?History of lumpectomy and lymph node dissection.  ?Sometimes this causes discomfort in her left arm ? ?PMH:   has a past medical history of Basal cell carcinoma of forehead, Breast cancer ([place]), Breast screening, unspecified, Cellulitis and abscess of trunk, Diastolic dysfunction, Hernia, History of chemotherapy (2012), Hypertension, Lump or mass in breast, Malignant neoplasm of breast (female), unspecified site, Malignant neoplasm of upper-outer quadrant of female breast ([place]), Neoplasm of uncertain behavior of connective and other soft tissue, Obesity, unspecified, PAF (paroxysmal atrial fibrillation) ([place]) (05/04/2017), Personal history of chemotherapy, Personal history of malignant neoplasm of breast, Personal history of radiation therapy (2013), Screening for obesity, and Thyroid nodule. ? ?PSH:    ?Past Surgical History:  ?Procedure Laterality Date  ? ABDOMINAL HYSTERECTOMY  2011  ? birth mark removed    ? BREAST CYST ASPIRATION Right   ? negative 01/2010  ? BREAST EXCISIONAL BIOPSY  August 05, 2010  ? left breast positive 07/2010  ? BREAST LUMPECTOMY  2012  ? left breast  ? COLONOSCOPY  July 29, 2012  ? normal study.  ? robotic tonsillectomy Lingual tonsils Bilateral 06/08/2016  ? THYROID SURGERY    ? partially removed  ? TUBAL LIGATION    ? ? ?Current Outpatient Medications  ?Medication Sig Dispense Refill  ? apixaban (ELIQUIS) 5 MG TABS tablet Take 1 tablet (5 mg total) by mouth 2 (  two) times daily. 60 tablet 5  ? B Complex-Biotin-FA (B-COMPLEX PO) Take 1 tablet by mouth daily.    ? hydrochlorothiazide (HYDRODIURIL) 25 MG tablet Take 25 mg by mouth daily.    ? levothyroxine (SYNTHROID) 75 MCG tablet Take 75 mcg by mouth daily.    ? LORazepam (ATIVAN) 0.5 MG  tablet TAKE 1 TABLET BY MOUTH EVERY 8 HOURS AS NEEDED FOR ANXIETY. 20 tablet 0  ? metoprolol succinate (TOPROL-XL) 100 MG 24 hr tablet Take 1 tablet (100 mg total) by mouth daily. Take with or immediately following a meal. 90 tablet 3  ? Multiple Vitamin (MULTIVITAMIN) tablet Take 1 tablet by mouth daily.    ? potassium chloride SA (KLOR-CON M) 20 MEQ tablet Take 20 mEq by mouth daily.    ? propafenone (RYTHMOL SR) 225 MG 12 hr capsule Take 225 mg by mouth daily.    ? propranolol (INDERAL) 20 MG tablet Take 1 tablet (20 mg total) by mouth 3 (three) times daily as needed.    ? ?No current facility-administered medications for this visit.  ? ? ? ?Allergies:   Ace inhibitors, Sernivo [betamethasone dipropionate], and Penicillins  ? ?Social History:  The patient  reports that she has never smoked. She has never used smokeless tobacco. She reports that she does not currently use alcohol. She reports that she does not use drugs.  ? ?Family History:   family history includes Cancer in her cousin, maternal uncle, mother, and paternal aunt; Colon cancer in an other family member; Heart failure in her father; Ovarian cancer in an other family member.  ? ? ?Review of Systems: ?Review of Systems  ?Constitutional: Negative.   ?HENT: Negative.    ?Respiratory: Negative.    ?Cardiovascular: Negative.   ?Gastrointestinal: Negative.   ?Musculoskeletal: Negative.   ?Neurological: Negative.   ?Psychiatric/Behavioral: Negative.    ?All other systems reviewed and are negative. ? ?PHYSICAL EXAM: ?VS:  BP 130/80 (BP Location: Right Arm, Patient Position: Sitting, Cuff Size: Large)   Pulse 63   Ht '5\' 7"'$  (1.702 m)   Wt 121.2 kg   LMP 04/01/2008   SpO2 98%   BMI 41.86 kg/m?  , BMI Body mass index is 41.86 kg/m?[name] ?Constitutional:  oriented to person, place, and time. No distress.  ?HENT:  ?Head: Grossly normal ?Eyes:  no discharge. No scleral icterus.  ?Neck: No JVD, no carotid bruits  ?Cardiovascular: Regular rate and rhythm, no  murmurs appreciated ?Pulmonary/Chest: Clear to auscultation bilaterally, no wheezes or rails ?Abdominal: Soft.  no distension.  no tenderness.  ?Musculoskeletal: Normal range of motion ?Neurological:  normal muscle tone. Coordination normal. No atrophy ?Skin: Skin warm and dry ?Psychiatric: normal affect, pleasant ? ?Recent Labs: ?05/13/2021: ALT 14; BUN 16; Creatinine, Ser 1.06; Hemoglobin 12.8; Platelets 289; Potassium 3.7; Sodium 135  ? ? ?Lipid Panel ?Lab Results  ?Component Value Date  ? CHOL 262 (H) 07/12/2020  ? HDL 46 07/12/2020  ? LDLCALC 195 (H) 07/12/2020  ? TRIG 118 07/12/2020  ? ? ? ?Wt Readings from Last 3 Encounters:  ?05/26/21 121.2 kg  ?05/13/21 120.7 kg  ?04/22/21 120.9 kg  ?  ? ?ASSESSMENT AND PLAN: ? ?Paroxysmal atrial fibrillation ?Unable to afford Multaq ?Did not want to retry flecainide secondary to side effects ?Was having breakthrough A-fib on metoprolol ?Was started on propafenone SR 225 twice daily, reports she is only taking this once a day ?Has some improvement in her symptoms, not requiring propranolol for breakthrough ?Recommend we continue propafenone as above.  Extra dose for twice daily dosing for worsening symptoms ?We will try to avoid amiodarone and Tikosyn for now ?We did discuss ablation if worsening symptoms ? ?Chest pain, unspecified type - ?CT scan chest reviewed no coronary calcification, no aortic atherosclerosis ?CT scan 2020 reviewed  no significant coronary calcification or aortic atherosclerosis ?No further work-up ?Walking program for conditioning ? ?Hyperlipidemia, mixed ?She prefers no medication at this time ?We have previously discussed statins, Zetia, even injections ?Recommend walking program for weight loss ? ?Essential hypertension -  ?Blood pressure stable on current regimen ?Refill given for HCTZ and potassium ? ?Malignant neoplasm of left breast in female, estrogen receptor positive, unspecified site of breast ([place]) ?Previous chemotherapy and radiation on  the left ?Lymphedema left arm ?Normal ejection fraction 2021 discussed ? ? Total encounter time more than 30 minutes ? Greater than 50% was spent in counseling and coordination of care with the patient ? ? ?No ord

## 2021-05-26 NOTE — Patient Instructions (Addendum)
Medication Instructions: Your physician recommends that you continue on your current medications as directed. Please refer to the Current Medication list given to you today.    If you need a refill on your cardiac medications before your next appointment, please call your pharmacy.   Lab work: No new labs needed  Testing/Procedures: No new testing needed  Follow-Up: At [facility], you and your health needs are our priority.  As part of our continuing mission to provide you with exceptional heart care, we have created designated Provider Care Teams.  These Care Teams include your primary Cardiologist (physician) and Advanced Practice Providers (APPs -  Physician Assistants and Nurse Practitioners) who all work together to provide you with the care you need, when you need it.  You will need a follow up appointment in 6 months  Providers on your designated Care Team:   Christopher Berge, NP Ryan Dunn, PA-C Cadence Furth, PA-C  COVID-19 Vaccine Information can be found at: [email or web address] For questions related to vaccine distribution or appointments, please email [email or web address] or call [phone].   

## 2021-07-15 ENCOUNTER — Ambulatory Visit (INDEPENDENT_AMBULATORY_CARE_PROVIDER_SITE_OTHER): Payer: BC Managed Care – PPO | Admitting: Internal Medicine

## 2021-07-15 ENCOUNTER — Encounter: Payer: Self-pay | Admitting: Internal Medicine

## 2021-07-15 VITALS — BP 132/84 | HR 92 | Ht 67.0 in | Wt 267.0 lb

## 2021-07-15 DIAGNOSIS — Z131 Encounter for screening for diabetes mellitus: Secondary | ICD-10-CM | POA: Diagnosis not present

## 2021-07-15 DIAGNOSIS — E042 Nontoxic multinodular goiter: Secondary | ICD-10-CM | POA: Diagnosis not present

## 2021-07-15 DIAGNOSIS — I480 Paroxysmal atrial fibrillation: Secondary | ICD-10-CM

## 2021-07-15 DIAGNOSIS — E782 Mixed hyperlipidemia: Secondary | ICD-10-CM | POA: Diagnosis not present

## 2021-07-15 DIAGNOSIS — R102 Pelvic and perineal pain: Secondary | ICD-10-CM | POA: Diagnosis not present

## 2021-07-15 DIAGNOSIS — Z853 Personal history of malignant neoplasm of breast: Secondary | ICD-10-CM | POA: Diagnosis not present

## 2021-07-15 DIAGNOSIS — I10 Essential (primary) hypertension: Secondary | ICD-10-CM

## 2021-07-15 DIAGNOSIS — D6869 Other thrombophilia: Secondary | ICD-10-CM

## 2021-07-15 DIAGNOSIS — Z0000 Encounter for general adult medical examination without abnormal findings: Secondary | ICD-10-CM | POA: Diagnosis not present

## 2021-07-15 LAB — POCT URINALYSIS DIPSTICK
Bilirubin, UA: NEGATIVE
Glucose, UA: NEGATIVE
Ketones, UA: NEGATIVE
Leukocytes, UA: NEGATIVE
Nitrite, UA: NEGATIVE
Protein, UA: POSITIVE — AB
Spec Grav, UA: 1.01 (ref 1.010–1.025)
Urobilinogen, UA: 0.2 E.U./dL
pH, UA: 6.5 (ref 5.0–8.0)

## 2021-07-15 NOTE — Progress Notes (Signed)
Date:  07/15/2021   Name:  Heather Dudley   DOB:  March 04, 1960   MRN:  [id number]   Chief Complaint: Annual Exam (Breast exam no pap ) Heather Dudley is a 61 y.o. female who presents today for her Complete Annual Exam. She feels well. She reports exercising walking X 2-3 days a week for 20 mins.. She reports she is sleeping well. Breast complaints none.  Mammogram: 07/2020 DEXA: none Pap smear: discontinued Colonoscopy: 06/2019 repeat 2024  There are no preventive care reminders to display for this patient.   Immunization History  Administered Date(s) Administered   PFIZER(Purple Top)SARS-COV-2 Vaccination 06/24/2019, 07/14/2019   Zoster Recombinat (Shingrix) 12/20/2020, 03/28/2021    Hypertension This is a chronic problem. The problem is controlled. Pertinent negatives include no chest pain, headaches, palpitations or shortness of breath. Past treatments include beta blockers and diuretics. There are no compliance problems.  There is no history of kidney disease, CAD/MI or CVA. Identifiable causes of hypertension include a thyroid problem.  Thyroid Problem Presents for follow-up visit. Patient reports no anxiety, constipation, diarrhea, fatigue, palpitations or tremors. The symptoms have been stable.  Pelvic Pain The patient's primary symptoms include pelvic pain. The patient's pertinent negatives include no vaginal discharge. This is a new problem. The current episode started more than 1 month ago. The problem occurs constantly. The problem has been unchanged. The pain is moderate. The problem affects the right side. She is not pregnant. Pertinent negatives include no abdominal pain, chills, constipation, diarrhea, dysuria, fever, frequency, headaches, rash or vomiting. Nothing aggravates the symptoms.  Parox Afib - on beta blocker and Eliquis.  Started on Propafenone this year and still has some episodes.  Lab Results  Component Value Date   NA 135 05/13/2021   K 3.7  05/13/2021   CO2 28 05/13/2021   GLUCOSE 96 05/13/2021   BUN 16 05/13/2021   CREATININE 1.06 (H) 05/13/2021   CALCIUM 9.1 05/13/2021   EGFR 60 04/22/2021   GFRNONAA >60 05/13/2021   Lab Results  Component Value Date   CHOL 262 (H) 07/12/2020   HDL 46 07/12/2020   LDLCALC 195 (H) 07/12/2020   TRIG 118 07/12/2020   CHOLHDL 5.7 (H) 07/12/2020   Lab Results  Component Value Date   TSH 2.060 05/23/2019   Lab Results  Component Value Date   HGBA1C 5.9 (H) 07/12/2020   Lab Results  Component Value Date   WBC 9.2 05/13/2021   HGB 12.8 05/13/2021   HCT 39.8 05/13/2021   MCV 87.7 05/13/2021   PLT 289 05/13/2021   Lab Results  Component Value Date   ALT 14 05/13/2021   AST 20 05/13/2021   ALKPHOS 72 05/13/2021   BILITOT 0.6 05/13/2021   Lab Results  Component Value Date   VD25OH 20.0 (L) 07/01/2016     Review of Systems  Constitutional:  Negative for chills, fatigue and fever.  HENT:  Negative for congestion, hearing loss, tinnitus, trouble swallowing and voice change.   Eyes:  Negative for visual disturbance.  Respiratory:  Negative for cough, chest tightness, shortness of breath and wheezing.   Cardiovascular:  Negative for chest pain, palpitations and leg swelling.  Gastrointestinal:  Negative for abdominal pain, constipation, diarrhea and vomiting.  Endocrine: Negative for polydipsia and polyuria.  Genitourinary:  Positive for pelvic pain. Negative for dysuria, frequency, genital sores, vaginal bleeding and vaginal discharge.  Musculoskeletal:  Negative for arthralgias, gait problem and joint swelling.  Skin:  Negative for color  change and rash.  Neurological:  Negative for dizziness, tremors, light-headedness and headaches.  Hematological:  Negative for adenopathy. Does not bruise/bleed easily.  Psychiatric/Behavioral:  Negative for dysphoric mood and sleep disturbance. The patient is not nervous/anxious.    Patient Active Problem List   Diagnosis Date Noted    Blood in stool 05/13/2021   Peroneal tendinitis, left 09/30/2020   OSA on CPAP 07/12/2020   Acquired thrombophilia ([place]) 07/11/2020   Breast cyst, right 05/08/2020   Status post hysterectomy 04/03/2020   Colon cancer screening 07/13/2019   Paroxysmal atrial fibrillation ([place]) 07/11/2019   Multiple thyroid nodules 08/23/2018   Mixed hyperlipidemia 07/06/2018   Microscopic hematuria 05/04/2018   Liver hemangioma 03/26/2017   Panic disorder 06/15/2016   Macroglossia 02/05/2016   Environmental and seasonal allergies 01/02/2016   Hot flashes related to aromatase inhibitor therapy 09/08/2015   Vitamin D deficiency 04/19/2015   Cervical radiculopathy 04/17/2015   Essential hypertension 04/17/2015   History of partial thyroidectomy 04/17/2015   Obesity, Class II, BMI 35-39.9 04/17/2015   Lipoma of axilla 10/24/2014   History of left breast cancer 09/02/2010    Allergies  Allergen Reactions   Ace Inhibitors Swelling   Sernivo [Betamethasone Dipropionate] Itching   Penicillins Rash    Has patient had a PCN reaction causing immediate rash, facial/tongue/throat swelling, SOB or lightheadedness with hypotension: Yes Has patient had a PCN reaction causing severe rash involving mucus membranes or skin necrosis: No Has patient had a PCN reaction that required hospitalization: No Has patient had a PCN reaction occurring within the last 10 years: No If all of the above answers are "NO", then may proceed with Cephalosporin use.    Past Surgical History:  Procedure Laterality Date   ABDOMINAL HYSTERECTOMY  2011   birth mark removed     BREAST CYST ASPIRATION Right    negative 01/2010   BREAST EXCISIONAL BIOPSY  August 05, 2010   left breast positive 07/2010   BREAST LUMPECTOMY  2012   left breast   COLONOSCOPY  July 29, 2012   normal study.   robotic tonsillectomy Lingual tonsils Bilateral 06/08/2016   THYROID SURGERY     partially removed   TUBAL LIGATION      Social History    Tobacco Use   Smoking status: Never   Smokeless tobacco: Never  Vaping Use   Vaping Use: Never used  Substance Use Topics   Alcohol use: Not Currently   Drug use: Never     Medication list has been reviewed and updated.  Current Meds  Medication Sig   B Complex-Biotin-FA (B-COMPLEX PO) Take 1 tablet by mouth daily.   hydrochlorothiazide (HYDRODIURIL) 25 MG tablet Take 1 tablet (25 mg total) by mouth daily.   levothyroxine (SYNTHROID) 75 MCG tablet Take 75 mcg by mouth daily.   LORazepam (ATIVAN) 0.5 MG tablet TAKE 1 TABLET BY MOUTH EVERY 8 HOURS AS NEEDED FOR ANXIETY.   metoprolol succinate (TOPROL-XL) 100 MG 24 hr tablet Take 1 tablet (100 mg total) by mouth daily. Take with or immediately following a meal.   Multiple Vitamin (MULTIVITAMIN) tablet Take 1 tablet by mouth daily.   potassium chloride SA (KLOR-CON M) 20 MEQ tablet Take 1 tablet (20 mEq total) by mouth daily.   propafenone (RYTHMOL SR) 225 MG 12 hr capsule Take 225 mg by mouth daily.   propranolol (INDERAL) 20 MG tablet Take 1 tablet (20 mg total) by mouth 3 (three) times daily as needed.  01/31/2021    9:48 AM 12/04/2020    8:30 AM 11/11/2020    4:01 PM 09/30/2020    1:59 PM  GAD 7 : Generalized Anxiety Score  Nervous, Anxious, on Edge $Remov'1 1 1 'SqzpCd$ 0  Control/stop worrying 1 0 1 0  Worry too much - different things $RemoveBeforeDE'1 1 1 1  'UQATNQhUhbODkKs$ Trouble relaxing 2 0 0 0  Restless 2 1 0 0  Easily annoyed or irritable 2 0 0 0  Afraid - awful might happen 1 0 0 0  Total GAD 7 Score $Remov'10 3 3 1  'yOLqrd$ Anxiety Difficulty Not difficult at all Not difficult at all  Not difficult at all       01/31/2021    9:48 AM  Depression screen PHQ 2/9  Decreased Interest 0  Down, Depressed, Hopeless 0  PHQ - 2 Score 0  Altered sleeping 2  Tired, decreased energy 1  Change in appetite 0  Feeling bad or failure about yourself  0  Trouble concentrating 0  Moving slowly or fidgety/restless 0  Suicidal thoughts 0  PHQ-9 Score 3  Difficult doing  work/chores Not difficult at all    BP Readings from Last 3 Encounters:  07/15/21 132/84  05/26/21 130/80  05/13/21 (!) 163/91    Physical Exam Vitals and nursing note reviewed.  Constitutional:      General: She is not in acute distress.    Appearance: She is well-developed.  HENT:     Head: Normocephalic and atraumatic.     Right Ear: Tympanic membrane and ear canal normal.     Left Ear: Tympanic membrane and ear canal normal.     Nose:     Right Sinus: No maxillary sinus tenderness.     Left Sinus: No maxillary sinus tenderness.  Eyes:     General: No scleral icterus.       Right eye: No discharge.        Left eye: No discharge.     Conjunctiva/sclera: Conjunctivae normal.  Neck:     Thyroid: No thyromegaly.     Vascular: No carotid bruit.  Cardiovascular:     Rate and Rhythm: Normal rate and regular rhythm.     Pulses: Normal pulses.     Heart sounds: Normal heart sounds.  Pulmonary:     Effort: Pulmonary effort is normal. No respiratory distress.     Breath sounds: No wheezing.  Chest:  Breasts:    Right: No mass, nipple discharge, skin change or tenderness.     Left: No mass, nipple discharge, skin change or tenderness.  Abdominal:     General: Bowel sounds are normal.     Palpations: Abdomen is soft.     Tenderness: There is abdominal tenderness in the right lower quadrant. There is no guarding or rebound.  Genitourinary:    Comments: Pelvic deferred  Musculoskeletal:     Cervical back: Normal range of motion. No erythema.     Right lower leg: No edema.     Left lower leg: No edema.  Lymphadenopathy:     Cervical: No cervical adenopathy.  Skin:    General: Skin is warm and dry.     Findings: No rash.  Neurological:     Mental Status: She is alert and oriented to person, place, and time.     Cranial Nerves: No cranial nerve deficit.     Sensory: No sensory deficit.     Deep Tendon Reflexes: Reflexes are normal and symmetric.  Psychiatric:  Attention and Perception: Attention normal.        Mood and Affect: Mood normal.    Wt Readings from Last 3 Encounters:  07/15/21 267 lb (121.1 kg)  05/26/21 267 lb 4 oz (121.2 kg)  05/13/21 266 lb (120.7 kg)    BP 132/84   Pulse 92   Ht $R'5\' 7"'xv$  (1.702 m)   Wt 267 lb (121.1 kg)   LMP 04/01/2008   SpO2 98%   BMI 41.82 kg/m   Assessment and Plan: 1. Annual physical exam Exam is normal except for weight. Encourage regular exercise and appropriate dietary changes. Up to date on screenings and immunizations..  2. Essential hypertension Clinically stable exam with well controlled BP. Tolerating medications without side effects at this time. Pt to continue current regimen and low sodium diet; benefits of regular exercise as able discussed. - CBC with Differential/Platelet - Comprehensive metabolic panel - POCT urinalysis dipstick  3. Pelvic pain in female Pelvic exam deferred - will get [place] to evaluate the right adnexal discomfort - US Pelvic Complete With Transvaginal; Future  4. History of left breast cancer Oncology has released her from follow up but recommend bilat Dx mammo and US Exam today is benign - MM DIAG BREAST TOMO BILATERAL - US BREAST LTD UNI RIGHT INC AXILLA  5. Multiple thyroid nodules On Levothyroxine from ENT - TSH + free T4  6. Paroxysmal atrial fibrillation ([facility]) On medical management with recurrent episodes Continue follow up with Cardiology  7. Mixed hyperlipidemia Check labs  - Lipid panel  8. Screening for diabetes mellitus - Hemoglobin A1c  9. Acquired thrombophilia ([place]) On Eliquis without bleeding issues.   Partially dictated using Editor, commissioning. Any errors are unintentional.  Halina Maidens, MD [facility]  07/15/2021

## 2021-07-16 ENCOUNTER — Other Ambulatory Visit: Payer: Self-pay

## 2021-07-16 LAB — CBC WITH DIFFERENTIAL/PLATELET
Basophils Absolute: 0.1 10*3/uL (ref 0.0–0.2)
Basos: 1 %
EOS (ABSOLUTE): 0.2 10*3/uL (ref 0.0–0.4)
Eos: 3 %
Hematocrit: 40.6 % (ref 34.0–46.6)
Hemoglobin: 14 g/dL (ref 11.1–15.9)
Immature Grans (Abs): 0 10*3/uL (ref 0.0–0.1)
Immature Granulocytes: 0 %
Lymphocytes Absolute: 3.2 10*3/uL — ABNORMAL HIGH (ref 0.7–3.1)
Lymphs: 36 %
MCH: 29.2 pg (ref 26.6–33.0)
MCHC: 34.5 g/dL (ref 31.5–35.7)
MCV: 85 fL (ref 79–97)
Monocytes Absolute: 0.5 10*3/uL (ref 0.1–0.9)
Monocytes: 5 %
Neutrophils Absolute: 5 10*3/uL (ref 1.4–7.0)
Neutrophils: 55 %
Platelets: 301 10*3/uL (ref 150–450)
RBC: 4.79 x10E6/uL (ref 3.77–5.28)
RDW: 14.4 % (ref 11.7–15.4)
WBC: 9 10*3/uL (ref 3.4–10.8)

## 2021-07-16 LAB — COMPREHENSIVE METABOLIC PANEL
ALT: 12 IU/L (ref 0–32)
AST: 16 IU/L (ref 0–40)
Albumin/Globulin Ratio: 1.7 (ref 1.2–2.2)
Albumin: 4.3 g/dL (ref 3.8–4.9)
Alkaline Phosphatase: 83 IU/L (ref 44–121)
BUN/Creatinine Ratio: 17 (ref 9–23)
BUN: 17 mg/dL (ref 6–24)
Bilirubin Total: 0.4 mg/dL (ref 0.0–1.2)
CO2: 24 mmol/L (ref 20–29)
Calcium: 9.7 mg/dL (ref 8.7–10.2)
Chloride: 99 mmol/L (ref 96–106)
Creatinine, Ser: 0.99 mg/dL (ref 0.57–1.00)
Globulin, Total: 2.5 g/dL (ref 1.5–4.5)
Glucose: 99 mg/dL (ref 70–99)
Potassium: 4.2 mmol/L (ref 3.5–5.2)
Sodium: 139 mmol/L (ref 134–144)
Total Protein: 6.8 g/dL (ref 6.0–8.5)
eGFR: 66 mL/min/{1.73_m2} (ref >59)

## 2021-07-16 LAB — LIPID PANEL
Chol/HDL Ratio: 6.2 ratio — ABNORMAL HIGH (ref 0.0–4.4)
Cholesterol, Total: 271 mg/dL — ABNORMAL HIGH (ref 100–199)
HDL: 44 mg/dL (ref >39)
LDL Chol Calc (NIH): 203 mg/dL — ABNORMAL HIGH (ref 0–99)
Triglycerides: 133 mg/dL (ref 0–149)
VLDL Cholesterol Cal: 24 mg/dL (ref 5–40)

## 2021-07-16 LAB — HEMOGLOBIN A1C
Est. average glucose Bld gHb Est-mCnc: 128 mg/dL
Hgb A1c MFr Bld: 6.1 % — ABNORMAL HIGH (ref 4.8–5.6)

## 2021-07-16 LAB — TSH+FREE T4
Free T4: 0.87 ng/dL (ref 0.82–1.77)
TSH: 2.43 u[IU]/mL (ref 0.450–4.500)

## 2021-07-23 ENCOUNTER — Ambulatory Visit
Admission: RE | Admit: 2021-07-23 | Discharge: 2021-07-23 | Disposition: A | Discharge status: Home / Self Care | Payer: BC Managed Care – PPO | Source: Ambulatory Visit | Attending: Internal Medicine | Admitting: Internal Medicine

## 2021-07-23 DIAGNOSIS — Z90710 Acquired absence of both cervix and uterus: Secondary | ICD-10-CM | POA: Diagnosis not present

## 2021-07-23 DIAGNOSIS — R102 Pelvic and perineal pain: Secondary | ICD-10-CM | POA: Diagnosis not present

## 2021-08-01 ENCOUNTER — Ambulatory Visit
Admission: RE | Admit: 2021-08-01 | Discharge: 2021-08-01 | Disposition: A | Discharge status: Home / Self Care | Payer: BC Managed Care – PPO | Source: Ambulatory Visit | Attending: Internal Medicine | Admitting: Internal Medicine

## 2021-08-01 DIAGNOSIS — Z853 Personal history of malignant neoplasm of breast: Secondary | ICD-10-CM | POA: Insufficient documentation

## 2021-08-01 DIAGNOSIS — N6311 Unspecified lump in the right breast, upper outer quadrant: Secondary | ICD-10-CM | POA: Diagnosis not present

## 2021-08-01 DIAGNOSIS — R922 Inconclusive mammogram: Secondary | ICD-10-CM | POA: Diagnosis not present

## 2021-09-23 ENCOUNTER — Encounter: Payer: Self-pay | Admitting: Family Medicine

## 2021-09-23 ENCOUNTER — Other Ambulatory Visit: Payer: Self-pay

## 2021-09-23 ENCOUNTER — Ambulatory Visit (INDEPENDENT_AMBULATORY_CARE_PROVIDER_SITE_OTHER): Payer: BC Managed Care – PPO | Admitting: Family Medicine

## 2021-09-23 ENCOUNTER — Ambulatory Visit: Payer: Self-pay

## 2021-09-23 VITALS — BP 140/80 | HR 82 | Ht 67.0 in | Wt 272.8 lb

## 2021-09-23 DIAGNOSIS — D224 Melanocytic nevi of scalp and neck: Secondary | ICD-10-CM | POA: Diagnosis not present

## 2021-09-23 DIAGNOSIS — Z853 Personal history of malignant neoplasm of breast: Secondary | ICD-10-CM

## 2021-09-23 DIAGNOSIS — I890 Lymphedema, not elsewhere classified: Secondary | ICD-10-CM | POA: Diagnosis not present

## 2021-09-23 NOTE — Telephone Encounter (Signed)
  Chief Complaint: Swelling of left arm Symptoms: Left arm swollen, warm Frequency: Ongoing but worse last night and today Pertinent Negatives: Patient denies fever, redness Disposition: '[]'$ ED /'[]'$ Urgent Care (no appt availability in office) / '[x]'$ Appointment(In office/virtual)/ '[]'$  [facility]/ '[]'$ Home Care/ '[]'$ Refused Recommended Disposition /'[]'$ [facility]/ '[]'$  Follow-up with PCP Additional Notes: Pt has hx of breast ca. Pt had lymph nodes removed from left arm. Pt has ongoing arm swelling, but starting last night swelling is worse. Pt states that swelling usually comes and goes, but this is occurring more often and arm feels warm.   Reason for Disposition  MODERATE arm swelling (e.g., puffiness or swollen feeling of entire arm)  Answer Assessment - Initial Assessment Questions 1. ONSET: "When did the swelling start?" (e.g., minutes, hours, days)     Ongoing, but worsening overnight 2. LOCATION: "What part of the arm is swollen?"  "Are both arms swollen or just one arm?"     Left arm, elbow to shoulder where lymph nodes were removed 3. SEVERITY: "How bad is the swelling?" (e.g., localized; mild, moderate, severe)   - LOCALIZED: Small area of puffiness or swelling on just one arm   - JOINT SWELLING: Swelling of one joint   - MILD: Puffiness or swelling of hand   - MODERATE: Puffiness or swollen feeling of entire arm    - SEVERE: All of arm looks swollen; pitting edema     moderate 4. REDNESS: "Does the swelling look red or infected?"     Feels warm,  5. PAIN: "Is the swelling painful to touch?" If Yes, ask: "How painful is it?"   (Scale 1-10; mild, moderate or severe)     no 6. FEVER: "Do you have a fever?" If Yes, ask: "What is it, how was it measured, and when did it start?"      no 7. CAUSE: "What do you think is causing the arm swelling?"     Lymphedema 8. MEDICAL HISTORY: "Do you have a history of heart failure, kidney disease, liver failure, or cancer?"      Breast ca, afib 9. RECURRENT SYMPTOM: "Have you had arm swelling before?" If Yes, ask: "When was the last time?" "What happened that time?"     yes 10. OTHER SYMPTOMS: "Do you have any other symptoms?" (e.g., chest pain, difficulty breathing)       no 11. PREGNANCY: "Is there any chance you are pregnant?" "When was your last menstrual period?"       na  Protocols used: Arm Swelling and Edema-A-AH

## 2021-09-23 NOTE — Telephone Encounter (Signed)
FYI-appointment at 3pm today

## 2021-09-23 NOTE — Progress Notes (Signed)
     Primary Care / Sports Medicine Office Visit  Patient Information:  Patient ID: Heather Dudley, female DOB: 1961-01-23 Age: 62 y.o. MRN: [id number]   Heather Dudley is a pleasant 61 y.o. female presenting with the following:  Chief Complaint  Patient presents with   Edema    Left arm, yesterday, Happens all the time, but no treatment. Was warm this morning but not now.     Vitals:   09/23/21 1446  BP: (!) 140/80  Pulse: 82   Vitals:   09/23/21 1446  Weight: 272 lb 12.8 oz (123.7 kg)  Height: '5\' 7"'$  (1.702 m)   Body mass index is 42.73 kg/m.  No results found.   Independent interpretation of notes and tests performed by another provider:   None  Procedures performed:   None  Pertinent History, Exam, Impression, and Recommendations:   Problem List Items Addressed This Visit       Musculoskeletal and Integument   Nevus of neck    Chronic issue, unchanged, as this is a raised central lesion at the posterior neck, she has expressed interest in further evaluation by dermatologist, referral placed today.      Relevant Orders   Ambulatory referral to Dermatology     Other   Lymphedema of left arm - Primary    Patient with history of Breast cancer status post mastoplasty, lymph node resection, and chemotherapy during the 2012 timeframe.  Being actively followed by oncology.  She has had a history of lymphedema and noted the same since yesterday, was concerned as she had noted mild warmth, throbbing sensation but otherwise denied erythema, fevers, chills, skin color changes, paresthesias.  She utilized compression dressing and gentle distal to proximal lymphatic massage.  She presents today stating that symptoms have essentially resolved, examination reveals left arm diameter just below the deltoid to be 45.5 cm, contralateral 46 cm.  Inspection reveals no skin color change abnormalities, no texture of maladies, no masses or lesions, nontender throughout the  axillary region, no fluctuance, distal pulses are palpable and essentially symmetric.  Skin is cool to the touch throughout the extremity, symmetric.  Patient's presentation consistent with episode of lymphedema, can be considered acute on chronic, did touch base with patient's oncology group will be advised further evaluation and follow-up.  This was coordinated today after the patient's visit with patient's hematologist, RN, scheduler, she can otherwise contact us for questions and follow-up as needed.  I provided a total time of 41 minutes including both face-to-face and non-face-to-face time on 09/23/2021 inclusive of time utilized for medical chart review, information gathering, care coordination with staff, and documentation completion.         Orders & Medications No orders of the defined types were placed in this encounter.  Orders Placed This Encounter  Procedures   Ambulatory referral to Dermatology     Return if symptoms worsen or fail to improve.     Montel Culver, MD   Primary Care Sports Medicine [place]

## 2021-09-23 NOTE — Assessment & Plan Note (Signed)
Chronic issue, unchanged, as this is a raised central lesion at the posterior neck, she has expressed interest in further evaluation by dermatologist, referral placed today.

## 2021-09-23 NOTE — Assessment & Plan Note (Addendum)
Patient with history of Breast cancer status post mastoplasty, lymph node resection, and chemotherapy during the 2012 timeframe.  Being actively followed by oncology.  She has had a history of lymphedema and noted the same since yesterday, was concerned as she had noted mild warmth, throbbing sensation but otherwise denied erythema, fevers, chills, skin color changes, paresthesias.  She utilized compression dressing and gentle distal to proximal lymphatic massage.  She presents today stating that symptoms have essentially resolved, examination reveals left arm diameter just below the deltoid to be 45.5 cm, contralateral 46 cm.  Inspection reveals no skin color change abnormalities, no texture of maladies, no masses or lesions, nontender throughout the axillary region, no fluctuance, distal pulses are palpable and essentially symmetric.  Skin is cool to the touch throughout the extremity, symmetric.  Patient's presentation consistent with episode of lymphedema, can be considered acute on chronic, did touch base with patient's oncology group will be advised further evaluation and follow-up.  This was coordinated today after the patient's visit with patient's hematologist, RN, scheduler, she can otherwise contact us for questions and follow-up as needed.  I provided a total time of 41 minutes including both face-to-face and non-face-to-face time on 09/23/2021 inclusive of time utilized for medical chart review, information gathering, care coordination with staff, and documentation completion.

## 2021-10-08 ENCOUNTER — Telehealth: Payer: Self-pay | Admitting: Oncology

## 2021-10-08 ENCOUNTER — Inpatient Hospital Stay: Payer: BC Managed Care – PPO | Attending: Oncology | Admitting: Occupational Therapy

## 2021-10-08 ENCOUNTER — Telehealth: Payer: Self-pay | Admitting: Cardiovascular Disease

## 2021-10-08 DIAGNOSIS — Z853 Personal history of malignant neoplasm of breast: Secondary | ICD-10-CM

## 2021-10-08 DIAGNOSIS — I890 Lymphedema, not elsewhere classified: Secondary | ICD-10-CM

## 2021-10-08 NOTE — Telephone Encounter (Signed)
Pt stated that Gwenette Greet wanted Dr. Tasia Catchings to get an approval for a sleeve?

## 2021-10-08 NOTE — Telephone Encounter (Signed)
Pt c/o medication issue:  1. Name of Medication:   metoprolol succinate (TOPROL-XL) 100 MG 24 hr tablet  2. How are you currently taking this medication (dosage and times per day)? As prescribed  3. Are you having a reaction (difficulty breathing--STAT)?  No  4. What is your medication issue?   Patient called stating she has been having weight gain with this medication and would like to get an alternate medication.  Patient stated she was previously on propranolol (INDERAL) 20 MG tablet and it is not showing in her online [facility] medication list and she would like to continue on this medication as needed.

## 2021-10-08 NOTE — Therapy (Signed)
[facility] at [facility] [place], [place], [place], [address] Phone: [phone]   Fax:  [phone]  Occupational Therapy Screen:  Patient Details  Name: Heather Dudley MRN: [id number] Date of Birth: [birth year] No data recorded  Encounter Date: 10/08/2021   OT End of Session - 10/08/21 1529     Visit Number 0             Past Medical History:  Diagnosis Date   Basal cell carcinoma of forehead    birthmark of forehead   Breast cancer ([place])    Breast screening, unspecified    Cellulitis and abscess of trunk    Diastolic dysfunction    a. 05/2019 Echo: EF 55-60%, no rwma, mild LVH, Gr2 DD, Nl RV size/fxn. Triv MR.   Hernia    History of chemotherapy 2012   Hypertension    Lump or mass in breast    Malignant neoplasm of breast (female), unspecified site    She underwent wide excision, mastoplasty and axillary dissection for her left breast malignancy on September 03, 2010. The primary tumor was 1 cm in diameter, and a single positive axillary node 1.3 cm in diameter. This was an ER-positive, PR slightly positive, HER-2/neu not overexpressing tumor. She tolerated her whole breast radiation without difficulty ending in late February 2013.   Malignant neoplasm of upper-outer quadrant of female breast ([place])    Neoplasm of uncertain behavior of connective and other soft tissue    Obesity, unspecified    PAF (paroxysmal atrial fibrillation) ([place]) 05/04/2017   a. 04/2019 Zio: RSR, avg HR 79. 2% PAF burden (73-175, avg 115; longest 40mns 28secs). CHA2DS2VASc = 2-->Xarelto.   Personal history of chemotherapy    Personal history of malignant neoplasm of breast    The patient underwent wide excision, mastoplasty. Dissection for a T1b, N1, M0 carcinoma left breast on September 03, 2010.The primary tumor was 1 cm in diameter, and a single positive axillary node 1.3 cm in diameter. This was an ER-positive, PR slightly positive, HER-2/neu not  overexpressing tumor.  The patient completed whole breast radiation in February 2013.   Personal history of radiation therapy 2013   LEFT lumpectomy   Screening for obesity    Thyroid nodule     Past Surgical History:  Procedure Laterality Date   ABDOMINAL HYSTERECTOMY  2011   ovaries remain   birth mark removed     BREAST CYST ASPIRATION Right    negative 01/2010   BREAST EXCISIONAL BIOPSY  08/05/2010   left breast positive 07/2010   BREAST LUMPECTOMY  2012   left breast   COLONOSCOPY  07/29/2012   normal study.   robotic tonsillectomy Lingual tonsils Bilateral 06/08/2016   THYROID SURGERY     partially removed   TUBAL LIGATION      There were no vitals filed for this visit.   Subjective Assessment - 10/08/21 1526     Subjective  I noticed about a year ago that my arm wants to feel little bit fuller tighter.  I think of seeing you like 12 years ago for my left upper extremity lymphedema.  But then gradually I did not need to sleep until lately that my arm wants to swell and then be really painful.  Sometimes it is even at nighttime.  I am on a medication for A-fib that I gained I feel like about 20 pounds since on it.    Currently in Pain? No/denies  LYMPHEDEMA/ONCOLOGY QUESTIONNAIRE - 10/08/21 0001       Right Upper Extremity Lymphedema   15 cm Proximal to Olecranon Process 46 cm    10 cm Proximal to Olecranon Process 45.3 cm    Olecranon Process 35 cm    15 cm Proximal to Ulnar Styloid Process 32 cm    10 cm Proximal to Ulnar Styloid Process 26 cm    Just Proximal to Ulnar Styloid Process 19.3 cm      Left Upper Extremity Lymphedema   15 cm Proximal to Olecranon Process 45 cm    10 cm Proximal to Olecranon Process 44 cm    Olecranon Process 34.5 cm    15 cm Proximal to Ulnar Styloid Process 31.7 cm    10 cm Proximal to Ulnar Styloid Process 26 cm    Just Proximal to Ulnar Styloid Process 19 cm                  09/23/21 DR Rodman Key  visit note:   Lymphedema of left arm - Primary       Patient with history of Breast cancer status post mastoplasty, lymph node resection, and chemotherapy during the 2012 timeframe.  Being actively followed by oncology.  She has had a history of lymphedema and noted the same since yesterday, was concerned as she had noted mild warmth, throbbing sensation but otherwise denied erythema, fevers, chills, skin color changes, paresthesias.  She utilized compression dressing and gentle distal to proximal lymphatic massage.  She presents today stating that symptoms have essentially resolved, examination reveals left arm diameter just below the deltoid to be 45.5 cm, contralateral 46 cm.  Inspection reveals no skin color change abnormalities, no texture of maladies, no masses or lesions, nontender throughout the axillary region, no fluctuance, distal pulses are palpable and essentially symmetric.  Skin is cool to the touch throughout the extremity, symmetric.   Patient's presentation consistent with episode of lymphedema, can be considered acute on chronic, did touch base with patient's oncology group will be advised further evaluation and follow-up.  This was coordinated today after the patient's visit with patient's hematologist, RN, scheduler, she can otherwise contact us for questions and follow-up as needed.          OT SCREEN 10/08/21: Patient arrive with reports of increased issues with left upper extremity lymphedema.  Patient reports she seen me around 2012 for left upper extremity lymphedema but gradually over the years did not need compression.  Upon further assessment patient reports that with her A-fib medication she gained about 20 pounds.  She did notice increased tightness fullness in that left upper extremity as well as on thoracic wall swelling at times for about a year. Patient reports she did drive initial period of time about a year ago twice up to [place], she is active in the garden and  work on the computer most of the time. She do reports she started wearing her old sleeve when she feels increased fullness, tightness and discomfort in left upper extremity that did decrease symptoms. Upon assessment for circumference of bilateral upper extremities left was within normal limits compared to the right.  Patient do feel like and not a full or tight feeling at the moment. Would recommend at this stage that she get a unilateral  postmastectomy jovipak breast pad to wear at nighttime when feeling swelling or fullness under her arm on thoracic wall as well as Mondays and Fridays when she works from home for 2 weeks.  Also recommend for patient to get a  over the counter replacement compression sleeve for L UE because of weight change of 20 pounds as well as her sleeve is 59 years old. Patient to wear new compression sleeve with high risk activities-educated patient on those activities and patient to follow-up with me in 2 to 3 weeks.                      Visit Diagnosis: History of left breast cancer  Lymphedema, not elsewhere classified    Problem List Patient Active Problem List   Diagnosis Date Noted   Lymphedema of left arm 09/23/2021   Nevus of neck 09/23/2021   Blood in stool 05/13/2021   Peroneal tendinitis, left 09/30/2020   OSA on CPAP 07/12/2020   Acquired thrombophilia ([place]) 07/11/2020   Breast cyst, right 05/08/2020   Status post hysterectomy 04/03/2020   Colon cancer screening 07/13/2019   Paroxysmal atrial fibrillation ([place]) 07/11/2019   Multiple thyroid nodules 08/23/2018   Mixed hyperlipidemia 07/06/2018   Microscopic hematuria 05/04/2018   Liver hemangioma 03/26/2017   Panic disorder 06/15/2016   Macroglossia 02/05/2016   Environmental and seasonal allergies 01/02/2016   Hot flashes related to aromatase inhibitor therapy 09/08/2015   Vitamin D deficiency 04/19/2015   Cervical radiculopathy 04/17/2015   Essential hypertension 04/17/2015    History of partial thyroidectomy 04/17/2015   Obesity, Class II, BMI 35-39.9 04/17/2015   Lipoma of axilla 10/24/2014   History of left breast cancer 09/02/2010    Rosalyn Gess, OTR/L,CLT 10/08/2021, 3:30 PM  [facility] [facility] at [facility] [address] [place], [place], [address] Phone: [phone]   Fax:  [phone]  Name: Heather Dudley MRN: [id number] Date of Birth: 1960-03-16

## 2021-10-10 NOTE — Telephone Encounter (Signed)
Prescription for Left lymphadema sleeve and Jovi breast pack faxed to [facility] med supply

## 2021-10-13 DIAGNOSIS — I890 Lymphedema, not elsewhere classified: Secondary | ICD-10-CM | POA: Diagnosis not present

## 2021-10-13 MED ORDER — PROPRANOLOL HCL ER 60 MG PO CP24: 60.0000 mg | ORAL_CAPSULE | Freq: Every day | ORAL | 11 refills | Status: DC

## 2021-10-13 NOTE — Telephone Encounter (Signed)
Patient in agreement to starting Propranolol 60 mg daily and stopping Metoprolol succinate. Will give this change some time and call in if she has breakthrough Afib.  Patient verbalized understanding.

## 2021-10-13 NOTE — Telephone Encounter (Signed)
Changing from metoprolol succinate to propranolol  She could take propranolol up to 3 times a day  Previously this was prescribed 20 mg 3 times daily as needed  Alternatively could stop the metoprolol succinate and try extended release version of propranolol such as propranolol ER 60 mg daily  There are higher doses of propranolol ER that might be needed if she has breakthrough A-fib spells  Thx  TGollan

## 2021-10-20 DIAGNOSIS — Z9012 Acquired absence of left breast and nipple: Secondary | ICD-10-CM | POA: Diagnosis not present

## 2021-10-20 DIAGNOSIS — Z4432 Encounter for fitting and adjustment of external left breast prosthesis: Secondary | ICD-10-CM | POA: Diagnosis not present

## 2021-10-20 DIAGNOSIS — C50912 Malignant neoplasm of unspecified site of left female breast: Secondary | ICD-10-CM | POA: Diagnosis not present

## 2021-10-20 DIAGNOSIS — Z853 Personal history of malignant neoplasm of breast: Secondary | ICD-10-CM | POA: Diagnosis not present

## 2021-10-22 ENCOUNTER — Inpatient Hospital Stay: Payer: BC Managed Care – PPO | Admitting: Occupational Therapy

## 2021-10-22 DIAGNOSIS — I890 Lymphedema, not elsewhere classified: Secondary | ICD-10-CM

## 2021-10-22 NOTE — Therapy (Signed)
[facility] at [facility] [place], [place], [place], [address] Phone: [phone]   Fax:  [phone]  Occupational Therapy Screen  Patient Details  Name: Heather Dudley MRN: [id number] Date of Birth: [birth year] No data recorded  Encounter Date: 10/22/2021   OT End of Session - 10/22/21 0826     Visit Number 0             Past Medical History:  Diagnosis Date   Basal cell carcinoma of forehead    birthmark of forehead   Breast cancer ([place])    Breast screening, unspecified    Cellulitis and abscess of trunk    Diastolic dysfunction    a. 05/2019 Echo: EF 55-60%, no rwma, mild LVH, Gr2 DD, Nl RV size/fxn. Triv MR.   Hernia    History of chemotherapy 2012   Hypertension    Lump or mass in breast    Malignant neoplasm of breast (female), unspecified site    She underwent wide excision, mastoplasty and axillary dissection for her left breast malignancy on September 03, 2010. The primary tumor was 1 cm in diameter, and a single positive axillary node 1.3 cm in diameter. This was an ER-positive, PR slightly positive, HER-2/neu not overexpressing tumor. She tolerated her whole breast radiation without difficulty ending in late February 2013.   Malignant neoplasm of upper-outer quadrant of female breast ([place])    Neoplasm of uncertain behavior of connective and other soft tissue    Obesity, unspecified    PAF (paroxysmal atrial fibrillation) ([place]) 05/04/2017   a. 04/2019 Zio: RSR, avg HR 79. 2% PAF burden (73-175, avg 115; longest 33mins 28secs). CHA2DS2VASc = 2-->Xarelto.   Personal history of chemotherapy    Personal history of malignant neoplasm of breast    The patient underwent wide excision, mastoplasty. Dissection for a T1b, N1, M0 carcinoma left breast on September 03, 2010.The primary tumor was 1 cm in diameter, and a single positive axillary node 1.3 cm in diameter. This was an ER-positive, PR slightly positive, HER-2/neu not  overexpressing tumor.  The patient completed whole breast radiation in February 2013.   Personal history of radiation therapy 2013   LEFT lumpectomy   Screening for obesity    Thyroid nodule     Past Surgical History:  Procedure Laterality Date   ABDOMINAL HYSTERECTOMY  2011   ovaries remain   birth mark removed     BREAST CYST ASPIRATION Right    negative 01/2010   BREAST EXCISIONAL BIOPSY  08/05/2010   left breast positive 07/2010   BREAST LUMPECTOMY  2012   left breast   COLONOSCOPY  07/29/2012   normal study.   robotic tonsillectomy Lingual tonsils Bilateral 06/08/2016   THYROID SURGERY     partially removed   TUBAL LIGATION      There were no vitals filed for this visit.   Subjective Assessment - 10/22/21 0820     Subjective  I did get a new compression sleeve as well as the Jovi pack.  I will the Jovi pack may be 2 days because I had to wait for it.  But the sleeve I wore about every day.  I could feel like some achiness heaviness in the upper arm.  And then I could feel the Jovi pack helped for the swelling or bulkiness under my arm.    Currently in Pain? No/denies  LYMPHEDEMA/ONCOLOGY QUESTIONNAIRE - 10/22/21 0001       Right Upper Extremity Lymphedema   15 cm Proximal to Olecranon Process 47 cm    10 cm Proximal to Olecranon Process 44.8 cm    Olecranon Process 34.5 cm    15 cm Proximal to Ulnar Styloid Process 31.7 cm    10 cm Proximal to Ulnar Styloid Process 26.2 cm      Left Upper Extremity Lymphedema   15 cm Proximal to Olecranon Process 45.5 cm    10 cm Proximal to Olecranon Process 44 cm    Olecranon Process 34.4 cm    15 cm Proximal to Ulnar Styloid Process 31.2 cm    10 cm Proximal to Ulnar Styloid Process 26 cm                   09/23/21 DR Rodman Key visit note:     Lymphedema of left arm - Primary        Patient with history of Breast cancer status post mastoplasty, lymph node resection, and chemotherapy during  the 2012 timeframe.  Being actively followed by oncology.  She has had a history of lymphedema and noted the same since yesterday, was concerned as she had noted mild warmth, throbbing sensation but otherwise denied erythema, fevers, chills, skin color changes, paresthesias.  She utilized compression dressing and gentle distal to proximal lymphatic massage.  She presents today stating that symptoms have essentially resolved, examination reveals left arm diameter just below the deltoid to be 45.5 cm, contralateral 46 cm.  Inspection reveals no skin color change abnormalities, no texture of maladies, no masses or lesions, nontender throughout the axillary region, no fluctuance, distal pulses are palpable and essentially symmetric.  Skin is cool to the touch throughout the extremity, symmetric.   Patient's presentation consistent with episode of lymphedema, can be considered acute on chronic, did touch base with patient's oncology group will be advised further evaluation and follow-up.  This was coordinated today after the patient's visit with patient's hematologist, RN, scheduler, she can otherwise contact us for questions and follow-up as needed.               OT SCREEN 10/08/21: Patient arrive with reports of increased issues with left upper extremity lymphedema.  Patient reports she seen me around 2012 for left upper extremity lymphedema but gradually over the years did not need compression.  Upon further assessment patient reports that with her A-fib medication she gained about 20 pounds.  She did notice increased tightness fullness in that left upper extremity as well as on thoracic wall swelling at times for about a year. Patient reports she did drive initial period of time about a year ago twice up to [place], she is active in the garden and work on the computer most of the time. She do reports she started wearing her old sleeve when she feels increased fullness, tightness and discomfort in left  upper extremity that did decrease symptoms. Upon assessment for circumference of bilateral upper extremities left was within normal limits compared to the right.  Patient do feel like and not a full or tight feeling at the moment. Would recommend at this stage that she get a unilateral  postmastectomy jovipak breast pad to wear at nighttime when feeling swelling or fullness under her arm on thoracic wall as well as Mondays and Fridays when she works from home for 2 weeks.  Also recommend for patient to get a  over the counter replacement  compression sleeve for L UE because of weight change of 20 pounds as well as her sleeve is 59 years old. Patient to wear new compression sleeve with high risk activities-educated patient on those activities and patient to follow-up with me in 2 to 3 weeks.      OT SCREEN 10/22/21: Patient was seen for OT screen about 2 weeks ago because of increased lymphedema symptoms in the left upper extremity.  Patient returns today with new compression sleeve that she wore for the last 2 weeks.  She did not wear the Jovi pack breast pad as much because she got a later. She did notice increased tightness fullness in that left upper extremity as well as on thoracic wall swelling at times for about a year. Upon assessment for circumference of bilateral upper extremities left was within normal limits again this date compared to the right.  Patient do not feel full or tight feeling at the moment. Reinforced with patient again to use unilateral  postmastectomy jovipak breast pad to wear at nighttime when feeling swelling or fullness under her arm on thoracic wall as well as Mondays and Fridays/weekends  when she works from home for  next 2-3 weeks.  That we will clear her thoracic and prevent her from being so dependent on her sleeve.   She did get new over the counter compression sleeve for L UE and wore in daily since then and it helped. But wore it even one night-reinforced with patient  again she cannot sleep with the daytime compression sleeve because of tourniquet. Patient to wear new compression sleeve with high risk activities-and Jovi pack in the evenings or Fridays to Mondays to cleared thoracic lymphedema.   Educated patient on those activities and patient to follow-up with me in 3 weeks.                               Visit Diagnosis: Lymphedema, not elsewhere classified    Problem List Patient Active Problem List   Diagnosis Date Noted   Lymphedema of left arm 09/23/2021   Nevus of neck 09/23/2021   Blood in stool 05/13/2021   Peroneal tendinitis, left 09/30/2020   OSA on CPAP 07/12/2020   Acquired thrombophilia ([place]) 07/11/2020   Breast cyst, right 05/08/2020   Status post hysterectomy 04/03/2020   Colon cancer screening 07/13/2019   Paroxysmal atrial fibrillation ([place]) 07/11/2019   Multiple thyroid nodules 08/23/2018   Mixed hyperlipidemia 07/06/2018   Microscopic hematuria 05/04/2018   Liver hemangioma 03/26/2017   Panic disorder 06/15/2016   Macroglossia 02/05/2016   Environmental and seasonal allergies 01/02/2016   Hot flashes related to aromatase inhibitor therapy 09/08/2015   Vitamin D deficiency 04/19/2015   Cervical radiculopathy 04/17/2015   Essential hypertension 04/17/2015   History of partial thyroidectomy 04/17/2015   Obesity, Class II, BMI 35-39.9 04/17/2015   Lipoma of axilla 10/24/2014   History of left breast cancer 09/02/2010    Rosalyn Gess, OTR/L,CLT 10/22/2021, 8:27 AM  [place] [facility] at [facility] [address] [place], [place], [address] Phone: [phone]   Fax:  [phone]  Name: RIKI BERNINGER MRN: [id number] Date of Birth: [birth year]

## 2021-10-31 DIAGNOSIS — G4733 Obstructive sleep apnea (adult) (pediatric): Secondary | ICD-10-CM | POA: Diagnosis not present

## 2021-10-31 DIAGNOSIS — I10 Essential (primary) hypertension: Secondary | ICD-10-CM | POA: Diagnosis not present

## 2021-11-12 ENCOUNTER — Inpatient Hospital Stay: Payer: BC Managed Care – PPO | Attending: Oncology | Admitting: Occupational Therapy

## 2021-11-12 DIAGNOSIS — I890 Lymphedema, not elsewhere classified: Secondary | ICD-10-CM

## 2021-11-12 NOTE — Therapy (Signed)
[facility] at [facility] [place], [place], [place], [address] Phone: [phone]   Fax:  [phone]  Occupational Therapy Screen:  Patient Details  Name: Heather Dudley MRN: [id number] Date of Birth: 11/01/1960 No data recorded  Encounter Date: 11/12/2021   OT End of Session - 11/12/21 1118     Visit Number 0             Past Medical History:  Diagnosis Date   Basal cell carcinoma of forehead    birthmark of forehead   Breast cancer ([place])    Breast screening, unspecified    Cellulitis and abscess of trunk    Diastolic dysfunction    a. 05/2019 Echo: EF 55-60%, no rwma, mild LVH, Gr2 DD, Nl RV size/fxn. Triv MR.   Hernia    History of chemotherapy 2012   Hypertension    Lump or mass in breast    Malignant neoplasm of breast (female), unspecified site    She underwent wide excision, mastoplasty and axillary dissection for her left breast malignancy on September 03, 2010. The primary tumor was 1 cm in diameter, and a single positive axillary node 1.3 cm in diameter. This was an ER-positive, PR slightly positive, HER-2/neu not overexpressing tumor. She tolerated her whole breast radiation without difficulty ending in late February 2013.   Malignant neoplasm of upper-outer quadrant of female breast ([place])    Neoplasm of uncertain behavior of connective and other soft tissue    Obesity, unspecified    PAF (paroxysmal atrial fibrillation) ([place]) 05/04/2017   a. 04/2019 Zio: RSR, avg HR 79. 2% PAF burden (73-175, avg 115; longest 3mns 28secs). CHA2DS2VASc = 2-->Xarelto.   Personal history of chemotherapy    Personal history of malignant neoplasm of breast    The patient underwent wide excision, mastoplasty. Dissection for a T1b, N1, M0 carcinoma left breast on September 03, 2010.The primary tumor was 1 cm in diameter, and a single positive axillary node 1.3 cm in diameter. This was an ER-positive, PR slightly positive, HER-2/neu not  overexpressing tumor.  The patient completed whole breast radiation in February 2013.   Personal history of radiation therapy 2013   LEFT lumpectomy   Screening for obesity    Thyroid nodule     Past Surgical History:  Procedure Laterality Date   ABDOMINAL HYSTERECTOMY  2011   ovaries remain   birth mark removed     BREAST CYST ASPIRATION Right    negative 01/2010   BREAST EXCISIONAL BIOPSY  08/05/2010   left breast positive 07/2010   BREAST LUMPECTOMY  2012   left breast   COLONOSCOPY  07/29/2012   normal study.   robotic tonsillectomy Lingual tonsils Bilateral 06/08/2016   THYROID SURGERY     partially removed   TUBAL LIGATION      There were no vitals filed for this visit.   Subjective Assessment - 11/12/21 1116     Subjective  So since have seen you 3 weeks ago I was wearing the Jovi pack about twice a week.  And that made a difference I did not had to wear the sleeve.  I do think that exercise wheel that I was doing was causing some of my problems.    Currently in Pain? No/denies                 LYMPHEDEMA/ONCOLOGY QUESTIONNAIRE - 11/12/21 0001       Right Upper Extremity Lymphedema   15 cm  Proximal to Olecranon Process 47 cm    10 cm Proximal to Olecranon Process 45 cm    Olecranon Process 35 cm    15 cm Proximal to Ulnar Styloid Process 32 cm    10 cm Proximal to Ulnar Styloid Process 25.4 cm    Just Proximal to Ulnar Styloid Process 19.2 cm      Left Upper Extremity Lymphedema   15 cm Proximal to Olecranon Process 45.5 cm    10 cm Proximal to Olecranon Process 44 cm    Olecranon Process 34.5 cm    15 cm Proximal to Ulnar Styloid Process 31.5 cm    10 cm Proximal to Ulnar Styloid Process 25.3 cm               OT SCREEN 10/08/21: Patient arrive with reports of increased issues with left upper extremity lymphedema.  Patient reports she seen me around 2012 for left upper extremity lymphedema but gradually over the years did not need compression.   Upon further assessment patient reports that with her A-fib medication she gained about 20 pounds.  She did notice increased tightness fullness in that left upper extremity as well as on thoracic wall swelling at times for about a year. Patient reports she did drive initial period of time about a year ago twice up to [place], she is active in the garden and work on the computer most of the time. She do reports she started wearing her old sleeve when she feels increased fullness, tightness and discomfort in left upper extremity that did decrease symptoms. Upon assessment for circumference of bilateral upper extremities left was within normal limits compared to the right.  Patient do feel like and not a full or tight feeling at the moment. Would recommend at this stage that she get a unilateral  postmastectomy jovipak breast pad to wear at nighttime when feeling swelling or fullness under her arm on thoracic wall as well as Mondays and Fridays when she works from home for 2 weeks.  Also recommend for patient to get a  over the counter replacement compression sleeve for L UE because of weight change of 20 pounds as well as her sleeve is 59 years old. Patient to wear new compression sleeve with high risk activities-educated patient on those activities and patient to follow-up with me in 2 to 3 weeks.      OT SCREEN 10/22/21: Patient was seen for OT screen about 2 weeks ago because of increased lymphedema symptoms in the left upper extremity.  Patient returns today with new compression sleeve that she wore for the last 2 weeks.  She did not wear the Jovi pack breast pad as much because she got a later. She did notice increased tightness fullness in that left upper extremity as well as on thoracic wall swelling at times for about a year. Upon assessment for circumference of bilateral upper extremities left was within normal limits again this date compared to the right.  Patient do not feel full or tight feeling  at the moment. Reinforced with patient again to use unilateral  postmastectomy jovipak breast pad to wear at nighttime when feeling swelling or fullness under her arm on thoracic wall as well as Mondays and Fridays/weekends  when she works from home for  next 2-3 weeks.  That we will clear her thoracic and prevent her from being so dependent on her sleeve.   She did get new over the counter compression sleeve for L UE and  wore in daily since then and it helped. But wore it even one night-reinforced with patient again she cannot sleep with the daytime compression sleeve because of tourniquet. Patient to wear new compression sleeve with high risk activities-and Jovi pack in the evenings or Fridays to Mondays to cleared thoracic lymphedema.   Educated patient on those activities and patient to follow-up with me in 3 weeks.        OT SCREEN 11/12/21:  Patient was seen for OT screen about 5 weeks ago because of increased lymphedema symptoms in the left upper extremity.  This past 3 weeks she wore her unilateral postmastectomy Jovi pack at least twice a week with great results clearing the thoracic causing her not to be dependent on a compression sleeve on left upper extremity.   She did not have the last 3 weeks any heavy or full feeling or swelling in the left upper extremity.   She still feels that her symptoms came on when she started doing exercise routine using exercises resisted we will.   She did get a new compression sleeve. Reinforced with patient again that unilateral  postmastectomy jovipak breast pad to wear at nighttime when feeling swelling or fullness under her arm on thoracic wall as well as Mondays and Fridays/weekends  when she works from home to clear her thoracic and prevent her from being so dependent on her sleeve.    Patient to wear new compression sleeve with high risk activities-and Jovi pack in the evenings or Fridays to Mondays to cleared thoracic lymphedema.   Reviewed with  patient another upper extremity exercise routine using resistive bands but focusing on upper back and scapular strengthening, D1-D2 patterns for shoulder and bicep and tricep. Patient's measurements and circumference in L UE WNL compare to R - and pt had home program to maintain her stage 1 lymphedema.   Patient can call me if need to follow-up in future.                              Visit Diagnosis: Lymphedema, not elsewhere classified    Problem List Patient Active Problem List   Diagnosis Date Noted   Lymphedema of left arm 09/23/2021   Nevus of neck 09/23/2021   Blood in stool 05/13/2021   Peroneal tendinitis, left 09/30/2020   OSA on CPAP 07/12/2020   Acquired thrombophilia ([place]) 07/11/2020   Breast cyst, right 05/08/2020   Status post hysterectomy 04/03/2020   Colon cancer screening 07/13/2019   Paroxysmal atrial fibrillation ([place]) 07/11/2019   Multiple thyroid nodules 08/23/2018   Mixed hyperlipidemia 07/06/2018   Microscopic hematuria 05/04/2018   Liver hemangioma 03/26/2017   Panic disorder 06/15/2016   Macroglossia 02/05/2016   Environmental and seasonal allergies 01/02/2016   Hot flashes related to aromatase inhibitor therapy 09/08/2015   Vitamin D deficiency 04/19/2015   Cervical radiculopathy 04/17/2015   Essential hypertension 04/17/2015   History of partial thyroidectomy 04/17/2015   Obesity, Class II, BMI 35-39.9 04/17/2015   Lipoma of axilla 10/24/2014   History of left breast cancer 09/02/2010    Rosalyn Gess, OTR/L,CLT 11/12/2021, 11:20 AM  [facility] [address] [place], [place], [address] Phone: [phone]   Fax:  [phone]  Name: LEYDY WORTHEY MRN: [id number] Date of Birth: [birth year]

## 2021-11-30 DIAGNOSIS — G4733 Obstructive sleep apnea (adult) (pediatric): Secondary | ICD-10-CM | POA: Diagnosis not present

## 2021-11-30 DIAGNOSIS — I10 Essential (primary) hypertension: Secondary | ICD-10-CM | POA: Diagnosis not present

## 2021-12-05 ENCOUNTER — Encounter: Payer: Self-pay | Admitting: Intensive Care

## 2021-12-05 ENCOUNTER — Inpatient Hospital Stay
Admission: EM | Admit: 2021-12-05 | Discharge: 2021-12-09 | DRG: 286 | Disposition: A | Discharge status: Home / Self Care | Payer: BC Managed Care – PPO | Attending: Osteopathic Medicine | Admitting: Osteopathic Medicine

## 2021-12-05 ENCOUNTER — Emergency Department: Payer: BC Managed Care – PPO

## 2021-12-05 ENCOUNTER — Other Ambulatory Visit: Payer: Self-pay

## 2021-12-05 DIAGNOSIS — I214 Non-ST elevation (NSTEMI) myocardial infarction: Secondary | ICD-10-CM | POA: Diagnosis not present

## 2021-12-05 DIAGNOSIS — Z801 Family history of malignant neoplasm of trachea, bronchus and lung: Secondary | ICD-10-CM

## 2021-12-05 DIAGNOSIS — I4892 Unspecified atrial flutter: Secondary | ICD-10-CM | POA: Diagnosis present

## 2021-12-05 DIAGNOSIS — I2510 Atherosclerotic heart disease of native coronary artery without angina pectoris: Secondary | ICD-10-CM | POA: Diagnosis present

## 2021-12-05 DIAGNOSIS — Z923 Personal history of irradiation: Secondary | ICD-10-CM | POA: Diagnosis not present

## 2021-12-05 DIAGNOSIS — R0789 Other chest pain: Secondary | ICD-10-CM | POA: Diagnosis not present

## 2021-12-05 DIAGNOSIS — I5031 Acute diastolic (congestive) heart failure: Secondary | ICD-10-CM | POA: Diagnosis not present

## 2021-12-05 DIAGNOSIS — Z8249 Family history of ischemic heart disease and other diseases of the circulatory system: Secondary | ICD-10-CM

## 2021-12-05 DIAGNOSIS — Z90710 Acquired absence of both cervix and uterus: Secondary | ICD-10-CM | POA: Diagnosis not present

## 2021-12-05 DIAGNOSIS — I2489 Other forms of acute ischemic heart disease: Secondary | ICD-10-CM | POA: Diagnosis present

## 2021-12-05 DIAGNOSIS — N179 Acute kidney failure, unspecified: Secondary | ICD-10-CM | POA: Diagnosis present

## 2021-12-05 DIAGNOSIS — E876 Hypokalemia: Secondary | ICD-10-CM | POA: Diagnosis present

## 2021-12-05 DIAGNOSIS — I10 Essential (primary) hypertension: Secondary | ICD-10-CM | POA: Diagnosis not present

## 2021-12-05 DIAGNOSIS — Z6841 Body Mass Index (BMI) 40.0 and over, adult: Secondary | ICD-10-CM

## 2021-12-05 DIAGNOSIS — Z888 Allergy status to other drugs, medicaments and biological substances status: Secondary | ICD-10-CM

## 2021-12-05 DIAGNOSIS — Z7901 Long term (current) use of anticoagulants: Secondary | ICD-10-CM | POA: Diagnosis not present

## 2021-12-05 DIAGNOSIS — Z8041 Family history of malignant neoplasm of ovary: Secondary | ICD-10-CM | POA: Diagnosis not present

## 2021-12-05 DIAGNOSIS — Z800 Family history of malignant neoplasm of digestive organs: Secondary | ICD-10-CM | POA: Diagnosis not present

## 2021-12-05 DIAGNOSIS — K921 Melena: Secondary | ICD-10-CM | POA: Diagnosis present

## 2021-12-05 DIAGNOSIS — R079 Chest pain, unspecified: Secondary | ICD-10-CM | POA: Diagnosis present

## 2021-12-05 DIAGNOSIS — I480 Paroxysmal atrial fibrillation: Principal | ICD-10-CM | POA: Diagnosis present

## 2021-12-05 DIAGNOSIS — Z853 Personal history of malignant neoplasm of breast: Secondary | ICD-10-CM

## 2021-12-05 DIAGNOSIS — G4733 Obstructive sleep apnea (adult) (pediatric): Secondary | ICD-10-CM | POA: Diagnosis not present

## 2021-12-05 DIAGNOSIS — H53459 Other localized visual field defect, unspecified eye: Secondary | ICD-10-CM | POA: Diagnosis not present

## 2021-12-05 DIAGNOSIS — E669 Obesity, unspecified: Secondary | ICD-10-CM | POA: Diagnosis not present

## 2021-12-05 DIAGNOSIS — E785 Hyperlipidemia, unspecified: Secondary | ICD-10-CM | POA: Diagnosis not present

## 2021-12-05 DIAGNOSIS — I5033 Acute on chronic diastolic (congestive) heart failure: Secondary | ICD-10-CM | POA: Diagnosis present

## 2021-12-05 DIAGNOSIS — Z85828 Personal history of other malignant neoplasm of skin: Secondary | ICD-10-CM | POA: Diagnosis not present

## 2021-12-05 DIAGNOSIS — Z79890 Hormone replacement therapy (postmenopausal): Secondary | ICD-10-CM

## 2021-12-05 DIAGNOSIS — R072 Precordial pain: Secondary | ICD-10-CM

## 2021-12-05 DIAGNOSIS — Z9221 Personal history of antineoplastic chemotherapy: Secondary | ICD-10-CM | POA: Diagnosis not present

## 2021-12-05 DIAGNOSIS — Z880 Allergy status to penicillin: Secondary | ICD-10-CM

## 2021-12-05 DIAGNOSIS — Z79899 Other long term (current) drug therapy: Secondary | ICD-10-CM

## 2021-12-05 DIAGNOSIS — M7672 Peroneal tendinitis, left leg: Secondary | ICD-10-CM | POA: Diagnosis present

## 2021-12-05 DIAGNOSIS — I110 Hypertensive heart disease with heart failure: Secondary | ICD-10-CM | POA: Diagnosis present

## 2021-12-05 LAB — PROTIME-INR
INR: 1.1 (ref 0.8–1.2)
Prothrombin Time: 14.2 seconds (ref 11.4–15.2)

## 2021-12-05 LAB — BASIC METABOLIC PANEL
Anion gap: 10 (ref 5–15)
BUN: 16 mg/dL (ref 6–20)
CO2: 28 mmol/L (ref 22–32)
Calcium: 9.4 mg/dL (ref 8.9–10.3)
Chloride: 100 mmol/L (ref 98–111)
Creatinine, Ser: 1.14 mg/dL — ABNORMAL HIGH (ref 0.44–1.00)
GFR, Estimated: 55 mL/min — ABNORMAL LOW (ref >60)
Glucose, Bld: 120 mg/dL — ABNORMAL HIGH (ref 70–99)
Potassium: 3.3 mmol/L — ABNORMAL LOW (ref 3.5–5.1)
Sodium: 138 mmol/L (ref 135–145)

## 2021-12-05 LAB — CBC
HCT: 42 % (ref 36.0–46.0)
Hemoglobin: 13.4 g/dL (ref 12.0–15.0)
MCH: 28.7 pg (ref 26.0–34.0)
MCHC: 31.9 g/dL (ref 30.0–36.0)
MCV: 89.9 fL (ref 80.0–100.0)
Platelets: 283 10*3/uL (ref 150–400)
RBC: 4.67 MIL/uL (ref 3.87–5.11)
RDW: 13.6 % (ref 11.5–15.5)
WBC: 9.3 10*3/uL (ref 4.0–10.5)
nRBC: 0 % (ref 0.0–0.2)

## 2021-12-05 LAB — TROPONIN I (HIGH SENSITIVITY)
Troponin I (High Sensitivity): 16 ng/L (ref <18)
Troponin I (High Sensitivity): 36 ng/L — ABNORMAL HIGH (ref <18)
Troponin I (High Sensitivity): 53 ng/L — ABNORMAL HIGH (ref <18)

## 2021-12-05 MED ORDER — METOPROLOL TARTRATE 5 MG/5ML IV SOLN: 5.0000 mg | Freq: Three times a day (TID) | INTRAVENOUS | Status: DC

## 2021-12-05 MED ORDER — ONDANSETRON HCL 4 MG/2ML IJ SOLN: 4.0000 mg | Freq: Four times a day (QID) | INTRAMUSCULAR | Status: DC | PRN

## 2021-12-05 MED ORDER — ACETAMINOPHEN 650 MG RE SUPP: 650.0000 mg | Freq: Four times a day (QID) | RECTAL | Status: DC | PRN

## 2021-12-05 MED ORDER — PROPAFENONE HCL ER 225 MG PO CP12
225.0000 mg | ORAL_CAPSULE | Freq: Every day | ORAL | Status: DC
  Administered 2021-12-06: 225 mg via ORAL
  Filled 2021-12-05 (×4): qty 1

## 2021-12-05 MED ORDER — ONDANSETRON HCL 4 MG PO TABS: 4.0000 mg | ORAL_TABLET | Freq: Four times a day (QID) | ORAL | Status: DC | PRN

## 2021-12-05 MED ORDER — APIXABAN 5 MG PO TABS: 5.0000 mg | ORAL_TABLET | Freq: Two times a day (BID) | ORAL | Status: DC

## 2021-12-05 MED ORDER — APIXABAN 5 MG PO TABS
5.0000 mg | ORAL_TABLET | Freq: Two times a day (BID) | ORAL | Status: DC
  Administered 2021-12-06 (×2): 5 mg via ORAL
  Filled 2021-12-05 (×2): qty 1

## 2021-12-05 MED ORDER — METOPROLOL TARTRATE 5 MG/5ML IV SOLN
5.0000 mg | Freq: Once | INTRAVENOUS | Status: AC
  Administered 2021-12-05: 5 mg via INTRAVENOUS
  Filled 2021-12-05: qty 5

## 2021-12-05 MED ORDER — PROPRANOLOL HCL ER 60 MG PO CP24: 60.0000 mg | ORAL_CAPSULE | Freq: Every day | ORAL | Status: DC

## 2021-12-05 MED ORDER — ACETAMINOPHEN 325 MG PO TABS: 650.0000 mg | ORAL_TABLET | Freq: Four times a day (QID) | ORAL | Status: DC | PRN

## 2021-12-05 MED ORDER — LEVOTHYROXINE SODIUM 50 MCG PO TABS
75.0000 ug | ORAL_TABLET | Freq: Every day | ORAL | Status: DC
  Administered 2021-12-06 – 2021-12-09 (×3): 75 ug via ORAL
  Filled 2021-12-05: qty 1
  Filled 2021-12-05: qty 2
  Filled 2021-12-05: qty 1

## 2021-12-05 MED ORDER — PROPAFENONE HCL 225 MG PO TABS
225.0000 mg | ORAL_TABLET | Freq: Every day | ORAL | Status: DC
  Filled 2021-12-05: qty 1

## 2021-12-05 MED ORDER — DILTIAZEM HCL-DEXTROSE 125-5 MG/125ML-% IV SOLN (PREMIX): 5.0000 mg/h | INTRAVENOUS | Status: DC

## 2021-12-05 NOTE — Hospital Course (Signed)
Heather Dudley presented 12/05/21 with chest pain. She reports intermittent episodes of chest tightness over the last week, occurring both at rest and with exertion. Her hs Tn trend has been 36 > 53 > 184 >268 >383. Known hx Afib on Eliquis, she remains in afib with rates in the 70s-100s. Denies Cardiology following, treating NSTEMI on heparin, pt considering cardiac cath, echo pending.  Decided to go forward with cardiac cath, this is planned for tomorrow 10/16  Consultants:  Cardiology  Procedures: [Planned cardiac cath 10/16]      ASSESSMENT & PLAN:   Principal Problem:   Chest pain Active Problems:   Paroxysmal atrial fibrillation with RVR ([place])   Essential hypertension   OSA on CPAP   Obesity, Class II, BMI 35-39.9   Blood in stool   AKI (acute kidney injury) ([place])   NSTEMI (non-ST elevated myocardial infarction) ([place])   Chest pain NSTEMI Complicated by atrial fibrillation with RVR, now rate controlled Heparin gtt per cardiology  Rate control w/ metoprolol  Cardiac cath planned for tomorrow Echo pending  Cardiology following, appreciate recs   Hypertension Blood pressure is stable Continue metoprolol   Hypokalemia Repleted Follow AM BMP  OSA  Continue CPAP nightly   Acute kidney injury Mild elevation of creatinine 1.12 Repeat BMP    Bilateral lower extremity edema Echocardiogram pending    DVT prophylaxis: on heparin gtt for NSTEMI Pertinent IV fluids/nutrition: no continuous IV fluids  Central lines / invasive devices: none  Code Status: FULL CODE Family Communication: support person at bedside on AM rounds   Disposition: inpatient  TOC needs: may need HH Barriers to discharge / significant pending items: treating NSTEMI, planning for cardiac cath, penidng echo. Anticipate 1-2 more days inpatient.

## 2021-12-05 NOTE — ED Triage Notes (Signed)
Patient c/o aching and dullness across chest X1 week that has progressively gotten worse. Reports worse on right side. Also c/o elevated b/p today

## 2021-12-05 NOTE — ED Provider Notes (Signed)
[facility] Provider Note  Patient Contact: 8:39 PM (approximate)   History   Chest Pain   HPI  Heather Dudley is a 61 y.o. female with a history of cellulitis, hypertension and atrial fibrillation, presents to the emergency department with some dull anterior chest discomfort that started while patient was at work.  Patient states that she has experienced this discomfort in the past and associates it with A-fib.  She denies breathlessness or chest tightness.  She states that she has felt a little bit anxious about work.  She denies fever and chills.  She takes 100 mg of metoprolol daily and 225 mg of Rythmol at night before bed  Patient states that she has not had her nighttime dose of Rythmol but has had her daily metoprolol.  She reports that she did drink a [facility] today which was atypical for her.      Physical Exam   Triage Vital Signs: ED Triage Vitals  Enc Vitals Group     BP 12/05/21 1532 (!) 151/90     Pulse Rate 12/05/21 1532 98     Resp 12/05/21 1532 18     Temp 12/05/21 1532 98 F (36.7 C)     Temp Source 12/05/21 1532 Oral     SpO2 12/05/21 1532 96 %     Weight 12/05/21 1530 268 lb (121.6 kg)     Height 12/05/21 1530 '5\' 7"'$  (1.702 m)     Head Circumference --      Peak Flow --      Pain Score 12/05/21 1530 4     Pain Loc --      Pain Edu? --      Excl. in [place]? --     Most recent vital signs: Vitals:   12/05/21 2034 12/05/21 2100  BP:  (!) (P) 147/89  Pulse:    Resp:  (P) 16  Temp: 97.9 F (36.6 C)   SpO2:  (P) 99%     General: Alert and in no acute distress. Eyes:  PERRL. EOMI. Head: No acute traumatic findings ENT:      Nose: No congestion/rhinnorhea.      Mouth/Throat: Mucous membranes are moist. Neck: No stridor. No cervical spine tenderness to palpation. Hematological/Lymphatic/Immunilogical: No cervical lymphadenopathy. Cardiovascular:  Good peripheral perfusion Respiratory: Normal respiratory effort  without tachypnea or retractions. Lungs CTAB. Good air entry to the bases with no decreased or absent breath sounds. Gastrointestinal: Bowel sounds 4 quadrants. Soft and nontender to palpation. No guarding or rigidity. No palpable masses. No distention. No CVA tenderness. Musculoskeletal: Full range of motion to all extremities.  Neurologic:  No gross focal neurologic deficits are appreciated.  Skin:   No rash noted    ED Results / Procedures / Treatments   Labs (all labs ordered are listed, but only abnormal results are displayed) Labs Reviewed  BASIC METABOLIC PANEL - Abnormal; Notable for the following components:      Result Value   Potassium 3.3 (*)    Glucose, Bld 120 (*)    Creatinine, Ser 1.14 (*)    GFR, Estimated 55 (*)    All other components within normal limits  TROPONIN I (HIGH SENSITIVITY) - Abnormal; Notable for the following components:   Troponin I (High Sensitivity) 36 (*)    All other components within normal limits  CBC  PROTIME-INR  TROPONIN I (HIGH SENSITIVITY)  TROPONIN I (HIGH SENSITIVITY)     EKG  EKG indicates A-fib with  RVR   RADIOLOGY  I personally viewed and evaluated these images as part of my medical decision making, as well as reviewing the written report by the radiologist.  ED Provider Interpretation: No consolidations, opacities or infiltrates.  No thorax or pneumomediastinum.   PROCEDURES:  Critical Care performed: No  Procedures   MEDICATIONS ORDERED IN ED: Medications  metoprolol tartrate (LOPRESSOR) injection 5 mg (5 mg Intravenous Given 12/05/21 2122)     IMPRESSION / MDM / ASSESSMENT AND PLAN / ED COURSE  I reviewed the triage vital signs and the nursing notes.                              Assessment and plan Chest pain 61 year old female with history of A-fib, presents to the emergency department with dull anterior chest pain.  Patient was hypertensive at triage but vital signs were otherwise  reassuring.  EKG indicated atrial fibrillation with rapid ventricular response.  Patient's troponin increased from 16-36.  CBC within range.  BMP indicated mild hypokalemia and mild AKI but was otherwise consistent with her baseline.  Patient was given 5 mg of IV metoprolol and admitted to the hospitalist service for chest pain and A-fib with RVR.  Patient was accepted to the hospitalist service under the care of Dr. Posey Pronto.         FINAL CLINICAL IMPRESSION(S) / ED DIAGNOSES   Final diagnoses:  Other chest pain     Rx / DC Orders   ED Discharge Orders     None        Note:  This document was prepared using Dragon voice recognition software and may include unintentional dictation errors.   Vallarie Mare [place], PA-C 12/05/21 2146    Duffy Bruce, MD 12/13/21 [phone]

## 2021-12-05 NOTE — ED Provider Triage Note (Signed)
  Emergency Medicine Provider Triage Evaluation Note  Heather Dudley , a 61 y.o.female,  was evaluated in triage.  Pt complains of chest pain.  Patient states that she has been having a dull, aching chest pain for the past week that is progressively gotten worse.  Predominantly worse on the right side.  In addition reports elevated blood pressure.  No other injuries or illnesses recently.   Review of Systems  Positive: Chest pain Negative: Denies fever, abdominal pain, vomiting  Physical Exam   Vitals:   12/05/21 1532  BP: (!) 151/90  Pulse: 98  Resp: 18  Temp: 98 F (36.7 C)  SpO2: 96%   Gen:   Awake, no distress   Resp:  Normal effort  MSK:   Moves extremities without difficulty  Other:    Medical Decision Making  Given the patient's initial medical screening exam, the following diagnostic evaluation has been ordered. The patient will be placed in the appropriate treatment space, once one is available, to complete the evaluation and treatment. I have discussed the plan of care with the patient and I have advised the patient that an ED physician or mid-level practitioner will reevaluate their condition after the test results have been received, as the results may give them additional insight into the type of treatment they may need.    Diagnostics: Labs, CXR, EKG  Treatments: none immediately   Teodoro Spray, [place] 12/05/21 1620

## 2021-12-05 NOTE — H&P (Incomplete)
History and Physical    Chief Complaint: Chest pain   HISTORY OF PRESENT ILLNESS: Heather Dudley is an 61 y.o. female seen in the emergency room today with complaints of chest pain. Chest pain was located in the anterior chest, without shortness of breath. Patient did have some palpitations.  No radiation of the chest pain. Patient has a history of atrial fibrillation for which she takes Rythmol and metoprolol and anticoagulation with Eliquis. Chart review shows most recent echocardiogram in April 2021 showing:  1. Left ventricular ejection fraction, by estimation, is 55 to 60%. The  left ventricle has normal function. The left ventricle has no regional  wall motion abnormalities. There is mild left ventricular hypertrophy.  Left ventricular diastolic parameters  are consistent with Grade II diastolic dysfunction (pseudonormalization).  Elevated left atrial pressure.   2. Right ventricular systolic function is normal. The right ventricular  size is normal. There is moderately elevated pulmonary artery systolic  pressure.   3. The mitral valve is normal in structure. Trivial mitral valve  regurgitation. No evidence of mitral stenosis.   4. The aortic valve was not well visualized. Aortic valve regurgitation  is not visualized. No aortic stenosis is present.   5. Pulmonic valve regurgitation not well assessed.   6. The inferior vena cava is dilated in size with <50% respiratory  variability, suggesting right atrial pressure of 15 mmHg.   Pt has PMH as below: Past Medical History:  Diagnosis Date   Basal cell carcinoma of forehead    birthmark of forehead   Breast cancer (HCC)    Breast screening, unspecified    Cellulitis and abscess of trunk    Diastolic dysfunction    a. 05/2019 Echo: EF 55-60%, no rwma, mild LVH, Gr2 DD, Nl RV size/fxn. Triv MR.   Hernia    History of chemotherapy 2012   Hypertension    Lump or mass in breast    Malignant neoplasm of breast  (female), unspecified site    She underwent wide excision, mastoplasty and axillary dissection for her left breast malignancy on September 03, 2010. The primary tumor was 1 cm in diameter, and a single positive axillary node 1.3 cm in diameter. This was an ER-positive, PR slightly positive, HER-2/neu not overexpressing tumor. She tolerated her whole breast radiation without difficulty ending in late February 2013.   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 04/2019 Zio: RSR, avg HR 79. 2% PAF burden (73-175, avg 115; longest 39mins 28secs). CHA2DS2VASc = 2-->Xarelto.   Personal history of chemotherapy    Personal history of malignant neoplasm of breast    The patient underwent wide excision, mastoplasty. Dissection for a T1b, N1, M0 carcinoma left breast on September 03, 2010.The primary tumor was 1 cm in diameter, and a single positive axillary node 1.3 cm in diameter. This was an ER-positive, PR slightly positive, HER-2/neu not overexpressing tumor.  The patient completed whole breast radiation in February 2013.   Personal history of radiation therapy 2013   LEFT lumpectomy   Screening for obesity    Thyroid nodule    Review of Systems  Cardiovascular:  Positive for chest pain and palpitations.   Allergies  Allergen Reactions   Ace Inhibitors Swelling   Sernivo [Betamethasone Dipropionate] Itching   Penicillins Rash    Has  patient had a PCN reaction causing immediate rash, facial/tongue/throat swelling, SOB or lightheadedness with hypotension: Yes Has patient had a PCN reaction causing severe rash involving mucus membranes or skin necrosis: No Has patient had a PCN reaction that required hospitalization: No Has patient had a PCN reaction occurring within the last 10 years: No If all of the above answers are "NO", then may  proceed with Cephalosporin use.    Past Surgical History:  Procedure Laterality Date   ABDOMINAL HYSTERECTOMY  2011   ovaries remain   birth mark removed     BREAST CYST ASPIRATION Right    negative 01/2010   BREAST EXCISIONAL BIOPSY  08/05/2010   left breast positive 07/2010   BREAST LUMPECTOMY  2012   left breast   COLONOSCOPY  07/29/2012   normal study.   robotic tonsillectomy Lingual tonsils Bilateral 06/08/2016   THYROID SURGERY     partially removed   TUBAL LIGATION       Social History   Socioeconomic History   Marital status: Married    Spouse name: Ossie Vallery   Number of children: 3   Years of education: 16   Highest education level: Bachelor's degree (e.g., BA, AB, BS)  Occupational History   Not on file  Tobacco Use   Smoking status: Never   Smokeless tobacco: Never  Vaping Use   Vaping Use: Never used  Substance and Sexual Activity   Alcohol use: Not Currently    Comment: occ   Drug use: Never   Sexual activity: Yes    Partners: Male    Birth control/protection: Surgical  Other Topics Concern   Not on file  Social History Narrative   Not on file   Social Determinants of Health   Financial Resource Strain: Low Risk  (07/15/2021)   Overall Financial Resource Strain (CARDIA)    Difficulty of Paying Living Expenses: Not hard at all  Food Insecurity: No Food Insecurity (07/15/2021)   Hunger Vital Sign    Worried About Running Out of Food in the Last Year: Never true    [place] in the Last Year: Never true  Transportation Needs: No Transportation Needs (07/15/2021)   PRAPARE - Hydrologist (Medical): No    Lack of Transportation (Non-Medical): No  Physical Activity: Not on file  Stress: Not on file  Social Connections: Not on file      CURRENT MEDS:  Current Facility-Administered Medications (Endocrine & Metabolic):    levothyroxine (SYNTHROID) tablet 75 mcg  Current Outpatient Medications (Endocrine &  Metabolic):    levothyroxine (SYNTHROID) 75 MCG tablet, Take 75 mcg by mouth daily.  Current Facility-Administered Medications (Cardiovascular):    atorvastatin (LIPITOR) tablet 40 mg   diltiazem (CARDIZEM) 125 mg in dextrose 5% 125 mL (1 mg/mL) infusion   metoprolol tartrate (LOPRESSOR) injection 5 mg   nitroGLYCERIN (NITROSTAT) SL tablet 0.4 mg   propafenone (RYTHMOL SR) 12 hr capsule 225 mg  Current Outpatient Medications (Cardiovascular):    hydrochlorothiazide (HYDRODIURIL) 25 MG tablet, Take 1 tablet (25 mg total) by mouth daily.   metoprolol succinate (TOPROL-XL) 100 MG 24 hr tablet, Take 100 mg by mouth daily. Take with or immediately following a meal.   propafenone (RYTHMOL SR) 225 MG 12 hr capsule, Take 225 mg by mouth 2 (two) times daily.   propranolol ER (INDERAL LA) 60 MG 24 hr capsule, Take 1 capsule (60 mg total) by mouth daily. (Patient not taking: Reported on 12/05/2021)  Current Facility-Administered Medications (Analgesics):    acetaminophen (TYLENOL) tablet 650 mg **OR** acetaminophen (TYLENOL) suppository 650 mg   Current Facility-Administered Medications (Hematological):    apixaban (ELIQUIS) tablet 5 mg  Current Outpatient Medications (Hematological):    apixaban (ELIQUIS) 5 MG TABS tablet, Take 1 tablet (5 mg total) by mouth 2 (two) times daily.  Current Facility-Administered Medications (Other):    ondansetron (ZOFRAN) tablet 4 mg **OR** ondansetron (ZOFRAN) injection 4 mg  Current Outpatient Medications (Other):    B Complex-Biotin-FA (B-COMPLEX PO), Take 1 tablet by mouth daily.   Multiple Vitamin (MULTIVITAMIN) tablet, Take 1 tablet by mouth daily.   LORazepam (ATIVAN) 0.5 MG tablet, TAKE 1 TABLET BY MOUTH EVERY 8 HOURS AS NEEDED FOR ANXIETY. (Patient not taking: Reported on 12/05/2021)   potassium chloride SA (KLOR-CON M) 20 MEQ tablet, Take 1 tablet (20 mEq total) by mouth daily. (Patient not taking: Reported on 12/05/2021)    ED Course: Pt in Ed  alert awake oriented afebrile hypertensive. Vitals:   12/06/21 0325 12/06/21 0330 12/06/21 0359 12/06/21 0400  BP: 126/79   112/77  Pulse: 69 69 99   Resp: 20   (!) 23  Temp: 97.8 F (36.6 C)     TempSrc: Oral     SpO2: 95%   96%  Weight:      Height:       No intake/output data recorded. SpO2: 96 % Blood work in ed shows hypokalemia with a potassium of 3.3 glucose 120 acute kidney injury with a creatinine of 1.14 and GFR 55. Initial troponin normal at 16 and repeat troponin of 36.  CBC without differential is normal. Elevated glucose at 120 and a previous A1c in May 2023 is 6.1.  Results for orders placed or performed during the hospital encounter of 12/05/21 (from the past 48 hour(s))  Basic metabolic panel     Status: Abnormal   Collection Time: 12/05/21  3:33 PM  Result Value Ref Range   Sodium 138 135 - 145 mmol/L   Potassium 3.3 (L) 3.5 - 5.1 mmol/L   Chloride 100 98 - 111 mmol/L   CO2 28 22 - 32 mmol/L   Glucose, Bld 120 (H) 70 - 99 mg/dL    Comment: Glucose reference range applies only to samples taken after fasting for at least 8 hours.   BUN 16 6 - 20 mg/dL   Creatinine, Ser 1.14 (H) 0.44 - 1.00 mg/dL   Calcium 9.4 8.9 - 10.3 mg/dL   GFR, Estimated 55 (L) >60 mL/min    Comment: (NOTE) Calculated using the CKD-EPI Creatinine Equation (2021)    Anion gap 10 5 - 15    Comment: Performed at [facility], [address] [place], [place] [address]  CBC     Status: None   Collection Time: 12/05/21  3:33 PM  Result Value Ref Range   WBC 9.3 4.0 - 10.5 K/uL   RBC 4.67 3.87 - 5.11 MIL/uL   Hemoglobin 13.4 12.0 - 15.0 g/dL   HCT 42.0 36.0 - 46.0 %   MCV 89.9 80.0 - 100.0 fL   MCH 28.7 26.0 - 34.0 pg   MCHC 31.9 30.0 - 36.0 g/dL   RDW 13.6 11.5 - 15.5 %   Platelets 283 150 - 400 K/uL   nRBC 0.0 0.0 - 0.2 %    Comment: Performed at [facility], [address] [place], [place] [address]  Troponin I (High Sensitivity)     Status: None  Collection Time: 12/05/21  3:33 PM  Result Value Ref Range   Troponin I (High Sensitivity) 16 <18 ng/L    Comment: (NOTE) Elevated high sensitivity troponin I (hsTnI) values and significant  changes across serial measurements may suggest ACS but many other  chronic and acute conditions are known to elevate hsTnI results.  Refer to the "Links" section for chest pain algorithms and additional  guidance. Performed at [facility], [address] [place], [place] [address]   Protime-INR (order if Patient is taking Coumadin / Warfarin)     Status: None   Collection Time: 12/05/21  3:33 PM  Result Value Ref Range   Prothrombin Time 14.2 11.4 - 15.2 seconds   INR 1.1 0.8 - 1.2    Comment: (NOTE) INR goal varies based on device and disease states. Performed at [facility], [place], [place] [address]   Troponin I (High Sensitivity)     Status: Abnormal   Collection Time: 12/05/21  8:25 PM  Result Value Ref Range   Troponin I (High Sensitivity) 36 (H) <18 ng/L    Comment: READ BACK AND VERIFIED WITH TRAVIS DEETH $RemoveBefo'@2103'[id number]$  ON 12/05/21 SKL (NOTE) Elevated high sensitivity troponin I (hsTnI) values and significant  changes across serial measurements may suggest ACS but many other  chronic and acute conditions are known to elevate hsTnI results.  Refer to the "Links" section for chest pain algorithms and additional  guidance. Performed at [facility], [place], [place] [address]   Troponin I (High Sensitivity)     Status: Abnormal   Collection Time: 12/05/21  9:42 PM  Result Value Ref Range   Troponin I (High Sensitivity) 53 (H) <18 ng/L    Comment: (NOTE) Elevated high sensitivity troponin I (hsTnI) values and significant  changes across serial measurements may suggest ACS but many other  chronic and acute conditions are known to elevate hsTnI results.  Refer to the "Links" section for chest pain algorithms and additional   guidance. Performed at [facility], [address] [place], [place] [address]   Troponin I (High Sensitivity)     Status: Abnormal   Collection Time: 12/06/21  3:35 AM  Result Value Ref Range   Troponin I (High Sensitivity) 184 (HH) <18 ng/L    Comment: CRITICAL RESULT CALLED TO, READ BACK BY AND VERIFIED WITH MCKENZIE WALLACE $RemoveBeforeDE'@[id number]$  ON 12/06/21 SKL (NOTE) Elevated high sensitivity troponin I (hsTnI) values and significant  changes across serial measurements may suggest ACS but many other  chronic and acute conditions are known to elevate hsTnI results.  Refer to the "Links" section for chest pain algorithms and additional  guidance. Performed at [facility], [address] [place], [place] [address]      In Ed pt received  Meds ordered this encounter  Medications   DISCONTD: propafenone (RYTHMOL) tablet 225 mg   metoprolol tartrate (LOPRESSOR) injection 5 mg   apixaban (ELIQUIS) tablet 5 mg   levothyroxine (SYNTHROID) tablet 75 mcg   propafenone (RYTHMOL SR) 12 hr capsule 225 mg   DISCONTD: propranolol ER (INDERAL LA) 24 hr capsule 60 mg   DISCONTD: apixaban (ELIQUIS) tablet 5 mg   diltiazem (CARDIZEM) 125 mg in dextrose 5% 125 mL (1 mg/mL) infusion   OR Linked Order Group    acetaminophen (TYLENOL) tablet 650 mg    acetaminophen (TYLENOL) suppository 650 mg   OR Linked Order Group    ondansetron (ZOFRAN) tablet 4 mg  ondansetron (ZOFRAN) injection 4 mg   metoprolol tartrate (LOPRESSOR) injection 5 mg    GIVE PRN FOR A.FIB RVR.   nitroGLYCERIN (NITROSTAT) SL tablet 0.4 mg   atorvastatin (LIPITOR) tablet 40 mg    Unresulted Labs (From admission, onward)     Start     Ordered   12/06/21 0500  HIV Antibody (routine testing w rflx)  Once,   R        12/06/21 0500   12/05/21 2253  Magnesium  Add-on,   AD        12/05/21 2253   12/05/21 2253  T4, free  Once,   URGENT        12/05/21 2253   12/05/21 2253  TSH  Once,   URGENT        12/05/21  2253             Admission Imaging : DG Chest 2 View  Result Date: 12/05/2021 CLINICAL DATA:  Chest pain EXAM: CHEST - 2 VIEW COMPARISON:  07/12/2020 FINDINGS: Transverse diameter of heart is in the upper limits of normal. Thoracic aorta is ectatic. There are no signs of pulmonary edema or focal pulmonary consolidation. There is no pleural effusion or pneumothorax. IMPRESSION: No active cardiopulmonary disease. Electronically Signed   By: Elmer Picker M.D.   On: 12/05/2021 16:08      Physical Examination: Vitals:   12/06/21 0325 12/06/21 0330 12/06/21 0359 12/06/21 0400  BP: 126/79   112/77  Pulse: 69 69 99   Temp: 97.8 F (36.6 C)     Resp: 20   (!) 23  Height:      Weight:      SpO2: 95%   96%  TempSrc: Oral     BMI (Calculated):       Physical Exam Vitals and nursing note reviewed.  Constitutional:      General: She is not in acute distress.    Appearance: Normal appearance. She is not ill-appearing, toxic-appearing or diaphoretic.  HENT:     Head: Normocephalic and atraumatic.     Right Ear: Hearing and external ear normal.     Left Ear: Hearing and external ear normal.     Nose: Nose normal. No nasal deformity.     Mouth/Throat:     Lips: Pink.     Mouth: Mucous membranes are moist.     Tongue: No lesions.     Pharynx: Oropharynx is clear.  Eyes:     Extraocular Movements: Extraocular movements intact.     Pupils: Pupils are equal, round, and reactive to light.  Cardiovascular:     Rate and Rhythm: Normal rate. Rhythm irregular.     Pulses: Normal pulses.     Heart sounds: Normal heart sounds.  Pulmonary:     Effort: Pulmonary effort is normal.     Breath sounds: Normal breath sounds.  Abdominal:     General: Bowel sounds are normal. There is no distension.     Palpations: Abdomen is soft. There is no mass.     Tenderness: There is no abdominal tenderness. There is no guarding.     Hernia: No hernia is present.  Musculoskeletal:     Right  lower leg: No edema.     Left lower leg: No edema.  Skin:    General: Skin is warm.  Neurological:     General: No focal deficit present.     Mental Status: She is alert and oriented to person, place, and time.  Cranial Nerves: Cranial nerves 2-12 are intact.     Motor: Motor function is intact.  Psychiatric:        Attention and Perception: Attention normal.        Mood and Affect: Mood normal.        Speech: Speech normal.        Behavior: Behavior normal. Behavior is cooperative.        Cognition and Memory: Cognition normal.      Assessment and Plan: * Chest pain Patient present chest discomfort found to be in atrial relation. Patient has a history of atrial flutter patient that is chronic. Suspect due to electrolyte imbalance, OSA treatment. Differentials also include GERD. Patient's troponins have gone from 16-36-53 and next one is 184. We will cont eliquis and statin therapy.  No complaints of chest pain currently. EKG today shows EKG shows atrial fibrillation at 115 QT of 392 ST depression in lateral leads, Q waves in lead V2 are not significant.  Previous EKG shows sinus rhythm at 63.  Paroxysmal atrial fibrillation with RVR (HCC) Currently irregular but stable.  Cont eliquis , metoprolol, Rythmol.    Essential hypertension Vitals:   12/05/21 1532 12/05/21 2030 12/05/21 2100 12/05/21 2130  BP: (!) 151/90 (!) 157/98 (!) (P) 147/89 (!) 152/82   12/05/21 2200 12/06/21 0100 12/06/21 0130 12/06/21 0325  BP: (!) 158/108 130/88 (!) 128/92 126/79  Discussed with patient extensively about diet weight loss and salt restriction.  Fluctuation and concern of orthostatic hypotension.  We will obtain orthostatic vitals. We will resume patient's diltiazem and full dose metoprolol once orthostatics are obtained.    OSA on CPAP Discussed with patient the importance of compliance with the CPAP machine and its effect on right-sided heart failure or pulmonary hypertension and  pulmonary embolism.  Patient states that she understands and she will be more compliant and would be interested in an oral appliance.  AKI (acute kidney injury) [facility]) Lab Results  Component Value Date   CREATININE 1.14 (H) 12/05/2021   CREATININE 0.99 07/15/2021   CREATININE 1.06 (H) 05/13/2021  Mild kidney dysfunction. Patient has acute kidney injury we will hold diuretic therapy. We will hold any nephrotoxic agents contrast and studies.    Blood in stool History of blood in stool none recently. Follow H&H and IV PPI therapy.  Obesity, Class II, BMI 35-39.9 Patient has reduced weight.   In courage patient for healthier diet.   DVT prophylaxis:  Eliquis.   Code Status:  Full Code.    Family Communication:  Timmers,Ossie (Spouse)  [phone] (Mobile   Disposition Plan:  Home    Consults called:  Cardiology  Admission status: Observation   Unit/ Expected LOS: Progressive.   Para Skeans MD [facility]  6 PM- 2 AM. Please contact me via secure Chat 6 PM-2 AM. [phone] ( Pager ) To contact the [facility] Attending or Consulting provider [place] or covering provider during after hours [place], for this patient.   Check the care team in [facility] and look for a) attending/consulting [facility] provider listed and b) the [facility] team listed Log into [email or web address] and use [place]'s universal password to access. If you do not have the password, please contact the hospital operator. Locate the [facility] provider you are looking for under [facility] and page to a number that you can be directly reached. If you still have difficulty reaching the provider, please page the [facility] (Director on Call) for the Hospitalists listed on  [email or web address] for assistance. [email or web address] 12/06/2021, 4:12 AM

## 2021-12-06 ENCOUNTER — Other Ambulatory Visit: Payer: Self-pay

## 2021-12-06 DIAGNOSIS — Z853 Personal history of malignant neoplasm of breast: Secondary | ICD-10-CM | POA: Diagnosis not present

## 2021-12-06 DIAGNOSIS — R072 Precordial pain: Secondary | ICD-10-CM | POA: Diagnosis not present

## 2021-12-06 DIAGNOSIS — Z85828 Personal history of other malignant neoplasm of skin: Secondary | ICD-10-CM | POA: Diagnosis not present

## 2021-12-06 DIAGNOSIS — I10 Essential (primary) hypertension: Secondary | ICD-10-CM

## 2021-12-06 DIAGNOSIS — Z801 Family history of malignant neoplasm of trachea, bronchus and lung: Secondary | ICD-10-CM | POA: Diagnosis not present

## 2021-12-06 DIAGNOSIS — G4733 Obstructive sleep apnea (adult) (pediatric): Secondary | ICD-10-CM | POA: Diagnosis present

## 2021-12-06 DIAGNOSIS — E876 Hypokalemia: Secondary | ICD-10-CM | POA: Diagnosis present

## 2021-12-06 DIAGNOSIS — Z8249 Family history of ischemic heart disease and other diseases of the circulatory system: Secondary | ICD-10-CM | POA: Diagnosis not present

## 2021-12-06 DIAGNOSIS — I5031 Acute diastolic (congestive) heart failure: Secondary | ICD-10-CM | POA: Diagnosis not present

## 2021-12-06 DIAGNOSIS — I480 Paroxysmal atrial fibrillation: Secondary | ICD-10-CM | POA: Diagnosis present

## 2021-12-06 DIAGNOSIS — E785 Hyperlipidemia, unspecified: Secondary | ICD-10-CM | POA: Diagnosis present

## 2021-12-06 DIAGNOSIS — I110 Hypertensive heart disease with heart failure: Secondary | ICD-10-CM | POA: Diagnosis present

## 2021-12-06 DIAGNOSIS — I2510 Atherosclerotic heart disease of native coronary artery without angina pectoris: Secondary | ICD-10-CM | POA: Diagnosis present

## 2021-12-06 DIAGNOSIS — Z7901 Long term (current) use of anticoagulants: Secondary | ICD-10-CM | POA: Diagnosis not present

## 2021-12-06 DIAGNOSIS — Z90710 Acquired absence of both cervix and uterus: Secondary | ICD-10-CM | POA: Diagnosis not present

## 2021-12-06 DIAGNOSIS — Z6841 Body Mass Index (BMI) 40.0 and over, adult: Secondary | ICD-10-CM | POA: Diagnosis not present

## 2021-12-06 DIAGNOSIS — Z923 Personal history of irradiation: Secondary | ICD-10-CM | POA: Diagnosis not present

## 2021-12-06 DIAGNOSIS — N179 Acute kidney failure, unspecified: Secondary | ICD-10-CM | POA: Diagnosis present

## 2021-12-06 DIAGNOSIS — I214 Non-ST elevation (NSTEMI) myocardial infarction: Secondary | ICD-10-CM

## 2021-12-06 DIAGNOSIS — I2489 Other forms of acute ischemic heart disease: Secondary | ICD-10-CM | POA: Diagnosis present

## 2021-12-06 DIAGNOSIS — Z800 Family history of malignant neoplasm of digestive organs: Secondary | ICD-10-CM | POA: Diagnosis not present

## 2021-12-06 DIAGNOSIS — R0789 Other chest pain: Secondary | ICD-10-CM | POA: Diagnosis present

## 2021-12-06 DIAGNOSIS — Z8041 Family history of malignant neoplasm of ovary: Secondary | ICD-10-CM | POA: Diagnosis not present

## 2021-12-06 DIAGNOSIS — I4892 Unspecified atrial flutter: Secondary | ICD-10-CM | POA: Diagnosis present

## 2021-12-06 DIAGNOSIS — K921 Melena: Secondary | ICD-10-CM | POA: Diagnosis present

## 2021-12-06 DIAGNOSIS — H53459 Other localized visual field defect, unspecified eye: Secondary | ICD-10-CM | POA: Diagnosis present

## 2021-12-06 DIAGNOSIS — I5033 Acute on chronic diastolic (congestive) heart failure: Secondary | ICD-10-CM | POA: Diagnosis present

## 2021-12-06 DIAGNOSIS — Z9221 Personal history of antineoplastic chemotherapy: Secondary | ICD-10-CM | POA: Diagnosis not present

## 2021-12-06 LAB — TSH: TSH: 5.447 u[IU]/mL — ABNORMAL HIGH (ref 0.350–4.500)

## 2021-12-06 LAB — BASIC METABOLIC PANEL
Anion gap: 6 (ref 5–15)
BUN: 19 mg/dL (ref 6–20)
CO2: 29 mmol/L (ref 22–32)
Calcium: 9 mg/dL (ref 8.9–10.3)
Chloride: 102 mmol/L (ref 98–111)
Creatinine, Ser: 1.12 mg/dL — ABNORMAL HIGH (ref 0.44–1.00)
GFR, Estimated: 57 mL/min — ABNORMAL LOW (ref >60)
Glucose, Bld: 113 mg/dL — ABNORMAL HIGH (ref 70–99)
Potassium: 3.5 mmol/L (ref 3.5–5.1)
Sodium: 137 mmol/L (ref 135–145)

## 2021-12-06 LAB — APTT
aPTT: 28 seconds (ref 24–36)
aPTT: 42 seconds — ABNORMAL HIGH (ref 24–36)

## 2021-12-06 LAB — MAGNESIUM: Magnesium: 1.9 mg/dL (ref 1.7–2.4)

## 2021-12-06 LAB — T4, FREE: Free T4: 0.65 ng/dL (ref 0.61–1.12)

## 2021-12-06 LAB — TROPONIN I (HIGH SENSITIVITY)
Troponin I (High Sensitivity): 184 ng/L (ref <18)
Troponin I (High Sensitivity): 268 ng/L (ref <18)
Troponin I (High Sensitivity): 383 ng/L (ref <18)

## 2021-12-06 LAB — HIV ANTIBODY (ROUTINE TESTING W REFLEX): HIV Screen 4th Generation wRfx: NONREACTIVE

## 2021-12-06 MED ORDER — ATORVASTATIN CALCIUM 20 MG PO TABS
40.0000 mg | ORAL_TABLET | Freq: Every day | ORAL | Status: DC
  Administered 2021-12-06 – 2021-12-09 (×3): 40 mg via ORAL
  Filled 2021-12-06 (×3): qty 2

## 2021-12-06 MED ORDER — HEPARIN (PORCINE) 25000 UT/250ML-% IV SOLN
1750.0000 [IU]/h | INTRAVENOUS | Status: DC
  Administered 2021-12-06: 1250 [IU]/h via INTRAVENOUS
  Administered 2021-12-07: 1550 [IU]/h via INTRAVENOUS
  Filled 2021-12-06 (×3): qty 250

## 2021-12-06 MED ORDER — HEPARIN BOLUS VIA INFUSION
4000.0000 [IU] | Freq: Once | INTRAVENOUS | Status: AC
  Administered 2021-12-06: 4000 [IU] via INTRAVENOUS
  Filled 2021-12-06: qty 4000

## 2021-12-06 MED ORDER — METOPROLOL TARTRATE 25 MG PO TABS
25.0000 mg | ORAL_TABLET | Freq: Four times a day (QID) | ORAL | Status: DC
  Administered 2021-12-06 – 2021-12-08 (×7): 25 mg via ORAL
  Filled 2021-12-06 (×7): qty 1

## 2021-12-06 MED ORDER — NITROGLYCERIN 0.4 MG SL SUBL: 0.4000 mg | SUBLINGUAL_TABLET | SUBLINGUAL | Status: DC | PRN

## 2021-12-06 MED ORDER — ASPIRIN 81 MG PO TBEC
81.0000 mg | DELAYED_RELEASE_TABLET | Freq: Every day | ORAL | Status: DC
  Administered 2021-12-06 – 2021-12-09 (×3): 81 mg via ORAL
  Filled 2021-12-06 (×3): qty 1

## 2021-12-06 NOTE — Consult Note (Signed)
Cardiology Consultation   Patient ID: Heather Dudley MRN: [id number]; DOB: 04-14-1960  Admit date: 12/05/2021 Date of Consult: 12/06/2021  PCP:  Glean Hess, MD   [facility] Cardiologist:  Heather Rogue, MD        Patient Profile:   Heather Dudley is a 61 y.o. female with a hx of atrial fibrillation who is being seen 12/06/2021 for the evaluation of atrial fibrillation, chest pain, elevated troponin at the request of Dr. Darrick Dudley.  History of Present Illness:   Heather Dudley presented 12/05/21 with chest pain. She reports intermittent episodes of chest tightness over the last week, occurring both at rest and with exertion. The symptoms were not limiting, so she did not seek medical attention. Yesterday she developed worsening chest tightness, starting on the right side and spreading across her anterior chest. Denies associated symptoms, nothing seemed to make it better or worse. It lasted several hours, and she noted palpitations as well. She took her evening medications early to try to resolve the symptoms. When it did not resolve, she presented to the ER for further evaluation. Vitals on arrival in triage were BP 151/90, HR 98.   The pain resolved after a time yesterday. However, overnight when she got up to change rooms in the ER< she had severe squeezing central chest pain associated with clamminess, diaphoresis, and near syncope. This resolved with resting. She has not had any chest discomfort since that time.  Her hs Tn trend has been 36 > 53 > 184 >268 >383.  She remains in afib with rates in the 70s-100s.Denies any recent changes or recent infectious symptoms.   Past Medical History:  Diagnosis Date   Basal cell carcinoma of forehead    birthmark of forehead   Breast cancer (HCC)    Breast screening, unspecified    Cellulitis and abscess of trunk    Diastolic dysfunction    a. 05/2019 Echo: EF 55-60%, no rwma, mild LVH, Gr2 DD, Nl RV  size/fxn. Triv MR.   Hernia    History of chemotherapy 2012   Hypertension    Lump or mass in breast    Malignant neoplasm of breast (female), unspecified site    She underwent wide excision, mastoplasty and axillary dissection for her left breast malignancy on September 03, 2010. The primary tumor was 1 cm in diameter, and a single positive axillary node 1.3 cm in diameter. This was an ER-positive, PR slightly positive, HER-2/neu not overexpressing tumor. She tolerated her whole breast radiation without difficulty ending in late February 2013.   Malignant neoplasm of upper-outer quadrant of female breast ([place])    Neoplasm of uncertain behavior of connective and other soft tissue    Obesity, unspecified    PAF (paroxysmal atrial fibrillation) ([place]) 05/04/2017   a. 04/2019 Zio: RSR, avg HR 79. 2% PAF burden (73-175, avg 115; longest 75mns 28secs). CHA2DS2VASc = 2-->Xarelto.   Personal history of chemotherapy    Personal history of malignant neoplasm of breast    The patient underwent wide excision, mastoplasty. Dissection for a T1b, N1, M0 carcinoma left breast on September 03, 2010.The primary tumor was 1 cm in diameter, and a single positive axillary node 1.3 cm in diameter. This was an ER-positive, PR slightly positive, HER-2/neu not overexpressing tumor.  The patient completed whole breast radiation in February 2013.   Personal history of radiation therapy 2013   LEFT lumpectomy   Screening for obesity    Thyroid nodule  Past Surgical History:  Procedure Laterality Date   ABDOMINAL HYSTERECTOMY  2011   ovaries remain   birth mark removed     BREAST CYST ASPIRATION Right    negative 01/2010   BREAST EXCISIONAL BIOPSY  08/05/2010   left breast positive 07/2010   BREAST LUMPECTOMY  2012   left breast   COLONOSCOPY  07/29/2012   normal study.   robotic tonsillectomy Lingual tonsils Bilateral 06/08/2016   THYROID SURGERY     partially removed   TUBAL LIGATION       Home Medications:   Prior to Admission medications   Medication Sig Start Date End Date Taking? Authorizing Provider  apixaban (ELIQUIS) 5 MG TABS tablet Take 1 tablet (5 mg total) by mouth 2 (two) times daily. 03/17/21  Yes Gollan, Kathlene November, MD  B Complex-Biotin-FA (B-COMPLEX PO) Take 1 tablet by mouth daily.   Yes [provider]  hydrochlorothiazide (HYDRODIURIL) 25 MG tablet Take 1 tablet (25 mg total) by mouth daily. 05/26/21  Yes Minna Merritts, MD  levothyroxine (SYNTHROID) 75 MCG tablet Take 75 mcg by mouth daily. 10/10/20  Yes [provider]  metoprolol succinate (TOPROL-XL) 100 MG 24 hr tablet Take 100 mg by mouth daily. Take with or immediately following a meal.   Yes [provider]  Multiple Vitamin (MULTIVITAMIN) tablet Take 1 tablet by mouth daily.   Yes [provider]  propafenone (RYTHMOL SR) 225 MG 12 hr capsule Take 225 mg by mouth 2 (two) times daily.   Yes [provider]  LORazepam (ATIVAN) 0.5 MG tablet TAKE 1 TABLET BY MOUTH EVERY 8 HOURS AS NEEDED FOR ANXIETY. Patient not taking: Reported on 12/05/2021 03/24/21   Glean Hess, MD  potassium chloride SA (KLOR-CON M) 20 MEQ tablet Take 1 tablet (20 mEq total) by mouth daily. Patient not taking: Reported on 12/05/2021 05/26/21   Minna Merritts, MD  propranolol ER (INDERAL LA) 60 MG 24 hr capsule Take 1 capsule (60 mg total) by mouth daily. Patient not taking: Reported on 12/05/2021 10/13/21   Minna Merritts, MD    Inpatient Medications: Scheduled Meds:  aspirin EC  81 mg Oral Daily   atorvastatin  40 mg Oral Daily   heparin  4,000 Units Intravenous Once   levothyroxine  75 mcg Oral Q0600   metoprolol tartrate  25 mg Oral Q6H   Continuous Infusions:  heparin     PRN Meds: acetaminophen **OR** acetaminophen, nitroGLYCERIN, ondansetron **OR** ondansetron (ZOFRAN) IV  Allergies:    Allergies  Allergen Reactions   Ace Inhibitors Swelling   Sernivo [Betamethasone Dipropionate]  Itching   Penicillins Rash    Has patient had a PCN reaction causing immediate rash, facial/tongue/throat swelling, SOB or lightheadedness with hypotension: Yes Has patient had a PCN reaction causing severe rash involving mucus membranes or skin necrosis: No Has patient had a PCN reaction that required hospitalization: No Has patient had a PCN reaction occurring within the last 10 years: No If all of the above answers are "NO", then may proceed with Cephalosporin use.    Social History:   Social History   Socioeconomic History   Marital status: Married    Spouse name: Ossie Bas   Number of children: 3   Years of education: 16   Highest education level: Bachelor's degree (e.g., BA, AB, BS)  Occupational History   Not on file  Tobacco Use   Smoking status: Never   Smokeless tobacco: Never  Vaping Use  Vaping Use: Never used  Substance and Sexual Activity   Alcohol use: Not Currently    Comment: occ   Drug use: Never   Sexual activity: Yes    Partners: Male    Birth control/protection: Surgical  Other Topics Concern   Not on file  Social History Narrative   Not on file   Social Determinants of Health   Financial Resource Strain: Low Risk  (07/15/2021)   Overall Financial Resource Strain (CARDIA)    Difficulty of Paying Living Expenses: Not hard at all  Food Insecurity: No Food Insecurity (07/15/2021)   Hunger Vital Sign    Worried About Running Out of Food in the Last Year: Never true    Ran Out of Food in the Last Year: Never true  Transportation Needs: No Transportation Needs (07/15/2021)   PRAPARE - Hydrologist (Medical): No    Lack of Transportation (Non-Medical): No  Physical Activity: Not on file  Stress: Not on file  Social Connections: Not on file  Intimate Partner Violence: Not At Risk (07/15/2021)   Humiliation, Afraid, Rape, and Kick questionnaire    Fear of Current or Ex-Partner: No    Emotionally Abused: No     Physically Abused: No    Sexually Abused: No    Family History:    Family History  Problem Relation Age of Onset   Colon cancer Other    Ovarian cancer Other    Cancer Mother        lung ca   Cancer Maternal Uncle        lung ca   Cancer Paternal Aunt        ovarian ca   Cancer Cousin        female ca   Heart failure Father    Breast cancer Neg Hx      ROS:  Please see the history of present illness.  Constitutional: Negative for chills, fever, night sweats, unintentional weight loss  HENT: Negative for ear pain and hearing loss.   Eyes: Negative for loss of vision and eye pain.  Respiratory: Negative for cough, sputum, wheezing.   Cardiovascular: See HPI. Gastrointestinal: Negative for abdominal pain, melena, and hematochezia.  Genitourinary: Negative for dysuria and hematuria.  Musculoskeletal: Negative for falls and myalgias.  Skin: Negative for itching and rash.  Neurological: Negative for focal weakness, focal sensory changes and loss of consciousness.  Endo/Heme/Allergies: Does bruise/bleed easily.   All other ROS reviewed and negative.     Physical Exam/Data:   Vitals:   12/06/21 1330 12/06/21 1400 12/06/21 1430 12/06/21 1505  BP: 139/76 135/80 129/75 133/88  Pulse: 78 (!) 163 (!) 107 98  Resp: (!) 21 (!) 27 (!) 30   Temp:      TempSrc:      SpO2: 95% 95% 97%   Weight:      Height:       No intake or output data in the 24 hours ending 12/06/21 1523    12/05/2021    3:30 PM 09/23/2021    2:46 PM 07/15/2021    9:47 AM  Last 3 Weights  Weight (lbs) 268 lb 272 lb 12.8 oz 267 lb  Weight (kg) 121.564 kg 123.741 kg 121.11 kg     Body mass index is 41.97 kg/m.  General:  Well nourished, well developed, in no acute distress HEENT: normal Neck: no JVD Vascular: No carotid bruits; Distal pulses 2+ bilaterally Cardiac:  irregularly irregular S1, S2; no  murmur  Lungs:  clear to auscultation bilaterally, no wheezing, rhonchi or rales  Abd: soft, nontender, no  hepatomegaly  Ext: trivial bilateral LE edema Musculoskeletal:  No deformities, BUE and BLE strength normal and equal Skin: warm and dry  Neuro:  CNs 2-12 intact, no focal abnormalities noted Psych:  Normal affect   EKG:  The EKG was personally reviewed and demonstrates:  atrial fibrillation at 70 bpm. Prior ECG with afib at 115 bpm and nonspecific st changes Telemetry:  Telemetry was personally reviewed and demonstrates:  atrial fibrillation, rates 70s-100s  Relevant CV Studies: Echo 06/13/19 1. Left ventricular ejection fraction, by estimation, is 55 to 60%. The  left ventricle has normal function. The left ventricle has no regional  wall motion abnormalities. There is mild left ventricular hypertrophy.  Left ventricular diastolic parameters  are consistent with Grade II diastolic dysfunction (pseudonormalization).  Elevated left atrial pressure.   2. Right ventricular systolic function is normal. The right ventricular  size is normal. There is moderately elevated pulmonary artery systolic  pressure.   3. The mitral valve is normal in structure. Trivial mitral valve  regurgitation. No evidence of mitral stenosis.   4. The aortic valve was not well visualized. Aortic valve regurgitation  is not visualized. No aortic stenosis is present.   5. Pulmonic valve regurgitation not well assessed.   6. The inferior vena cava is dilated in size with <50% respiratory  variability, suggesting right atrial pressure of 15 mmHg.   Laboratory Data:  High Sensitivity Troponin:   Recent Labs  Lab 12/05/21 2025 12/05/21 2142 12/06/21 0335 12/06/21 0558 12/06/21 1232  TROPONINIHS 36* 53* 184* 268* 383*     Chemistry Recent Labs  Lab 12/05/21 1533 12/06/21 0335 12/06/21 1232  NA 138  --  137  K 3.3*  --  3.5  CL 100  --  102  CO2 28  --  29  GLUCOSE 120*  --  113*  BUN 16  --  19  CREATININE 1.14*  --  1.12*  CALCIUM 9.4  --  9.0  MG  --  1.9  --   GFRNONAA 55*  --  57*  ANIONGAP  10  --  6    No results for input(s): "PROT", "ALBUMIN", "AST", "ALT", "ALKPHOS", "BILITOT" in the last 168 hours. Lipids No results for input(s): "CHOL", "TRIG", "HDL", "LABVLDL", "LDLCALC", "CHOLHDL" in the last 168 hours.  Hematology Recent Labs  Lab 12/05/21 1533  WBC 9.3  RBC 4.67  HGB 13.4  HCT 42.0  MCV 89.9  MCH 28.7  MCHC 31.9  RDW 13.6  PLT 283   Thyroid  Recent Labs  Lab 12/06/21 0335  TSH 5.447*  FREET4 0.65    BNPNo results for input(s): "BNP", "PROBNP" in the last 168 hours.  DDimer No results for input(s): "DDIMER" in the last 168 hours.   Radiology/Studies:  DG Chest 2 View  Result Date: 12/05/2021 CLINICAL DATA:  Chest pain EXAM: CHEST - 2 VIEW COMPARISON:  07/12/2020 FINDINGS: Transverse diameter of heart is in the upper limits of normal. Thoracic aorta is ectatic. There are no signs of pulmonary edema or focal pulmonary consolidation. There is no pleural effusion or pneumothorax. IMPRESSION: No active cardiopulmonary disease. Electronically Signed   By: Elmer Picker M.D.   On: 12/05/2021 16:08     Assessment and Plan:   NSTEMI -progressive chest pain over the last week -hs Tn continues to rise, consistent with ACS -risk factors of hypertension, obesity, hyperlipidemia -  will stop apixaban, start heparin -stop propafenone, will use metoprolol for rate control -we discussed cath today. She would like to think about it. Will start with echo -start aspirin -continue atorvastatin  Paroxysmal atrial fibrillation -rate controlled currently -has not had significant RVR to explain her symptoms or troponin elevation -transitioning apixaban to heparin  Hypertension -has been elevated recently -using metoprolol as above -has not been controlled on HCTZ at home   Risk Assessment/Risk Scores:     TIMI Risk Score for Unstable Angina or Non-ST Elevation MI:   The patient's TIMI risk score is 4, which indicates a 20% risk of all cause mortality,  new or recurrent myocardial infarction or need for urgent revascularization in the next 14 days.    CHA2DS2-VASc Score = 2   This indicates a 2.2% annual risk of stroke. The patient's score is based upon: CHF History: 0 HTN History: 1 Diabetes History: 0 Stroke History: 0 Vascular Disease History: 0 Age Score: 0 Gender Score: 1        For questions or updates, please contact [place] Please consult [email or web address] for contact info under    Signed, Buford Dresser, MD  12/06/2021 3:23 PM

## 2021-12-06 NOTE — Progress Notes (Signed)
       CROSS COVER NOTE  NAME: Heather Dudley MRN: [id number] DOB : 1961/01/04    Date of Service   12/06/2021  HPI/Events of Note   Patient admitted with chest pain. Troponin reported to me 184. Trend 12-36-53-184; on eliquis and statin Initial Ekg Afib with RVR.   Assessment and  Interventions   Assessment: No current chest pain or shortness of breath reported.  Repeat EKG No acute ST T wave changes a fib, rate controlled Plan: Continue to monitor on tele Repeat trop and ekg 0730 10/14

## 2021-12-06 NOTE — Consult Note (Addendum)
ANTICOAGULATION CONSULT NOTE - Consult  Pharmacy Consult for heparin gtt (PTA DOAC for AFib) Indication: chest pain/ACS  Allergies  Allergen Reactions   Ace Inhibitors Swelling   Sernivo [Betamethasone Dipropionate] Itching   Penicillins Rash    Has patient had a PCN reaction causing immediate rash, facial/tongue/throat swelling, SOB or lightheadedness with hypotension: Yes Has patient had a PCN reaction causing severe rash involving mucus membranes or skin necrosis: No Has patient had a PCN reaction that required hospitalization: No Has patient had a PCN reaction occurring within the last 10 years: No If all of the above answers are "NO", then may proceed with Cephalosporin use.    Patient Measurements: Height: '5\' 7"'$  (170.2 cm) Weight: 121.6 kg (268 lb) IBW/kg (Calculated) : 61.6 Heparin Dosing Weight: 90.4kg  Vital Signs: Temp: 97.8 F (36.6 C) (10/14 0325) Temp Source: Oral (10/14 0325) BP: 129/75 (10/14 1430) Pulse Rate: 107 (10/14 1430)  Labs: Recent Labs    12/05/21 1533 12/05/21 2025 12/06/21 0335 12/06/21 0558 12/06/21 1232  HGB 13.4  --   --   --   --   HCT 42.0  --   --   --   --   PLT 283  --   --   --   --   LABPROT 14.2  --   --   --   --   INR 1.1  --   --   --   --   CREATININE 1.14*  --   --   --  1.12*  TROPONINIHS 16   < > 184* 268* 383*   < > = values in this interval not displayed.  Heparin Dosing Weight: 90.4kg  Estimated Creatinine Clearance: 73.1 mL/min (A) (by C-G formula based on SCr of 1.12 mg/dL (H)).   Medications:  PTA: Eliquis '5mg'$  BID Inpatient: Eliquis '5mg'$  BID (last dose 10/14 1230) >> +heparin gtt  Assessment: 61yo F w/ h/o breast cancer (s/p Chemo & excision in 2012), basal cell carcinoma , HTN, & parAFib presenting with c/o chest pain (trop [phone]). Pharmacy consulted for transition from [place] to heparin gtt.  Date Time aPTT/HL Rate/Comment       Baseline Labs: aPTT - sent/pending; INR - sent/pending Hgb - 13.4;  Plts - 283  Goal of Therapy:  Heparin level 0.3-0.7 units/ml aPTT 66-102 seconds Monitor platelets by anticoagulation protocol: Yes   Plan:  Last dose of eliquis '5mg'$  on 10/14 at 1230. Planning for Cath on 10/16. Give 4000 units bolus x1; then start heparin infusion at 1250 units/hr Check aPTT in 6hours and then daily once consecutively therapeutic.  Check 1st Anti-Xa level with AM labs & at least daily for correlation  Titrate by aPTT's until lab correlation is noted, then titrate by anti-xa alone. Continue to monitor H&H and platelets daily while on heparin gtt.  Shanon Brow [place] Santilli 12/06/2021,2:56 PM

## 2021-12-06 NOTE — Assessment & Plan Note (Signed)
Currently irregular but stable.  Cont eliquis , metoprolol, Rythmol.

## 2021-12-06 NOTE — Assessment & Plan Note (Signed)
Lab Results  Component Value Date   CREATININE 1.14 (H) 12/05/2021   CREATININE 0.99 07/15/2021   CREATININE 1.06 (H) 05/13/2021  Mild kidney dysfunction. Patient has acute kidney injury we will hold diuretic therapy. We will hold any nephrotoxic agents contrast and studies.

## 2021-12-06 NOTE — Assessment & Plan Note (Signed)
Patient has reduced weight.   In courage patient for healthier diet.

## 2021-12-06 NOTE — Assessment & Plan Note (Addendum)
Patient present chest discomfort found to be in atrial relation. Patient has a history of atrial flutter patient that is chronic. Suspect due to electrolyte imbalance, OSA treatment. Differentials also include GERD. Patient's troponins have gone from 16-36-53 and next one is 184. We will cont eliquis and statin therapy.  No complaints of chest pain currently. EKG today shows EKG shows atrial fibrillation at 115 QT of 392 ST depression in lateral leads, Q waves in lead V2 are not significant.  Previous EKG shows sinus rhythm at 63.

## 2021-12-06 NOTE — Progress Notes (Addendum)
[facility]  PROGRESS NOTE  Heather Dudley WNU:[id number] DOB: [birth year] DOA: 12/05/2021 PCP: Glean Hess, MD   Brief HPI:   61 year old female presented with chest pain, located in the anterior chest without shortness of breath.  There was no radiation of chest pain.  Patient has history of atrial fibrillation for which she takes Rythmol and metoprolol and is on anticoagulation with Eliquis.  She was found to be in A-fib with RVR.    Subjective   Patient seen and examined, still complains of chest pain on bending forward.  Says that she has been getting pain on bending forward for quite a while now.   Assessment/Plan:    Chest pain -Likely exacerbated due to atrial fibrillation with RVR -Heart rate is better controlled, -Troponin went up to 184, 268 -Continue Eliquis, atorvastatin -Denies chest pain at this time -EKG showed atrial fibrillation with ST depression in lateral leads -We will consult cardiology  Paroxysmal atrial fibrillation with RVR -Heart rate is controlled -Continue metoprolol, Rythmol -Continue anticoagulation with Eliquis  Hypertension -Blood pressure is stable -Continue metoprolol  OSA  -Continue CPAP nightly  Acute kidney injury -Mild elevation of creatinine 1.12 -Repeat BMP today  Bilateral lower extremity edema -Patient has edema to lower extremities -We will order echocardiogram  Medications     aspirin EC  81 mg Oral Daily   atorvastatin  40 mg Oral Daily   levothyroxine  75 mcg Oral Q0600   metoprolol tartrate  25 mg Oral Q6H     Data Reviewed:   CBG:  No results for input(s): "GLUCAP" in the last 168 hours.  SpO2: 97 %    Vitals:   12/06/21 1707 12/06/21 1730 12/06/21 1800 12/06/21 1830  BP:  (!) 133/93 (!) 145/96 135/86  Pulse:  (!) 104 86 (!) 56  Resp:  (!) 26 (!) 26   Temp: 98 F (36.7 C)     TempSrc: Oral     SpO2:  96% 96% 97%  Weight:      Height:          Data Reviewed:  Basic  Metabolic Panel: Recent Labs  Lab 12/05/21 1533 12/06/21 0335 12/06/21 1232  NA 138  --  137  K 3.3*  --  3.5  CL 100  --  102  CO2 28  --  29  GLUCOSE 120*  --  113*  BUN 16  --  19  CREATININE 1.14*  --  1.12*  CALCIUM 9.4  --  9.0  MG  --  1.9  --     CBC: Recent Labs  Lab 12/05/21 1533  WBC 9.3  HGB 13.4  HCT 42.0  MCV 89.9  PLT 283    LFT No results for input(s): "AST", "ALT", "ALKPHOS", "BILITOT", "PROT", "ALBUMIN" in the last 168 hours.   Antibiotics: Anti-infectives (From admission, onward)    None        DVT prophylaxis: Eliquis  Code Status: Full code  Family Communication: No family at bedside   CONSULTS cardiology   Objective    Physical Examination:   General-appears in no acute distress Heart-S1-S2, regular, no murmur auscultated Lungs-clear to auscultation bilaterally, no wheezing or crackles auscultated Abdomen-soft, nontender, no organomegaly Extremities-2+ edema in the lower extremities Neuro-alert, oriented x3, no focal deficit noted  Status is: Inpatient:           Oswald Hillock   [facility] If 7PM-7AM, please contact night-coverage at [email or web address], Office  [phone]  12/06/2021, 7:01 PM  LOS: 0 days

## 2021-12-06 NOTE — Assessment & Plan Note (Signed)
History of blood in stool none recently. Follow H&H and IV PPI therapy.

## 2021-12-06 NOTE — Assessment & Plan Note (Signed)
Discussed with patient the importance of compliance with the CPAP machine and its effect on right-sided heart failure or pulmonary hypertension and pulmonary embolism.  Patient states that she understands and she will be more compliant and would be interested in an oral appliance.

## 2021-12-06 NOTE — Assessment & Plan Note (Signed)
Vitals:   12/05/21 1532 12/05/21 2030 12/05/21 2100 12/05/21 2130  BP: (!) 151/90 (!) 157/98 (!) (P) 147/89 (!) 152/82   12/05/21 2200 12/06/21 0100 12/06/21 0130 12/06/21 0325  BP: (!) 158/108 130/88 (!) 128/92 126/79  Discussed with patient extensively about diet weight loss and salt restriction.  Fluctuation and concern of orthostatic hypotension.  We will obtain orthostatic vitals. We will resume patient's diltiazem and full dose metoprolol once orthostatics are obtained.

## 2021-12-07 ENCOUNTER — Inpatient Hospital Stay (HOSPITAL_COMMUNITY)
Admit: 2021-12-07 | Discharge: 2021-12-07 | Disposition: A | Discharge status: Home / Self Care | Payer: BC Managed Care – PPO | Attending: Cardiology | Admitting: Cardiology

## 2021-12-07 DIAGNOSIS — E669 Obesity, unspecified: Secondary | ICD-10-CM

## 2021-12-07 DIAGNOSIS — K921 Melena: Secondary | ICD-10-CM

## 2021-12-07 DIAGNOSIS — I10 Essential (primary) hypertension: Secondary | ICD-10-CM | POA: Diagnosis not present

## 2021-12-07 DIAGNOSIS — I480 Paroxysmal atrial fibrillation: Principal | ICD-10-CM

## 2021-12-07 DIAGNOSIS — N179 Acute kidney failure, unspecified: Secondary | ICD-10-CM | POA: Diagnosis not present

## 2021-12-07 DIAGNOSIS — G4733 Obstructive sleep apnea (adult) (pediatric): Secondary | ICD-10-CM

## 2021-12-07 DIAGNOSIS — R072 Precordial pain: Secondary | ICD-10-CM | POA: Diagnosis not present

## 2021-12-07 DIAGNOSIS — I214 Non-ST elevation (NSTEMI) myocardial infarction: Secondary | ICD-10-CM

## 2021-12-07 LAB — ECHOCARDIOGRAM COMPLETE
AR max vel: 2.2 cm2
AV Area VTI: 2.19 cm2
AV Area mean vel: 2.2 cm2
AV Mean grad: 4 mmHg
AV Peak grad: 7.8 mmHg
Ao pk vel: 1.4 m/s
Area-P 1/2: 3.85 cm2
Height: 67 in
S' Lateral: 3.2 cm
Weight: 4284.8 oz

## 2021-12-07 LAB — CBC
HCT: 40.4 % (ref 36.0–46.0)
Hemoglobin: 13.1 g/dL (ref 12.0–15.0)
MCH: 28.5 pg (ref 26.0–34.0)
MCHC: 32.4 g/dL (ref 30.0–36.0)
MCV: 87.8 fL (ref 80.0–100.0)
Platelets: 272 10*3/uL (ref 150–400)
RBC: 4.6 MIL/uL (ref 3.87–5.11)
RDW: 13.9 % (ref 11.5–15.5)
WBC: 10.9 10*3/uL — ABNORMAL HIGH (ref 4.0–10.5)
nRBC: 0 % (ref 0.0–0.2)

## 2021-12-07 LAB — APTT
aPTT: 66 seconds — ABNORMAL HIGH (ref 24–36)
aPTT: 65 seconds — ABNORMAL HIGH (ref 24–36)

## 2021-12-07 LAB — HEPARIN LEVEL (UNFRACTIONATED): Heparin Unfractionated: >1.1 IU/mL — ABNORMAL HIGH (ref 0.30–0.70)

## 2021-12-07 MED ORDER — HEPARIN BOLUS VIA INFUSION
2700.0000 [IU] | Freq: Once | INTRAVENOUS | Status: AC
  Administered 2021-12-07: 2700 [IU] via INTRAVENOUS
  Filled 2021-12-07: qty 2700

## 2021-12-07 MED ORDER — HEPARIN BOLUS VIA INFUSION
1350.0000 [IU] | Freq: Once | INTRAVENOUS | Status: AC
  Administered 2021-12-07: 1350 [IU] via INTRAVENOUS
  Filled 2021-12-07: qty 1350

## 2021-12-07 MED ORDER — POTASSIUM CHLORIDE CRYS ER 20 MEQ PO TBCR
40.0000 meq | EXTENDED_RELEASE_TABLET | Freq: Once | ORAL | Status: AC
  Administered 2021-12-07: 40 meq via ORAL
  Filled 2021-12-07: qty 2

## 2021-12-07 NOTE — Consult Note (Signed)
ANTICOAGULATION CONSULT NOTE - Consult  Pharmacy Consult for heparin gtt (PTA DOAC for AFib) Indication: chest pain/ACS  Allergies  Allergen Reactions   Ace Inhibitors Swelling   Sernivo [Betamethasone Dipropionate] Itching   Penicillins Rash    Has patient had a PCN reaction causing immediate rash, facial/tongue/throat swelling, SOB or lightheadedness with hypotension: Yes Has patient had a PCN reaction causing severe rash involving mucus membranes or skin necrosis: No Has patient had a PCN reaction that required hospitalization: No Has patient had a PCN reaction occurring within the last 10 years: No If all of the above answers are "NO", then may proceed with Cephalosporin use.    Patient Measurements: Height: '5\' 7"'$  (170.2 cm) Weight: 121.5 kg (267 lb 12.8 oz) IBW/kg (Calculated) : 61.6 Heparin Dosing Weight: 90.4kg  Vital Signs: Temp: 98.1 F (36.7 C) (10/15 1134) BP: 118/58 (10/15 1134) Pulse Rate: 71 (10/15 1134)  Labs: Recent Labs    12/05/21 1533 12/05/21 2025 12/06/21 0335 12/06/21 0558 12/06/21 1232 12/06/21 1505 12/06/21 2224 12/07/21 0407 12/07/21 1418  HGB 13.4  --   --   --   --   --   --  13.1  --   HCT 42.0  --   --   --   --   --   --  40.4  --   PLT 283  --   --   --   --   --   --  272  --   APTT  --   --   --   --   --  28 42*  --  66*  LABPROT 14.2  --   --   --   --   --   --   --   --   INR 1.1  --   --   --   --   --   --   --   --   HEPARINUNFRC  --   --   --   --   --   --   --  >1.10*  --   CREATININE 1.14*  --   --   --  1.12*  --   --   --   --   TROPONINIHS 16   < > 184* 268* 383*  --   --   --   --    < > = values in this interval not displayed.   Heparin Dosing Weight: 90.4kg  Estimated Creatinine Clearance: 73.1 mL/min (A) (by C-G formula based on SCr of 1.12 mg/dL (H)).   Medications:  PTA: Eliquis '5mg'$  BID Inpatient: Eliquis '5mg'$  BID (last dose 10/14 1230) >> +heparin gtt  Assessment: 61yo F w/ h/o breast cancer (s/p Chemo  & excision in 2012), basal cell carcinoma , HTN, & parAFib presenting with c/o chest pain (trop [phone]). Pharmacy consulted for transition from [place] to heparin gtt.  Date Time aPTT/HL Rate/Comment 10/14 2224 42s  Subthera; 2725>3664 un/hr     10/14 0407 -- / >1.1 (Lab interference still present) 10/15 1530 66s / --  Thera x1; 1550>1600 un/hr  Baseline Labs: aPTT - 28s; INR - 1.1 Hgb - 13.4; Plts - 283  Goal of Therapy:  Heparin level 0.3-0.7 units/ml aPTT 66-102 seconds Monitor platelets by anticoagulation protocol: Yes   Plan:  Borderline therapeutic aPTT 66s (goal 66-102s). Planning for Cath on 10/16. Increase heparin infusion slightly to 1600 units/hr. Check aPTT in 6 hours from rate change and then daily once consecutively  therapeutic.  Check Anti-Xa level with AM labs for correlation. (Last dose of eliquis '5mg'$  on 10/14 at 1230, HL interference present still greater than assay 10/15).  Titrate by aPTT's until lab correlation is noted, then titrate by anti-xa alone. Continue to monitor H&H and platelets daily while on heparin gtt.  Shanon Brow Calen Geister 12/07/2021,3:28 PM

## 2021-12-07 NOTE — Consult Note (Signed)
ANTICOAGULATION CONSULT NOTE - Consult  Pharmacy Consult for heparin gtt (PTA DOAC for AFib) Indication: chest pain/ACS  Allergies  Allergen Reactions   Ace Inhibitors Swelling   Sernivo [Betamethasone Dipropionate] Itching   Penicillins Rash    Has patient had a PCN reaction causing immediate rash, facial/tongue/throat swelling, SOB or lightheadedness with hypotension: Yes Has patient had a PCN reaction causing severe rash involving mucus membranes or skin necrosis: No Has patient had a PCN reaction that required hospitalization: No Has patient had a PCN reaction occurring within the last 10 years: No If all of the above answers are "NO", then may proceed with Cephalosporin use.    Patient Measurements: Height: '5\' 7"'$  (170.2 cm) Weight: 121.5 kg (267 lb 12.8 oz) IBW/kg (Calculated) : 61.6 Heparin Dosing Weight: 90.4kg  Vital Signs: Temp: 98.1 F (36.7 C) (10/15 2300) BP: 112/55 (10/15 2300) Pulse Rate: 73 (10/15 2300)  Labs: Recent Labs    12/05/21 1533 12/05/21 2025 12/06/21 0335 12/06/21 0558 12/06/21 1232 12/06/21 1505 12/06/21 2224 12/07/21 0407 12/07/21 1418 12/07/21 2211  HGB 13.4  --   --   --   --   --   --  13.1  --   --   HCT 42.0  --   --   --   --   --   --  40.4  --   --   PLT 283  --   --   --   --   --   --  272  --   --   APTT  --   --   --   --   --    < > 42*  --  66* 65*  LABPROT 14.2  --   --   --   --   --   --   --   --   --   INR 1.1  --   --   --   --   --   --   --   --   --   HEPARINUNFRC  --   --   --   --   --   --   --  >1.10*  --   --   CREATININE 1.14*  --   --   --  1.12*  --   --   --   --   --   TROPONINIHS 16   < > 184* 268* 383*  --   --   --   --   --    < > = values in this interval not displayed.   Heparin Dosing Weight: 90.4kg  Estimated Creatinine Clearance: 73.1 mL/min (A) (by C-G formula based on SCr of 1.12 mg/dL (H)).   Medications:  PTA: Eliquis '5mg'$  BID Inpatient: Eliquis '5mg'$  BID (last dose 10/14 1230) >>  +heparin gtt  Assessment: 61yo F w/ h/o breast cancer (s/p Chemo & excision in 2012), basal cell carcinoma , HTN, & parAFib presenting with c/o chest pain (trop [phone]). Pharmacy consulted for transition from [place] to heparin gtt.  Date Time aPTT/HL Rate/Comment 10/14 2224 42s  Subthera; 0932>3557 un/hr     10/14 0407 -- / >1.1 (Lab interference still present) 10/15 1530 66s / --  Thera x1; 1550>1600 un/hr 10/15   2211    65                    SUBtherapeutic   Baseline Labs: aPTT - 28s; INR - 1.1  Hgb - 13.4; Plts - 283  Goal of Therapy:  Heparin level 0.3-0.7 units/ml aPTT 66-102 seconds Monitor platelets by anticoagulation protocol: Yes   Plan:  10/15: APTT @ 2211 = 65, SUBtherapeutic - Will order heparin 1350 units IV X 1 bolus and increase drip rate to 1750 units/hr. - Will recheck aPTT and HL 6 hrs after rate change   Check Anti-Xa level with AM labs for correlation. (Last dose of eliquis '5mg'$  on 10/14 at 1230, HL interference present still greater than assay 10/15).  Titrate by aPTT's until lab correlation is noted, then titrate by anti-xa alone. Continue to monitor H&H and platelets daily while on heparin gtt.  Laelani Vasko D 12/07/2021,11:01 PM

## 2021-12-07 NOTE — Progress Notes (Signed)
PROGRESS NOTE    Heather Dudley   HAL:[id number] DOB: 01/30/1961  DOA: 12/05/2021 Date of Service: 12/07/21 PCP: Glean Hess, MD     Brief Narrative / Hospital Course:  Ms. Heather Dudley presented 12/05/21 with chest pain. She reports intermittent episodes of chest tightness over the last week, occurring both at rest and with exertion. Her hs Tn trend has been 36 > 53 > 184 >268 >383. Known hx Afib on Eliquis, she remains in afib with rates in the 70s-100s. Denies Cardiology following, treating NSTEMI on heparin, pt considering cardiac cath, echo pending.  Decided to go forward with cardiac cath, this is planned for tomorrow 10/16  Consultants:  Cardiology  Procedures: [Planned cardiac cath 10/16]      ASSESSMENT & PLAN:   Principal Problem:   Chest pain Active Problems:   Paroxysmal atrial fibrillation with RVR ([place])   Essential hypertension   OSA on CPAP   Obesity, Class II, BMI 35-39.9   Blood in stool   AKI (acute kidney injury) ([place])   NSTEMI (non-ST elevated myocardial infarction) ([place])   Chest pain NSTEMI Complicated by atrial fibrillation with RVR, now rate controlled Heparin gtt per cardiology  Rate control w/ metoprolol  Cardiac cath planned for tomorrow Echo pending  Cardiology following, appreciate recs   Hypertension Blood pressure is stable Continue metoprolol   Hypokalemia Repleted Follow AM BMP  OSA  Continue CPAP nightly   Acute kidney injury Mild elevation of creatinine 1.12 Repeat BMP    Bilateral lower extremity edema Echocardiogram pending    DVT prophylaxis: on heparin gtt for NSTEMI Pertinent IV fluids/nutrition: no continuous IV fluids  Central lines / invasive devices: none  Code Status: FULL CODE Family Communication: support person at bedside on AM rounds   Disposition: inpatient  TOC needs: may need HH Barriers to discharge / significant pending items: treating NSTEMI, planning for cardiac cath, penidng  echo. Anticipate 1-2 more days inpatient.              Subjective:  Patient reports chest pain overnight has resolved. Attributes this to the heparin drip being restarted overnight. No CP/SOB at this time        Objective:  Vitals:   12/07/21 0031 12/07/21 0529 12/07/21 0819 12/07/21 1134  BP: 120/62 121/62 119/68 (!) 118/58  Pulse: 65 67 66 71  Resp: '16 18 17 18  '$ Temp: 97.9 F (36.6 C) 97.8 F (36.6 C) 98 F (36.7 C) 98.1 F (36.7 C)  TempSrc:      SpO2: 99% 98% 99% 97%  Weight:      Height:        Intake/Output Summary (Last 24 hours) at 12/07/2021 1455 Last data filed at 12/07/2021 7353 Gross per 24 hour  Intake 212.33 ml  Output 0 ml  Net 212.33 ml   Filed Weights   12/05/21 1530 12/06/21 2227  Weight: 121.6 kg 121.5 kg    Examination:  Constitutional:  VS as above General Appearance: alert, well-developed, well-nourished, NAD Respiratory: Normal respiratory effort No wheeze No rhonchi No rales Cardiovascular: S1/S2 normal No murmur No rub/gallop auscultated Musculoskeletal:  Symmetrical movement in all extremities Neurological: No cranial nerve deficit on limited exam Alert Psychiatric: Normal judgment/insight Normal mood and affect       Scheduled Medications:   aspirin EC  81 mg Oral Daily   atorvastatin  40 mg Oral Daily   levothyroxine  75 mcg Oral Q0600   metoprolol tartrate  25 mg Oral Q6H  Continuous Infusions:  heparin 1,550 Units/hr (12/07/21 0818)    PRN Medications:  acetaminophen **OR** acetaminophen, nitroGLYCERIN, ondansetron **OR** ondansetron (ZOFRAN) IV  Antimicrobials:  Anti-infectives (From admission, onward)    None       Data Reviewed: I have personally reviewed following labs and imaging studies  CBC: Recent Labs  Lab 12/05/21 1533 12/07/21 0407  WBC 9.3 10.9*  HGB 13.4 13.1  HCT 42.0 40.4  MCV 89.9 87.8  PLT 283 409   Basic Metabolic Panel: Recent Labs  Lab 12/05/21 1533  12/06/21 0335 12/06/21 1232  NA 138  --  137  K 3.3*  --  3.5  CL 100  --  102  CO2 28  --  29  GLUCOSE 120*  --  113*  BUN 16  --  19  CREATININE 1.14*  --  1.12*  CALCIUM 9.4  --  9.0  MG  --  1.9  --    GFR: Estimated Creatinine Clearance: 73.1 mL/min (A) (by C-G formula based on SCr of 1.12 mg/dL (H)). Liver Function Tests: No results for input(s): "AST", "ALT", "ALKPHOS", "BILITOT", "PROT", "ALBUMIN" in the last 168 hours. No results for input(s): "LIPASE", "AMYLASE" in the last 168 hours. No results for input(s): "AMMONIA" in the last 168 hours. Coagulation Profile: Recent Labs  Lab 12/05/21 1533  INR 1.1   Cardiac Enzymes: No results for input(s): "CKTOTAL", "CKMB", "CKMBINDEX", "TROPONINI" in the last 168 hours. BNP (last 3 results) No results for input(s): "PROBNP" in the last 8760 hours. HbA1C: No results for input(s): "HGBA1C" in the last 72 hours. CBG: No results for input(s): "GLUCAP" in the last 168 hours. Lipid Profile: No results for input(s): "CHOL", "HDL", "LDLCALC", "TRIG", "CHOLHDL", "LDLDIRECT" in the last 72 hours. Thyroid Function Tests: Recent Labs    12/06/21 0335  TSH 5.447*  FREET4 0.65   Anemia Panel: No results for input(s): "VITAMINB12", "FOLATE", "FERRITIN", "TIBC", "IRON", "RETICCTPCT" in the last 72 hours. Urine analysis:    Component Value Date/Time   COLORURINE YELLOW (A) 04/16/2015 1237   APPEARANCEUR CLEAR (A) 04/16/2015 1237   APPEARANCEUR Hazy 04/05/2011 1311   LABSPEC 1.008 04/16/2015 1237   LABSPEC 1.005 04/05/2011 1311   PHURINE 6.0 04/16/2015 1237   GLUCOSEU NEGATIVE 04/16/2015 1237   GLUCOSEU Negative 04/05/2011 1311   HGBUR 3+ (A) 04/16/2015 1237   BILIRUBINUR neg 07/15/2021 1029   BILIRUBINUR Negative 04/05/2011 1311   KETONESUR NEGATIVE 04/16/2015 1237   PROTEINUR Positive (A) 07/15/2021 1029   PROTEINUR 30 (A) 04/16/2015 1237   UROBILINOGEN 0.2 07/15/2021 1029   NITRITE neg 07/15/2021 1029   NITRITE  NEGATIVE 04/16/2015 1237   LEUKOCYTESUR Negative 07/15/2021 1029   LEUKOCYTESUR Negative 04/05/2011 1311   Sepsis Labs: '@LABRCNTIP'$ (procalcitonin:4,lacticidven:4)  No results found for this or any previous visit (from the past 240 hour(s)).       Radiology Studies: No results found.          LOS: 1 day       Emeterio Reeve, DO [facility] 12/07/2021, 2:55 PM   Staff may message me via secure chat in [place]  but this may not receive immediate response,  please page for urgent matters!  If 7PM-7AM, please contact night-coverage [email or web address]  Dictation software was used to generate the above note. Typos may occur and escape review, as with typed/written notes. Please contact Dr Sheppard Coil directly for clarity if needed.

## 2021-12-07 NOTE — Consult Note (Signed)
ANTICOAGULATION CONSULT NOTE - Consult  Pharmacy Consult for heparin gtt (PTA DOAC for AFib) Indication: chest pain/ACS  Allergies  Allergen Reactions   Ace Inhibitors Swelling   Sernivo [Betamethasone Dipropionate] Itching   Penicillins Rash    Has patient had a PCN reaction causing immediate rash, facial/tongue/throat swelling, SOB or lightheadedness with hypotension: Yes Has patient had a PCN reaction causing severe rash involving mucus membranes or skin necrosis: No Has patient had a PCN reaction that required hospitalization: No Has patient had a PCN reaction occurring within the last 10 years: No If all of the above answers are "NO", then may proceed with Cephalosporin use.    Patient Measurements: Height: '5\' 7"'$  (170.2 cm) Weight: 121.5 kg (267 lb 12.8 oz) IBW/kg (Calculated) : 61.6 Heparin Dosing Weight: 90.4kg  Vital Signs: Temp: 97.8 F (36.6 C) (10/15 0529) BP: 121/62 (10/15 0529) Pulse Rate: 67 (10/15 0529)  Labs: Recent Labs    12/05/21 1533 12/05/21 2025 12/06/21 0335 12/06/21 0558 12/06/21 1232 12/06/21 1505 12/06/21 2224 12/07/21 0407  HGB 13.4  --   --   --   --   --   --  13.1  HCT 42.0  --   --   --   --   --   --  40.4  PLT 283  --   --   --   --   --   --  272  APTT  --   --   --   --   --  28 42*  --   LABPROT 14.2  --   --   --   --   --   --   --   INR 1.1  --   --   --   --   --   --   --   HEPARINUNFRC  --   --   --   --   --   --   --  >1.10*  CREATININE 1.14*  --   --   --  1.12*  --   --   --   TROPONINIHS 16   < > 184* 268* 383*  --   --   --    < > = values in this interval not displayed.   Heparin Dosing Weight: 90.4kg  Estimated Creatinine Clearance: 73.1 mL/min (A) (by C-G formula based on SCr of 1.12 mg/dL (H)).   Medications:  PTA: Eliquis '5mg'$  BID Inpatient: Eliquis '5mg'$  BID (last dose 10/14 1230) >> +heparin gtt  Assessment: 61yo F w/ h/o breast cancer (s/p Chemo & excision in 2012), basal cell carcinoma , HTN, & parAFib  presenting with c/o chest pain (trop [phone]). Pharmacy consulted for transition from [place] to heparin gtt.  Date Time aPTT/HL Rate/Comment 10/14 2224 42s  Subthera; 8638>1771 un/hr     10/14 0407 -- / >1.1 (Lab interference still present)  Baseline Labs: aPTT - 28s; INR - 1.1 Hgb - 13.4; Plts - 283  Goal of Therapy:  Heparin level 0.3-0.7 units/ml aPTT 66-102 seconds Monitor platelets by anticoagulation protocol: Yes   Plan:  Last dose of eliquis '5mg'$  on 10/14 at 1230, HL interference present still greater than assay 24hrs after. Planning for Cath on 10/16. Give 2700 units bolus x1; then increase heparin infusion to 1550 units/hr Check aPTT in 6hours and then daily once consecutively therapeutic.  Check daily Anti-Xa level with AM labs for correlation  Titrate by aPTT's until lab correlation is noted, then titrate by anti-xa  alone. Continue to monitor H&H and platelets daily while on heparin gtt.  Shanon Brow Sharde Gover 12/07/2021,7:26 AM

## 2021-12-07 NOTE — Progress Notes (Signed)
Rounding Note    Patient Name: Heather Dudley Date of Encounter: 12/07/2021  [facility] [facility] Cardiologist: Ida Rogue, MD   Subjective   Had one episode of mild chest pain overnight, no events this AM. Spent extensive time at bedside discussing cath, management with patient and her husband. All questions answered.  Inpatient Medications    Scheduled Meds:  aspirin EC  81 mg Oral Daily   atorvastatin  40 mg Oral Daily   levothyroxine  75 mcg Oral Q0600   metoprolol tartrate  25 mg Oral Q6H   Continuous Infusions:  heparin 1,550 Units/hr (12/07/21 0818)   PRN Meds: acetaminophen **OR** acetaminophen, nitroGLYCERIN, ondansetron **OR** ondansetron (ZOFRAN) IV   Vital Signs    Vitals:   12/07/21 0031 12/07/21 0529 12/07/21 0819 12/07/21 1134  BP: 120/62 121/62 119/68 (!) 118/58  Pulse: 65 67 66 71  Resp: '16 18 17 18  '$ Temp: 97.9 F (36.6 C) 97.8 F (36.6 C) 98 F (36.7 C) 98.1 F (36.7 C)  TempSrc:      SpO2: 99% 98% 99% 97%  Weight:      Height:        Intake/Output Summary (Last 24 hours) at 12/07/2021 1244 Last data filed at 12/07/2021 0956 Gross per 24 hour  Intake 212.33 ml  Output 0 ml  Net 212.33 ml      12/06/2021   10:27 PM 12/05/2021    3:30 PM 09/23/2021    2:46 PM  Last 3 Weights  Weight (lbs) 267 lb 12.8 oz 268 lb 272 lb 12.8 oz  Weight (kg) 121.473 kg 121.564 kg 123.741 kg      Telemetry    Atrial fibrillation, rate controlled - Personally Reviewed  ECG    No new - Personally Reviewed  Physical Exam   GEN: No acute distress.   Neck: No JVD Cardiac: irregularly irregular S1/S2, no murmurs, rubs, or gallops.  Respiratory: Clear to auscultation bilaterally. GI: Soft, nontender, non-distended  MS: No edema; No deformity. Neuro:  Nonfocal  Psych: Normal affect   Labs    High Sensitivity Troponin:   Recent Labs  Lab 12/05/21 2025 12/05/21 2142 12/06/21 0335 12/06/21 0558 12/06/21 1232  TROPONINIHS 36* 53*  184* 268* 383*     Chemistry Recent Labs  Lab 12/05/21 1533 12/06/21 0335 12/06/21 1232  NA 138  --  137  K 3.3*  --  3.5  CL 100  --  102  CO2 28  --  29  GLUCOSE 120*  --  113*  BUN 16  --  19  CREATININE 1.14*  --  1.12*  CALCIUM 9.4  --  9.0  MG  --  1.9  --   GFRNONAA 55*  --  57*  ANIONGAP 10  --  6    Lipids No results for input(s): "CHOL", "TRIG", "HDL", "LABVLDL", "LDLCALC", "CHOLHDL" in the last 168 hours.  Hematology Recent Labs  Lab 12/05/21 1533 12/07/21 0407  WBC 9.3 10.9*  RBC 4.67 4.60  HGB 13.4 13.1  HCT 42.0 40.4  MCV 89.9 87.8  MCH 28.7 28.5  MCHC 31.9 32.4  RDW 13.6 13.9  PLT 283 272   Thyroid  Recent Labs  Lab 12/06/21 0335  TSH 5.447*  FREET4 0.65    BNPNo results for input(s): "BNP", "PROBNP" in the last 168 hours.  DDimer No results for input(s): "DDIMER" in the last 168 hours.   Radiology    DG Chest 2 View  Result Date: 12/05/2021  CLINICAL DATA:  Chest pain EXAM: CHEST - 2 VIEW COMPARISON:  07/12/2020 FINDINGS: Transverse diameter of heart is in the upper limits of normal. Thoracic aorta is ectatic. There are no signs of pulmonary edema or focal pulmonary consolidation. There is no pleural effusion or pneumothorax. IMPRESSION: No active cardiopulmonary disease. Electronically Signed   By: Elmer Picker M.D.   On: 12/05/2021 16:08    Cardiac Studies   Echo 06/13/19 1. Left ventricular ejection fraction, by estimation, is 55 to 60%. The  left ventricle has normal function. The left ventricle has no regional  wall motion abnormalities. There is mild left ventricular hypertrophy.  Left ventricular diastolic parameters  are consistent with Grade II diastolic dysfunction (pseudonormalization).  Elevated left atrial pressure.   2. Right ventricular systolic function is normal. The right ventricular  size is normal. There is moderately elevated pulmonary artery systolic  pressure.   3. The mitral valve is normal in structure.  Trivial mitral valve  regurgitation. No evidence of mitral stenosis.   4. The aortic valve was not well visualized. Aortic valve regurgitation  is not visualized. No aortic stenosis is present.   5. Pulmonic valve regurgitation not well assessed.   6. The inferior vena cava is dilated in size with <50% respiratory  variability, suggesting right atrial pressure of 15 mmHg.     Patient Profile     61 y.o. female with a hx of atrial fibrillation who is being seen 12/06/2021 for the evaluation of atrial fibrillation, chest pain, elevated troponin at the request of Dr. Darrick Meigs.   Assessment & Plan    NSTEMI Hyperlipidemia -progressive chest pain over the week prior to admission -hs Tn consistent with ACS -risk factors of hypertension, obesity, hyperlipidemia -stopped apixaban 10/14, started heparin -stopped propafenone, will use metoprolol for rate control -started aspirin -continue atorvastatin -renal function stable. K mildly low, will replete. Could consider spironolactone post cath if BP allows -repeat echo pending today  We discussed cholesterol, plaque, cath, management at length today. She is amenable to proceed with cath. Risks and benefits of cardiac catheterization have been discussed with the patient.  These include bleeding, infection, kidney damage, stroke, heart attack, death.  The patient understands these risks and is willing to proceed.   NPO at midnight   Paroxysmal atrial fibrillation -rate controlled currently -has not had significant RVR to explain her symptoms or troponin elevation -transitioned apixaban to heparin as above   Hypertension -has been elevated recently, though well controlled over last 24 hours -using metoprolol as above -has not been controlled on HCTZ at home  Time Spent with Patient: I have spent a total of 60 minutes in patient care, including reviewing hospital notes, telemetry, EKGs, labs; discussing with the team; examining the patient; and  establishing an assessment and plan that was discussed with the patient.  > 50% of time was spent in direct patient care.    For questions or updates, please contact [place] Please consult [email or web address] for contact info under        Signed, Buford Dresser, MD  12/07/2021, 12:44 PM

## 2021-12-07 NOTE — Plan of Care (Signed)

## 2021-12-08 ENCOUNTER — Encounter: Payer: Self-pay | Admitting: Cardiovascular Disease

## 2021-12-08 ENCOUNTER — Encounter
Admission: EM | Disposition: A | Discharge status: Home / Self Care | Payer: Self-pay | Source: Home / Self Care | Attending: Osteopathic Medicine

## 2021-12-08 DIAGNOSIS — I480 Paroxysmal atrial fibrillation: Secondary | ICD-10-CM | POA: Diagnosis not present

## 2021-12-08 DIAGNOSIS — N179 Acute kidney failure, unspecified: Secondary | ICD-10-CM | POA: Diagnosis not present

## 2021-12-08 DIAGNOSIS — I214 Non-ST elevation (NSTEMI) myocardial infarction: Secondary | ICD-10-CM | POA: Diagnosis not present

## 2021-12-08 DIAGNOSIS — I5031 Acute diastolic (congestive) heart failure: Secondary | ICD-10-CM

## 2021-12-08 DIAGNOSIS — K921 Melena: Secondary | ICD-10-CM | POA: Diagnosis not present

## 2021-12-08 DIAGNOSIS — R072 Precordial pain: Secondary | ICD-10-CM | POA: Diagnosis not present

## 2021-12-08 DIAGNOSIS — I2489 Other forms of acute ischemic heart disease: Secondary | ICD-10-CM | POA: Diagnosis not present

## 2021-12-08 DIAGNOSIS — I10 Essential (primary) hypertension: Secondary | ICD-10-CM | POA: Diagnosis not present

## 2021-12-08 DIAGNOSIS — G4733 Obstructive sleep apnea (adult) (pediatric): Secondary | ICD-10-CM | POA: Diagnosis not present

## 2021-12-08 HISTORY — PX: LEFT HEART CATH AND CORONARY ANGIOGRAPHY: CATH118249

## 2021-12-08 LAB — BASIC METABOLIC PANEL
Anion gap: 9 (ref 5–15)
BUN: 16 mg/dL (ref 6–20)
CO2: 22 mmol/L (ref 22–32)
Calcium: 8.6 mg/dL — ABNORMAL LOW (ref 8.9–10.3)
Chloride: 107 mmol/L (ref 98–111)
Creatinine, Ser: 1.01 mg/dL — ABNORMAL HIGH (ref 0.44–1.00)
GFR, Estimated: >60 mL/min (ref >60)
Glucose, Bld: 103 mg/dL — ABNORMAL HIGH (ref 70–99)
Potassium: 3.7 mmol/L (ref 3.5–5.1)
Sodium: 138 mmol/L (ref 135–145)

## 2021-12-08 LAB — CBC
HCT: 35.9 % — ABNORMAL LOW (ref 36.0–46.0)
Hemoglobin: 11.5 g/dL — ABNORMAL LOW (ref 12.0–15.0)
MCH: 29 pg (ref 26.0–34.0)
MCHC: 32 g/dL (ref 30.0–36.0)
MCV: 90.7 fL (ref 80.0–100.0)
Platelets: 227 10*3/uL (ref 150–400)
RBC: 3.96 MIL/uL (ref 3.87–5.11)
RDW: 13.8 % (ref 11.5–15.5)
WBC: 10.2 10*3/uL (ref 4.0–10.5)
nRBC: 0 % (ref 0.0–0.2)

## 2021-12-08 LAB — APTT
aPTT: 85 seconds — ABNORMAL HIGH (ref 24–36)
aPTT: 84 seconds — ABNORMAL HIGH (ref 24–36)

## 2021-12-08 LAB — HEPARIN LEVEL (UNFRACTIONATED): Heparin Unfractionated: 0.81 IU/mL — ABNORMAL HIGH (ref 0.30–0.70)

## 2021-12-08 SURGERY — LEFT HEART CATH AND CORONARY ANGIOGRAPHY
Anesthesia: Moderate Sedation

## 2021-12-08 MED ORDER — SODIUM CHLORIDE 0.9 % IV SOLN: 250.0000 mL | INTRAVENOUS | Status: DC | PRN

## 2021-12-08 MED ORDER — SODIUM CHLORIDE 0.9 % IV SOLN: INTRAVENOUS | Status: DC

## 2021-12-08 MED ORDER — IOHEXOL 300 MG/ML  SOLN
INTRAMUSCULAR | Status: DC | PRN
  Administered 2021-12-08: 50 mL

## 2021-12-08 MED ORDER — VERAPAMIL HCL 2.5 MG/ML IV SOLN
INTRAVENOUS | Status: AC
  Filled 2021-12-08: qty 2

## 2021-12-08 MED ORDER — EZETIMIBE 10 MG PO TABS
10.0000 mg | ORAL_TABLET | Freq: Every day | ORAL | Status: DC
  Administered 2021-12-09: 10 mg via ORAL
  Filled 2021-12-08 (×2): qty 1

## 2021-12-08 MED ORDER — ASPIRIN 81 MG PO CHEW
CHEWABLE_TABLET | ORAL | Status: AC
  Administered 2021-12-08: 81 mg
  Filled 2021-12-08: qty 1

## 2021-12-08 MED ORDER — SODIUM CHLORIDE 0.9% FLUSH
3.0000 mL | Freq: Two times a day (BID) | INTRAVENOUS | Status: DC
  Administered 2021-12-08: 3 mL via INTRAVENOUS

## 2021-12-08 MED ORDER — MIDAZOLAM HCL 2 MG/2ML IJ SOLN
INTRAMUSCULAR | Status: AC
  Filled 2021-12-08: qty 2

## 2021-12-08 MED ORDER — LIDOCAINE HCL 1 % IJ SOLN
INTRAMUSCULAR | Status: AC
  Filled 2021-12-08: qty 20

## 2021-12-08 MED ORDER — SODIUM CHLORIDE 0.9% FLUSH: 3.0000 mL | INTRAVENOUS | Status: DC | PRN

## 2021-12-08 MED ORDER — VERAPAMIL HCL 2.5 MG/ML IV SOLN
INTRAVENOUS | Status: DC | PRN
  Administered 2021-12-08: 2.5 mg via INTRA_ARTERIAL

## 2021-12-08 MED ORDER — ASPIRIN 81 MG PO CHEW: 81.0000 mg | CHEWABLE_TABLET | ORAL | Status: AC

## 2021-12-08 MED ORDER — METOPROLOL SUCCINATE ER 50 MG PO TB24
ORAL_TABLET | ORAL | Status: AC
  Filled 2021-12-08: qty 2

## 2021-12-08 MED ORDER — FENTANYL CITRATE (PF) 100 MCG/2ML IJ SOLN
INTRAMUSCULAR | Status: DC | PRN
  Administered 2021-12-08: 25 ug via INTRAVENOUS

## 2021-12-08 MED ORDER — FENTANYL CITRATE (PF) 100 MCG/2ML IJ SOLN
INTRAMUSCULAR | Status: AC
  Filled 2021-12-08: qty 2

## 2021-12-08 MED ORDER — LIDOCAINE HCL (PF) 1 % IJ SOLN
INTRAMUSCULAR | Status: DC | PRN
  Administered 2021-12-08: 2 mL

## 2021-12-08 MED ORDER — HEPARIN (PORCINE) IN NACL 1000-0.9 UT/500ML-% IV SOLN
INTRAVENOUS | Status: DC | PRN
  Administered 2021-12-08 (×2): 500 mL

## 2021-12-08 MED ORDER — HEPARIN SODIUM (PORCINE) 1000 UNIT/ML IJ SOLN
INTRAMUSCULAR | Status: AC
  Filled 2021-12-08: qty 10

## 2021-12-08 MED ORDER — METOPROLOL SUCCINATE ER 100 MG PO TB24
100.0000 mg | ORAL_TABLET | Freq: Every day | ORAL | Status: DC
  Administered 2021-12-08 – 2021-12-09 (×2): 100 mg via ORAL
  Filled 2021-12-08: qty 1

## 2021-12-08 MED ORDER — MIDAZOLAM HCL 2 MG/2ML IJ SOLN
INTRAMUSCULAR | Status: DC | PRN
  Administered 2021-12-08: 1 mg via INTRAVENOUS

## 2021-12-08 MED ORDER — HEPARIN (PORCINE) IN NACL 1000-0.9 UT/500ML-% IV SOLN
INTRAVENOUS | Status: AC
  Filled 2021-12-08: qty 1000

## 2021-12-08 MED ORDER — SODIUM CHLORIDE 0.9% FLUSH: 3.0000 mL | Freq: Two times a day (BID) | INTRAVENOUS | Status: DC

## 2021-12-08 MED ORDER — APIXABAN 5 MG PO TABS
5.0000 mg | ORAL_TABLET | Freq: Two times a day (BID) | ORAL | Status: DC
  Administered 2021-12-09: 5 mg via ORAL
  Filled 2021-12-08: qty 1

## 2021-12-08 MED ORDER — HEPARIN SODIUM (PORCINE) 1000 UNIT/ML IJ SOLN
INTRAMUSCULAR | Status: DC | PRN
  Administered 2021-12-08: 6000 [IU] via INTRAVENOUS

## 2021-12-08 SURGICAL SUPPLY — 10 items
CATH INFINITI 5FR JK (CATHETERS) IMPLANT
CATH INFINITI JR4 5F (CATHETERS) IMPLANT
DRAPE BRACHIAL (DRAPES) IMPLANT
GLIDESHEATH SLEND SS 6F .021 (SHEATH) IMPLANT
GUIDEWIRE INQWIRE 1.5J.035X260 (WIRE) IMPLANT
INQWIRE 1.5J .035X260CM (WIRE) ×1
PACK CARDIAC CATH (CUSTOM PROCEDURE TRAY) ×1 IMPLANT
PROTECTION STATION PRESSURIZED (MISCELLANEOUS) ×1
SET ATX SIMPLICITY (MISCELLANEOUS) IMPLANT
STATION PROTECTION PRESSURIZED (MISCELLANEOUS) IMPLANT

## 2021-12-08 NOTE — Progress Notes (Signed)
PROGRESS NOTE    Heather Dudley   OEV:[id number] DOB: 1960/06/12  DOA: 12/05/2021 Date of Service: 12/08/21 PCP: Glean Hess, MD     Brief Narrative / Hospital Course:  Heather Dudley presented 12/05/21 with chest pain. She reports intermittent episodes of chest tightness over the last week, occurring both at rest and with exertion. Her hs Tn trend has been 36 > 53 > 184 >268 >383. Known hx Afib on Eliquis, she remains in afib with rates in the 70s-100s. Denies Cardiology following, treating NSTEMI on heparin, pt considering cardiac cath, echo pending.  Decided to go forward with cardiac cath, this is planned for 10/16. Echo resulted LVEF 55-60, no RWMA, normal diastolic parameters  Cardiac Cath 10/16 showed near normal coronary arteries with no obstructive disease.  Left ventricular end-diastolic pressure was moderately elevated at 26 mmHg.. Cardiology recommending discharge tomorrow, symptoms likely d/t Afib   Consultants:  Cardiology  Procedures: Echocardiogram 12/07/21: LVEF 55-60, no RWMA, normal diastolic parameters Cardiac Cath 12/08/21 showed near normal coronary arteries with no obstructive disease.  Left ventricular end-diastolic pressure was moderately elevated at 26 mmHg.      ASSESSMENT & PLAN:   Principal Problem:   Chest pain Active Problems:   Paroxysmal atrial fibrillation with RVR (HCC)   Essential hypertension   OSA on CPAP   Obesity, Class II, BMI 35-39.9   Blood in stool   AKI (acute kidney injury) (HCC)   NSTEMI (non-ST elevated myocardial infarction) (HCC)   Chest pain Elevated troponin d/t supply/demand mismatch - MI ruled out  Complicated by atrial fibrillation with RVR, now rate controlled Suspect that chest pain was due to A-fib with RVR and acute on chronic diastolic heart failure. Heparin gtt per cardiology  Rate control w/ metoprolol  Cardiac cath today, see above  Echo done, no concerns, see above   Cardiology following,  appreciate recs  Resume Eliquis tomorrow   Hypertension Blood pressure is stable Continue metoprolol for HTN and Afib rate control  Switch HCTZ to Lasix on discharge Can add amlodipine as well if needed   Acute diastolic heart failure likely in the setting of atrial fibrillation:  LVEDP was 26 at the time of cardiac catheterization.   Consider switching hydrochlorothiazide to furosemide before hospital discharge.  Hypokalemia - resolved  Repleted Follow AM BMP  OSA - stable  Continue CPAP nightly   Acute kidney injury - resolved Mild elevation of creatinine 1.12 --> improved to 1.01 Montior BMP    Bilateral lower extremity edema Echocardiogram as above no CHF     DVT prophylaxis: on heparin gtt  --> d/c Pertinent IV fluids/nutrition: no continuous IV fluids  Central lines / invasive devices: none  Code Status: FULL CODE Family Communication: support person at bedside on AM rounds   Disposition: inpatient  TOC needs: none Barriers to discharge / significant pending items: cardiology states anticipate discharge home tomorrow             Subjective:  Patient reports chest pain has resolved, no SOB or palpiatations        Objective:  Vitals:   12/08/21 1645 12/08/21 1700 12/08/21 1715 12/08/21 1730  BP: (!) 123/96 136/83 (!) 157/79 129/70  Pulse: 77 63 85 70  Resp: '18 20 20 17  '$ Temp:      TempSrc:      SpO2: 97% 99% 99% 98%  Weight:      Height:        Intake/Output Summary (Last 24 hours)  at 12/08/2021 1841 Last data filed at 12/08/2021 1821 Gross per 24 hour  Intake 1086.07 ml  Output 500 ml  Net 586.07 ml   Filed Weights   12/05/21 1530 12/06/21 2227 12/08/21 1444  Weight: 121.6 kg 121.5 kg 121.1 kg    Examination:  Constitutional:  VS as above General Appearance: alert, well-developed, well-nourished, NAD Respiratory: Normal respiratory effort No wheeze No rhonchi No rales Cardiovascular: S1/S2 normal RRR No murmur No  rub/gallop auscultated Musculoskeletal:  Symmetrical movement in all extremities Neurological: No cranial nerve deficit on limited exam Alert Psychiatric: Normal judgment/insight Normal mood and affect       Scheduled Medications:   [START ON 12/09/2021] apixaban  5 mg Oral BID   aspirin EC  81 mg Oral Daily   atorvastatin  40 mg Oral Daily   [START ON 12/09/2021] ezetimibe  10 mg Oral Daily   levothyroxine  75 mcg Oral Q0600   metoprolol succinate  100 mg Oral Daily   sodium chloride flush  3 mL Intravenous Q12H    Continuous Infusions:  sodium chloride      PRN Medications:  sodium chloride, acetaminophen **OR** acetaminophen, nitroGLYCERIN, ondansetron **OR** ondansetron (ZOFRAN) IV, sodium chloride flush  Antimicrobials:  Anti-infectives (From admission, onward)    None       Data Reviewed: I have personally reviewed following labs and imaging studies  CBC: Recent Labs  Lab 12/05/21 1533 12/07/21 0407 12/08/21 0412  WBC 9.3 10.9* 10.2  HGB 13.4 13.1 11.5*  HCT 42.0 40.4 35.9*  MCV 89.9 87.8 90.7  PLT 283 272 169   Basic Metabolic Panel: Recent Labs  Lab 12/05/21 1533 12/06/21 0335 12/06/21 1232 12/08/21 0412  NA 138  --  137 138  K 3.3*  --  3.5 3.7  CL 100  --  102 107  CO2 28  --  29 22  GLUCOSE 120*  --  113* 103*  BUN 16  --  19 16  CREATININE 1.14*  --  1.12* 1.01*  CALCIUM 9.4  --  9.0 8.6*  MG  --  1.9  --   --    GFR: Estimated Creatinine Clearance: 80.9 mL/min (A) (by C-G formula based on SCr of 1.01 mg/dL (H)). Liver Function Tests: No results for input(s): "AST", "ALT", "ALKPHOS", "BILITOT", "PROT", "ALBUMIN" in the last 168 hours. No results for input(s): "LIPASE", "AMYLASE" in the last 168 hours. No results for input(s): "AMMONIA" in the last 168 hours. Coagulation Profile: Recent Labs  Lab 12/05/21 1533  INR 1.1   Cardiac Enzymes: No results for input(s): "CKTOTAL", "CKMB", "CKMBINDEX", "TROPONINI" in the last 168  hours. BNP (last 3 results) No results for input(s): "PROBNP" in the last 8760 hours. HbA1C: No results for input(s): "HGBA1C" in the last 72 hours. CBG: No results for input(s): "GLUCAP" in the last 168 hours. Lipid Profile: No results for input(s): "CHOL", "HDL", "LDLCALC", "TRIG", "CHOLHDL", "LDLDIRECT" in the last 72 hours. Thyroid Function Tests: Recent Labs    12/06/21 0335  TSH 5.447*  FREET4 0.65   Anemia Panel: No results for input(s): "VITAMINB12", "FOLATE", "FERRITIN", "TIBC", "IRON", "RETICCTPCT" in the last 72 hours. Urine analysis:    Component Value Date/Time   COLORURINE YELLOW (A) 04/16/2015 1237   APPEARANCEUR CLEAR (A) 04/16/2015 1237   APPEARANCEUR Hazy 04/05/2011 1311   LABSPEC 1.008 04/16/2015 1237   LABSPEC 1.005 04/05/2011 1311   PHURINE 6.0 04/16/2015 1237   GLUCOSEU NEGATIVE 04/16/2015 1237   GLUCOSEU Negative 04/05/2011 1311  HGBUR 3+ (A) 04/16/2015 1237   BILIRUBINUR neg 07/15/2021 1029   BILIRUBINUR Negative 04/05/2011 1311   KETONESUR NEGATIVE 04/16/2015 1237   PROTEINUR Positive (A) 07/15/2021 1029   PROTEINUR 30 (A) 04/16/2015 1237   UROBILINOGEN 0.2 07/15/2021 1029   NITRITE neg 07/15/2021 1029   NITRITE NEGATIVE 04/16/2015 1237   LEUKOCYTESUR Negative 07/15/2021 1029   LEUKOCYTESUR Negative 04/05/2011 1311   Sepsis Labs: '@LABRCNTIP'$ (procalcitonin:4,lacticidven:4)  No results found for this or any previous visit (from the past 240 hour(s)).       Radiology Studies: No results found.          LOS: 2 days       Emeterio Reeve, DO [facility] 12/08/2021, 6:41 PM   Staff may message me via secure chat in [place]  but this may not receive immediate response,  please page for urgent matters!  If 7PM-7AM, please contact night-coverage [email or web address]  Dictation software was used to generate the above note. Typos may occur and escape review, as with typed/written notes. Please contact Dr Sheppard Coil directly  for clarity if needed.

## 2021-12-08 NOTE — Progress Notes (Signed)
Rounding Note    Patient Name: Heather Dudley Date of Encounter: 12/08/2021  [place] Cardiologist: Ida Rogue, MD   Subjective   She feels better with no further chest pain.  Still with shortness of breath.  Left heart catheterization done today showed near normal coronary arteries with no obstructive disease.  Left ventricular end-diastolic pressure was moderately elevated at 26 mmHg.  Inpatient Medications    Scheduled Meds:  [START ON 12/09/2021] apixaban  5 mg Oral BID   [MAR Hold] aspirin EC  81 mg Oral Daily   [MAR Hold] atorvastatin  40 mg Oral Daily   [START ON 12/09/2021] ezetimibe  10 mg Oral Daily   [MAR Hold] levothyroxine  75 mcg Oral Q0600   metoprolol succinate  100 mg Oral Daily   metoprolol succinate       sodium chloride flush  3 mL Intravenous Q12H   Continuous Infusions:  sodium chloride     PRN Meds: sodium chloride, [MAR Hold] acetaminophen **OR** [MAR Hold] acetaminophen, metoprolol succinate, [MAR Hold] nitroGLYCERIN, [MAR Hold] ondansetron **OR** [MAR Hold] ondansetron (ZOFRAN) IV, sodium chloride flush   Vital Signs    Vitals:   12/08/21 1545 12/08/21 1600 12/08/21 1615 12/08/21 1619  BP: 136/73 130/80 132/69 132/69  Pulse: 88 89 66 74  Resp: 15 (!) 24 (!) 22   Temp:      TempSrc:      SpO2: 100% 98% 98%   Weight:      Height:        Intake/Output Summary (Last 24 hours) at 12/08/2021 1630 Last data filed at 12/08/2021 0803 Gross per 24 hour  Intake 34 ml  Output --  Net 34 ml       12/08/2021    2:44 PM 12/06/2021   10:27 PM 12/05/2021    3:30 PM  Last 3 Weights  Weight (lbs) 267 lb 267 lb 12.8 oz 268 lb  Weight (kg) 121.11 kg 121.473 kg 121.564 kg      Telemetry    Atrial fibrillation, rate controlled - Personally Reviewed  ECG    No new - Personally Reviewed  Physical Exam   GEN: No acute distress.   Neck: No JVD Cardiac: irregularly irregular S1/S2, no murmurs, rubs, or gallops.   Respiratory: Clear to auscultation bilaterally. GI: Soft, nontender, non-distended  MS: No edema; No deformity. Neuro:  Nonfocal  Psych: Normal affect   Labs    High Sensitivity Troponin:   Recent Labs  Lab 12/05/21 2025 12/05/21 2142 12/06/21 0335 12/06/21 0558 12/06/21 1232  TROPONINIHS 36* 53* 184* 268* 383*      Chemistry Recent Labs  Lab 12/05/21 1533 12/06/21 0335 12/06/21 1232 12/08/21 0412  NA 138  --  137 138  K 3.3*  --  3.5 3.7  CL 100  --  102 107  CO2 28  --  29 22  GLUCOSE 120*  --  113* 103*  BUN 16  --  19 16  CREATININE 1.14*  --  1.12* 1.01*  CALCIUM 9.4  --  9.0 8.6*  MG  --  1.9  --   --   GFRNONAA 55*  --  57* >60  ANIONGAP 10  --  6 9     Lipids No results for input(s): "CHOL", "TRIG", "HDL", "LABVLDL", "LDLCALC", "CHOLHDL" in the last 168 hours.  Hematology Recent Labs  Lab 12/05/21 1533 12/07/21 0407 12/08/21 0412  WBC 9.3 10.9* 10.2  RBC 4.67 4.60 3.96  HGB  13.4 13.1 11.5*  HCT 42.0 40.4 35.9*  MCV 89.9 87.8 90.7  MCH 28.7 28.5 29.0  MCHC 31.9 32.4 32.0  RDW 13.6 13.9 13.8  PLT 283 272 227    Thyroid  Recent Labs  Lab 12/06/21 0335  TSH 5.447*  FREET4 0.65     BNPNo results for input(s): "BNP", "PROBNP" in the last 168 hours.  DDimer No results for input(s): "DDIMER" in the last 168 hours.   Radiology    CARDIAC CATHETERIZATION  Result Date: 12/08/2021   The left ventricular systolic function is normal.   LV end diastolic pressure is moderately elevated.   The left ventricular ejection fraction is 55-65% by visual estimate. 1.  Near normal coronary arteries with no significant coronary artery disease. 2.  Normal LV systolic function and moderately elevated left ventricular end-diastolic pressure at 26 mmHg. Recommendations: No culprit is identified for elevated troponin. Elevated troponin is likely due to supply demand ischemia possibly in the setting of underlying arrhythmia and diastolic heart failure. Recommend  continuing medical therapy Resume Eliquis tomorrow. Possible discharge home tomorrow with outpatient monitor.   ECHOCARDIOGRAM COMPLETE  Result Date: 12/07/2021    ECHOCARDIOGRAM REPORT   Patient Name:   Heather Dudley Date of Exam: 12/07/2021 Medical Rec #:  [id number]            Height:       67.0 in Accession #:    [id number]           Weight:       267.8 lb Date of Birth:  01-24-1961            BSA:          2.289 m Patient Age:    61 years             BP:           121/62 mmHg Patient Gender: F                    HR:           64 bpm. Exam Location:  [facility] Procedure: 2D Echo Indications:     NSTEMI I21.4  History:         Patient has prior history of Echocardiogram examinations, most                  recent 06/13/2019.  Sonographer:     Ronny Flurry Referring Phys:  [id number] [place] Diagnosing Phys: Buford Dresser MD IMPRESSIONS  1. Left ventricular ejection fraction, by estimation, is 55 to 60%. The left ventricle has normal function. The left ventricle has no regional wall motion abnormalities. Left ventricular diastolic parameters were normal.  2. Right ventricular systolic function is normal. The right ventricular size is normal. There is normal pulmonary artery systolic pressure.  3. Left atrial size was mildly dilated.  4. The mitral valve is normal in structure. Trivial mitral valve regurgitation. No evidence of mitral stenosis.  5. The aortic valve is tricuspid. Aortic valve regurgitation is not visualized. No aortic stenosis is present.  6. The inferior vena cava is normal in size with greater than 50% respiratory variability, suggesting right atrial pressure of 3 mmHg. Comparison(s): No significant change from prior study. Conclusion(s)/Recommendation(s): Normal biventricular function without evidence of hemodynamically significant valvular heart disease. FINDINGS  Left Ventricle: Left ventricular ejection fraction, by estimation, is 55 to 60%. The left ventricle has  normal function. The left ventricle has no regional wall  motion abnormalities. The left ventricular internal cavity size was normal in size. There is  no left ventricular hypertrophy. Left ventricular diastolic parameters were normal. Right Ventricle: The right ventricular size is normal. No increase in right ventricular wall thickness. Right ventricular systolic function is normal. There is normal pulmonary artery systolic pressure. The tricuspid regurgitant velocity is 2.23 m/s, and  with an assumed right atrial pressure of 3 mmHg, the estimated right ventricular systolic pressure is 81.0 mmHg. Left Atrium: Left atrial size was mildly dilated. Right Atrium: Right atrial size was normal in size. Prominent Eustachian valve. Pericardium: There is no evidence of pericardial effusion. Mitral Valve: The mitral valve is normal in structure. Trivial mitral valve regurgitation. No evidence of mitral valve stenosis. Tricuspid Valve: The tricuspid valve is normal in structure. Tricuspid valve regurgitation is trivial. No evidence of tricuspid stenosis. Aortic Valve: The aortic valve is tricuspid. Aortic valve regurgitation is not visualized. No aortic stenosis is present. Aortic valve mean gradient measures 4.0 mmHg. Aortic valve peak gradient measures 7.8 mmHg. Aortic valve area, by VTI measures 2.19 cm. Pulmonic Valve: The pulmonic valve was not well visualized. Pulmonic valve regurgitation is trivial. No evidence of pulmonic stenosis. Aorta: The aortic root, ascending aorta, aortic arch and descending aorta are all structurally normal, with no evidence of dilitation or obstruction. Venous: The inferior vena cava is normal in size with greater than 50% respiratory variability, suggesting right atrial pressure of 3 mmHg. IAS/Shunts: The atrial septum is grossly normal.  LEFT VENTRICLE PLAX 2D LVIDd:         4.30 cm   Diastology LVIDs:         3.20 cm   LV e' medial:    8.59 cm/s LV PW:         0.90 cm   LV E/e' medial:   12.0 LV IVS:        1.00 cm   LV e' lateral:   11.70 cm/s LVOT diam:     2.10 cm   LV E/e' lateral: 8.8 LV SV:         68 LV SV Index:   30 LVOT Area:     3.46 cm  RIGHT VENTRICLE RV S prime:     8.59 cm/s TAPSE (M-mode): 1.7 cm LEFT ATRIUM             Index        RIGHT ATRIUM           Index LA diam:        4.00 cm 1.75 cm/m   RA Area:     16.90 cm LA Vol (A2C):   66.9 ml 29.22 ml/m  RA Volume:   46.60 ml  20.35 ml/m LA Vol (A4C):   56.6 ml 24.72 ml/m LA Biplane Vol: 67.2 ml 29.35 ml/m  AORTIC VALVE AV Area (Vmax):    2.20 cm AV Area (Vmean):   2.20 cm AV Area (VTI):     2.19 cm AV Vmax:           140.00 cm/s AV Vmean:          97.500 cm/s AV VTI:            0.311 m AV Peak Grad:      7.8 mmHg AV Mean Grad:      4.0 mmHg LVOT Vmax:         89.10 cm/s LVOT Vmean:        61.800 cm/s LVOT VTI:  0.197 m LVOT/AV VTI ratio: 0.63  AORTA Ao Root diam: 3.10 cm Ao Asc diam:  3.70 cm MITRAL VALVE                TRICUSPID VALVE MV Area (PHT): 3.85 cm     TR Peak grad:   19.9 mmHg MV Decel Time: 197 msec     TR Vmax:        223.00 cm/s MV E velocity: 103.00 cm/s MV A velocity: 52.90 cm/s   SHUNTS MV E/A ratio:  1.95         Systemic VTI:  0.20 m                             Systemic Diam: 2.10 cm Buford Dresser MD Electronically signed by Buford Dresser MD Signature Date/Time: 12/07/2021/4:59:24 PM    Final     Cardiac Studies   Echo 06/13/19 1. Left ventricular ejection fraction, by estimation, is 55 to 60%. The  left ventricle has normal function. The left ventricle has no regional  wall motion abnormalities. There is mild left ventricular hypertrophy.  Left ventricular diastolic parameters  are consistent with Grade II diastolic dysfunction (pseudonormalization).  Elevated left atrial pressure.   2. Right ventricular systolic function is normal. The right ventricular  size is normal. There is moderately elevated pulmonary artery systolic  pressure.   3. The mitral valve is normal  in structure. Trivial mitral valve  regurgitation. No evidence of mitral stenosis.   4. The aortic valve was not well visualized. Aortic valve regurgitation  is not visualized. No aortic stenosis is present.   5. Pulmonic valve regurgitation not well assessed.   6. The inferior vena cava is dilated in size with <50% respiratory  variability, suggesting right atrial pressure of 15 mmHg.     Patient Profile     61 y.o. female with a hx of atrial fibrillation who i presented on 12/06/2021 with atrial fibrillation, chest pain, elevated troponin   Assessment & Plan     1.  Elevated troponin: This is due to supply demand mismatch as cardiac catheterization today showed no evidence of obstructive coronary artery disease.  No evidence of myocardial infarction. Suspect that this is due to A-fib with RVR and acute on chronic diastolic heart failure. No need for aspirin in the setting of anticoagulation with Eliquis.  2.  Paroxysmal atrial fibrillation: The patient was in A-fib with mild tachycardia on presentation.  It seems that she converted to sinus rhythm.  Continue metoprolol.  Resume anticoagulation with Eliquis tomorrow morning. She reports that palpitations and tachycardia preceded her symptoms of chest pain and shortness of breath and thus I think her current presentation was triggered by atrial fibrillation.  3.  Acute diastolic heart failure likely in the setting of atrial fibrillation: LVEDP was 26 at the time of cardiac catheterization.  Consider switching hydrochlorothiazide to furosemide before hospital discharge.  4.  Sleep apnea: She reports compliance with CPAP at home.  5.  Essential hypertension: She was on metoprolol and hydrochlorothiazide as an outpatient.  Hydrochlorothiazide is currently on hold.  Consider switching to a small dose furosemide given her symptoms and can add amlodipine if needed for blood pressure control.  The patient can likely be discharged home  tomorrow. I discussed the findings with the husband who was at the bedside.  For questions or updates, please contact [place] Please consult [email or web address] for contact info under  Signed, Kathlyn Sacramento, MD  12/08/2021, 4:30 PM

## 2021-12-08 NOTE — TOC CM/SW Note (Signed)
  Transition of Care [facility]) Screening Note   Patient Details  Name: Heather Dudley Date of Birth: [birth year]   [facility]) CM/SW Contact:    Candie Chroman, LCSW Phone Number: 12/08/2021, 3:55 PM    Transition of Care Department [facility]) has reviewed patient and no TOC needs have been identified at this time. We will continue to monitor patient advancement through interdisciplinary progression rounds. If new patient transition needs arise, please place a TOC consult.

## 2021-12-08 NOTE — Consult Note (Signed)
ANTICOAGULATION CONSULT NOTE - Consult  Pharmacy Consult for heparin gtt (PTA DOAC for AFib) Indication: chest pain/ACS  Allergies  Allergen Reactions   Ace Inhibitors Swelling   Sernivo [Betamethasone Dipropionate] Itching   Penicillins Rash    Has patient had a PCN reaction causing immediate rash, facial/tongue/throat swelling, SOB or lightheadedness with hypotension: Yes Has patient had a PCN reaction causing severe rash involving mucus membranes or skin necrosis: No Has patient had a PCN reaction that required hospitalization: No Has patient had a PCN reaction occurring within the last 10 years: No If all of the above answers are "NO", then may proceed with Cephalosporin use.    Patient Measurements: Height: '5\' 7"'$  (170.2 cm) Weight: 121.5 kg (267 lb 12.8 oz) IBW/kg (Calculated) : 61.6 Heparin Dosing Weight: 90.4kg  Vital Signs: Temp: 98.4 F (36.9 C) (10/16 1144) Temp Source: Oral (10/16 1144) BP: 141/74 (10/16 1144) Pulse Rate: 71 (10/16 1144)  Labs: Recent Labs    12/05/21 1533 12/05/21 2025 12/06/21 0335 12/06/21 0558 12/06/21 1232 12/06/21 1505 12/07/21 0407 12/07/21 1418 12/07/21 2211 12/08/21 0412 12/08/21 1002  HGB 13.4  --   --   --   --   --  13.1  --   --  11.5*  --   HCT 42.0  --   --   --   --   --  40.4  --   --  35.9*  --   PLT 283  --   --   --   --   --  272  --   --  227  --   APTT  --   --   --   --   --    < >  --    < > 65* 85* 84*  LABPROT 14.2  --   --   --   --   --   --   --   --   --   --   INR 1.1  --   --   --   --   --   --   --   --   --   --   HEPARINUNFRC  --   --   --   --   --   --  >1.10*  --   --  0.81*  --   CREATININE 1.14*  --   --   --  1.12*  --   --   --   --  1.01*  --   TROPONINIHS 16   < > 184* 268* 383*  --   --   --   --   --   --    < > = values in this interval not displayed.   Heparin Dosing Weight: 90.4kg  Estimated Creatinine Clearance: 81 mL/min (A) (by C-G formula based on SCr of 1.01 mg/dL  (H)).   Medications:  PTA: Eliquis '5mg'$  BID Inpatient: Eliquis '5mg'$  BID (last dose 10/14 1230) >> +heparin gtt  Assessment: 61yo F w/ h/o breast cancer (s/p Chemo & excision in 2012), basal cell carcinoma , HTN, & parAFib presenting with c/o chest pain (trop [phone]). Pharmacy consulted for transition from [place] to heparin gtt.  Date Time aPTT/HL Rate/Comment 10/14 2224 42s  Subthera; 4709>6283 un/hr     10/14 0407 -- / >1.1 (Lab interference still present) 10/15 1530 66s / --  Thera x1; 1550>1600 un/hr 10/15   2211    65  SUBtherapeutic  10/16   0412    85/0.81           aPTT thera X 1, HL elevated 10/16   1002    84/--                aPTT thera X 2  Baseline Labs: aPTT - 28s; INR - 1.1 Hgb - 13.4; Plts - 283  Goal of Therapy:  Heparin level 0.3-0.7 units/ml aPTT 66-102 seconds Monitor platelets by anticoagulation protocol: Yes   Plan:  10/16 @ 10:02  aPTT = 84 -  HL elevated still from Eliquis PTA,  aPTT therapeutic X 2 - Will continue pt on current rate and recheck aPTT and HL on 10/17 with AM labs Check Anti-Xa level with AM labs for correlation. (Last dose of eliquis '5mg'$  on 10/14 at 1230, HL interference present still greater than assay 10/15).  Titrate by aPTT's until lab correlation is noted, then titrate by anti-xa alone. Continue to monitor H&H and platelets daily while on heparin gtt.  Alison Murray 12/08/2021,12:05 PM

## 2021-12-08 NOTE — Consult Note (Signed)
ANTICOAGULATION CONSULT NOTE - Consult  Pharmacy Consult for heparin gtt (PTA DOAC for AFib) Indication: chest pain/ACS  Allergies  Allergen Reactions   Ace Inhibitors Swelling   Sernivo [Betamethasone Dipropionate] Itching   Penicillins Rash    Has patient had a PCN reaction causing immediate rash, facial/tongue/throat swelling, SOB or lightheadedness with hypotension: Yes Has patient had a PCN reaction causing severe rash involving mucus membranes or skin necrosis: No Has patient had a PCN reaction that required hospitalization: No Has patient had a PCN reaction occurring within the last 10 years: No If all of the above answers are "NO", then may proceed with Cephalosporin use.    Patient Measurements: Height: '5\' 7"'$  (170.2 cm) Weight: 121.5 kg (267 lb 12.8 oz) IBW/kg (Calculated) : 61.6 Heparin Dosing Weight: 90.4kg  Vital Signs: Temp: 98 F (36.7 C) (10/16 0720) BP: 107/55 (10/16 0720) Pulse Rate: 61 (10/16 0720)  Labs: Recent Labs    12/05/21 1533 12/05/21 2025 12/06/21 0335 12/06/21 0558 12/06/21 1232 12/06/21 1505 12/07/21 0407 12/07/21 1418 12/07/21 2211 12/08/21 0412  HGB 13.4  --   --   --   --   --  13.1  --   --  11.5*  HCT 42.0  --   --   --   --   --  40.4  --   --  35.9*  PLT 283  --   --   --   --   --  272  --   --  227  APTT  --   --   --   --   --    < >  --  66* 65* 85*  LABPROT 14.2  --   --   --   --   --   --   --   --   --   INR 1.1  --   --   --   --   --   --   --   --   --   HEPARINUNFRC  --   --   --   --   --   --  >1.10*  --   --  0.81*  CREATININE 1.14*  --   --   --  1.12*  --   --   --   --  1.01*  TROPONINIHS 16   < > 184* 268* 383*  --   --   --   --   --    < > = values in this interval not displayed.   Heparin Dosing Weight: 90.4kg  Estimated Creatinine Clearance: 81 mL/min (A) (by C-G formula based on SCr of 1.01 mg/dL (H)).   Medications:  PTA: Eliquis '5mg'$  BID Inpatient: Eliquis '5mg'$  BID (last dose 10/14 1230) >>  +heparin gtt  Assessment: 61yo F w/ h/o breast cancer (s/p Chemo & excision in 2012), basal cell carcinoma , HTN, & parAFib presenting with c/o chest pain (trop [phone]). Pharmacy consulted for transition from Nemacolin to heparin gtt.  Date Time aPTT/HL Rate/Comment 10/14 2224 42s  Subthera; 8588>5027 un/hr     10/14 0407 -- / >1.1 (Lab interference still present) 10/15 1530 66s / --  Thera x1; 1550>1600 un/hr 10/15   2211    65                    SUBtherapeutic  10/16   0412    85/0.81           aPTT  thera X 1, HL elevated  Baseline Labs: aPTT - 28s; INR - 1.1 Hgb - 13.4; Plts - 283  Goal of Therapy:  Heparin level 0.3-0.7 units/ml aPTT 66-102 seconds Monitor platelets by anticoagulation protocol: Yes   Plan:  10/16 @ 0412:  HL = 0.81,  aPTT = 85 -  HL elevated still from Eliquis PTA,  aPTT therapeutic X 1 - Will continue pt on current rate and recheck aPTT in 6 hrs on 10/16 @ 1000. - Will recheck HL on 10/17 with AM labs Check Anti-Xa level with AM labs for correlation. (Last dose of eliquis '5mg'$  on 10/14 at 1230, HL interference present still greater than assay 10/15).  Titrate by aPTT's until lab correlation is noted, then titrate by anti-xa alone. Continue to monitor H&H and platelets daily while on heparin gtt.  Natalin Bible D 12/08/2021,7:22 AM

## 2021-12-09 ENCOUNTER — Telehealth: Payer: Self-pay | Admitting: *Deleted

## 2021-12-09 DIAGNOSIS — I480 Paroxysmal atrial fibrillation: Secondary | ICD-10-CM | POA: Diagnosis not present

## 2021-12-09 DIAGNOSIS — K921 Melena: Secondary | ICD-10-CM | POA: Diagnosis not present

## 2021-12-09 DIAGNOSIS — R072 Precordial pain: Secondary | ICD-10-CM | POA: Diagnosis not present

## 2021-12-09 DIAGNOSIS — N179 Acute kidney failure, unspecified: Secondary | ICD-10-CM | POA: Diagnosis not present

## 2021-12-09 DIAGNOSIS — I2489 Other forms of acute ischemic heart disease: Secondary | ICD-10-CM | POA: Diagnosis not present

## 2021-12-09 DIAGNOSIS — I10 Essential (primary) hypertension: Secondary | ICD-10-CM | POA: Diagnosis not present

## 2021-12-09 LAB — CBC
HCT: 39.9 % (ref 36.0–46.0)
Hemoglobin: 13.1 g/dL (ref 12.0–15.0)
MCH: 28.7 pg (ref 26.0–34.0)
MCHC: 32.8 g/dL (ref 30.0–36.0)
MCV: 87.5 fL (ref 80.0–100.0)
Platelets: 270 10*3/uL (ref 150–400)
RBC: 4.56 MIL/uL (ref 3.87–5.11)
RDW: 13.8 % (ref 11.5–15.5)
WBC: 9.6 10*3/uL (ref 4.0–10.5)
nRBC: 0 % (ref 0.0–0.2)

## 2021-12-09 LAB — BASIC METABOLIC PANEL
Anion gap: 5 (ref 5–15)
BUN: 12 mg/dL (ref 6–20)
CO2: 25 mmol/L (ref 22–32)
Calcium: 9.1 mg/dL (ref 8.9–10.3)
Chloride: 107 mmol/L (ref 98–111)
Creatinine, Ser: 0.98 mg/dL (ref 0.44–1.00)
GFR, Estimated: >60 mL/min (ref >60)
Glucose, Bld: 124 mg/dL — ABNORMAL HIGH (ref 70–99)
Potassium: 3.9 mmol/L (ref 3.5–5.1)
Sodium: 137 mmol/L (ref 135–145)

## 2021-12-09 LAB — APTT: aPTT: 29 seconds (ref 24–36)

## 2021-12-09 MED ORDER — ATORVASTATIN CALCIUM 40 MG PO TABS: 40.0000 mg | ORAL_TABLET | Freq: Every day | ORAL | 0 refills | Status: DC

## 2021-12-09 MED ORDER — NITROGLYCERIN 0.4 MG SL SUBL: 0.4000 mg | SUBLINGUAL_TABLET | SUBLINGUAL | 0 refills | Status: AC | PRN

## 2021-12-09 MED ORDER — FUROSEMIDE 20 MG PO TABS: 20.0000 mg | ORAL_TABLET | Freq: Every day | ORAL | 0 refills | Status: DC

## 2021-12-09 MED ORDER — EZETIMIBE 10 MG PO TABS: 10.0000 mg | ORAL_TABLET | Freq: Every day | ORAL | 0 refills | Status: DC

## 2021-12-09 NOTE — Progress Notes (Addendum)
Rounding Note    Patient Name: Heather Dudley Date of Encounter: 12/09/2021  [facility] Cardiologist: Ida Rogue, MD   Subjective   LHC showed nonobstructive CAD. She remains in NSR. Plan for d/c today.  Inpatient Medications    Scheduled Meds:  apixaban  5 mg Oral BID   aspirin EC  81 mg Oral Daily   atorvastatin  40 mg Oral Daily   ezetimibe  10 mg Oral Daily   levothyroxine  75 mcg Oral Q0600   metoprolol succinate  100 mg Oral Daily   sodium chloride flush  3 mL Intravenous Q12H   Continuous Infusions:  sodium chloride     PRN Meds: sodium chloride, acetaminophen **OR** acetaminophen, nitroGLYCERIN, ondansetron **OR** ondansetron (ZOFRAN) IV, sodium chloride flush   Vital Signs    Vitals:   12/08/21 2014 12/08/21 2353 12/09/21 0506 12/09/21 0722  BP: 109/68 133/88 (!) 121/91 123/76  Pulse: 82 78 79 100  Resp: '20 20 20 18  '$ Temp:  98 F (36.7 C) 98 F (36.7 C) 98.1 F (36.7 C)  TempSrc:    Oral  SpO2: 97% 99% 99% 98%  Weight:      Height:        Intake/Output Summary (Last 24 hours) at 12/09/2021 1119 Last data filed at 12/08/2021 1821 Gross per 24 hour  Intake 1052.07 ml  Output 500 ml  Net 552.07 ml      12/08/2021    2:44 PM 12/06/2021   10:27 PM 12/05/2021    3:30 PM  Last 3 Weights  Weight (lbs) 267 lb 267 lb 12.8 oz 268 lb  Weight (kg) 121.11 kg 121.473 kg 121.564 kg      Telemetry    N/A - Personally Reviewed  ECG    No new - Personally Reviewed  Physical Exam   GEN: No acute distress.   Neck: No JVD Cardiac: RRR, no murmurs, rubs, or gallops.  Respiratory: Clear to auscultation bilaterally. GI: Soft, nontender, non-distended  MS: No edema; No deformity. Neuro:  Nonfocal  Psych: Normal affect   Labs    High Sensitivity Troponin:   Recent Labs  Lab 12/05/21 2025 12/05/21 2142 12/06/21 0335 12/06/21 0558 12/06/21 1232  TROPONINIHS 36* 53* 184* 268* 383*     Chemistry Recent Labs  Lab  12/06/21 0335 12/06/21 1232 12/08/21 0412 12/09/21 0927  NA  --  137 138 137  K  --  3.5 3.7 3.9  CL  --  102 107 107  CO2  --  '29 22 25  '$ GLUCOSE  --  113* 103* 124*  BUN  --  '19 16 12  '$ CREATININE  --  1.12* 1.01* 0.98  CALCIUM  --  9.0 8.6* 9.1  MG 1.9  --   --   --   GFRNONAA  --  57* >60 >60  ANIONGAP  --  '6 9 5    '$ Lipids No results for input(s): "CHOL", "TRIG", "HDL", "LABVLDL", "LDLCALC", "CHOLHDL" in the last 168 hours.  Hematology Recent Labs  Lab 12/07/21 0407 12/08/21 0412 12/09/21 0927  WBC 10.9* 10.2 9.6  RBC 4.60 3.96 4.56  HGB 13.1 11.5* 13.1  HCT 40.4 35.9* 39.9  MCV 87.8 90.7 87.5  MCH 28.5 29.0 28.7  MCHC 32.4 32.0 32.8  RDW 13.9 13.8 13.8  PLT 272 227 270   Thyroid  Recent Labs  Lab 12/06/21 0335  TSH 5.447*  FREET4 0.65    BNPNo results for input(s): "BNP", "PROBNP" in  the last 168 hours.  DDimer No results for input(s): "DDIMER" in the last 168 hours.   Radiology    CARDIAC CATHETERIZATION  Result Date: 12/08/2021   The left ventricular systolic function is normal.   LV end diastolic pressure is moderately elevated.   The left ventricular ejection fraction is 55-65% by visual estimate. 1.  Near normal coronary arteries with no significant coronary artery disease. 2.  Normal LV systolic function and moderately elevated left ventricular end-diastolic pressure at 26 mmHg. Recommendations: No culprit is identified for elevated troponin. Elevated troponin is likely due to supply demand ischemia possibly in the setting of underlying arrhythmia and diastolic heart failure. Recommend continuing medical therapy Resume Eliquis tomorrow. Possible discharge home tomorrow with outpatient monitor.    Cardiac Studies   LHC 12/08/21     The left ventricular systolic function is normal.   LV end diastolic pressure is moderately elevated.   The left ventricular ejection fraction is 55-65% by visual estimate.   1.  Near normal coronary arteries with no  significant coronary artery disease. 2.  Normal LV systolic function and moderately elevated left ventricular end-diastolic pressure at 26 mmHg.   Recommendations: No culprit is identified for elevated troponin. Elevated troponin is likely due to supply demand ischemia possibly in the setting of underlying arrhythmia and diastolic heart failure. Recommend continuing medical therapy Resume Eliquis tomorrow. Possible discharge home tomorrow with outpatient monitor.  Echo 06/13/19 1. Left ventricular ejection fraction, by estimation, is 55 to 60%. The  left ventricle has normal function. The left ventricle has no regional  wall motion abnormalities. There is mild left ventricular hypertrophy.  Left ventricular diastolic parameters  are consistent with Grade II diastolic dysfunction (pseudonormalization).  Elevated left atrial pressure.   2. Right ventricular systolic function is normal. The right ventricular  size is normal. There is moderately elevated pulmonary artery systolic  pressure.   3. The mitral valve is normal in structure. Trivial mitral valve  regurgitation. No evidence of mitral stenosis.   4. The aortic valve was not well visualized. Aortic valve regurgitation  is not visualized. No aortic stenosis is present.   5. Pulmonic valve regurgitation not well assessed.   6. The inferior vena cava is dilated in size with <50% respiratory  variability, suggesting right atrial pressure of 15 mmHg.     Patient Profile     61 y.o. female with a h/o afib who is being seen for afib, chest pain and elevated troponin  Assessment & Plan    Elevated troponin - LHC showed no obstructive CAD - suspected supply demand mismatch in the setting of Afib and heart failure - no ASA with Eliquis - cath site stable  Paroxysmal Afib - patient converted to NSR during admission, tele not available for review - PTA Rythmol - could not afford Multaq in the past - continue rate control with  Toprol '100mg'$  daily. Patient is concerned since she had frequent afib episodes with only metoprolol. She may need to see EP to further discuss AA - continue Eliquis '5mg'$  BID - suspect presentation was from Afib  Acute diastolic heart failure - LVEDP was 26 mmHg [facility] -no IV lasix was given - can switch HCTZ to lasix   OSA - reports compliance with CPAP at home  HTN - PTA HCTZ held - continue metoprolol - BP good  For questions or updates, please contact [place] Please consult [email or web address] for contact info under  Signed, Tyiana Hill Ninfa Meeker, PA-C  12/09/2021, 11:19 AM

## 2021-12-09 NOTE — Discharge Summary (Signed)
Physician Discharge Summary   Patient: Heather Dudley MRN: [id number]  DOB: November 10, 1960   Admit:     Date of Admission: 12/05/2021 Admitted from: home   Discharge: Date of discharge: 12/09/21 Disposition: Home Condition at discharge: good  CODE STATUS: FULL CODE     Discharge Physician: Emeterio Reeve, DO [facility]     PCP: Glean Hess, MD  Recommendations for Outpatient Follow-up:  Follow up with PCP Glean Hess, MD in 1-2 weeks -follow-up on vision symptoms, patient states she spoke to her eye care provider who was not concerned, she is not exhibiting stroke symptoms Please obtain labs/tests: CBC, BMP in 1-2 weeks Follow-up as directed with cardiologynone Please follow up on the following pending results: none PCP AND OTHER OUTPATIENT PROVIDERS: SEE BELOW FOR SPECIFIC DISCHARGE INSTRUCTIONS PRINTED FOR PATIENT IN ADDITION TO GENERIC AVS PATIENT INFO    Discharge Instructions     (HEART FAILURE PATIENTS) Call MD:  Anytime you have any of the following symptoms: 1) 3 pound weight gain in 24 hours or 5 pounds in 1 week 2) shortness of breath, with or without a dry hacking cough 3) swelling in the hands, feet or stomach 4) if you have to sleep on extra pillows at night in order to breathe.   Complete by: As directed    Amb referral to AFIB Clinic   Complete by: As directed    Diet - low sodium heart healthy   Complete by: As directed    Discharge instructions   Complete by: As directed    Medication changes (see full list):   For heart -  STOP Hydrochlorothiazide START Furosemide aka Lasix - see instructions RESTART potassium as Lasix can make this low  Your PCP or cardiology team should check labs in 1-2 weeks  For cholesterol and heart attack prevention -  START Atorvastatin aka Lipitor START Ezetimibe aka Zetia Your PCP or cardiology team should check labs in 6 weeks   Increase activity slowly   Complete by: As directed           Discharge Diagnoses: Principal Problem:   Chest pain Active Problems:   Paroxysmal atrial fibrillation with RVR ([place])   Essential hypertension   OSA on CPAP   Obesity, Class II, BMI 35-39.9   Blood in stool   AKI (acute kidney injury) ([place])   NSTEMI (non-ST elevated myocardial infarction) [facility])       Hospital Course: [name] presented 12/05/21 with chest pain. She reports intermittent episodes of chest tightness over the last week, occurring both at rest and with exertion. Her hs Tn trend has been 36 > 53 > 184 >268 >383. Known hx Afib on Eliquis, she remains in afib with rates in the 70s-100s. Denies Cardiology following, treating NSTEMI on heparin, pt considering cardiac cath, echo pending.  Decided to go forward with cardiac cath, this is planned for 10/16. Echo resulted LVEF 55-60, no RWMA, normal diastolic parameters  Cardiac Cath 10/16 showed near normal coronary arteries with no obstructive disease.  Left ventricular end-diastolic pressure was moderately elevated at 26 mmHg.. Cardiology recommending discharge tomorrow, symptoms likely d/t Afib   Consultants:  Cardiology  Procedures: Echocardiogram 12/07/21: LVEF 55-60, no RWMA, normal diastolic parameters Cardiac Cath 12/08/21 showed near normal coronary arteries with no obstructive disease.  Left ventricular end-diastolic pressure was moderately elevated at 26 mmHg.      ASSESSMENT & PLAN:   Principal Problem:   Chest pain Active Problems:  Paroxysmal atrial fibrillation with RVR ([facility])   Essential hypertension   OSA on CPAP   Obesity, Class II, BMI 35-39.9   Blood in stool   AKI (acute kidney injury) ([place])   NSTEMI (non-ST elevated myocardial infarction) ([facility])   Chest pain Elevated troponin d/t supply/demand mismatch - MI ruled out  Complicated by atrial fibrillation with RVR, now rate controlled Suspect that chest pain was due to A-fib with RVR and acute on chronic diastolic heart  failure. Heparin gtt per cardiology  Rate control w/ metoprolol  Cardiac cath done, see above  Echo done, no concerns, see above   Resume Eliquis on discharge  Hypertension Blood pressure is stable Continue metoprolol for HTN and Afib rate control  Switch HCTZ to Lasix on discharge Can add amlodipine outpatient if needed   Acute diastolic heart failure likely in the setting of atrial fibrillation:  LVEDP was 26 at the time of cardiac catheterization.   witching hydrochlorothiazide to furosemide on hospital discharge.  Hypokalemia - resolved  Repleted Follow AM BMP  OSA - stable  Continue CPAP nightly   Acute kidney injury - resolved Mild elevation of creatinine 1.12 --> improved to 1.01 [place] BMP    Bilateral lower extremity edema Echocardiogram as above no CHF   Vision changes consistent with scotoma/floaters No temporal pain No total vision loss Ddx would include optic nerve problem or blood upply to optic nerve compromise, MS, other neurologic or optic pathology  Follow outpatient Pt did note episode few weeks ago of "grey" vision which resovled on its own  Stroke precautions reviewed Pt declined to stay in the hospital for additional imaging             Discharge Instructions  Allergies as of 12/09/2021       Reactions   Ace Inhibitors Swelling   Sernivo [betamethasone Dipropionate] Itching   Penicillins Rash   Has patient had a PCN reaction causing immediate rash, facial/tongue/throat swelling, SOB or lightheadedness with hypotension: Yes Has patient had a PCN reaction causing severe rash involving mucus membranes or skin necrosis: No Has patient had a PCN reaction that required hospitalization: No Has patient had a PCN reaction occurring within the last 10 years: No If all of the above answers are "NO", then may proceed with Cephalosporin use.        Medication List     STOP taking these medications    hydrochlorothiazide 25 MG  tablet Commonly known as: HYDRODIURIL   propafenone 225 MG 12 hr capsule Commonly known as: RYTHMOL SR   propranolol ER 60 MG 24 hr capsule Commonly known as: Inderal LA       TAKE these medications    apixaban 5 MG Tabs tablet Commonly known as: Eliquis Take 1 tablet (5 mg total) by mouth 2 (two) times daily.   atorvastatin 40 MG tablet Commonly known as: LIPITOR Take 1 tablet (40 mg total) by mouth daily. Start taking on: December 10, 2021   B-COMPLEX PO Take 1 tablet by mouth daily.   ezetimibe 10 MG tablet Commonly known as: ZETIA Take 1 tablet (10 mg total) by mouth daily. Start taking on: December 10, 2021   furosemide 20 MG tablet Commonly known as: LASIX Take 1 tablet (20 mg total) by mouth daily. Increase to 1 tablet (20 mg total) by mouth TWICE daily (total daily dose 40 mg) as needed for up to 3 days for increased leg swelling, shortness of breath, weight gain 5+ lbs over 1-2 days.  Seek medical care if these symptoms are not improving with increased dose.   levothyroxine 75 MCG tablet Commonly known as: SYNTHROID Take 75 mcg by mouth daily.   LORazepam 0.5 MG tablet Commonly known as: ATIVAN TAKE 1 TABLET BY MOUTH EVERY 8 HOURS AS NEEDED FOR ANXIETY.   metoprolol succinate 100 MG 24 hr tablet Commonly known as: TOPROL-XL Take 100 mg by mouth daily. Take with or immediately following a meal.   multivitamin tablet Take 1 tablet by mouth daily.   nitroGLYCERIN 0.4 MG SL tablet Commonly known as: NITROSTAT Place 1 tablet (0.4 mg total) under the tongue every 5 (five) minutes as needed for chest pain.   potassium chloride SA 20 MEQ tablet Commonly known as: KLOR-CON M Take 1 tablet (20 mEq total) by mouth daily.          Allergies  Allergen Reactions   Ace Inhibitors Swelling   Sernivo [Betamethasone Dipropionate] Itching   Penicillins Rash    Has patient had a PCN reaction causing immediate rash, facial/tongue/throat swelling, SOB or  lightheadedness with hypotension: Yes Has patient had a PCN reaction causing severe rash involving mucus membranes or skin necrosis: No Has patient had a PCN reaction that required hospitalization: No Has patient had a PCN reaction occurring within the last 10 years: No If all of the above answers are "NO", then may proceed with Cephalosporin use.     Subjective: On rounds this morning, patient has no complaints, no chest pain/shortness of breath, no headache, dizziness, other concerns.  Tolerating diet.  Ambulating independently.  Patient was set for discharge, cardiology rounded and patient was reporting some transient vision changes after cardiac cath yesterday, seeing strange lights/shapes, reported similar episode in her left eye few weeks ago while at work which was also transient, resolved spontaneously.     Discharge Exam: BP 137/68 (BP Location: Left Leg)   Pulse 87   Temp (!) 97.5 F (36.4 C)   Resp 18   Ht '5\' 7"'$  (1.702 m)   Wt 121.1 kg   LMP 04/01/2008   SpO2 100%   BMI 41.82 kg/m  General: Pt is alert, awake, not in acute distress Cardiovascular: RRR, S1/S2 +, no rubs, no gallops Respiratory: CTA bilaterally, no wheezing, no rhonchi Abdominal: Soft, NT, ND, bowel sounds + Extremities: no edema, no cyanosis     The results of significant diagnostics from this hospitalization (including imaging, microbiology, ancillary and laboratory) are listed below for reference.     Microbiology: No results found for this or any previous visit (from the past 240 hour(s)).   Labs: BNP (last 3 results) No results for input(s): "BNP" in the last 8760 hours. Basic Metabolic Panel: Recent Labs  Lab 12/05/21 1533 12/06/21 0335 12/06/21 1232 12/08/21 0412 12/09/21 0927  NA 138  --  137 138 137  K 3.3*  --  3.5 3.7 3.9  CL 100  --  102 107 107  CO2 28  --  '29 22 25  '$ GLUCOSE 120*  --  113* 103* 124*  BUN 16  --  '19 16 12  '$ CREATININE 1.14*  --  1.12* 1.01* 0.98  CALCIUM 9.4   --  9.0 8.6* 9.1  MG  --  1.9  --   --   --    Liver Function Tests: No results for input(s): "AST", "ALT", "ALKPHOS", "BILITOT", "PROT", "ALBUMIN" in the last 168 hours. No results for input(s): "LIPASE", "AMYLASE" in the last 168 hours. No results for input(s): "AMMONIA"  in the last 168 hours. CBC: Recent Labs  Lab 12/05/21 1533 12/07/21 0407 12/08/21 0412 12/09/21 0927  WBC 9.3 10.9* 10.2 9.6  HGB 13.4 13.1 11.5* 13.1  HCT 42.0 40.4 35.9* 39.9  MCV 89.9 87.8 90.7 87.5  PLT 283 272 227 270   Cardiac Enzymes: No results for input(s): "CKTOTAL", "CKMB", "CKMBINDEX", "TROPONINI" in the last 168 hours. BNP: Invalid input(s): "POCBNP" CBG: No results for input(s): "GLUCAP" in the last 168 hours. D-Dimer No results for input(s): "DDIMER" in the last 72 hours. Hgb A1c No results for input(s): "HGBA1C" in the last 72 hours. Lipid Profile No results for input(s): "CHOL", "HDL", "LDLCALC", "TRIG", "CHOLHDL", "LDLDIRECT" in the last 72 hours. Thyroid function studies No results for input(s): "TSH", "T4TOTAL", "T3FREE", "THYROIDAB" in the last 72 hours.  Invalid input(s): "FREET3" Anemia work up No results for input(s): "VITAMINB12", "FOLATE", "FERRITIN", "TIBC", "IRON", "RETICCTPCT" in the last 72 hours. Urinalysis    Component Value Date/Time   COLORURINE YELLOW (A) 04/16/2015 1237   APPEARANCEUR CLEAR (A) 04/16/2015 1237   APPEARANCEUR Hazy 04/05/2011 1311   LABSPEC 1.008 04/16/2015 1237   LABSPEC 1.005 04/05/2011 1311   PHURINE 6.0 04/16/2015 1237   GLUCOSEU NEGATIVE 04/16/2015 1237   GLUCOSEU Negative 04/05/2011 1311   HGBUR 3+ (A) 04/16/2015 1237   BILIRUBINUR neg 07/15/2021 1029   BILIRUBINUR Negative 04/05/2011 1311   KETONESUR NEGATIVE 04/16/2015 1237   PROTEINUR Positive (A) 07/15/2021 1029   PROTEINUR 30 (A) 04/16/2015 1237   UROBILINOGEN 0.2 07/15/2021 1029   NITRITE neg 07/15/2021 1029   NITRITE NEGATIVE 04/16/2015 1237   LEUKOCYTESUR Negative 07/15/2021  1029   LEUKOCYTESUR Negative 04/05/2011 1311   Sepsis Labs Recent Labs  Lab 12/05/21 1533 12/07/21 0407 12/08/21 0412 12/09/21 0927  WBC 9.3 10.9* 10.2 9.6   Microbiology No results found for this or any previous visit (from the past 240 hour(s)). Imaging CARDIAC CATHETERIZATION  Result Date: 12/08/2021   The left ventricular systolic function is normal.   LV end diastolic pressure is moderately elevated.   The left ventricular ejection fraction is 55-65% by visual estimate. 1.  Near normal coronary arteries with no significant coronary artery disease. 2.  Normal LV systolic function and moderately elevated left ventricular end-diastolic pressure at 26 mmHg. Recommendations: No culprit is identified for elevated troponin. Elevated troponin is likely due to supply demand ischemia possibly in the setting of underlying arrhythmia and diastolic heart failure. Recommend continuing medical therapy Resume Eliquis tomorrow. Possible discharge home tomorrow with outpatient monitor.   ECHOCARDIOGRAM COMPLETE  Result Date: 12/07/2021    ECHOCARDIOGRAM REPORT   Patient Name:   JEM CASTRO Date of Exam: 12/07/2021 Medical Rec #:  [id number]            Height:       67.0 in Accession #:    [id number]           Weight:       267.8 lb Date of Birth:  [birth year]            BSA:          2.289 m Patient Age:    61 years             BP:           121/62 mmHg Patient Gender: F                    HR:  64 bpm. Exam Location:  [facility] Procedure: 2D Echo Indications:     NSTEMI I21.4  History:         Patient has prior history of Echocardiogram examinations, most                  recent 06/13/2019.  Sonographer:     Ronny Flurry Referring Phys:  [id number] [place] Diagnosing Phys: Buford Dresser MD IMPRESSIONS  1. Left ventricular ejection fraction, by estimation, is 55 to 60%. The left ventricle has normal function. The left ventricle has no regional wall motion abnormalities. Left  ventricular diastolic parameters were normal.  2. Right ventricular systolic function is normal. The right ventricular size is normal. There is normal pulmonary artery systolic pressure.  3. Left atrial size was mildly dilated.  4. The mitral valve is normal in structure. Trivial mitral valve regurgitation. No evidence of mitral stenosis.  5. The aortic valve is tricuspid. Aortic valve regurgitation is not visualized. No aortic stenosis is present.  6. The inferior vena cava is normal in size with greater than 50% respiratory variability, suggesting right atrial pressure of 3 mmHg. Comparison(s): No significant change from prior study. Conclusion(s)/Recommendation(s): Normal biventricular function without evidence of hemodynamically significant valvular heart disease. FINDINGS  Left Ventricle: Left ventricular ejection fraction, by estimation, is 55 to 60%. The left ventricle has normal function. The left ventricle has no regional wall motion abnormalities. The left ventricular internal cavity size was normal in size. There is  no left ventricular hypertrophy. Left ventricular diastolic parameters were normal. Right Ventricle: The right ventricular size is normal. No increase in right ventricular wall thickness. Right ventricular systolic function is normal. There is normal pulmonary artery systolic pressure. The tricuspid regurgitant velocity is 2.23 m/s, and  with an assumed right atrial pressure of 3 mmHg, the estimated right ventricular systolic pressure is 25.4 mmHg. Left Atrium: Left atrial size was mildly dilated. Right Atrium: Right atrial size was normal in size. Prominent Eustachian valve. Pericardium: There is no evidence of pericardial effusion. Mitral Valve: The mitral valve is normal in structure. Trivial mitral valve regurgitation. No evidence of mitral valve stenosis. Tricuspid Valve: The tricuspid valve is normal in structure. Tricuspid valve regurgitation is trivial. No evidence of tricuspid  stenosis. Aortic Valve: The aortic valve is tricuspid. Aortic valve regurgitation is not visualized. No aortic stenosis is present. Aortic valve mean gradient measures 4.0 mmHg. Aortic valve peak gradient measures 7.8 mmHg. Aortic valve area, by VTI measures 2.19 cm. Pulmonic Valve: The pulmonic valve was not well visualized. Pulmonic valve regurgitation is trivial. No evidence of pulmonic stenosis. Aorta: The aortic root, ascending aorta, aortic arch and descending aorta are all structurally normal, with no evidence of dilitation or obstruction. Venous: The inferior vena cava is normal in size with greater than 50% respiratory variability, suggesting right atrial pressure of 3 mmHg. IAS/Shunts: The atrial septum is grossly normal.  LEFT VENTRICLE PLAX 2D LVIDd:         4.30 cm   Diastology LVIDs:         3.20 cm   LV e' medial:    8.59 cm/s LV PW:         0.90 cm   LV E/e' medial:  12.0 LV IVS:        1.00 cm   LV e' lateral:   11.70 cm/s LVOT diam:     2.10 cm   LV E/e' lateral: 8.8 LV SV:  68 LV SV Index:   30 LVOT Area:     3.46 cm  RIGHT VENTRICLE RV S prime:     8.59 cm/s TAPSE (M-mode): 1.7 cm LEFT ATRIUM             Index        RIGHT ATRIUM           Index LA diam:        4.00 cm 1.75 cm/m   RA Area:     16.90 cm LA Vol (A2C):   66.9 ml 29.22 ml/m  RA Volume:   46.60 ml  20.35 ml/m LA Vol (A4C):   56.6 ml 24.72 ml/m LA Biplane Vol: 67.2 ml 29.35 ml/m  AORTIC VALVE AV Area (Vmax):    2.20 cm AV Area (Vmean):   2.20 cm AV Area (VTI):     2.19 cm AV Vmax:           140.00 cm/s AV Vmean:          97.500 cm/s AV VTI:            0.311 m AV Peak Grad:      7.8 mmHg AV Mean Grad:      4.0 mmHg LVOT Vmax:         89.10 cm/s LVOT Vmean:        61.800 cm/s LVOT VTI:          0.197 m LVOT/AV VTI ratio: 0.63  AORTA Ao Root diam: 3.10 cm Ao Asc diam:  3.70 cm MITRAL VALVE                TRICUSPID VALVE MV Area (PHT): 3.85 cm     TR Peak grad:   19.9 mmHg MV Decel Time: 197 msec     TR Vmax:         223.00 cm/s MV E velocity: 103.00 cm/s MV A velocity: 52.90 cm/s   SHUNTS MV E/A ratio:  1.95         Systemic VTI:  0.20 m                             Systemic Diam: 2.10 cm Buford Dresser MD Electronically signed by Buford Dresser MD Signature Date/Time: 12/07/2021/4:59:24 PM    Final       Time coordinating discharge: over 30 minutes  SIGNED:  Emeterio Reeve DO [facility]

## 2021-12-09 NOTE — Telephone Encounter (Signed)
-----   Message from Phelps, PA-C sent at 12/09/2021 11:50 AM EDT ----- Regarding: hosp follow-up Pt needs hospital follow-up in 2-3 weeks. Also needs referral to EP for Afib.

## 2021-12-09 NOTE — Telephone Encounter (Signed)
To scheduling to arrange for a follow up in 2-3 weeks with general cardiology. EP referral placed to Dr. Quentin Ore for scheduling as well.

## 2021-12-10 ENCOUNTER — Ambulatory Visit: Payer: BC Managed Care – PPO | Attending: Cardiology | Admitting: Cardiology

## 2021-12-10 ENCOUNTER — Telehealth: Payer: Self-pay

## 2021-12-10 ENCOUNTER — Encounter: Payer: Self-pay | Admitting: Cardiology

## 2021-12-10 VITALS — BP 122/86 | HR 107 | Ht 67.0 in | Wt 267.0 lb

## 2021-12-10 DIAGNOSIS — Z01818 Encounter for other preprocedural examination: Secondary | ICD-10-CM

## 2021-12-10 DIAGNOSIS — G4733 Obstructive sleep apnea (adult) (pediatric): Secondary | ICD-10-CM

## 2021-12-10 DIAGNOSIS — I4819 Other persistent atrial fibrillation: Secondary | ICD-10-CM

## 2021-12-10 DIAGNOSIS — I10 Essential (primary) hypertension: Secondary | ICD-10-CM

## 2021-12-10 MED ORDER — METOPROLOL SUCCINATE ER 50 MG PO TB24: 75.0000 mg | ORAL_TABLET | Freq: Two times a day (BID) | ORAL | 3 refills | Status: DC

## 2021-12-10 NOTE — Telephone Encounter (Signed)
Transition Care Management Follow-up Telephone Call Date of discharge and from where: [facility] 12/09/2021 How have you been since you were released from the hospital? Patient feeling better than she was a few days ago. Pt seen cardiology today. She will have a cardiac ablation in December. Any questions or concerns? No  Items Reviewed: Did the pt receive and understand the discharge instructions provided? Yes  Medications obtained and verified? Yes  Other? No  Any new allergies since your discharge? No  Dietary orders reviewed? Yes Do you have support at home? Yes   Home Care and Equipment/Supplies: Were home health services ordered? not applicable  Functional Questionnaire: (I = Independent and D = Dependent) ADLs: I  Bathing/Dressing- I  Meal Prep- I  Eating- I  Maintaining continence- I  Transferring/Ambulation- I  Managing Meds- I  Follow up appointments reviewed:  PCP Hospital f/u appt confirmed? YES - 12/16/2021 - 2 PM with Dr Halina Maidens Specialist [facility] f/u appt confirmed? Yes  Scheduled to see Dr Quentin Ore on 12/10/2021. Are transportation arrangements needed? No  If their condition worsens, is the pt aware to call PCP or go to the Emergency Dept.? Yes Was the patient provided with contact information for the PCP's office or ED? Yes Was to pt encouraged to call back with questions or concerns? Yes

## 2021-12-10 NOTE — Patient Instructions (Addendum)
Medication Instructions:  Increase your Metoprolol Succinate to 75 mg two times daily *If you need a refill on your cardiac medications before your next appointment, please call your pharmacy*   Lab Work: Anytime between Dec 4-8  Nature conservation officer at [facility] 1st desk on the right to check in Lab hours: 7:30 am- 5:30 pm (walk in basis) You do not need to be fasting.  If you have labs (blood work) drawn today and your tests are completely normal, you will receive your results only by: [place] (if you have MyChart) OR A paper copy in the mail If you have any lab test that is abnormal or we need to change your treatment, we will call you to review the results.   Testing/Procedures: Your physician has requested that you have cardiac CT. Cardiac computed tomography (CT) is a painless test that uses an x-ray machine to take clear, detailed pictures of your heart. For further information please visit [email or web address]. Please follow instruction sheet as given.  Your physician has recommended that you have an ablation. Catheter ablation is a medical procedure used to treat some cardiac arrhythmias (irregular heartbeats). During catheter ablation, a long, thin, flexible tube is put into a blood vessel in your groin (upper thigh), or neck. This tube is called an ablation catheter. It is then guided to your heart through the blood vessel. Radio frequency waves destroy small areas of heart tissue where abnormal heartbeats may cause an arrhythmia to start. Please see the instruction sheet given to you today.   Follow-Up: At [facility], you and your health needs are our priority.  As part of our continuing mission to provide you with exceptional heart care, we have created designated Provider Care Teams.  These Care Teams include your primary Cardiologist (physician) and Advanced Practice Providers (APPs -  Physician Assistants and Nurse Practitioners) who all work together to provide  you with the care you need, when you need it.  We recommend signing up for the patient portal called "MyChart".  Sign up information is provided on this After Visit Summary.  MyChart is used to connect with patients for Virtual Visits (Telemedicine).  Patients are able to view lab/test results, encounter notes, upcoming appointments, etc.  Non-urgent messages can be sent to your provider as well.   To learn more about what you can do with MyChart, go to [email or web address].    Your next appointment:   AFib Ablation date you have picked is Dec 28. EP staff with be in touch with you with your CT and ablation instructions over mychart.    Important Information About Sugar

## 2021-12-10 NOTE — Progress Notes (Signed)
Electrophysiology Office Note:    Date:  12/10/2021   ID:  Heather Dudley, DOB 08/31/1960, MRN [id number]  PCP:  Glean Hess, MD  [facility] Cardiologist:  Ida Rogue, MD  [facility] Electrophysiologist:  Vickie Epley, MD   Referring MD: Antony Madura, PA-C   Chief Complaint: AF  History of Present Illness:    Heather Dudley is a 61 y.o. female who presents for an evaluation of AF at the request of Cadence Furth, PA-C. Their medical history includes HTN, breast CA, OSA on CPAP and pAF. She was previously prescribed multaq but this medication was never filled due to excessive cost. Next she was prescribed propafenone but this makes her tired and thus she takes only once per day. Flecainide caused her to feel "scattered".   She was admitted 12/05/2021 with chest pain. Troponins mildly elevated. Cath without obstructive disease. Her symptoms were thought to be secondary to AF w/ RVR. She takes eliquis for stroke ppx.  She is with her husband today in clinic.      Past Medical History:  Diagnosis Date   Basal cell carcinoma of forehead    birthmark of forehead   Breast cancer (HCC)    Breast screening, unspecified    Cellulitis and abscess of trunk    Diastolic dysfunction    a. 05/2019 Echo: EF 55-60%, no rwma, mild LVH, Gr2 DD, Nl RV size/fxn. Triv MR.   Hernia    History of chemotherapy 2012   Hypertension    Lump or mass in breast    Malignant neoplasm of breast (female), unspecified site    She underwent wide excision, mastoplasty and axillary dissection for her left breast malignancy on September 03, 2010. The primary tumor was 1 cm in diameter, and a single positive axillary node 1.3 cm in diameter. This was an ER-positive, PR slightly positive, HER-2/neu not overexpressing tumor. She tolerated her whole breast radiation without difficulty ending in late February 2013.   Malignant neoplasm of upper-outer quadrant of female breast ([place])     Neoplasm of uncertain behavior of connective and other soft tissue    Obesity, unspecified    PAF (paroxysmal atrial fibrillation) ([place]) 05/04/2017   a. 04/2019 Zio: RSR, avg HR 79. 2% PAF burden (73-175, avg 115; longest 68mins 28secs). CHA2DS2VASc = 2-->Xarelto.   Personal history of chemotherapy    Personal history of malignant neoplasm of breast    The patient underwent wide excision, mastoplasty. Dissection for a T1b, N1, M0 carcinoma left breast on September 03, 2010.The primary tumor was 1 cm in diameter, and a single positive axillary node 1.3 cm in diameter. This was an ER-positive, PR slightly positive, HER-2/neu not overexpressing tumor.  The patient completed whole breast radiation in February 2013.   Personal history of radiation therapy 2013   LEFT lumpectomy   Screening for obesity    Thyroid nodule     Past Surgical History:  Procedure Laterality Date   ABDOMINAL HYSTERECTOMY  2011   ovaries remain   birth mark removed     BREAST CYST ASPIRATION Right    negative 01/2010   BREAST EXCISIONAL BIOPSY  08/05/2010   left breast positive 07/2010   BREAST LUMPECTOMY  2012   left breast   COLONOSCOPY  07/29/2012   normal study.   LEFT HEART CATH AND CORONARY ANGIOGRAPHY N/A 12/08/2021   Procedure: LEFT HEART CATH AND CORONARY ANGIOGRAPHY;  Surgeon: Wellington Hampshire, MD;  Location: [place] [facility];  Service: Cardiovascular;  Laterality: N/A;   robotic tonsillectomy Lingual tonsils Bilateral 06/08/2016   THYROID SURGERY     partially removed   TUBAL LIGATION      Current Medications: Current Meds  Medication Sig   apixaban (ELIQUIS) 5 MG TABS tablet Take 1 tablet (5 mg total) by mouth 2 (two) times daily.   atorvastatin (LIPITOR) 40 MG tablet Take 1 tablet (40 mg total) by mouth daily.   B Complex-Biotin-FA (B-COMPLEX PO) Take 1 tablet by mouth daily.   ezetimibe (ZETIA) 10 MG tablet Take 1 tablet (10 mg total) by mouth daily.   furosemide (LASIX) 20 MG tablet Take 1  tablet (20 mg total) by mouth daily. Increase to 1 tablet (20 mg total) by mouth TWICE daily (total daily dose 40 mg) as needed for up to 3 days for increased leg swelling, shortness of breath, weight gain 5+ lbs over 1-2 days. Seek medical care if these symptoms are not improving with increased dose.   levothyroxine (SYNTHROID) 75 MCG tablet Take 75 mcg by mouth daily.   LORazepam (ATIVAN) 0.5 MG tablet TAKE 1 TABLET BY MOUTH EVERY 8 HOURS AS NEEDED FOR ANXIETY.   metoprolol succinate (TOPROL-XL) 50 MG 24 hr tablet Take 1.5 tablets (75 mg total) by mouth 2 (two) times daily. Take with or immediately following a meal.   Multiple Vitamin (MULTIVITAMIN) tablet Take 1 tablet by mouth daily.   nitroGLYCERIN (NITROSTAT) 0.4 MG SL tablet Place 1 tablet (0.4 mg total) under the tongue every 5 (five) minutes as needed for chest pain.   potassium chloride SA (KLOR-CON M) 20 MEQ tablet Take 1 tablet (20 mEq total) by mouth daily.   propafenone (RYTHMOL SR) 225 MG 12 hr capsule Take 225 mg by mouth 2 (two) times daily.   [DISCONTINUED] metoprolol succinate (TOPROL-XL) 100 MG 24 hr tablet Take 100 mg by mouth daily. Take with or immediately following a meal.     Allergies:   Ace inhibitors, Sernivo [betamethasone dipropionate], and Penicillins   Social History   Socioeconomic History   Marital status: Married    Spouse name: Ossie Kroeger   Number of children: 3   Years of education: 16   Highest education level: Bachelor's degree (e.g., BA, AB, BS)  Occupational History   Not on file  Tobacco Use   Smoking status: Never   Smokeless tobacco: Never  Vaping Use   Vaping Use: Never used  Substance and Sexual Activity   Alcohol use: Not Currently    Comment: occ   Drug use: Never   Sexual activity: Yes    Partners: Male    Birth control/protection: Surgical  Other Topics Concern   Not on file  Social History Narrative   Not on file   Social Determinants of Health   Financial Resource  Strain: Low Risk  (07/15/2021)   Overall Financial Resource Strain (CARDIA)    Difficulty of Paying Living Expenses: Not hard at all  Food Insecurity: No Food Insecurity (12/06/2021)   Hunger Vital Sign    Worried About Running Out of Food in the Last Year: Never true    [place] in the Last Year: Never true  Transportation Needs: No Transportation Needs (12/06/2021)   PRAPARE - Hydrologist (Medical): No    Lack of Transportation (Non-Medical): No  Physical Activity: Not on file  Stress: Not on file  Social Connections: Not on file     Family History: The patient's  family history includes Cancer in her cousin, maternal uncle, mother, and paternal aunt; Colon cancer in an other family member; Heart failure in her father; Ovarian cancer in an other family member. There is no history of Breast cancer.  ROS:   Please see the history of present illness.    All other systems reviewed and are negative.  EKGs/Labs/Other Studies Reviewed:    The following studies were reviewed today:    EKG:  The ekg ordered today demonstrates atrial fibrillation with a ventricular rate of 107 bpm.  The QRS is narrow, 86 ms.   Recent Labs: 07/15/2021: ALT 12 12/06/2021: Magnesium 1.9; TSH 5.447 12/09/2021: BUN 12; Creatinine, Ser 0.98; Hemoglobin 13.1; Platelets 270; Potassium 3.9; Sodium 137  Recent Lipid Panel    Component Value Date/Time   CHOL 271 (H) 07/15/2021 1039   TRIG 133 07/15/2021 1039   HDL 44 07/15/2021 1039   CHOLHDL 6.2 (H) 07/15/2021 1039   LDLCALC 203 (H) 07/15/2021 1039    Physical Exam:    VS:  BP 122/86   Pulse (!) 107   Ht $R'5\' 7"'VV$  (1.702 m)   Wt 267 lb (121.1 kg)   LMP 04/01/2008   SpO2 96%   BMI 41.82 kg/m     Wt Readings from Last 3 Encounters:  12/10/21 267 lb (121.1 kg)  12/08/21 267 lb (121.1 kg)  09/23/21 272 lb 12.8 oz (123.7 kg)     GEN:  Well nourished, well developed in no acute distress.  Obese HEENT: Normal NECK:  No JVD; No carotid bruits LYMPHATICS: No lymphadenopathy CARDIAC: Irregularly irregular, no murmurs, rubs, gallops RESPIRATORY:  Clear to auscultation without rales, wheezing or rhonchi  ABDOMEN: Soft, non-tender, non-distended MUSCULOSKELETAL:  No edema; No deformity  SKIN: Warm and dry NEUROLOGIC:  Alert and oriented x 3 PSYCHIATRIC:  Normal affect       ASSESSMENT:    1. Paroxysmal atrial fibrillation with RVR ([place])   2. Essential hypertension   3. OSA on CPAP   4. Pre-op evaluation    PLAN:    In order of problems listed above:  #Persistent atrial fibrillation Symptomatic.  Resistant despite treatment with flecainide and propafenone in the past.  Her rates are poorly controlled.  She is on Eliquis for stroke prophylaxis.  I will increase her metoprolol succinate to 75 mg by mouth twice daily to see if we can improve her rate control.  Discussed treatment options today for her AF including antiarrhythmic drug therapy and ablation. Discussed risks, recovery and likelihood of success. Discussed potential need for repeat ablation procedures and antiarrhythmic drugs after an initial ablation. They wish to proceed with scheduling.  Risk, benefits, and alternatives to EP study and radiofrequency ablation for afib were also discussed in detail today. These risks include but are not limited to stroke, bleeding, vascular damage, tamponade, perforation, damage to the esophagus, lungs, and other structures, pulmonary vein stenosis, worsening renal function, and death. The patient understands these risk and wishes to proceed.  We will therefore proceed with catheter ablation at the next available time.  Carto, ICE, anesthesia are requested for the procedure.  Will also obtain CT PV protocol prior to the procedure to exclude LAA thrombus and further evaluate atrial anatomy.  #Hypertension At goal today.  Recommend checking blood pressures 1-2 times per week at home and recording the values.   Recommend bringing these recordings to the primary care physician.  #OSA on CPAP CPAP compliance encouraged.   Medication Adjustments/Labs and Tests Ordered: Current  medicines are reviewed at length with the patient today.  Concerns regarding medicines are outlined above.  Orders Placed This Encounter  Procedures   CT CARDIAC MORPH/PULM VEIN W/CM&W/O CA SCORE   CBC w/Diff   Basic Metabolic Panel (BMET)   EKG 12-Lead   Meds ordered this encounter  Medications   metoprolol succinate (TOPROL-XL) 50 MG 24 hr tablet    Sig: Take 1.5 tablets (75 mg total) by mouth 2 (two) times daily. Take with or immediately following a meal.    Dispense:  270 tablet    Refill:  3     Signed, Tavie Haseman T. Quentin Ore, MD, [facility] 12/10/2021 1:18 PM    Electrophysiology [facility]

## 2021-12-11 ENCOUNTER — Telehealth: Payer: Self-pay | Admitting: Cardiology

## 2021-12-11 NOTE — Telephone Encounter (Signed)
Cadence, are you able to do a letter for here/ answer her question as to when she can return to work?

## 2021-12-11 NOTE — Telephone Encounter (Signed)
New Message:    Patient says she have been in the hospital and was discharged on 12-09-21. She says she needs a note for work stating that she was in the hospital and that she is still under the doctor care. She has a follow up visit with Cadence on 12-26-21, She said once the letter is completed, please put it in My Chart please. She wanted to know when she would be able to return to work?

## 2021-12-12 NOTE — Telephone Encounter (Signed)
Antony Madura, PA-C  Sent: Fri December 12, 2021  1:07 PM  To: Emily Filbert, RN          Message  She can have a work note for the days she was in the hospital and 5 days post-hospitalization.     It looks most recently saw Dr Quentin Ore 10/18

## 2021-12-15 NOTE — Telephone Encounter (Signed)
Letter written as requested and sent to patients MyChart. Also sent patient a separate MyChart informing her of this.

## 2021-12-16 ENCOUNTER — Encounter: Payer: Self-pay | Admitting: Internal Medicine

## 2021-12-16 ENCOUNTER — Telehealth: Payer: Self-pay | Admitting: Cardiology

## 2021-12-16 ENCOUNTER — Ambulatory Visit: Payer: BC Managed Care – PPO | Admitting: Internal Medicine

## 2021-12-16 VITALS — BP 138/78 | HR 71 | Ht 67.0 in | Wt 270.0 lb

## 2021-12-16 DIAGNOSIS — G43C0 Periodic headache syndromes in child or adult, not intractable: Secondary | ICD-10-CM

## 2021-12-16 DIAGNOSIS — R7303 Prediabetes: Secondary | ICD-10-CM | POA: Diagnosis not present

## 2021-12-16 DIAGNOSIS — I2510 Atherosclerotic heart disease of native coronary artery without angina pectoris: Secondary | ICD-10-CM | POA: Diagnosis not present

## 2021-12-16 DIAGNOSIS — I480 Paroxysmal atrial fibrillation: Secondary | ICD-10-CM

## 2021-12-16 DIAGNOSIS — I214 Non-ST elevation (NSTEMI) myocardial infarction: Secondary | ICD-10-CM

## 2021-12-16 DIAGNOSIS — F411 Generalized anxiety disorder: Secondary | ICD-10-CM

## 2021-12-16 MED ORDER — LORAZEPAM 0.5 MG PO TABS: 0.5000 mg | ORAL_TABLET | Freq: Three times a day (TID) | ORAL | 0 refills | Status: DC | PRN

## 2021-12-16 NOTE — Progress Notes (Signed)
Date:  12/16/2021   Name:  Heather Dudley   DOB:  November 07, 1960   MRN:  [id number]   Chief Complaint: Hospitalization Follow-up (Patient says she is feeling okay, just fatigued. ) Hospital follow up.  Admitted to [facility] 12/05/21 to 12/09/21.TOC call done 1018/23. She was seen by cardiology on 12/10/21.  Metoprolol was increased to 75 mg bid and referral for EP study and possible RF Ablation.  Procedure scheduled for 02/19/22.  Discharge Diagnoses: Principal Problem:   Chest pain Active Problems:   Paroxysmal atrial fibrillation with RVR (HCC)   Essential hypertension   OSA on CPAP   Obesity, Class II, BMI 35-39.9   Blood in stool   AKI (acute kidney injury) ([place])   NSTEMI (non-ST elevated myocardial infarction) ([place])    Procedures: Echocardiogram 12/07/21: LVEF 55-60, no RWMA, normal diastolic parameters Cardiac Cath 12/08/21 showed near normal coronary arteries with no obstructive disease.  Left ventricular end-diastolic pressure was moderately elevated at 26 mmHg.  Recommendations for Outpatient Follow-up:  Follow up with PCP Glean Hess, MD in 1-2 weeks -follow-up on vision symptoms, patient states she spoke to her eye care provider who was not concerned, she is not exhibiting stroke symptoms Please obtain labs/tests: CBC, BMP in 1-2 weeks Follow-up as directed with cardiologynone Please follow up on the following pending results: none PCP AND OTHER OUTPATIENT PROVIDERS: SEE [place] AVS PATIENT INFO    Medication changes (see full list):    For heart -  STOP Hydrochlorothiazide START Furosemide aka Lasix - see instructions RESTART potassium as Lasix can make this low  Your PCP or cardiology team should check labs in 1-2 weeks   For cholesterol and heart attack prevention -  START Atorvastatin aka Lipitor START Ezetimibe aka Zetia Your PCP or cardiology team should check labs in 6  weeks    Increase activity slowly   Complete by: As directed   [place]:  Chest pain Elevated troponin d/t supply/demand mismatch - MI ruled out  Complicated by atrial fibrillation with RVR, now rate controlled Suspect that chest pain was due to A-fib with RVR and acute on chronic diastolic heart failure. Heparin gtt per cardiology  Rate control w/ metoprolol  Cardiac cath done, see above  Echo done, no concerns, see above   Resume Eliquis on discharge   Hypertension Blood pressure is stable Continue metoprolol for HTN and Afib rate control  Switch HCTZ to Lasix on discharge Can add amlodipine outpatient if needed    Acute diastolic heart failure likely in the setting of atrial fibrillation:  LVEDP was 26 at the time of cardiac catheterization.   witching hydrochlorothiazide to furosemide on hospital discharge.   Hypokalemia - resolved  Repleted Follow AM BMP   OSA - stable  Continue CPAP nightly   Acute kidney injury - resolved Mild elevation of creatinine 1.12 --> improved to 1.01 [place] BMP    Bilateral lower extremity edema Echocardiogram as above no CHF    Vision changes consistent with scotoma/floaters No temporal pain No total vision loss Ddx would include optic nerve problem or blood upply to optic nerve compromise, MS, other neurologic or optic pathology  Follow outpatient Pt did note episode few weeks ago of "grey" vision which resovled on its own  Stroke precautions reviewed Pt declined to stay in the hospital for additional imaging Eye Problem  The left eye is affected. Chronicity: 2 episodes. Pertinent  negatives include no weakness.  She had greying of vision in left eye that then became scotomata and in total lasted 30 minutes.  This was followed by a mild headache that was relieved by Advil.  After her cath this week she had similar symptoms but then went to sleep and woke without a headache or persistent vision changes.  Lab  Results  Component Value Date   NA 137 12/09/2021   K 3.9 12/09/2021   CO2 25 12/09/2021   GLUCOSE 124 (H) 12/09/2021   BUN 12 12/09/2021   CREATININE 0.98 12/09/2021   CALCIUM 9.1 12/09/2021   EGFR 66 07/15/2021   GFRNONAA >60 12/09/2021   Lab Results  Component Value Date   CHOL 271 (H) 07/15/2021   HDL 44 07/15/2021   LDLCALC 203 (H) 07/15/2021   TRIG 133 07/15/2021   CHOLHDL 6.2 (H) 07/15/2021   Lab Results  Component Value Date   TSH 5.447 (H) 12/06/2021   Lab Results  Component Value Date   HGBA1C 6.1 (H) 07/15/2021   Lab Results  Component Value Date   WBC 9.6 12/09/2021   HGB 13.1 12/09/2021   HCT 39.9 12/09/2021   MCV 87.5 12/09/2021   PLT 270 12/09/2021   Lab Results  Component Value Date   ALT 12 07/15/2021   AST 16 07/15/2021   ALKPHOS 83 07/15/2021   BILITOT 0.4 07/15/2021   Lab Results  Component Value Date   VD25OH 20.0 (L) 07/01/2016     Review of Systems  Constitutional:  Negative for chills, fatigue and unexpected weight change.  HENT:  Negative for nosebleeds.   Eyes:  Positive for visual disturbance.  Respiratory:  Negative for cough, chest tightness, shortness of breath and wheezing.   Cardiovascular:  Negative for chest pain, palpitations and leg swelling.  Gastrointestinal:  Negative for abdominal pain, constipation and diarrhea.  Neurological:  Positive for headaches. Negative for dizziness, weakness and light-headedness.  Psychiatric/Behavioral:  Negative for dysphoric mood and sleep disturbance. The patient is not nervous/anxious.     Patient Active Problem List   Diagnosis Date Noted   NSTEMI (non-ST elevated myocardial infarction) ([place]) 12/06/2021   Chest pain 12/05/2021   AKI (acute kidney injury) ([place]) 12/05/2021   Lymphedema of left arm 09/23/2021   Nevus of neck 09/23/2021   Blood in stool 05/13/2021   Peroneal tendinitis, left 09/30/2020   OSA on CPAP 07/12/2020   Acquired thrombophilia ([place]) 07/11/2020   Breast  cyst, right 05/08/2020   Status post hysterectomy 04/03/2020   Colon cancer screening 07/13/2019   Paroxysmal atrial fibrillation with RVR ([place]) 07/11/2019   Multiple thyroid nodules 08/23/2018   Mixed hyperlipidemia 07/06/2018   Microscopic hematuria 05/04/2018   Liver hemangioma 03/26/2017   Panic disorder 06/15/2016   Macroglossia 02/05/2016   Environmental and seasonal allergies 01/02/2016   Hot flashes related to aromatase inhibitor therapy 09/08/2015   Vitamin D deficiency 04/19/2015   Cervical radiculopathy 04/17/2015   Essential hypertension 04/17/2015   History of partial thyroidectomy 04/17/2015   Obesity, Class II, BMI 35-39.9 04/17/2015   Lipoma of axilla 10/24/2014   History of left breast cancer 09/02/2010    Allergies  Allergen Reactions   Ace Inhibitors Swelling   Sernivo [Betamethasone Dipropionate] Itching   Penicillins Rash    Has patient had a PCN reaction causing immediate rash, facial/tongue/throat swelling, SOB or lightheadedness with hypotension: Yes Has patient had a PCN reaction causing severe rash involving mucus membranes or skin necrosis: No Has patient had  a PCN reaction that required hospitalization: No Has patient had a PCN reaction occurring within the last 10 years: No If all of the above answers are "NO", then may proceed with Cephalosporin use.    Past Surgical History:  Procedure Laterality Date   ABDOMINAL HYSTERECTOMY  2011   ovaries remain   birth mark removed     BREAST CYST ASPIRATION Right    negative 01/2010   BREAST EXCISIONAL BIOPSY  08/05/2010   left breast positive 07/2010   BREAST LUMPECTOMY  2012   left breast   COLONOSCOPY  07/29/2012   normal study.   LEFT HEART CATH AND CORONARY ANGIOGRAPHY N/A 12/08/2021   Procedure: LEFT HEART CATH AND CORONARY ANGIOGRAPHY;  Surgeon: Wellington Hampshire, MD;  Location: [place] [facility];  Service: Cardiovascular;  Laterality: N/A;   robotic tonsillectomy Lingual tonsils Bilateral  06/08/2016   THYROID SURGERY     partially removed   TUBAL LIGATION      Social History   Tobacco Use   Smoking status: Never   Smokeless tobacco: Never  Vaping Use   Vaping Use: Never used  Substance Use Topics   Alcohol use: Not Currently    Comment: occ   Drug use: Never     Medication list has been reviewed and updated.  Current Meds  Medication Sig   apixaban (ELIQUIS) 5 MG TABS tablet Take 1 tablet (5 mg total) by mouth 2 (two) times daily.   atorvastatin (LIPITOR) 40 MG tablet Take 1 tablet (40 mg total) by mouth daily.   B Complex-Biotin-FA (B-COMPLEX PO) Take 1 tablet by mouth daily.   ezetimibe (ZETIA) 10 MG tablet Take 1 tablet (10 mg total) by mouth daily.   furosemide (LASIX) 20 MG tablet Take 1 tablet (20 mg total) by mouth daily. Increase to 1 tablet (20 mg total) by mouth TWICE daily (total daily dose 40 mg) as needed for up to 3 days for increased leg swelling, shortness of breath, weight gain 5+ lbs over 1-2 days. Seek medical care if these symptoms are not improving with increased dose.   levothyroxine (SYNTHROID) 75 MCG tablet Take 75 mcg by mouth daily.   LORazepam (ATIVAN) 0.5 MG tablet TAKE 1 TABLET BY MOUTH EVERY 8 HOURS AS NEEDED FOR ANXIETY.   metoprolol succinate (TOPROL-XL) 50 MG 24 hr tablet Take 1.5 tablets (75 mg total) by mouth 2 (two) times daily. Take with or immediately following a meal.   Multiple Vitamin (MULTIVITAMIN) tablet Take 1 tablet by mouth daily.   nitroGLYCERIN (NITROSTAT) 0.4 MG SL tablet Place 1 tablet (0.4 mg total) under the tongue every 5 (five) minutes as needed for chest pain.   potassium chloride SA (KLOR-CON M) 20 MEQ tablet Take 1 tablet (20 mEq total) by mouth daily.   propafenone (RYTHMOL SR) 225 MG 12 hr capsule Take 225 mg by mouth 2 (two) times daily.       12/16/2021    1:59 PM 09/23/2021    2:56 PM 07/15/2021   10:00 AM 01/31/2021    9:48 AM  GAD 7 : Generalized Anxiety Score  Nervous, Anxious, on Edge _0 Control/stop worrying 2 0 0 1  Worry too much - different things _1 Trouble relaxing 0 0 0 2  Restless 0 0 0 2  Easily annoyed or irritable 0 0 0 2  Afraid - awful might happen 1 0 0 1  Total GAD 7 Score 7 2  2 10  Anxiety Difficulty Somewhat difficult Not difficult at all Not difficult at all Not difficult at all       12/16/2021    1:59 PM 09/23/2021    2:56 PM 07/15/2021   10:00 AM  Depression screen PHQ 2/9  Decreased Interest 0 1 0  Down, Depressed, Hopeless 0 0 0  PHQ - 2 Score 0 1 0  Altered sleeping _0 Tired, decreased energy _1 Change in appetite 0 1 1  Feeling bad or failure about yourself  0 0 0  Trouble concentrating 0 2 1  Moving slowly or fidgety/restless 0 1 0  Suicidal thoughts 0 0 0  PHQ-9 Score _2 Difficult doing work/chores Somewhat difficult Somewhat difficult Not difficult at all    BP Readings from Last 3 Encounters:  12/16/21 138/78  12/10/21 122/86  12/09/21 137/68    Physical Exam Vitals and nursing note reviewed.  Constitutional:      General: She is not in acute distress.    Appearance: She is well-developed.  HENT:     Head: Normocephalic and atraumatic.  Cardiovascular:     Rate and Rhythm: Normal rate and regular rhythm.     Pulses: Normal pulses.  Pulmonary:     Effort: Pulmonary effort is normal. No respiratory distress.     Breath sounds: No wheezing or rhonchi.  Musculoskeletal:        General: Normal range of motion.     Cervical back: Normal range of motion.  Skin:    General: Skin is warm and dry.     Capillary Refill: Capillary refill takes less than 2 seconds.     Findings: No rash.  Neurological:     Mental Status: She is alert and oriented to person, place, and time.  Psychiatric:        Mood and Affect: Mood normal.        Behavior: Behavior normal.     Wt Readings from Last 3 Encounters:  12/16/21 270 lb (122.5 kg)  12/10/21 267 lb (121.1 kg)  12/08/21 267 lb (121.1 kg)    BP 138/78 (BP  Location: Right Arm, Patient Position: Sitting, Cuff Size: Normal)   Pulse 71   Ht _3  (1.702 m)   Wt 270 lb (122.5 kg)   LMP 04/01/2008   SpO2 98%   BMI 42.29 kg/m   Assessment and Plan: 1. Coronary artery disease involving native coronary artery of native heart without angina pectoris Stable, normal cath at hospitalization. Troponins elevated due to demand ischemia from rapid Afib Tolerating Atorvastatin and Zetia  2. Paroxysmal atrial fibrillation with RVR ([place]) Planning EF studies and then ablation if appropriate Continue Eliquis  3. Prediabetes Continue work on low carb diet, weight maintenance.  4. Periodic headache syndrome, not intractable Suspect she has had a change in migraine presentation. Monitor for recurrence and consider abortive treatment after cardiac ablation  5. Generalized anxiety disorder Continue PRN Ativan - LORazepam (ATIVAN) 0.5 MG tablet; Take 1 tablet (0.5 mg total) by mouth every 8 (eight) hours as needed. for anxiety  Dispense: 20 tablet; Refill: 0   Partially dictated using Editor, commissioning. Any errors are unintentional.  Halina Maidens, MD [facility]  12/16/2021

## 2021-12-16 NOTE — Telephone Encounter (Signed)
Patient states she was returning call. Please advise  

## 2021-12-17 NOTE — Telephone Encounter (Addendum)
I spoke with the pt and she says that she feels well but she did not expect to return to work until after her 12/26/21 appt... if she does not get a letter keeping her out until then she will be considered as abandoning her job.  I have explained to her that Dr Rockey Situ and Cadence are out of the office this afternoon and I will forward her message to them for review once they return to the office.     Pt had cath 12/08/21 secondary to chest pain.... normal coronaries.... she saw Dr Quentin Ore in the office for Afib 12/10/21.... her next appt with Cadence Furth PA is 12/26/21.   Pt was given a letter for work for the dates 12/05/21- 12/15/21.   I will forward to Cadence for further review and recommendations.

## 2021-12-17 NOTE — Telephone Encounter (Signed)
Pt was unsure who called her... I advised her that a letter had been sent to her My Chart she says she will look at it today and let us know if she needs anything else... she will keep her 12/26/21 office visit.

## 2021-12-17 NOTE — Telephone Encounter (Signed)
Patient states that she needs for the return to work letter to be extended, because she doesn't feel like she can return to work. Please advise

## 2021-12-18 ENCOUNTER — Telehealth: Payer: Self-pay | Admitting: Cardiology

## 2021-12-18 NOTE — Telephone Encounter (Signed)
Patient stated she would like a call back today regarding getting her Return to Work letter extended to 11/3 after her follow-up appointment.

## 2021-12-18 NOTE — Telephone Encounter (Signed)
Spoke with Cadence Kathlen Mody and she advised to extend the patients out of work letter to 12/27/21. Revised letter and called patient to inform her of this. Patient was grateful for the follow up.

## 2021-12-19 ENCOUNTER — Encounter: Payer: Self-pay | Admitting: *Deleted

## 2021-12-19 ENCOUNTER — Telehealth: Payer: Self-pay | Admitting: *Deleted

## 2021-12-19 DIAGNOSIS — R7989 Other specified abnormal findings of blood chemistry: Secondary | ICD-10-CM

## 2021-12-19 MED ORDER — METOPROLOL TARTRATE 25 MG PO TABS: 25.0000 mg | ORAL_TABLET | Freq: Once | ORAL | 0 refills | Status: DC | PRN

## 2021-12-19 NOTE — Telephone Encounter (Signed)
Patient was offered sooner ablation date on Nov 20. Verbalized she would move to [place] 20.

## 2021-12-23 NOTE — Telephone Encounter (Signed)
Patient states she was recently in the hospital and her TSH was elevated. She is requesting to add a TSH panel to have completed prior to 11/20 procedure if at all possible.

## 2021-12-26 ENCOUNTER — Encounter: Payer: Self-pay | Admitting: Medical

## 2021-12-26 ENCOUNTER — Telehealth: Payer: Self-pay | Admitting: Cardiology

## 2021-12-26 ENCOUNTER — Ambulatory Visit: Payer: BC Managed Care – PPO | Attending: Medical | Admitting: Medical

## 2021-12-26 ENCOUNTER — Other Ambulatory Visit
Admission: RE | Admit: 2021-12-26 | Discharge: 2021-12-26 | Disposition: A | Discharge status: Home / Self Care | Payer: BC Managed Care – PPO | Attending: Cardiology | Admitting: Cardiology

## 2021-12-26 VITALS — BP 116/90 | HR 96 | Ht 67.0 in | Wt 270.0 lb

## 2021-12-26 DIAGNOSIS — I5032 Chronic diastolic (congestive) heart failure: Secondary | ICD-10-CM

## 2021-12-26 DIAGNOSIS — I2510 Atherosclerotic heart disease of native coronary artery without angina pectoris: Secondary | ICD-10-CM | POA: Diagnosis not present

## 2021-12-26 DIAGNOSIS — G4733 Obstructive sleep apnea (adult) (pediatric): Secondary | ICD-10-CM

## 2021-12-26 DIAGNOSIS — R946 Abnormal results of thyroid function studies: Secondary | ICD-10-CM | POA: Diagnosis not present

## 2021-12-26 DIAGNOSIS — I10 Essential (primary) hypertension: Secondary | ICD-10-CM

## 2021-12-26 DIAGNOSIS — R7989 Other specified abnormal findings of blood chemistry: Secondary | ICD-10-CM | POA: Insufficient documentation

## 2021-12-26 DIAGNOSIS — I4819 Other persistent atrial fibrillation: Secondary | ICD-10-CM

## 2021-12-26 LAB — TSH: TSH: 1.14 u[IU]/mL (ref 0.350–4.500)

## 2021-12-26 NOTE — Patient Instructions (Addendum)
Medication Instructions:   Your physician recommends that you continue on your current medications as directed. Please refer to the Current Medication list given to you today.  *If you need a refill on your cardiac medications before your next appointment, please call your pharmacy*   Lab Work:  None Ordered  If you have labs (blood work) drawn today and your tests are completely normal, you will receive your results only by: [place] (if you have MyChart) OR A paper copy in the mail If you have any lab test that is abnormal or we need to change your treatment, we will call you to review the results.   Testing/Procedures:  None Ordered   Follow-Up: At [facility], you and your health needs are our priority.  As part of our continuing mission to provide you with exceptional heart care, we have created designated Provider Care Teams.  These Care Teams include your primary Cardiologist (physician) and Advanced Practice Providers (APPs -  Physician Assistants and Nurse Practitioners) who all work together to provide you with the care you need, when you need it.  We recommend signing up for the patient portal called "MyChart".  Sign up information is provided on this After Visit Summary.  MyChart is used to connect with patients for Virtual Visits (Telemedicine).  Patients are able to view lab/test results, encounter notes, upcoming appointments, etc.  Non-urgent messages can be sent to your provider as well.   To learn more about what you can do with MyChart, go to [email or web address].    Your next appointment:   3 month(s)  The format for your next appointment:   In Person  Provider:   You may see Ida Rogue, MD or one of the following Advanced Practice Providers on your designated Care Team:   Murray Hodgkins, NP Christell Faith, PA-C Cadence Kathlen Mody, PA-C Gerrie Nordmann, NP  Other Instructions  Your CT has been moved up to  - 01/08/2022 @ 9:30am

## 2021-12-26 NOTE — Telephone Encounter (Signed)
[place] lab calling because pt states she is supposed to get additonal blood drawn but they dont see any additional orders. Please advise

## 2021-12-26 NOTE — Progress Notes (Unsigned)
Cardiology Office Note:    Date:  12/28/2021   ID:  [name], DOB December 16, 1960, MRN [id number]  PCP:  Glean Hess, MD  [facility] Cardiologist:  Ida Rogue, MD  [facility] Electrophysiologist:  Vickie Epley, MD   Referring MD: Glean Hess, MD   Chief Complaint: Hospital follow-up/disability paperwork  History of Present Illness:    Heather Dudley is a 61 y.o. female with a hx of A-fib who is being seen for hospital follow-up.  Patiently was admitted mid October 2023 for chest pain.  Troponin went up to 383. She was in rate controlled A-fib.  Eliquis was stopped and IV heparin was started.  Heart cath showed nonobstructive CAD.  Patient converted to normal sinus rhythm during admission.  Patient was previously on Multaq, however she could not afford this.  She is rate controlled with Toprol.  She was referred to EP to discuss antiarrhythmic.  She saw EP 12/10/2021 and her metoprolol was increased to 75 mg twice daily.  Plan is to pursue A-fib ablation.  Today,the patient is overall doing well. Afib ablation was going to be Dec 28th, but now it's going to be Nov 20th. The patient denies chest pain. She has occasional shortness of breath. She is in aflutter with rates in the 90s. She has occasional heat racing. She takes lasix 82m daily. No lower leg edema.  She is needing her CT scan moved up.  Past Medical History:  Diagnosis Date   Basal cell carcinoma of forehead    birthmark of forehead   Breast cancer (HCC)    Breast screening, unspecified    Cellulitis and abscess of trunk    Diastolic dysfunction    a. 05/2019 Echo: EF 55-60%, no rwma, mild LVH, Gr2 DD, Nl RV size/fxn. Triv MR.   Hernia    History of chemotherapy 2012   Hypertension    Lump or mass in breast    Malignant neoplasm of breast (female), unspecified site    She underwent wide excision, mastoplasty and axillary dissection for her left breast malignancy on September 03, 2010.  The primary tumor was 1 cm in diameter, and a single positive axillary node 1.3 cm in diameter. This was an ER-positive, PR slightly positive, HER-2/neu not overexpressing tumor. She tolerated her whole breast radiation without difficulty ending in late February 2013.   Malignant neoplasm of upper-outer quadrant of female breast ([place]    Neoplasm of uncertain behavior of connective and other soft tissue    Obesity, unspecified    PAF (paroxysmal atrial fibrillation) ([place] 05/04/2017   a. 04/2019 Zio: RSR, avg HR 79. 2% PAF burden (73-175, avg 115; longest 570ms 28secs). CHA2DS2VASc = 2-->Xarelto.   Personal history of chemotherapy    Personal history of malignant neoplasm of breast    The patient underwent wide excision, mastoplasty. Dissection for a T1b, N1, M0 carcinoma left breast on September 03, 2010.The primary tumor was 1 cm in diameter, and a single positive axillary node 1.3 cm in diameter. This was an ER-positive, PR slightly positive, HER-2/neu not overexpressing tumor.  The patient completed whole breast radiation in February 2013.   Personal history of radiation therapy 2013   LEFT lumpectomy   Screening for obesity    Thyroid nodule     Past Surgical History:  Procedure Laterality Date   ABDOMINAL HYSTERECTOMY  2011   ovaries remain   birth mark removed     BREAST CYST ASPIRATION Right  negative 01/2010   BREAST EXCISIONAL BIOPSY  08/05/2010   left breast positive 07/2010   BREAST LUMPECTOMY  2012   left breast   COLONOSCOPY  07/29/2012   normal study.   LEFT HEART CATH AND CORONARY ANGIOGRAPHY N/A 12/08/2021   Procedure: LEFT HEART CATH AND CORONARY ANGIOGRAPHY;  Surgeon: Wellington Hampshire, MD;  Location: [place] [facility];  Service: Cardiovascular;  Laterality: N/A;   robotic tonsillectomy Lingual tonsils Bilateral 06/08/2016   THYROID SURGERY     partially removed   TUBAL LIGATION      Current Medications: Current Meds  Medication Sig   apixaban (ELIQUIS) 5 MG  TABS tablet Take 1 tablet (5 mg total) by mouth 2 (two) times daily.   atorvastatin (LIPITOR) 40 MG tablet Take 1 tablet (40 mg total) by mouth daily.   B Complex-Biotin-FA (B-COMPLEX PO) Take 1 tablet by mouth daily.   ezetimibe (ZETIA) 10 MG tablet Take 1 tablet (10 mg total) by mouth daily.   furosemide (LASIX) 20 MG tablet Take 1 tablet (20 mg total) by mouth daily. Increase to 1 tablet (20 mg total) by mouth TWICE daily (total daily dose 40 mg) as needed for up to 3 days for increased leg swelling, shortness of breath, weight gain 5+ lbs over 1-2 days. Seek medical care if these symptoms are not improving with increased dose.   levothyroxine (SYNTHROID) 75 MCG tablet Take 75 mcg by mouth daily.   LORazepam (ATIVAN) 0.5 MG tablet Take 1 tablet (0.5 mg total) by mouth every 8 (eight) hours as needed. for anxiety   metoprolol succinate (TOPROL-XL) 50 MG 24 hr tablet Take 1.5 tablets (75 mg total) by mouth 2 (two) times daily. Take with or immediately following a meal.   Multiple Vitamin (MULTIVITAMIN) tablet Take 1 tablet by mouth daily.   nitroGLYCERIN (NITROSTAT) 0.4 MG SL tablet Place 1 tablet (0.4 mg total) under the tongue every 5 (five) minutes as needed for chest pain.   potassium chloride SA (KLOR-CON M) 20 MEQ tablet Take 1 tablet (20 mEq total) by mouth daily.   propafenone (RYTHMOL SR) 225 MG 12 hr capsule Take 225 mg by mouth 2 (two) times daily.     Allergies:   Ace inhibitors, Sernivo [betamethasone dipropionate], and Penicillins   Social History   Socioeconomic History   Marital status: Married    Spouse name: Heather Dudley   Number of children: 3   Years of education: 16   Highest education level: Bachelor's degree (e.g., BA, AB, BS)  Occupational History   Not on file  Tobacco Use   Smoking status: Never   Smokeless tobacco: Never  Vaping Use   Vaping Use: Never used  Substance and Sexual Activity   Alcohol use: Not Currently    Comment: occ   Drug use: Never    Sexual activity: Yes    Partners: Male    Birth control/protection: Surgical  Other Topics Concern   Not on file  Social History Narrative   Not on file   Social Determinants of Health   Financial Resource Strain: Low Risk  (07/15/2021)   Overall Financial Resource Strain (CARDIA)    Difficulty of Paying Living Expenses: Not hard at all  Food Insecurity: No Food Insecurity (12/06/2021)   Hunger Vital Sign    Worried About Running Out of Food in the Last Year: Never true    Ran Out of Food in the Last Year: Never true  Transportation Needs: No Transportation Needs (12/06/2021)  PRAPARE - Hydrologist (Medical): No    Lack of Transportation (Non-Medical): No  Physical Activity: Not on file  Stress: Not on file  Social Connections: Not on file     Family History: The patient's family history includes Cancer in her cousin, maternal uncle, mother, and paternal aunt; Colon cancer in an other family member; Heart failure in her father; Ovarian cancer in an other family member. There is no history of Breast cancer.  ROS:   Please see the history of present illness.     All other systems reviewed and are negative.  EKGs/Labs/Other Studies Reviewed:    The following studies were reviewed today:  LHC 12/08/21     The left ventricular systolic function is normal.   LV end diastolic pressure is moderately elevated.   The left ventricular ejection fraction is 55-65% by visual estimate.   1.  Near normal coronary arteries with no significant coronary artery disease. 2.  Normal LV systolic function and moderately elevated left ventricular end-diastolic pressure at 26 mmHg.   Recommendations: No culprit is identified for elevated troponin. Elevated troponin is likely due to supply demand ischemia possibly in the setting of underlying arrhythmia and diastolic heart failure. Recommend continuing medical therapy Resume Eliquis tomorrow. Possible discharge  home tomorrow with outpatient monitor.   Echo 06/13/19 1. Left ventricular ejection fraction, by estimation, is 55 to 60%. The  left ventricle has normal function. The left ventricle has no regional  wall motion abnormalities. There is mild left ventricular hypertrophy.  Left ventricular diastolic parameters  are consistent with Grade II diastolic dysfunction (pseudonormalization).  Elevated left atrial pressure.   2. Right ventricular systolic function is normal. The right ventricular  size is normal. There is moderately elevated pulmonary artery systolic  pressure.   3. The mitral valve is normal in structure. Trivial mitral valve  regurgitation. No evidence of mitral stenosis.   4. The aortic valve was not well visualized. Aortic valve regurgitation  is not visualized. No aortic stenosis is present.   5. Pulmonic valve regurgitation not well assessed.   6. The inferior vena cava is dilated in size with <50% respiratory  variability, suggesting right atrial pressure of 15 mmHg.     EKG:  EKG is ordered today.  The ekg ordered today demonstrates Aflutter 96bpm, LAD, no changes  Recent Labs: 07/15/2021: ALT 12 12/06/2021: Magnesium 1.9 12/26/2021: TSH 1.140 12/27/2021: BUN 20; Creatinine, Ser 1.19; Hemoglobin 13.4; Platelets 308; Potassium 4.3; Sodium 135  Recent Lipid Panel    Component Value Date/Time   CHOL 271 (H) 07/15/2021 1039   TRIG 133 07/15/2021 1039   HDL 44 07/15/2021 1039   CHOLHDL 6.2 (H) 07/15/2021 1039   LDLCALC 203 (H) 07/15/2021 1039    Physical Exam:    VS:  BP (!) 116/90 (BP Location: Right Arm, Patient Position: Sitting, Cuff Size: Large)   Pulse 96   Ht 5' 7" (1.702 m)   Wt 270 lb (122.5 kg)   LMP 04/01/2008   SpO2 98%   BMI 42.29 kg/m     Wt Readings from Last 3 Encounters:  12/27/21 270 lb (122.5 kg)  12/26/21 270 lb (122.5 kg)  12/16/21 270 lb (122.5 kg)     GEN:  Well nourished, well developed in no acute distress HEENT: Normal NECK: No  JVD; No carotid bruits LYMPHATICS: No lymphadenopathy CARDIAC: Reg IRreg, no murmurs, rubs, gallops RESPIRATORY:  Clear to auscultation without rales, wheezing or rhonchi  ABDOMEN: Soft, non-tender, non-distended MUSCULOSKELETAL:  No edema; No deformity  SKIN: Warm and dry NEUROLOGIC:  Alert and oriented x 3 PSYCHIATRIC:  Normal affect   ASSESSMENT:    1. Coronary artery disease involving native coronary artery of native heart without angina pectoris   2. Persistent atrial fibrillation ([place])   3. Chronic diastolic heart failure ([place])   4. OSA on CPAP   5. Essential hypertension    PLAN:    In order of problems listed above:  Elevated troponin Nonosbtructive CAD Recent left heart cath showed nonobstructive CAD.  Patient denies further anginal symptoms. Cath site healed well. No aspirin given Eliquis. Continue Lipitor and Toprol. No further ischemic work-up indicated at this time.  Persistent Afib Plan for atrial fibrillation ablation, the procedures was recently moved from December 28 to November 20.  Patient had preprocedural blood work today.  We will try and move up the Cardiac CT.  Continue Eliquis 5 mg twice daily. She is following with EP.   Diastolic dysfunction Patient is euvolemic on exam. Continue lasix 46m daily, Toprol and potassium.   OSA She is using CPAP machine.   HTN Blood pressure is good today, continue current medications.  Disposition: Follow up in 3 month(s) with MD/APP     Signed, Cadence HNinfa Meeker PA-C  12/28/2021 9:02 PM    [facility]

## 2021-12-27 ENCOUNTER — Emergency Department: Payer: BC Managed Care – PPO

## 2021-12-27 ENCOUNTER — Emergency Department
Admission: EM | Admit: 2021-12-27 | Discharge: 2021-12-27 | Disposition: A | Discharge status: Home / Self Care | Payer: BC Managed Care – PPO | Attending: Emergency Medicine | Admitting: Emergency Medicine

## 2021-12-27 ENCOUNTER — Other Ambulatory Visit: Payer: Self-pay

## 2021-12-27 DIAGNOSIS — R0789 Other chest pain: Secondary | ICD-10-CM | POA: Diagnosis not present

## 2021-12-27 DIAGNOSIS — I10 Essential (primary) hypertension: Secondary | ICD-10-CM | POA: Insufficient documentation

## 2021-12-27 DIAGNOSIS — R0602 Shortness of breath: Secondary | ICD-10-CM | POA: Diagnosis not present

## 2021-12-27 DIAGNOSIS — R778 Other specified abnormalities of plasma proteins: Secondary | ICD-10-CM | POA: Insufficient documentation

## 2021-12-27 DIAGNOSIS — R079 Chest pain, unspecified: Secondary | ICD-10-CM | POA: Diagnosis not present

## 2021-12-27 LAB — BASIC METABOLIC PANEL
Anion gap: 6 (ref 5–15)
BUN: 20 mg/dL (ref 6–20)
CO2: 25 mmol/L (ref 22–32)
Calcium: 8.9 mg/dL (ref 8.9–10.3)
Chloride: 104 mmol/L (ref 98–111)
Creatinine, Ser: 1.19 mg/dL — ABNORMAL HIGH (ref 0.44–1.00)
GFR, Estimated: 53 mL/min — ABNORMAL LOW (ref >60)
Glucose, Bld: 113 mg/dL — ABNORMAL HIGH (ref 70–99)
Potassium: 4.3 mmol/L (ref 3.5–5.1)
Sodium: 135 mmol/L (ref 135–145)

## 2021-12-27 LAB — CBC
HCT: 41 % (ref 36.0–46.0)
Hemoglobin: 13.4 g/dL (ref 12.0–15.0)
MCH: 28.6 pg (ref 26.0–34.0)
MCHC: 32.7 g/dL (ref 30.0–36.0)
MCV: 87.4 fL (ref 80.0–100.0)
Platelets: 308 10*3/uL (ref 150–400)
RBC: 4.69 MIL/uL (ref 3.87–5.11)
RDW: 13.4 % (ref 11.5–15.5)
WBC: 11.1 10*3/uL — ABNORMAL HIGH (ref 4.0–10.5)
nRBC: 0 % (ref 0.0–0.2)

## 2021-12-27 LAB — PROTIME-INR
INR: 1 (ref 0.8–1.2)
Prothrombin Time: 13.5 seconds (ref 11.4–15.2)

## 2021-12-27 LAB — TROPONIN I (HIGH SENSITIVITY)
Troponin I (High Sensitivity): 5 ng/L (ref <18)
Troponin I (High Sensitivity): 4 ng/L (ref <18)

## 2021-12-27 NOTE — ED Notes (Signed)
EDP at bedside  

## 2021-12-27 NOTE — ED Notes (Signed)
See triage note. Pt reports had MI a couple weeks ago. Wears CPAP at night and woke up with shob early this morning, states similar sx with MI. Denies CP. +nausea.  NAD noted. Reports SHOB has eased some. RR even and unlabored. Speaking in complete sentences.

## 2021-12-27 NOTE — ED Provider Notes (Signed)
-----------------------------------------   7:56 AM on 12/27/2021 -----------------------------------------  Blood pressure 131/80, pulse 61, temperature 97.8 F (36.6 C), temperature source Oral, resp. rate 20, height '5\' 7"'$  (1.702 m), weight 122.5 kg, last menstrual period 04/01/2008, SpO2 96 %.  Assuming care from Dr. Archie Balboa.  In short, Heather Dudley is a 61 y.o. female with a chief complaint of Hypertension (Woke up with SHOB/ elevated BP. Had MI x 3 weeks ago, treat at [facility]. States she feel "weird on R side similar to last MI) .  Refer to the original H&P for additional details.  The current plan of care is to follow-up repeat troponin.  ----------------------------------------- 8:15 AM on 12/27/2021 ----------------------------------------- Repeat troponin within normal limits and patient remains asymptomatic at this time with reassuring blood pressure.  She is appropriate for discharge home with PCP or cardiology follow-up, was counseled to return to the ED for new or worsening symptoms.  Patient and spouse agree with plan.    Blake Divine, MD 12/27/21 [phone]

## 2021-12-27 NOTE — ED Provider Notes (Signed)
[facility] Provider Note    Event Date/Time   First MD Initiated Contact with Patient 12/27/21 [phone]     (approximate)   History   Shortness of breath   HPI  Heather Dudley is a 61 y.o. female who presents to the emergency department today because of concerns for shortness of breath and high blood pressure.  The patient woke up this morning and noticed that she was short of breath.  This was accompanied by some right-sided chest discomfort.  At that time she checked her blood pressure and noted it to be high.  She then waited and checked it again and it was elevating.  Per discharge summary dated 12/09/21 she was admitted to the hospital last month for NSTEMI and had a cardiac cath done which did not show obstructive disease and symptoms were thought to be do to afib.  Patient states at the time of my exam symptoms are improving.       Physical Exam   Triage Vital Signs: ED Triage Vitals  Enc Vitals Group     BP 12/27/21 0557 133/77     Pulse Rate 12/27/21 0601 67     Resp 12/27/21 0557 20     Temp 12/27/21 0559 97.8 F (36.6 C)     Temp Source 12/27/21 0557 Oral     SpO2 12/27/21 0559 98 %     Weight 12/27/21 0558 270 lb (122.5 kg)     Height 12/27/21 0558 '5\' 7"'$  (1.702 m)     Head Circumference --      Peak Flow --      Pain Score 12/27/21 0556 0     Pain Loc --      Pain Edu? --      Excl. in [place]? --     Most recent vital signs: Vitals:   12/27/21 0559 12/27/21 0601  BP:    Pulse:  67  Resp:    Temp: 97.8 F (36.6 C)   SpO2: 98%    General: Awake, alert, oriented. CV:  Good peripheral perfusion. Regular rate and rhythm Resp:  Normal effort. Lungs clear Abd:  No distention.  Other:  No lower extremity edema.    ED Results / Procedures / Treatments   Labs (all labs ordered are listed, but only abnormal results are displayed) Labs Reviewed  BASIC METABOLIC PANEL - Abnormal; Notable for the following components:      Result  Value   Glucose, Bld 113 (*)    Creatinine, Ser 1.19 (*)    GFR, Estimated 53 (*)    All other components within normal limits  CBC - Abnormal; Notable for the following components:   WBC 11.1 (*)    All other components within normal limits  PROTIME-INR  POC URINE PREG, ED  TROPONIN I (HIGH SENSITIVITY)     EKG  I, Nance Pear, attending physician, personally viewed and interpreted this EKG  EKG Time: 0559 Rate: 67 Rhythm: normal sinus rhythm Axis: left axis deviation Intervals: qtc 416 QRS: narrow, q waves III, aVF ST changes: no st elevation Impression: abnormal ekg   RADIOLOGY I independently interpreted and visualized the CXR. My interpretation: No pneumonia Radiology interpretation:  IMPRESSION:  No active cardiopulmonary disease. Stable chest with slight  cardiomegaly.      PROCEDURES:  Critical Care performed: No  Procedures   MEDICATIONS ORDERED IN ED: Medications - No data to display   IMPRESSION / MDM / ASSESSMENT AND PLAN /  ED COURSE  I reviewed the triage vital signs and the nursing notes.                              Differential diagnosis includes, but is not limited to, ACS, arrythmia, CHF, pneumonia.  Patient's presentation is most consistent with acute presentation with potential threat to life or bodily function.  The patient is on the cardiac monitor to evaluate for evidence of arrhythmia and/or significant heart rate changes.  Patient presented to the emergency department today because of concerns for shortness of breath and high blood pressure.  At the time my exam she states that she is feeling better.  Had recent admission for elevated troponin with negative catheterization.  EKG here without any ST elevation.  Chest x-ray without pneumonia, pneumothorax.  Initial troponin was normal.  We will plan on checking second troponin.  If negative and patient continues to feel improvement I think would be reasonable for patient be  discharged to follow-up with cardiology.   FINAL CLINICAL IMPRESSION(S) / ED DIAGNOSES   Final diagnoses:  Shortness of breath     Note:  This document was prepared using Dragon voice recognition software and may include unintentional dictation errors.    Nance Pear, MD 12/27/21 [phone]

## 2021-12-27 NOTE — Discharge Instructions (Signed)
Please seek medical attention for any high fevers, chest pain, shortness of breath, change in behavior, persistent vomiting, bloody stool or any other new or concerning symptoms.  

## 2021-12-27 NOTE — ED Notes (Signed)
Signature pad not working. Pt verbalizes understanding of d/c instructions. Denies questions or concerns. NAD noted.

## 2021-12-29 ENCOUNTER — Telehealth: Payer: Self-pay | Admitting: Medical

## 2021-12-29 ENCOUNTER — Telehealth: Payer: Self-pay | Admitting: Cardiovascular Disease

## 2021-12-29 DIAGNOSIS — Z0279 Encounter for issue of other medical certificate: Secondary | ICD-10-CM

## 2021-12-29 NOTE — Telephone Encounter (Signed)
Returned the call to the patient. She was returning a call she received about paying $29 for disability forms to be filled out. She has been advised that she will need to fill out some forms as well. She will come to the office.

## 2021-12-29 NOTE — Telephone Encounter (Signed)
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 Will forward this to triage as I am uncertain what the forms are for and Dr. Rockey Situ & Cadence do not have assigned nurses at this time.   She has a pending a-fib ablation on 01/12/22.

## 2021-12-29 NOTE — Telephone Encounter (Signed)
Forms received from [place] on 12/29/21.completed patient authorization for attached.Placed in [employer]. Faxed to billing

## 2021-12-29 NOTE — Telephone Encounter (Signed)
Pt would like a callback from nurse regarding Disability paperwork. Please advise

## 2021-12-30 ENCOUNTER — Telehealth: Payer: Self-pay

## 2021-12-30 NOTE — Telephone Encounter (Signed)
Paperwork pulled for Cadence to review- placed on her desk.

## 2021-12-30 NOTE — Telephone Encounter (Signed)
Transition Care Management Follow-up Telephone Call Date of discharge and from where: 12/27/2021 from [facility] How have you been since you were released from the hospital? Patient says she is feeling alright. Any questions or concerns? No  Items Reviewed: Did the pt receive and understand the discharge instructions provided? Yes  Medications obtained and verified? Yes  Other? No  Any new allergies since your discharge? No  Dietary orders reviewed? Yes Do you have support at home? Yes   Home Care and Equipment/Supplies: Were home health services ordered? no   Functional Questionnaire: (I = Independent and D = Dependent) ADLs: I  Bathing/Dressing- I  Meal Prep- I  Eating- I  Maintaining continence- I  Transferring/Ambulation- I  Managing Meds- I  Follow up appointments reviewed:  PCP Hospital f/u appt confirmed? No   Specialist Hospital f/u appt confirmed? Yes  Scheduled to see Cardiology on 01/12/2022 for AFIB Ablation. Are transportation arrangements needed? No  If their condition worsens, is the pt aware to call PCP or go to the Emergency Dept.? Yes Was the patient provided with contact information for the PCP's office or ED? Yes Was to pt encouraged to call back with questions or concerns? Yes

## 2021-12-31 DIAGNOSIS — I10 Essential (primary) hypertension: Secondary | ICD-10-CM | POA: Diagnosis not present

## 2021-12-31 DIAGNOSIS — E041 Nontoxic single thyroid nodule: Secondary | ICD-10-CM | POA: Diagnosis not present

## 2021-12-31 DIAGNOSIS — G4733 Obstructive sleep apnea (adult) (pediatric): Secondary | ICD-10-CM | POA: Diagnosis not present

## 2021-12-31 NOTE — Telephone Encounter (Signed)
Cadence brought me the patient's completed FMLA forms. I left these forms on Sabrina's desk at checkout for processing.

## 2021-12-31 NOTE — Telephone Encounter (Signed)
Antony Madura, PA-C  Sent: Wed December 31, 2021 11:05 AM  To: Emily Filbert, RN         Message  I saw, it can fill it out later today

## 2022-01-01 ENCOUNTER — Other Ambulatory Visit (HOSPITAL_BASED_OUTPATIENT_CLINIC_OR_DEPARTMENT_OTHER): Payer: Self-pay | Admitting: Osteopathic Medicine

## 2022-01-01 NOTE — Telephone Encounter (Signed)
Forms completed and faxed to [facility], patient also informed.

## 2022-01-07 ENCOUNTER — Telehealth (HOSPITAL_COMMUNITY): Payer: Self-pay | Admitting: *Deleted

## 2022-01-07 NOTE — Telephone Encounter (Signed)
Attempted to call patient regarding upcoming cardiac CT appointment. °Left message on voicemail with name and callback number ° °Zendayah Hardgrave RN Navigator Cardiac Imaging °[facility] °[phone] Office °[phone] Cell ° °

## 2022-01-08 ENCOUNTER — Ambulatory Visit
Admission: RE | Admit: 2022-01-08 | Discharge: 2022-01-08 | Disposition: A | Discharge status: Home / Self Care | Payer: BC Managed Care – PPO | Source: Ambulatory Visit | Attending: Cardiology | Admitting: Cardiology

## 2022-01-08 DIAGNOSIS — G4733 Obstructive sleep apnea (adult) (pediatric): Secondary | ICD-10-CM | POA: Insufficient documentation

## 2022-01-08 DIAGNOSIS — I4819 Other persistent atrial fibrillation: Secondary | ICD-10-CM | POA: Diagnosis not present

## 2022-01-08 DIAGNOSIS — Z01818 Encounter for other preprocedural examination: Secondary | ICD-10-CM | POA: Insufficient documentation

## 2022-01-08 DIAGNOSIS — I10 Essential (primary) hypertension: Secondary | ICD-10-CM | POA: Insufficient documentation

## 2022-01-08 MED ORDER — IOHEXOL 350 MG/ML SOLN
100.0000 mL | Freq: Once | INTRAVENOUS | Status: AC | PRN
  Administered 2022-01-08: 100 mL via INTRAVENOUS

## 2022-01-09 NOTE — Pre-Procedure Instructions (Signed)
Instructed patient on the following items: Arrival time 0530 Nothing to eat or drink after midnight No meds AM of procedure Responsible person to drive you home and stay with you for 24 hrs  Have you missed any doses of anti-coagulant Eliquis- hasn't missed any doses    

## 2022-01-12 ENCOUNTER — Other Ambulatory Visit: Payer: Self-pay

## 2022-01-12 ENCOUNTER — Other Ambulatory Visit (HOSPITAL_COMMUNITY): Payer: Self-pay

## 2022-01-12 ENCOUNTER — Ambulatory Visit (HOSPITAL_COMMUNITY): Payer: BC Managed Care – PPO | Admitting: Anesthesiology

## 2022-01-12 ENCOUNTER — Encounter (HOSPITAL_COMMUNITY)
Admission: RE | Disposition: A | Discharge status: Home / Self Care | Payer: BC Managed Care – PPO | Source: Home / Self Care | Attending: Cardiology

## 2022-01-12 ENCOUNTER — Ambulatory Visit (HOSPITAL_COMMUNITY)
Admission: RE | Admit: 2022-01-12 | Discharge: 2022-01-12 | Disposition: A | Discharge status: Home / Self Care | Payer: BC Managed Care – PPO | Attending: Cardiology | Admitting: Cardiology

## 2022-01-12 DIAGNOSIS — I483 Typical atrial flutter: Secondary | ICD-10-CM | POA: Diagnosis not present

## 2022-01-12 DIAGNOSIS — I10 Essential (primary) hypertension: Secondary | ICD-10-CM | POA: Diagnosis not present

## 2022-01-12 DIAGNOSIS — Z7901 Long term (current) use of anticoagulants: Secondary | ICD-10-CM | POA: Diagnosis not present

## 2022-01-12 DIAGNOSIS — I4819 Other persistent atrial fibrillation: Secondary | ICD-10-CM | POA: Diagnosis not present

## 2022-01-12 DIAGNOSIS — I4891 Unspecified atrial fibrillation: Secondary | ICD-10-CM | POA: Diagnosis not present

## 2022-01-12 DIAGNOSIS — Z79899 Other long term (current) drug therapy: Secondary | ICD-10-CM | POA: Insufficient documentation

## 2022-01-12 DIAGNOSIS — G4730 Sleep apnea, unspecified: Secondary | ICD-10-CM | POA: Diagnosis not present

## 2022-01-12 DIAGNOSIS — Z853 Personal history of malignant neoplasm of breast: Secondary | ICD-10-CM | POA: Diagnosis not present

## 2022-01-12 DIAGNOSIS — G4733 Obstructive sleep apnea (adult) (pediatric): Secondary | ICD-10-CM | POA: Insufficient documentation

## 2022-01-12 DIAGNOSIS — F419 Anxiety disorder, unspecified: Secondary | ICD-10-CM | POA: Diagnosis not present

## 2022-01-12 HISTORY — PX: ATRIAL FIBRILLATION ABLATION: EP1191

## 2022-01-12 LAB — POCT ACTIVATED CLOTTING TIME
Activated Clotting Time: 323 seconds
Activated Clotting Time: 335 seconds

## 2022-01-12 SURGERY — ATRIAL FIBRILLATION ABLATION
Anesthesia: General

## 2022-01-12 MED ORDER — SUGAMMADEX SODIUM 200 MG/2ML IV SOLN
INTRAVENOUS | Status: DC | PRN
  Administered 2022-01-12: 200 mg via INTRAVENOUS

## 2022-01-12 MED ORDER — ONDANSETRON HCL 4 MG/2ML IJ SOLN: 4.0000 mg | Freq: Four times a day (QID) | INTRAMUSCULAR | Status: DC | PRN

## 2022-01-12 MED ORDER — SODIUM CHLORIDE 0.9 % IV SOLN: INTRAVENOUS | Status: DC

## 2022-01-12 MED ORDER — HEPARIN SODIUM (PORCINE) 1000 UNIT/ML IJ SOLN
INTRAMUSCULAR | Status: DC | PRN
  Administered 2022-01-12: 1000 [IU] via INTRAVENOUS

## 2022-01-12 MED ORDER — HEPARIN SODIUM (PORCINE) 1000 UNIT/ML IJ SOLN
INTRAMUSCULAR | Status: DC | PRN
  Administered 2022-01-12: 18000 [IU] via INTRAVENOUS
  Administered 2022-01-12: 3000 [IU] via INTRAVENOUS

## 2022-01-12 MED ORDER — HEPARIN SODIUM (PORCINE) 1000 UNIT/ML IJ SOLN
INTRAMUSCULAR | Status: AC
  Filled 2022-01-12: qty 10

## 2022-01-12 MED ORDER — HEPARIN (PORCINE) IN NACL 1000-0.9 UT/500ML-% IV SOLN
INTRAVENOUS | Status: DC | PRN
  Administered 2022-01-12 (×3): 500 mL

## 2022-01-12 MED ORDER — ONDANSETRON HCL 4 MG/2ML IJ SOLN
INTRAMUSCULAR | Status: DC | PRN
  Administered 2022-01-12: 4 mg via INTRAVENOUS

## 2022-01-12 MED ORDER — ACETAMINOPHEN 325 MG PO TABS: 650.0000 mg | ORAL_TABLET | ORAL | Status: DC | PRN

## 2022-01-12 MED ORDER — COLCHICINE 0.6 MG PO TABS
0.6000 mg | ORAL_TABLET | Freq: Two times a day (BID) | ORAL | Status: DC
  Administered 2022-01-12: 0.6 mg via ORAL
  Filled 2022-01-12: qty 1

## 2022-01-12 MED ORDER — PROTAMINE SULFATE 10 MG/ML IV SOLN
INTRAVENOUS | Status: DC | PRN
  Administered 2022-01-12: 30 mg via INTRAVENOUS

## 2022-01-12 MED ORDER — PROPAFENONE HCL ER 225 MG PO CP12
225.0000 mg | ORAL_CAPSULE | Freq: Two times a day (BID) | ORAL | Status: DC
  Administered 2022-01-12: 225 mg via ORAL
  Filled 2022-01-12: qty 1

## 2022-01-12 MED ORDER — SODIUM CHLORIDE 0.9% FLUSH: 3.0000 mL | Freq: Two times a day (BID) | INTRAVENOUS | Status: DC

## 2022-01-12 MED ORDER — SODIUM CHLORIDE 0.9 % IV SOLN: 250.0000 mL | INTRAVENOUS | Status: DC | PRN

## 2022-01-12 MED ORDER — COLCHICINE 0.6 MG PO TABS
0.6000 mg | ORAL_TABLET | Freq: Two times a day (BID) | ORAL | 0 refills | Status: DC
  Filled 2022-01-12: qty 10, 5d supply, fill #0

## 2022-01-12 MED ORDER — FENTANYL CITRATE (PF) 100 MCG/2ML IJ SOLN
INTRAMUSCULAR | Status: DC | PRN
  Administered 2022-01-12 (×2): 25 ug via INTRAVENOUS

## 2022-01-12 MED ORDER — SODIUM CHLORIDE 0.9% FLUSH: 3.0000 mL | INTRAVENOUS | Status: DC | PRN

## 2022-01-12 MED ORDER — PROPOFOL 10 MG/ML IV BOLUS
INTRAVENOUS | Status: DC | PRN
  Administered 2022-01-12: 200 mg via INTRAVENOUS

## 2022-01-12 MED ORDER — MIDAZOLAM HCL 2 MG/2ML IJ SOLN
INTRAMUSCULAR | Status: DC | PRN
  Administered 2022-01-12: 2 mg via INTRAVENOUS

## 2022-01-12 MED ORDER — HEPARIN (PORCINE) IN NACL 1000-0.9 UT/500ML-% IV SOLN
INTRAVENOUS | Status: AC
  Filled 2022-01-12: qty 1500

## 2022-01-12 MED ORDER — LIDOCAINE 2% (20 MG/ML) 5 ML SYRINGE
INTRAMUSCULAR | Status: DC | PRN
  Administered 2022-01-12: 60 mg via INTRAVENOUS

## 2022-01-12 MED ORDER — FUROSEMIDE 20 MG PO TABS
20.0000 mg | ORAL_TABLET | Freq: Every day | ORAL | Status: DC
  Administered 2022-01-12: 20 mg via ORAL
  Filled 2022-01-12: qty 1

## 2022-01-12 MED ORDER — APIXABAN 5 MG PO TABS: 5.0000 mg | ORAL_TABLET | Freq: Two times a day (BID) | ORAL | Status: DC

## 2022-01-12 MED ORDER — ROCURONIUM BROMIDE 10 MG/ML (PF) SYRINGE
PREFILLED_SYRINGE | INTRAVENOUS | Status: DC | PRN
  Administered 2022-01-12: 60 mg via INTRAVENOUS
  Administered 2022-01-12: 20 mg via INTRAVENOUS

## 2022-01-12 MED ORDER — PANTOPRAZOLE SODIUM 40 MG PO TBEC
40.0000 mg | DELAYED_RELEASE_TABLET | Freq: Every day | ORAL | Status: DC
  Administered 2022-01-12: 40 mg via ORAL
  Filled 2022-01-12: qty 1

## 2022-01-12 MED ORDER — PHENYLEPHRINE HCL-NACL 20-0.9 MG/250ML-% IV SOLN
INTRAVENOUS | Status: DC | PRN
  Administered 2022-01-12: 25 ug/min via INTRAVENOUS

## 2022-01-12 MED ORDER — PANTOPRAZOLE SODIUM 40 MG PO TBEC
40.0000 mg | DELAYED_RELEASE_TABLET | Freq: Every day | ORAL | 0 refills | Status: DC
  Filled 2022-01-12: qty 30, 30d supply, fill #0

## 2022-01-12 MED ORDER — METOPROLOL SUCCINATE ER 50 MG PO TB24
75.0000 mg | ORAL_TABLET | Freq: Two times a day (BID) | ORAL | Status: DC
  Administered 2022-01-12: 75 mg via ORAL
  Filled 2022-01-12: qty 1

## 2022-01-12 SURGICAL SUPPLY — 20 items
BLANKET WARM UNDERBOD FULL ACC (MISCELLANEOUS) ×1 IMPLANT
CATH 8FR REPROCESSED SOUNDSTAR (CATHETERS) ×1 IMPLANT
CATH 8FR SOUNDSTAR REPROCESSED (CATHETERS) IMPLANT
CATH ABLAT QDOT MICRO BI TC DF (CATHETERS) IMPLANT
CATH OCTARAY 2.0 F 3-3-3-3-3 (CATHETERS) IMPLANT
CATH S-M CIRCA TEMP PROBE (CATHETERS) IMPLANT
CATH WEBSTER BI DIR CS D-F CRV (CATHETERS) IMPLANT
CLOSURE PERCLOSE PROSTYLE (VASCULAR PRODUCTS) IMPLANT
COVER SWIFTLINK CONNECTOR (BAG) ×1 IMPLANT
PACK EP LATEX FREE (CUSTOM PROCEDURE TRAY) ×1
PACK EP LF (CUSTOM PROCEDURE TRAY) ×1 IMPLANT
PAD DEFIB RADIO PHYSIO CONN (PAD) ×1 IMPLANT
PATCH CARTO3 (PAD) IMPLANT
SHEATH BAYLIS TRANSSEPTAL 98CM (NEEDLE) IMPLANT
SHEATH CARTO VIZIGO SM CVD (SHEATH) IMPLANT
SHEATH INTRO SL0 8.5F 63 (SHEATH) IMPLANT
SHEATH PINNACLE 8F 10CM (SHEATH) IMPLANT
SHEATH PINNACLE 9F 10CM (SHEATH) IMPLANT
SHEATH PROBE COVER 6X72 (BAG) IMPLANT
TUBING SMART ABLATE COOLFLOW (TUBING) IMPLANT

## 2022-01-12 NOTE — Anesthesia Preprocedure Evaluation (Signed)
Anesthesia Evaluation  Patient identified by MRN, date of birth, ID band Patient awake    Reviewed: Allergy & Precautions, H&P , NPO status , Patient's Chart, lab work & pertinent test results  Airway Mallampati: II   Neck ROM: full    Dental   Pulmonary sleep apnea    breath sounds clear to auscultation       Cardiovascular hypertension, + dysrhythmias Atrial Fibrillation  Rhythm:irregular Rate:Normal     Neuro/Psych  PSYCHIATRIC DISORDERS Anxiety        GI/Hepatic   Endo/Other    Morbid obesity  Renal/GU      Musculoskeletal   Abdominal   Peds  Hematology   Anesthesia Other Findings   Reproductive/Obstetrics H/o breast CA                             Anesthesia Physical Anesthesia Plan  ASA: 3  Anesthesia Plan: General   Post-op Pain Management:    Induction: Intravenous  PONV Risk Score and Plan: 3 and Ondansetron, Dexamethasone, Midazolam and Treatment may vary due to age or medical condition  Airway Management Planned: Oral ETT  Additional Equipment:   Intra-op Plan:   Post-operative Plan: Extubation in OR  Informed Consent: I have reviewed the patients History and Physical, chart, labs and discussed the procedure including the risks, benefits and alternatives for the proposed anesthesia with the patient or authorized representative who has indicated his/her understanding and acceptance.     Dental advisory given  Plan Discussed with: CRNA, Anesthesiologist and Surgeon  Anesthesia Plan Comments:        Anesthesia Quick Evaluation

## 2022-01-12 NOTE — Discharge Instructions (Addendum)
Cardiac Ablation, Care After  This sheet gives you information about how to care for yourself after your procedure. Your health care provider may also give you more specific instructions. If you have problems or questions, contact your health care provider. What can I expect after the procedure? After the procedure, it is common to have: Bruising around your puncture site. Tenderness around your puncture site. Skipped heartbeats. If you had an atrial fibrillation ablation, you may have atrial fibrillation during the first several months after your procedure.  Tiredness (fatigue).  Follow these instructions at home: Puncture site care  Follow instructions from your health care provider about how to take care of your puncture site. Make sure you: If present, leave stitches (sutures), skin glue, or adhesive strips in place. These skin closures may need to stay in place for up to 2 weeks. If adhesive strip edges start to loosen and curl up, you may trim the loose edges. Do not remove adhesive strips completely unless your health care provider tells you to do that. If a large square bandage is present, this may be removed 24 hours after surgery.  Check your puncture site every day for signs of infection. Check for: Redness, swelling, or pain. Fluid or blood. If your puncture site starts to bleed, lie down on your back, apply firm pressure to the area, and contact your health care provider. Warmth. Pus or a bad smell. A pea or small marble sized lump at the site is normal and can take up to three months to resolve.  Driving Do not drive for at least 4 days after your procedure or however long your health care provider recommends. (Do not resume driving if you have previously been instructed not to drive for other health reasons.) Do not drive or use heavy machinery while taking prescription pain medicine. Activity Avoid activities that take a lot of effort for at least 7 days after your  procedure. Do not lift anything that is heavier than 5 lb (4.5 kg) for one week.  No sexual activity for 1 week.  Return to your normal activities as told by your health care provider. Ask your health care provider what activities are safe for you. General instructions Take over-the-counter and prescription medicines only as told by your health care provider. Do not use any products that contain nicotine or tobacco, such as cigarettes and e-cigarettes. If you need help quitting, ask your health care provider. You may shower after 24 hours, but Do not take baths, swim, or use a hot tub for 1 week.  Do not drink alcohol for 24 hours after your procedure. Keep all follow-up visits as told by your health care provider. This is important. Contact a health care provider if: You have redness, mild swelling, or pain around your puncture site. You have fluid or blood coming from your puncture site that stops after applying firm pressure to the area. Your puncture site feels warm to the touch. You have pus or a bad smell coming from your puncture site. You have a fever. You have chest pain or discomfort that spreads to your neck, jaw, or arm. You have chest pain that is worse with lying on your back or taking a deep breath. You are sweating a lot. You feel nauseous. You have a fast or irregular heartbeat. You have shortness of breath. You are dizzy or light-headed and feel the need to lie down. You have pain or numbness in the arm or leg closest to your puncture  site. Get help right away if: Your puncture site suddenly swells. Your puncture site is bleeding and the bleeding does not stop after applying firm pressure to the area. These symptoms may represent a serious problem that is an emergency. Do not wait to see if the symptoms will go away. Get medical help right away. Call your local emergency services (911 in the [place] Do not drive yourself to the hospital. Summary After the procedure, it  is normal to have bruising and tenderness at the puncture site in your groin, neck, or forearm. Check your puncture site every day for signs of infection. Get help right away if your puncture site is bleeding and the bleeding does not stop after applying firm pressure to the area. This is a medical emergency. This information is not intended to replace advice given to you by your health care provider. Make sure you discuss any questions you have with your health care provider. Cardiac Ablation, Care After  This sheet gives you information about how to care for yourself after your procedure. Your health care provider may also give you more specific instructions. If you have problems or questions, contact your health care provider. What can I expect after the procedure? After the procedure, it is common to have: Bruising around your puncture site. Tenderness around your puncture site. Skipped heartbeats. If you had an atrial fibrillation ablation, you may have atrial fibrillation during the first several months after your procedure.  Tiredness (fatigue).  Follow these instructions at home: Puncture site care  Follow instructions from your health care provider about how to take care of your puncture site. Make sure you: If present, leave stitches (sutures), skin glue, or adhesive strips in place. These skin closures may need to stay in place for up to 2 weeks. If adhesive strip edges start to loosen and curl up, you may trim the loose edges. Do not remove adhesive strips completely unless your health care provider tells you to do that. If a large square bandage is present, this may be removed 24 hours after surgery.  Check your puncture site every day for signs of infection. Check for: Redness, swelling, or pain. Fluid or blood. If your puncture site starts to bleed, lie down on your back, apply firm pressure to the area, and contact your health care provider. Warmth. Pus or a bad smell. A pea  or small marble sized lump at the site is normal and can take up to three months to resolve.  Driving Do not drive for at least 4 days after your procedure or however long your health care provider recommends. (Do not resume driving if you have previously been instructed not to drive for other health reasons.) Do not drive or use heavy machinery while taking prescription pain medicine. Activity Avoid activities that take a lot of effort for at least 7 days after your procedure. Do not lift anything that is heavier than 5 lb (4.5 kg) for one week.  No sexual activity for 1 week.  Return to your normal activities as told by your health care provider. Ask your health care provider what activities are safe for you. General instructions Take over-the-counter and prescription medicines only as told by your health care provider. Do not use any products that contain nicotine or tobacco, such as cigarettes and e-cigarettes. If you need help quitting, ask your health care provider. You may shower after 24 hours, but Do not take baths, swim, or use a hot tub for 1 week.  Do not drink alcohol for 24 hours after your procedure. Keep all follow-up visits as told by your health care provider. This is important. Contact a health care provider if: You have redness, mild swelling, or pain around your puncture site. You have fluid or blood coming from your puncture site that stops after applying firm pressure to the area. Your puncture site feels warm to the touch. You have pus or a bad smell coming from your puncture site. You have a fever. You have chest pain or discomfort that spreads to your neck, jaw, or arm. You have chest pain that is worse with lying on your back or taking a deep breath. You are sweating a lot. You feel nauseous. You have a fast or irregular heartbeat. You have shortness of breath. You are dizzy or light-headed and feel the need to lie down. You have pain or numbness in the arm or  leg closest to your puncture site. Get help right away if: Your puncture site suddenly swells. Your puncture site is bleeding and the bleeding does not stop after applying firm pressure to the area. These symptoms may represent a serious problem that is an emergency. Do not wait to see if the symptoms will go away. Get medical help right away. Call your local emergency services (911 in the [place] Do not drive yourself to the hospital. Summary After the procedure, it is normal to have bruising and tenderness at the puncture site in your groin, neck, or forearm. Check your puncture site every day for signs of infection. Get help right away if your puncture site is bleeding and the bleeding does not stop after applying firm pressure to the area. This is a medical emergency. This information is not intended to replace advice given to you by your health care provider. Make sure you discuss any questions you have with your health care provider. Post procedure care instructions No driving for 4 days. No lifting over 5 lbs for 1 week. No vigorous or sexual activity for 1 week. You may return to work/your usual activities on 01/20/22. Keep procedure site clean & dry. If you notice increased pain, swelling, bleeding or pus, call/return!  You may shower after 24 hours, but no soaking in baths/hot tubs/pools for 1 week.    You have an appointment set up with the [facility].  Multiple studies have shown that being followed by a dedicated atrial fibrillation clinic in addition to the standard care you receive from your other physicians improves health. We believe that enrollment in the atrial fibrillation clinic will allow [place] to better care for you.   The phone number to the [facility] is [phone]. The clinic is staffed Monday through Friday from 8:30am to 5pm.  Directions: The clinic is located in the [facility], [place] the hospital at the MAIN ENTRANCE  "A", use [address] to the 6th floor.  Registration desk to the right of elevators on 6th floor  If you have any trouble locating the clinic, please don't hesitate to call [phone].

## 2022-01-12 NOTE — H&P (Signed)
Electrophysiology Office Note:     Date:  01/12/2022    ID:  Heather Dudley, DOB 1961-01-11, MRN [id number]   PCP:  Glean Hess, MD        [facility] Cardiologist:  Ida Rogue, MD  [facility] Electrophysiologist:  Vickie Epley, MD    Referring MD: Antony Madura, PA-C    Chief Complaint: AF   History of Present Illness:     Heather Dudley is a 61 y.o. female who presents for an evaluation of AF at the request of Heather Furth, PA-C. Their medical history includes HTN, breast CA, OSA on CPAP and pAF. She was previously prescribed multaq but this medication was never filled due to excessive cost. Next she was prescribed propafenone but this makes her tired and thus she takes only once per day. Flecainide caused her to feel "scattered".    She was admitted 12/05/2021 with chest pain. Troponins mildly elevated. Cath without obstructive disease. Her symptoms were thought to be secondary to AF w/ RVR. She takes eliquis for stroke ppx.   She is with her husband today in clinic.    Presents today for PVI. Procedure reviewed.     Objective      Past Medical History:  Diagnosis Date   Basal cell carcinoma of forehead      birthmark of forehead   Breast cancer (HCC)     Breast screening, unspecified     Cellulitis and abscess of trunk     Diastolic dysfunction      a. 05/2019 Echo: EF 55-60%, no rwma, mild LVH, Gr2 DD, Nl RV size/fxn. Triv MR.   Hernia     History of chemotherapy 2012   Hypertension     Lump or mass in breast     Malignant neoplasm of breast (female), unspecified site      She underwent wide excision, mastoplasty and axillary dissection for her left breast malignancy on September 03, 2010. The primary tumor was 1 cm in diameter, and a single positive axillary node 1.3 cm in diameter. This was an ER-positive, PR slightly positive, HER-2/neu not overexpressing tumor. She tolerated her whole breast radiation without difficulty ending in late  February 2013.   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 04/2019 Zio: RSR, avg HR 79. 2% PAF burden (73-175, avg 115; longest 47mns 28secs). CHA2DS2VASc = 2-->Xarelto.   Personal history of chemotherapy     Personal history of malignant neoplasm of breast      The patient underwent wide excision, mastoplasty. Dissection for a T1b, N1, M0 carcinoma left breast on September 03, 2010.The primary tumor was 1 cm in diameter, and a single positive axillary node 1.3 cm in diameter. This was an ER-positive, PR slightly positive, HER-2/neu not overexpressing tumor.  The patient completed whole breast radiation in February 2013.   Personal history of radiation therapy 2013    LEFT lumpectomy   Screening for obesity     Thyroid nodule             Past Surgical History:  Procedure Laterality Date   ABDOMINAL HYSTERECTOMY   2011    ovaries remain   birth mark removed       BREAST CYST ASPIRATION Right      negative  01/2010   BREAST EXCISIONAL BIOPSY   08/05/2010    left breast positive 07/2010   BREAST LUMPECTOMY   2012    left breast   COLONOSCOPY   07/29/2012    normal study.   LEFT HEART CATH AND CORONARY ANGIOGRAPHY N/A 12/08/2021    Procedure: LEFT HEART CATH AND CORONARY ANGIOGRAPHY;  Surgeon: Wellington Hampshire, MD;  Location: [facility];  Service: Cardiovascular;  Laterality: N/A;   robotic tonsillectomy Lingual tonsils Bilateral 06/08/2016   THYROID SURGERY        partially removed   TUBAL LIGATION          Current Medications: Active Medications      Current Meds  Medication Sig   apixaban (ELIQUIS) 5 MG TABS tablet Take 1 tablet (5 mg total) by mouth 2 (two) times daily.   atorvastatin (LIPITOR) 40 MG tablet Take 1 tablet (40 mg total) by mouth daily.   B Complex-Biotin-FA (B-COMPLEX PO)  Take 1 tablet by mouth daily.   ezetimibe (ZETIA) 10 MG tablet Take 1 tablet (10 mg total) by mouth daily.   furosemide (LASIX) 20 MG tablet Take 1 tablet (20 mg total) by mouth daily. Increase to 1 tablet (20 mg total) by mouth TWICE daily (total daily dose 40 mg) as needed for up to 3 days for increased leg swelling, shortness of breath, weight gain 5+ lbs over 1-2 days. Seek medical care if these symptoms are not improving with increased dose.   levothyroxine (SYNTHROID) 75 MCG tablet Take 75 mcg by mouth daily.   LORazepam (ATIVAN) 0.5 MG tablet TAKE 1 TABLET BY MOUTH EVERY 8 HOURS AS NEEDED FOR ANXIETY.   metoprolol succinate (TOPROL-XL) 50 MG 24 hr tablet Take 1.5 tablets (75 mg total) by mouth 2 (two) times daily. Take with or immediately following a meal.   Multiple Vitamin (MULTIVITAMIN) tablet Take 1 tablet by mouth daily.   nitroGLYCERIN (NITROSTAT) 0.4 MG SL tablet Place 1 tablet (0.4 mg total) under the tongue every 5 (five) minutes as needed for chest pain.   potassium chloride SA (KLOR-CON M) 20 MEQ tablet Take 1 tablet (20 mEq total) by mouth daily.   propafenone (RYTHMOL SR) 225 MG 12 hr capsule Take 225 mg by mouth 2 (two) times daily.   [DISCONTINUED] metoprolol succinate (TOPROL-XL) 100 MG 24 hr tablet Take 100 mg by mouth daily. Take with or immediately following a meal.        Allergies:   Ace inhibitors, Sernivo [betamethasone dipropionate], and Penicillins    Social History         Socioeconomic History   Marital status: Married      Spouse name: Heather Dudley   Number of children: 3   Years of education: 16   Highest education level: Bachelor's degree (e.g., BA, AB, BS)  Occupational History   Not on file  Tobacco Use   Smoking status: Never   Smokeless tobacco: Never  Vaping Use   Vaping Use: Never used  Substance and Sexual Activity   Alcohol use: Not Currently      Comment: occ   Drug use: Never   Sexual activity: Yes      Partners: Male      Birth  control/protection: Surgical  Other Topics Concern   Not on file  Social History Narrative   Not on file    Social Determinants of Health        Financial Resource Strain: Low Risk  (07/15/2021)  Overall Financial Resource Strain (CARDIA)     Difficulty of Paying Living Expenses: Not hard at all  Food Insecurity: No Food Insecurity (12/06/2021)    Hunger Vital Sign     Worried About Running Out of Food in the Last Year: Never true     Ran Out of Food in the Last Year: Never true  Transportation Needs: No Transportation Needs (12/06/2021)    PRAPARE - Armed forces logistics/support/administrative officer (Medical): No     Lack of Transportation (Non-Medical): No  Physical Activity: Not on file  Stress: Not on file  Social Connections: Not on file      Family History: The patient's family history includes Cancer in her cousin, maternal uncle, mother, and paternal aunt; Colon cancer in an other family member; Heart failure in her father; Ovarian cancer in an other family member. There is no history of Breast cancer.   ROS:   Please see the history of present illness.    All other systems reviewed and are negative.   EKGs/Labs/Other Studies Reviewed:     The following studies were reviewed today:       EKG:  The ekg ordered today demonstrates atrial fibrillation with a ventricular rate of 107 bpm.  The QRS is narrow, 86 ms.     Recent Labs: 07/15/2021: ALT 12 12/06/2021: Magnesium 1.9; TSH 5.447 12/09/2021: BUN 12; Creatinine, Ser 0.98; Hemoglobin 13.1; Platelets 270; Potassium 3.9; Sodium 137  Recent Lipid Panel Labs (Brief)          Component Value Date/Time    CHOL 271 (H) 07/15/2021 1039    TRIG 133 07/15/2021 1039    HDL 44 07/15/2021 1039    CHOLHDL 6.2 (H) 07/15/2021 1039    LDLCALC 203 (H) 07/15/2021 1039        Physical Exam:     VS:  BP 126/91   Pulse 95   Ht _0  (1.702 m)   Wt 267 lb (121.1 kg)   LMP 04/01/2008   SpO2 96%   BMI 41.82 kg/m         Wt  Readings from Last 3 Encounters:  12/10/21 267 lb (121.1 kg)  12/08/21 267 lb (121.1 kg)  09/23/21 272 lb 12.8 oz (123.7 kg)      GEN:  Well nourished, well developed in no acute distress.  Obese HEENT: Normal NECK: No JVD; No carotid bruits LYMPHATICS: No lymphadenopathy CARDIAC: Irregularly irregular, no murmurs, rubs, gallops RESPIRATORY:  Clear to auscultation without rales, wheezing or rhonchi  ABDOMEN: Soft, non-tender, non-distended MUSCULOSKELETAL:  No edema; No deformity  SKIN: Warm and dry NEUROLOGIC:  Alert and oriented x 3 PSYCHIATRIC:  Normal affect          Assessment ASSESSMENT:     1. Paroxysmal atrial fibrillation with RVR ([place])   2. Essential hypertension   3. OSA on CPAP   4. Pre-op evaluation     PLAN:     In order of problems listed above:   #Persistent atrial fibrillation Symptomatic.  Resistant despite treatment with flecainide and propafenone in the past.  Her rates are poorly controlled.  She is on Eliquis for stroke prophylaxis.   I will increase her metoprolol succinate to 75 mg by mouth twice daily to see if we can improve her rate control.   Discussed treatment options today for her AF including antiarrhythmic drug therapy and ablation. Discussed risks, recovery and likelihood of success. Discussed potential need for repeat ablation procedures and  antiarrhythmic drugs after an initial ablation. They wish to proceed with scheduling.   Risk, benefits, and alternatives to EP study and radiofrequency ablation for afib were also discussed in detail today. These risks include but are not limited to stroke, bleeding, vascular damage, tamponade, perforation, damage to the esophagus, lungs, and other structures, pulmonary vein stenosis, worsening renal function, and death. The patient understands these risk and wishes to proceed.  We will therefore proceed with catheter ablation at the next available time.  Carto, ICE, anesthesia are requested for the  procedure.  Will also obtain CT PV protocol prior to the procedure to exclude LAA thrombus and further evaluate atrial anatomy.    Presents today for PVI. Procedure reviewed.   #Hypertension At goal today.  Recommend checking blood pressures 1-2 times per week at home and recording the values.  Recommend bringing these recordings to the primary care physician.   #OSA on CPAP CPAP compliance encouraged.        Signed, Hilton Cork. Quentin Ore, MD, [facility], [facility] 01/12/2022 Electrophysiology [facility]

## 2022-01-12 NOTE — Anesthesia Procedure Notes (Signed)
Procedure Name: Intubation Date/Time: 01/12/2022 7:46 AM  Performed by: Valda Favia, CRNAPre-anesthesia Checklist: Patient identified, Emergency Drugs available, Suction available and Patient being monitored Patient Re-evaluated:Patient Re-evaluated prior to induction Oxygen Delivery Method: Circle System Utilized Preoxygenation: Pre-oxygenation with 100% oxygen Induction Type: IV induction Ventilation: Oral airway inserted - appropriate to patient size and Mask ventilation without difficulty Laryngoscope Size: Glidescope and 4 Grade View: Grade I Tube type: Oral Tube size: 7.0 mm Number of attempts: 1 Airway Equipment and Method: Stylet and Oral airway Placement Confirmation: ETT inserted through vocal cords under direct vision, positive ETCO2 and breath sounds checked- equal and bilateral Secured at: 23 cm Tube secured with: Tape Dental Injury: Teeth and Oropharynx as per pre-operative assessment

## 2022-01-12 NOTE — Transfer of Care (Signed)
Immediate Anesthesia Transfer of Care Note  Patient: Heather Dudley  Procedure(s) Performed: ATRIAL FIBRILLATION ABLATION  Patient Location: Cath Lab  Anesthesia Type:General  Level of Consciousness: awake and alert   Airway & Oxygen Therapy: Patient Spontanous Breathing and Patient connected to nasal cannula oxygen  Post-op Assessment: Report given to RN and Post -op Vital signs reviewed and stable  Post vital signs: Reviewed and stable  Last Vitals:  Vitals Value Taken Time  BP 146/56 01/12/22 1031  Temp    Pulse 75 01/12/22 1033  Resp 23 01/12/22 1033  SpO2 97 % 01/12/22 1033  Vitals shown include unvalidated device data.  Last Pain:  Vitals:   01/12/22 0607  TempSrc:   PainSc: 0-No pain         Complications: No notable events documented.

## 2022-01-13 ENCOUNTER — Encounter (HOSPITAL_COMMUNITY): Payer: Self-pay | Admitting: Cardiology

## 2022-01-14 NOTE — Anesthesia Postprocedure Evaluation (Signed)
Anesthesia Post Note  Patient: Heather Dudley  Procedure(s) Performed: ATRIAL FIBRILLATION ABLATION     Patient location during evaluation: Cath Lab Anesthesia Type: General Level of consciousness: awake and alert Pain management: pain level controlled Vital Signs Assessment: post-procedure vital signs reviewed and stable Respiratory status: spontaneous breathing, nonlabored ventilation, respiratory function stable and patient connected to nasal cannula oxygen Cardiovascular status: blood pressure returned to baseline and stable Postop Assessment: no apparent nausea or vomiting Anesthetic complications: no   No notable events documented.  Last Vitals:  Vitals:   01/12/22 1330 01/12/22 1400  BP: 126/78 127/89  Pulse: (!) 115 95  Resp: (!) 21 (!) 28  Temp:    SpO2: 96% 95%    Last Pain:  Vitals:   01/12/22 1132  TempSrc:   PainSc: 0-No pain                 Reyana Leisey S

## 2022-01-19 ENCOUNTER — Telehealth: Payer: Self-pay | Admitting: Cardiovascular Disease

## 2022-01-19 NOTE — Telephone Encounter (Signed)
Returned the call to the patient. She stated that she needs a letter stating when she can return to work after the ablation.   She stated she would also like to get a temporary handicap placard since she has to walk a long distance to get to her work.

## 2022-01-19 NOTE — Telephone Encounter (Signed)
Pt would like a callback regarding a letter of when she is able to return to work due to having an Ablation on 11/20. Please advise

## 2022-01-20 ENCOUNTER — Telehealth: Payer: Self-pay | Admitting: Medical

## 2022-01-20 DIAGNOSIS — Z0279 Encounter for issue of other medical certificate: Secondary | ICD-10-CM

## 2022-01-20 NOTE — Telephone Encounter (Signed)
Pt called to following up regarding return to work and handicap placard.

## 2022-01-20 NOTE — Telephone Encounter (Signed)
Follow Up:     Patient is calling back to find out the date she can return to work please. She also needs a handicap placard please.

## 2022-01-20 NOTE — Telephone Encounter (Signed)
Patient dropped off disability forms & paid $29 cash. Forms placed in Dr. Gwenyth Ober box.

## 2022-01-21 NOTE — Telephone Encounter (Addendum)
Spoke with patient and advised that she can return to work and have placed a letter in her chart for return.   She would also like to know if Dr. Quentin Ore would sign a form for her to get a handicap sticker.

## 2022-01-23 NOTE — Telephone Encounter (Signed)
Forms pulled from Dr. Donivan Scull box to review.  Forms are for [employer].  Reviewed the patient's chart and [employer] forms were previously completed by Cadence Furth, PA covering periods from 12/05/21-01/17/22.  I called the patient to inquire if these forms were for her A-fib ablation from 01/12/22?  Per the patient, there has been some confusion about her time away from work due to her health conditions.  The patient states Cadence had her out of work post cath 12/08/21. She returned to work on 12/29/21-01/09/22.  She had her ablation with Dr. Quentin Ore on 01/12/22. She was needing a note for work, but was unable to obtain this from the hospital. She thought she was instructed that she could RTW this week on Wednesday or Thursday.  Per the patient, she did not have a note for work so she took vacation days on Monday & Tuesday (11/27 & 11/28), She returned to work on Wednesday 11/29.  I advised the patient that the current forms seem to be more applicable to her a-fib ablation with Dr. Quentin Ore, therefore we will have him sign her current forms.  The patient voices understanding and is agreeable.

## 2022-01-26 ENCOUNTER — Telehealth: Payer: Self-pay | Admitting: Internal Medicine

## 2022-01-26 NOTE — Telephone Encounter (Signed)
Copied from [place] #[id number]. Topic: General - Other >> Jan 26, 2022  2:22 PM Chapman Fitch wrote: Reason for CRM: Pt recently had a cardiac ablation surgery and needs a Handicap placard for her car so she doesn't have to walk half a mile from car to door or office / please advise

## 2022-01-26 NOTE — Telephone Encounter (Signed)
Sent patient a my chart message informing her of this information.  Heather Dudley

## 2022-01-28 NOTE — Telephone Encounter (Signed)
Discussed the patient's paperwork with Dr. Quentin Ore. Forms given to him to complete- placed in his dictating area today.

## 2022-01-29 DIAGNOSIS — R059 Cough, unspecified: Secondary | ICD-10-CM | POA: Diagnosis not present

## 2022-01-29 DIAGNOSIS — Z20822 Contact with and (suspected) exposure to covid-19: Secondary | ICD-10-CM | POA: Diagnosis not present

## 2022-01-29 DIAGNOSIS — B349 Viral infection, unspecified: Secondary | ICD-10-CM | POA: Diagnosis not present

## 2022-01-30 ENCOUNTER — Telehealth: Payer: Self-pay | Admitting: Cardiology

## 2022-01-30 NOTE — Telephone Encounter (Signed)
Reviewed the patient's chart- forms are scanned in her chart and a copy was placed up front for the patient to pick up as well.

## 2022-01-30 NOTE — Telephone Encounter (Signed)
Additional forms completed and faxed to [facility], patient also informd.

## 2022-01-30 NOTE — Telephone Encounter (Signed)
Faxed completed forms to [employer], patient also notified

## 2022-02-05 DIAGNOSIS — G4733 Obstructive sleep apnea (adult) (pediatric): Secondary | ICD-10-CM | POA: Diagnosis not present

## 2022-02-05 DIAGNOSIS — I10 Essential (primary) hypertension: Secondary | ICD-10-CM | POA: Diagnosis not present

## 2022-02-09 ENCOUNTER — Encounter (HOSPITAL_COMMUNITY): Payer: Self-pay | Admitting: Physician Assistant

## 2022-02-09 ENCOUNTER — Ambulatory Visit (HOSPITAL_COMMUNITY)
Admission: RE | Admit: 2022-02-09 | Discharge: 2022-02-09 | Disposition: A | Discharge status: Home / Self Care | Payer: BC Managed Care – PPO | Source: Ambulatory Visit | Attending: Physician Assistant | Admitting: Physician Assistant

## 2022-02-09 ENCOUNTER — Ambulatory Visit: Payer: Self-pay | Admitting: *Deleted

## 2022-02-09 VITALS — BP 126/82 | HR 68 | Ht 67.0 in | Wt 265.0 lb

## 2022-02-09 DIAGNOSIS — I4819 Other persistent atrial fibrillation: Secondary | ICD-10-CM | POA: Diagnosis not present

## 2022-02-09 DIAGNOSIS — I5032 Chronic diastolic (congestive) heart failure: Secondary | ICD-10-CM | POA: Diagnosis not present

## 2022-02-09 DIAGNOSIS — I4892 Unspecified atrial flutter: Secondary | ICD-10-CM | POA: Insufficient documentation

## 2022-02-09 DIAGNOSIS — D6869 Other thrombophilia: Secondary | ICD-10-CM | POA: Diagnosis not present

## 2022-02-09 DIAGNOSIS — Z853 Personal history of malignant neoplasm of breast: Secondary | ICD-10-CM | POA: Insufficient documentation

## 2022-02-09 DIAGNOSIS — G4733 Obstructive sleep apnea (adult) (pediatric): Secondary | ICD-10-CM | POA: Insufficient documentation

## 2022-02-09 DIAGNOSIS — Z6841 Body Mass Index (BMI) 40.0 and over, adult: Secondary | ICD-10-CM | POA: Insufficient documentation

## 2022-02-09 DIAGNOSIS — E041 Nontoxic single thyroid nodule: Secondary | ICD-10-CM | POA: Insufficient documentation

## 2022-02-09 DIAGNOSIS — I110 Hypertensive heart disease with heart failure: Secondary | ICD-10-CM | POA: Insufficient documentation

## 2022-02-09 DIAGNOSIS — Z7901 Long term (current) use of anticoagulants: Secondary | ICD-10-CM | POA: Insufficient documentation

## 2022-02-09 NOTE — Telephone Encounter (Signed)
Summary: medication question   Patient states that she was seen in the hospital a month ago and was prescribed ezetimibe (ZETIA) 10 MG tablet and it has 0 refills, when she went to the heart doctor she was advised that she still needs to be on this medication. Patient states that she is also taking atorvastatin (LIPITOR) 40 MG tablet and needs to know what she should do.  Please advise       Called patient to clarify medication request. Patient reports she misunderstood she was supposed to get refills and continue taking zetia after she was discharged from the hospital in November. Went to cardiologist and was told to continue taking zetia 10 mg by a nurse. Please advise and if PCP advised to take lipitor and zetia please send Rx to pharmacy listed in chart. Reviewed with patient she has zetia on med list and can get refills noted from 01/01/22. Patient would like to confirm with PCP.           Reason for Disposition  Prescription request for new medicine (not a refill)  Answer Assessment - Initial Assessment Questions 1. NAME of MEDICINE: "What medicine(s) are you calling about?"     Zetia 10 mg  2. QUESTION: "What is your question?" (e.g., double dose of medicine, side effect)     Is patient to continue taking zetia and lipitor ? If so can patient get medication request sent to pharmacy for a refill.  3. PRESCRIBER: "Who prescribed the medicine?" Reason: if prescribed by specialist, call should be referred to that group.     Ordered while in hospital  4. SYMPTOMS: "Do you have any symptoms?" If Yes, ask: "What symptoms are you having?"  "How bad are the symptoms (e.g., mild, moderate, severe)     Has not had zetia refilled. On bottle from hospital from November no refills noted.  5. PREGNANCY:  "Is there any chance that you are pregnant?" "When was your last menstrual period?"     na  Protocols used: Medication Question Call-A-AH

## 2022-02-09 NOTE — Progress Notes (Signed)
Primary Care Physician: Glean Hess, MD Primary Cardiologist: Dr Rockey Situ Primary Electrophysiologist: Dr Quentin Ore Referring Physician: Dr [name] is a 60 y.o. female with a history of CAD, chronic diastolic CHF, OSA, HTN, breast cancer, thyroid nodule, atrial flutter, atrial fibrillation who presents for follow up in the [facility]. She was previously prescribed multaq but this medication was never filled due to excessive cost. Next she was prescribed propafenone but this makes her tired and thus she takes only once per day. Flecainide caused her to feel "scattered". Patient is on Eliquis for a CHADS2VASC score of 3. She was admitted 12/05/2021 with chest pain. Troponins mildly elevated. Cath without obstructive disease. Her symptoms were thought to be secondary to AF w/ RVR. She was evaluated by Dr Quentin Ore and underwent afib and flutter ablation 01/12/22.  On follow up today, patient reports that she has had several episodes of afib since the ablation, mostly in the evening. However, they are becoming more infrequent. She states she has more energy and is sleeping better at night. She denies chest pain, swallowing pain, or groin issues.   Today, she denies symptoms of palpitations, chest pain, shortness of breath, orthopnea, PND, lower extremity edema, dizziness, presyncope, syncope, bleeding, or neurologic sequela. The patient is tolerating medications without difficulties and is otherwise without complaint today.    Atrial Fibrillation Risk Factors:  she does have symptoms or diagnosis of sleep apnea. she is compliant with CPAP therapy. she does not have a history of rheumatic fever.   she has a BMI of Body mass index is 41.5 kg/m.[name] Filed Weights   02/09/22 1509  Weight: 120.2 kg    Family History  Problem Relation Age of Onset   Colon cancer Other    Ovarian cancer Other    Cancer Mother        lung ca   Cancer Maternal  Uncle        lung ca   Cancer Paternal Aunt        ovarian ca   Cancer Cousin        female ca   Heart failure Father    Breast cancer Neg Hx      Atrial Fibrillation Management history:  Previous antiarrhythmic drugs: flecainide, propafenone Previous cardioversions: none Previous ablations: 01/12/22 CHADS2VASC score: 3 Anticoagulation history: Eliquis   Past Medical History:  Diagnosis Date   Basal cell carcinoma of forehead    birthmark of forehead   Breast cancer (HCC)    Breast screening, unspecified    Cellulitis and abscess of trunk    Diastolic dysfunction    a. 05/2019 Echo: EF 55-60%, no rwma, mild LVH, Gr2 DD, Nl RV size/fxn. Triv MR.   Hernia    History of chemotherapy 2012   Hypertension    Lump or mass in breast    Malignant neoplasm of breast (female), unspecified site    She underwent wide excision, mastoplasty and axillary dissection for her left breast malignancy on September 03, 2010. The primary tumor was 1 cm in diameter, and a single positive axillary node 1.3 cm in diameter. This was an ER-positive, PR slightly positive, HER-2/neu not overexpressing tumor. She tolerated her whole breast radiation without difficulty ending in late February 2013.   Malignant neoplasm of upper-outer quadrant of female breast ([facility])    Neoplasm of uncertain behavior of connective and other soft tissue    Obesity, unspecified    PAF (paroxysmal atrial fibrillation) (  [place]) 05/04/2017   a. 04/2019 Zio: RSR, avg HR 79. 2% PAF burden (73-175, avg 115; longest 57mns 28secs). CHA2DS2VASc = 2-->Xarelto.   Personal history of chemotherapy    Personal history of malignant neoplasm of breast    The patient underwent wide excision, mastoplasty. Dissection for a T1b, N1, M0 carcinoma left breast on September 03, 2010.The primary tumor was 1 cm in diameter, and a single positive axillary node 1.3 cm in diameter. This was an ER-positive, PR slightly positive, HER-2/neu not overexpressing tumor.  The  patient completed whole breast radiation in February 2013.   Personal history of radiation therapy 2013   LEFT lumpectomy   Screening for obesity    Thyroid nodule    Past Surgical History:  Procedure Laterality Date   ABDOMINAL HYSTERECTOMY  2011   ovaries remain   ATRIAL FIBRILLATION ABLATION N/A 01/12/2022   Procedure: ATRIAL FIBRILLATION ABLATION;  Surgeon: LVickie Epley MD;  [facility];  Service: Cardiovascular;  Laterality: N/A;   birth mark removed     BREAST CYST ASPIRATION Right    negative 01/2010   BREAST EXCISIONAL BIOPSY  08/05/2010   left breast positive 07/2010   BREAST LUMPECTOMY  2012   left breast   COLONOSCOPY  07/29/2012   normal study.   LEFT HEART CATH AND CORONARY ANGIOGRAPHY N/A 12/08/2021   Procedure: LEFT HEART CATH AND CORONARY ANGIOGRAPHY;  Surgeon: AWellington Hampshire MD;  [facility];  Service: Cardiovascular;  Laterality: N/A;   robotic tonsillectomy Lingual tonsils Bilateral 06/08/2016   THYROID SURGERY     partially removed   TUBAL LIGATION      Current Outpatient Medications  Medication Sig Dispense Refill   apixaban (ELIQUIS) 5 MG TABS tablet Take 1 tablet (5 mg total) by mouth 2 (two) times daily. 60 tablet 5   atorvastatin (LIPITOR) 40 MG tablet TAKE 1 TABLET BY MOUTH EVERY DAY 90 tablet 1   B Complex-Biotin-FA (B-COMPLEX PO) Take 1 tablet by mouth daily.     ezetimibe (ZETIA) 10 MG tablet TAKE 1 TABLET BY MOUTH EVERY DAY 90 tablet 1   furosemide (LASIX) 20 MG tablet TAKE 1 TABLET (20 MG TOTAL) BY MOUTH DAILY. INCREASE TO 1 TABLET (20 MG TOTAL) BY MOUTH TWICE DAILY (TOTAL DAILY DOSE 40 MG) AS NEEDED FOR UP TO 3 DAYS FOR INCREASED LEG SWELLING, SHORTNESS OF BREATH, WEIGHT GAIN 5+ LBS OVER 1-2 DAYS. SEEK MEDICAL CARE IF THESE SYMPTOMS ARE NOT IMPROVING WITH INCREASED DOSE. 135 tablet 1   levothyroxine (SYNTHROID) 75 MCG tablet Take 75 mcg by mouth daily.     LORazepam (ATIVAN) 0.5 MG tablet Take 1 tablet  (0.5 mg total) by mouth every 8 (eight) hours as needed. for anxiety 20 tablet 0   metoprolol succinate (TOPROL-XL) 50 MG 24 hr tablet Take 1.5 tablets (75 mg total) by mouth 2 (two) times daily. Take with or immediately following a meal. 270 tablet 3   Multiple Vitamin (MULTIVITAMIN) tablet Take 1 tablet by mouth daily.     nitroGLYCERIN (NITROSTAT) 0.4 MG SL tablet Place 1 tablet (0.4 mg total) under the tongue every 5 (five) minutes as needed for chest pain. 30 tablet 0   pantoprazole (PROTONIX) 40 MG tablet Take 1 tablet (40 mg total) by mouth daily. 45 tablet 0   potassium chloride SA (KLOR-CON M) 20 MEQ tablet Take 1 tablet (20 mEq total) by mouth daily. 90 tablet 3   propafenone (RYTHMOL SR) 225 MG 12  hr capsule Take 225 mg by mouth 2 (two) times daily.     colchicine 0.6 MG tablet Take 1 tablet (0.6 mg total) by mouth 2 (two) times daily for 5 days. 10 tablet 0   No current facility-administered medications for this encounter.    Allergies  Allergen Reactions   Ace Inhibitors Swelling   Sernivo [Betamethasone Dipropionate] Itching   Penicillins Rash    Has patient had a PCN reaction causing immediate rash, facial/tongue/throat swelling, SOB or lightheadedness with hypotension: Yes Has patient had a PCN reaction causing severe rash involving mucus membranes or skin necrosis: No Has patient had a PCN reaction that required hospitalization: No Has patient had a PCN reaction occurring within the last 10 years: No If all of the above answers are "NO", then may proceed with Cephalosporin use.    Social History   Socioeconomic History   Marital status: Married    Spouse name: Ossie Tiller   Number of children: 3   Years of education: 16   Highest education level: Bachelor's degree (e.g., BA, AB, BS)  Occupational History   Not on file  Tobacco Use   Smoking status: Never   Smokeless tobacco: Never   Tobacco comments:    Never smoke 02/09/22  Vaping Use   Vaping Use: Never  used  Substance and Sexual Activity   Alcohol use: Not Currently    Comment: occ   Drug use: Never   Sexual activity: Yes    Partners: Male    Birth control/protection: Surgical  Other Topics Concern   Not on file  Social History Narrative   Not on file   Social Determinants of Health   Financial Resource Strain: Low Risk  (07/15/2021)   Overall Financial Resource Strain (CARDIA)    Difficulty of Paying Living Expenses: Not hard at all  Food Insecurity: No Food Insecurity (12/06/2021)   Hunger Vital Sign    Worried About Running Out of Food in the Last Year: Never true    Ran Out of Food in the Last Year: Never true  Transportation Needs: No Transportation Needs (12/06/2021)   PRAPARE - Hydrologist (Medical): No    Lack of Transportation (Non-Medical): No  Physical Activity: Not on file  Stress: Not on file  Social Connections: Not on file  Intimate Partner Violence: Not At Risk (12/06/2021)   Humiliation, Afraid, Rape, and Kick questionnaire    Fear of Current or Ex-Partner: No    Emotionally Abused: No    Physically Abused: No    Sexually Abused: No     ROS- All systems are reviewed and negative except as per the HPI above.  Physical Exam: Vitals:   02/09/22 1509  BP: 126/82  Pulse: 68  Weight: 120.2 kg  Height: _0  (1.702 m)    GEN- The patient is a well appearing obese female, alert and oriented x 3 today.   Head- normocephalic, atraumatic Eyes-  Sclera clear, conjunctiva pink Ears- hearing intact Oropharynx- clear Neck- supple  Lungs- Clear to ausculation bilaterally, normal work of breathing Heart- Regular rate and rhythm, no murmurs, rubs or gallops  GI- soft, NT, ND, + BS Extremities- no clubbing, cyanosis, or edema MS- no significant deformity or atrophy Skin- no rash or lesion Psych- euthymic mood, full affect Neuro- strength and sensation are intact  Wt Readings from Last 3 Encounters:  02/09/22 120.2 kg   01/12/22 122.5 kg  12/27/21 122.5 kg    EKG today  demonstrates  SR Vent. rate 68 BPM PR interval 188 ms QRS duration 88 ms QT/QTcB 406/431 ms  Echo 12/07/21 demonstrated   1. Left ventricular ejection fraction, by estimation, is 55 to 60%. The  left ventricle has normal function. The left ventricle has no regional  wall motion abnormalities. Left ventricular diastolic parameters were  normal.   2. Right ventricular systolic function is normal. The right ventricular  size is normal. There is normal pulmonary artery systolic pressure.   3. Left atrial size was mildly dilated.   4. The mitral valve is normal in structure. Trivial mitral valve  regurgitation. No evidence of mitral stenosis.   5. The aortic valve is tricuspid. Aortic valve regurgitation is not  visualized. No aortic stenosis is present.   6. The inferior vena cava is normal in size with greater than 50%  respiratory variability, suggesting right atrial pressure of 3 mmHg.   Comparison(s): No significant change from prior study.   Conclusion(s)/Recommendation(s): Normal biventricular function without  evidence of hemodynamically significant valvular heart disease.   Epic records are reviewed at length today  CHA2DS2-VASc Score = 3  The patient's score is based upon: CHF History: 0 HTN History: 1 Diabetes History: 0 Stroke History: 0 Vascular Disease History: 1 (aortic atherosclerosis on CT) Age Score: 0 Gender Score: 1       ASSESSMENT AND PLAN: 1. Persistent Atrial Fibrillation/atrial flutter The patient's CHA2DS2-VASc score is 3, indicating a 3.2% annual risk of stroke.   S/p afib and flutter ablation 01/12/22 Patient has had some episodes of afib (smart watch strips personally reviewed). They are occurring less frequently recently. We discussed that it is not unusual to have afib soon after ablation. Hopefully, this will continue to improve as she continues to heal.  Continue Eliquis 5 mg BID with no  missed doses for 3 months post ablation. Continue Toprol 75 mg BID Continue propafenone 225 mg BID Smart watch for home monitoring  2. Secondary Hypercoagulable State (ICD10:  D68.69) The patient is at significant risk for stroke/thromboembolism based upon her CHA2DS2-VASc Score of 3.  Continue Apixaban (Eliquis).   3. Obesity Body mass index is 41.5 kg/m. Lifestyle modification was discussed at length including regular exercise and weight reduction.  4. Obstructive sleep apnea The importance of adequate treatment of sleep apnea was discussed today in order to improve our ability to maintain sinus rhythm long term. Encouraged compliance with CPAP therapy.   5. HTN Stable, no changes today.   Follow up with Dr Quentin Ore as scheduled.    [facility] [address] [place], [place] [address] [phone] 02/09/2022 4:33 PM

## 2022-02-10 NOTE — Telephone Encounter (Signed)
Please review.  KP

## 2022-02-10 NOTE — Telephone Encounter (Signed)
Pt called and aware. She verbalized understanding.  KP

## 2022-02-11 ENCOUNTER — Other Ambulatory Visit: Payer: BC Managed Care – PPO

## 2022-02-20 ENCOUNTER — Other Ambulatory Visit: Payer: Self-pay | Admitting: Family Medicine

## 2022-02-20 ENCOUNTER — Ambulatory Visit
Admission: RE | Admit: 2022-02-20 | Discharge: 2022-02-20 | Disposition: A | Discharge status: Home / Self Care | Payer: BC Managed Care – PPO | Source: Ambulatory Visit | Attending: Family Medicine | Admitting: Family Medicine

## 2022-02-20 DIAGNOSIS — J209 Acute bronchitis, unspecified: Secondary | ICD-10-CM

## 2022-03-02 ENCOUNTER — Other Ambulatory Visit: Payer: Self-pay

## 2022-03-02 DIAGNOSIS — I480 Paroxysmal atrial fibrillation: Secondary | ICD-10-CM

## 2022-03-02 MED ORDER — APIXABAN 5 MG PO TABS: 5.0000 mg | ORAL_TABLET | Freq: Two times a day (BID) | ORAL | 5 refills | Status: DC

## 2022-03-02 NOTE — Telephone Encounter (Signed)
Refill Request.  

## 2022-03-02 NOTE — Telephone Encounter (Signed)
Eliquis '5mg'$  refill request received. Patient is 62 years old, weight-120.2kg, Crea-1.19 on 12/27/2021, Diagnosis-Afib, and last seen by Malka So on 02/09/2022. Dose is appropriate based on dosing criteria. Will send in refill to requested pharmacy.

## 2022-03-08 DIAGNOSIS — G4733 Obstructive sleep apnea (adult) (pediatric): Secondary | ICD-10-CM | POA: Diagnosis not present

## 2022-03-08 DIAGNOSIS — I10 Essential (primary) hypertension: Secondary | ICD-10-CM | POA: Diagnosis not present

## 2022-03-18 ENCOUNTER — Ambulatory Visit: Payer: BC Managed Care – PPO | Admitting: Internal Medicine

## 2022-03-18 ENCOUNTER — Encounter: Payer: Self-pay | Admitting: Internal Medicine

## 2022-03-18 VITALS — BP 128/80 | HR 67 | Ht 67.0 in | Wt 264.2 lb

## 2022-03-18 DIAGNOSIS — D6869 Other thrombophilia: Secondary | ICD-10-CM | POA: Insufficient documentation

## 2022-03-18 DIAGNOSIS — I10 Essential (primary) hypertension: Secondary | ICD-10-CM | POA: Diagnosis not present

## 2022-03-18 DIAGNOSIS — C779 Secondary and unspecified malignant neoplasm of lymph node, unspecified: Secondary | ICD-10-CM | POA: Insufficient documentation

## 2022-03-18 DIAGNOSIS — R7303 Prediabetes: Secondary | ICD-10-CM | POA: Diagnosis not present

## 2022-03-18 DIAGNOSIS — E782 Mixed hyperlipidemia: Secondary | ICD-10-CM | POA: Diagnosis not present

## 2022-03-18 DIAGNOSIS — I5032 Chronic diastolic (congestive) heart failure: Secondary | ICD-10-CM | POA: Insufficient documentation

## 2022-03-18 LAB — POCT GLYCOSYLATED HEMOGLOBIN (HGB A1C): Hemoglobin A1C: 6.3 % — AB (ref 4.0–5.6)

## 2022-03-18 NOTE — Assessment & Plan Note (Addendum)
Clinically stable exam with well controlled BP on metoprolol and furosemide. Tolerating medications without side effects. Recommend metoprolol 100 mg AM and 50 mg PM and move PM dose closer to suppertime Reduce sodium intake Take lasix bid for 3 days

## 2022-03-18 NOTE — Patient Instructions (Signed)
Change metoprolol dosing to 2 tabs in the AM and 1 tab in the PM (6 PM)  Take Furosemide 20 mg twice a day for the next 3 days then resume once a day.

## 2022-03-18 NOTE — Progress Notes (Signed)
Date:  03/18/2022   Name:  Heather Dudley   DOB:  October 22, 1960   MRN:  [id number]   Chief Complaint: Hypertension (BP is good in early afternoon but is high by 7pm and doesn't take another pill until 10pm 12 hours after 1st dose) and Prediabetes  Hypertension This is a chronic problem. The problem has been waxing and waning (BP good in the AM but very high in the PM) since onset. The problem is controlled. Pertinent negatives include no chest pain, headaches, palpitations or shortness of breath. Past treatments include beta blockers and diuretics. The current treatment provides significant improvement. There is no history of kidney disease, CAD/MI or CVA.  Diabetes She presents for her follow-up diabetic visit. Diabetes type: prediabetes. Her disease course has been stable. Pertinent negatives for hypoglycemia include no dizziness or headaches. Pertinent negatives for diabetes include no chest pain, no fatigue and no weakness. Pertinent negatives for diabetic complications include no CVA.    Lab Results  Component Value Date   NA 135 12/27/2021   K 4.3 12/27/2021   CO2 25 12/27/2021   GLUCOSE 113 (H) 12/27/2021   BUN 20 12/27/2021   CREATININE 1.19 (H) 12/27/2021   CALCIUM 8.9 12/27/2021   EGFR 66 07/15/2021   GFRNONAA 53 (L) 12/27/2021   Lab Results  Component Value Date   CHOL 271 (H) 07/15/2021   HDL 44 07/15/2021   LDLCALC 203 (H) 07/15/2021   TRIG 133 07/15/2021   CHOLHDL 6.2 (H) 07/15/2021   Lab Results  Component Value Date   TSH 1.140 12/26/2021   Lab Results  Component Value Date   HGBA1C 6.3 (A) 03/18/2022   Lab Results  Component Value Date   WBC 11.1 (H) 12/27/2021   HGB 13.4 12/27/2021   HCT 41.0 12/27/2021   MCV 87.4 12/27/2021   PLT 308 12/27/2021   Lab Results  Component Value Date   ALT 12 07/15/2021   AST 16 07/15/2021   ALKPHOS 83 07/15/2021   BILITOT 0.4 07/15/2021   Lab Results  Component Value Date   VD25OH 20.0 (L) 07/01/2016      Review of Systems  Constitutional:  Negative for fatigue and unexpected weight change.  HENT:  Negative for nosebleeds.   Eyes:  Negative for visual disturbance.  Respiratory:  Negative for cough, chest tightness, shortness of breath and wheezing.   Cardiovascular:  Negative for chest pain, palpitations and leg swelling.  Gastrointestinal:  Negative for abdominal pain, constipation and diarrhea.  Neurological:  Negative for dizziness, weakness, light-headedness and headaches.    Patient Active Problem List   Diagnosis Date Noted   Acquired thrombophilia ([place]) 03/18/2022   Prediabetes 12/16/2021   CAD (coronary artery disease), native coronary artery 12/06/2021   Lymphedema of left arm 09/23/2021   Nevus of neck 09/23/2021   Blood in stool 05/13/2021   Peroneal tendinitis, left 09/30/2020   OSA on CPAP 07/12/2020   Breast cyst, right 05/08/2020   Status post hysterectomy 04/03/2020   Colon cancer screening 07/13/2019   Paroxysmal atrial fibrillation with RVR ([place]) 07/11/2019   Multiple thyroid nodules 08/23/2018   Mixed hyperlipidemia 07/06/2018   Microscopic hematuria 05/04/2018   Liver hemangioma 03/26/2017   Panic disorder 06/15/2016   Macroglossia 02/05/2016   Environmental and seasonal allergies 01/02/2016   Hot flashes related to aromatase inhibitor therapy 09/08/2015   Vitamin D deficiency 04/19/2015   Cervical radiculopathy 04/17/2015   Essential hypertension 04/17/2015   History of partial thyroidectomy 04/17/2015  Obesity, Class II, BMI 35-39.9 04/17/2015   Lipoma of axilla 10/24/2014   History of left breast cancer 09/02/2010    Allergies  Allergen Reactions   Ace Inhibitors Swelling   Sernivo [Betamethasone Dipropionate] Itching   Penicillins Rash    Has patient had a PCN reaction causing immediate rash, facial/tongue/throat swelling, SOB or lightheadedness with hypotension: Yes Has patient had a PCN reaction causing severe rash involving mucus membranes  or skin necrosis: No Has patient had a PCN reaction that required hospitalization: No Has patient had a PCN reaction occurring within the last 10 years: No If all of the above answers are "NO", then may proceed with Cephalosporin use.    Past Surgical History:  Procedure Laterality Date   ABDOMINAL HYSTERECTOMY  2011   ovaries remain   ATRIAL FIBRILLATION ABLATION N/A 01/12/2022   Procedure: ATRIAL FIBRILLATION ABLATION;  Surgeon: Vickie Epley, MD;  Location: [facility];  Service: Cardiovascular;  Laterality: N/A;   birth mark removed     BREAST CYST ASPIRATION Right    negative 01/2010   BREAST EXCISIONAL BIOPSY  08/05/2010   left breast positive 07/2010   BREAST LUMPECTOMY  2012   left breast   COLONOSCOPY  07/29/2012   normal study.   LEFT HEART CATH AND CORONARY ANGIOGRAPHY N/A 12/08/2021   Procedure: LEFT HEART CATH AND CORONARY ANGIOGRAPHY;  Surgeon: Wellington Hampshire, MD;  Location: [facility];  Service: Cardiovascular;  Laterality: N/A;   robotic tonsillectomy Lingual tonsils Bilateral 06/08/2016   THYROID SURGERY     partially removed   TUBAL LIGATION      Social History   Tobacco Use   Smoking status: Never   Smokeless tobacco: Never   Tobacco comments:    Never smoke 02/09/22  Vaping Use   Vaping Use: Never used  Substance Use Topics   Alcohol use: Not Currently    Comment: occ   Drug use: Never     Medication list has been reviewed and updated.  Current Meds  Medication Sig   apixaban (ELIQUIS) 5 MG TABS tablet Take 1 tablet (5 mg total) by mouth 2 (two) times daily.   atorvastatin (LIPITOR) 40 MG tablet TAKE 1 TABLET BY MOUTH EVERY DAY   B Complex-Biotin-FA (B-COMPLEX PO) Take 1 tablet by mouth daily.   ezetimibe (ZETIA) 10 MG tablet TAKE 1 TABLET BY MOUTH EVERY DAY   furosemide (LASIX) 20 MG tablet TAKE 1 TABLET (20 MG TOTAL) BY MOUTH DAILY. INCREASE TO 1 TABLET (20 MG TOTAL) BY MOUTH TWICE DAILY (TOTAL DAILY DOSE 40 MG) AS  NEEDED FOR UP TO 3 DAYS FOR INCREASED LEG SWELLING, SHORTNESS OF BREATH, WEIGHT GAIN 5+ LBS OVER 1-2 DAYS. SEEK MEDICAL CARE IF THESE SYMPTOMS ARE NOT IMPROVING WITH INCREASED DOSE.   levothyroxine (SYNTHROID) 75 MCG tablet Take 75 mcg by mouth daily.   LORazepam (ATIVAN) 0.5 MG tablet Take 1 tablet (0.5 mg total) by mouth every 8 (eight) hours as needed. for anxiety   metoprolol succinate (TOPROL-XL) 50 MG 24 hr tablet Take 1.5 tablets (75 mg total) by mouth 2 (two) times daily. Take with or immediately following a meal.   Multiple Vitamin (MULTIVITAMIN) tablet Take 1 tablet by mouth daily.   nitroGLYCERIN (NITROSTAT) 0.4 MG SL tablet Place 1 tablet (0.4 mg total) under the tongue every 5 (five) minutes as needed for chest pain.   potassium chloride SA (KLOR-CON M) 20 MEQ tablet Take 1 tablet (20 mEq total) by mouth  daily.   propafenone (RYTHMOL SR) 225 MG 12 hr capsule Take 225 mg by mouth 2 (two) times daily.   [DISCONTINUED] pantoprazole (PROTONIX) 40 MG tablet Take 1 tablet (40 mg total) by mouth daily.       03/18/2022    2:54 PM 12/16/2021    1:59 PM 09/23/2021    2:56 PM 07/15/2021   10:00 AM  GAD 7 : Generalized Anxiety Score  Nervous, Anxious, on Edge '1 2 1 1  '$ Control/stop worrying 0 2 0 0  Worry too much - different things '1 2 1 1  '$ Trouble relaxing 0 0 0 0  Restless 1 0 0 0  Easily annoyed or irritable 1 0 0 0  Afraid - awful might happen 0 1 0 0  Total GAD 7 Score '4 7 2 2  '$ Anxiety Difficulty Somewhat difficult Somewhat difficult Not difficult at all Not difficult at all       03/18/2022    2:54 PM 12/16/2021    1:59 PM 09/23/2021    2:56 PM  Depression screen PHQ 2/9  Decreased Interest 1 0 1  Down, Depressed, Hopeless 1 0 0  PHQ - 2 Score 2 0 1  Altered sleeping '1 2 1  '$ Tired, decreased energy '1 2 2  '$ Change in appetite 1 0 1  Feeling bad or failure about yourself  1 0 0  Trouble concentrating 1 0 2  Moving slowly or fidgety/restless 1 0 1  Suicidal thoughts 0 0 0   PHQ-9 Score '8 4 8  '$ Difficult doing work/chores Somewhat difficult Somewhat difficult Somewhat difficult    BP Readings from Last 3 Encounters:  03/18/22 128/80  02/09/22 126/82  01/12/22 127/89    Physical Exam Vitals and nursing note reviewed.  Constitutional:      General: She is not in acute distress.    Appearance: She is well-developed.  HENT:     Head: Normocephalic and atraumatic.  Cardiovascular:     Rate and Rhythm: Normal rate and regular rhythm.     Pulses: Normal pulses.  Pulmonary:     Effort: Pulmonary effort is normal. No respiratory distress.     Breath sounds: No wheezing or rhonchi.  Musculoskeletal:     Cervical back: Normal range of motion.  Lymphadenopathy:     Cervical: No cervical adenopathy.  Skin:    General: Skin is warm and dry.     Findings: No rash.  Neurological:     Mental Status: She is alert and oriented to person, place, and time.  Psychiatric:        Mood and Affect: Mood normal.        Behavior: Behavior normal.     Wt Readings from Last 3 Encounters:  03/18/22 264 lb 3.2 oz (119.8 kg)  02/09/22 265 lb (120.2 kg)  01/12/22 270 lb (122.5 kg)    BP 128/80   Pulse 67   Ht '5\' 7"'$  (1.702 m)   Wt 264 lb 3.2 oz (119.8 kg)   LMP 04/01/2008   SpO2 97%   BMI 41.38 kg/m   Assessment and Plan: Problem List Items Addressed This Visit       Cardiovascular and Mediastinum   Essential hypertension - Primary (Chronic)    Clinically stable exam with well controlled BP on metoprolol and furosemide. Tolerating medications without side effects. Recommend metoprolol 100 mg AM and 50 mg PM and move PM dose closer to suppertime Reduce sodium intake Take lasix bid for 3 days  Relevant Orders   Comprehensive metabolic panel     Hematopoietic and Hemostatic   Acquired thrombophilia ([place])    Due to DOAC therapy for Afib No bleeding issues noted Avoid excessive use of herbal supplements that may interfere with the DOAC action         Other   Mixed hyperlipidemia (Chronic)    On statin medications but has changed her diet some Would like to recheck her labs.      Relevant Orders   Comprehensive metabolic panel   Lipid panel   Prediabetes    BS controlled on diet alone Last A1C 6.1 Weight is stable but A1C up to 6.3 Continue lifestyle changes and reinforce low carb diet choices      Relevant Orders   POCT glycosylated hemoglobin (Hb A1C) (Completed)     Partially dictated using Editor, commissioning. Any errors are unintentional.  Halina Maidens, MD [facility]  03/18/2022

## 2022-03-18 NOTE — Assessment & Plan Note (Addendum)
BS controlled on diet alone Last A1C 6.1 Weight is stable but A1C up to 6.3 Continue lifestyle changes and reinforce low carb diet choices

## 2022-03-18 NOTE — Assessment & Plan Note (Signed)
Due to [place] therapy for Afib No bleeding issues noted Avoid excessive use of herbal supplements that may interfere with the DOAC action

## 2022-03-18 NOTE — Assessment & Plan Note (Signed)
On statin medications but has changed her diet some Would like to recheck her labs.

## 2022-03-19 DIAGNOSIS — I10 Essential (primary) hypertension: Secondary | ICD-10-CM | POA: Diagnosis not present

## 2022-03-19 DIAGNOSIS — E782 Mixed hyperlipidemia: Secondary | ICD-10-CM | POA: Diagnosis not present

## 2022-03-20 LAB — COMPREHENSIVE METABOLIC PANEL
ALT: 13 IU/L (ref 0–32)
AST: 11 IU/L (ref 0–40)
Albumin/Globulin Ratio: 1.6 (ref 1.2–2.2)
Albumin: 3.9 g/dL (ref 3.8–4.9)
Alkaline Phosphatase: 115 IU/L (ref 44–121)
BUN/Creatinine Ratio: 14 (ref 12–28)
BUN: 15 mg/dL (ref 8–27)
Bilirubin Total: 0.4 mg/dL (ref 0.0–1.2)
CO2: 24 mmol/L (ref 20–29)
Calcium: 9 mg/dL (ref 8.7–10.3)
Chloride: 102 mmol/L (ref 96–106)
Creatinine, Ser: 1.06 mg/dL — ABNORMAL HIGH (ref 0.57–1.00)
Globulin, Total: 2.4 g/dL (ref 1.5–4.5)
Glucose: 98 mg/dL (ref 70–99)
Potassium: 3.8 mmol/L (ref 3.5–5.2)
Sodium: 141 mmol/L (ref 134–144)
Total Protein: 6.3 g/dL (ref 6.0–8.5)
eGFR: 60 mL/min/{1.73_m2} (ref >59)

## 2022-03-20 LAB — LIPID PANEL
Chol/HDL Ratio: 3.2 ratio (ref 0.0–4.4)
Cholesterol, Total: 129 mg/dL (ref 100–199)
HDL: 40 mg/dL (ref >39)
LDL Chol Calc (NIH): 75 mg/dL (ref 0–99)
Triglycerides: 66 mg/dL (ref 0–149)
VLDL Cholesterol Cal: 14 mg/dL (ref 5–40)

## 2022-03-29 DIAGNOSIS — I480 Paroxysmal atrial fibrillation: Secondary | ICD-10-CM | POA: Insufficient documentation

## 2022-03-29 DIAGNOSIS — I4819 Other persistent atrial fibrillation: Secondary | ICD-10-CM | POA: Insufficient documentation

## 2022-03-29 NOTE — Progress Notes (Unsigned)
Cardiology Office Note  Date:  03/30/2022   ID:  [name], DOB 24-Jan-1961, MRN [id number]  PCP:  Heather Hess, MD   Chief Complaint  Patient presents with   3 month follow up     Patient c/o elevated blood pressure in the am and pm prior to taking the Metoprolol; she feels the Metoprolol dose not strong enough and stopped the atorvastatin and zetia about 1 week ago due feeling anxious. Medications reviewed by the patient verbally.     HPI:  Heather Dudley is a 62 y.o. female  HTN Nonsmoker No diabetes breast cancer on the left, lumpectomy and lymph node dissection., chemotherapy 2015 remote chest pain Dx with OSA, on CPAP,  Atherosclerosis on CT Presents for f/u of her chest pain, tachycardia, paroxysmal atrial fibrillation  Last seen by myself in clinic April, 2023 Seen by EP October 2023  Arrhythmia medication history Multaq never filled secondary to cost Propafenone makes her feel tired with only taking once a day Flecainide caused her to feel "Scattered"  underwent afib and flutter ablation 01/12/22.  Remains on metoprolol succinate 75 twice daily, propafenone SR 225 twice daily Eliquis 5 twice daily  In follow-up today she was concerned about high blood pressure and reports that she increased her metoprolol succinate up to 100 twice daily Also reports that she is taking lasix 40 daily for high blood pressure BP 140 to 150/100  Stopped lipitor and zetia, caused anxiety  Denies significant breakthrough tachyarrhythmia  Denies significant leg swelling Missing doses of potassium  Lab work reviewed A1C 6.3 Total chol 262 LDL 195  EKG personally reviewed by myself on todays visit Normal sinus rhythm rate 69 bpm no significant ST-T wave changes  Of past medical history reviewed Eye itching on amlodipine Side effects on lisinopril Side effect on flecainide, "felt scattered"  Seen in emergency room January 24, 2020 Had discoordination,  anxiety, atypical chest pain Night before had episode of atrial fibrillation  Echo April 2021 Left ventricular ejection fraction, by estimation, is 55 to 60%. The  left ventricle has normal function Left ventricular diastolic parameters  are consistent with Grade II diastolic dysfunction (pseudonormalization).  moderately elevated pulmonary artery systolic pressure.   Monitor reviewed avg HR of 79 bpm. Predominant underlying rhythm was Sinus Rhythm. Atrial Fibrillation occurred (2% burden), ranging from 73-175 bpm (avg of 115 bpm), the longest lasting 52 mins 28 secs with an avg rate of 118 bpm.  CT chest:  No significant coronary calcified atherosclerosis or aortic plaque  History of lumpectomy and lymph node dissection.  Sometimes this causes discomfort in her left arm  PMH:   has a past medical history of Basal cell carcinoma of forehead, Breast cancer ([place]), Breast screening, unspecified, Cellulitis and abscess of trunk, Diastolic dysfunction, Hernia, History of chemotherapy (2012), Hypertension, Lump or mass in breast, Malignant neoplasm of breast (female), unspecified site, Malignant neoplasm of upper-outer quadrant of female breast ([place]), Neoplasm of uncertain behavior of connective and other soft tissue, Obesity, unspecified, PAF (paroxysmal atrial fibrillation) ([place]) (05/04/2017), Personal history of chemotherapy, Personal history of malignant neoplasm of breast, Personal history of radiation therapy (2013), Screening for obesity, and Thyroid nodule.  PSH:    Past Surgical History:  Procedure Laterality Date   ABDOMINAL HYSTERECTOMY  2011   ovaries remain   ATRIAL FIBRILLATION ABLATION N/A 01/12/2022   Procedure: ATRIAL FIBRILLATION ABLATION;  Surgeon: Heather Epley, MD;  Location: [facility];  Service: Cardiovascular;  Laterality: N/A;  birth mark removed     BREAST CYST ASPIRATION Right    negative 01/2010   BREAST EXCISIONAL BIOPSY  08/05/2010   left breast  positive 07/2010   BREAST LUMPECTOMY  2012   left breast   COLONOSCOPY  07/29/2012   normal study.   LEFT HEART CATH AND CORONARY ANGIOGRAPHY N/A 12/08/2021   Procedure: LEFT HEART CATH AND CORONARY ANGIOGRAPHY;  Surgeon: Heather Hampshire, MD;  Location: [facility];  Service: Cardiovascular;  Laterality: N/A;   robotic tonsillectomy Lingual tonsils Bilateral 06/08/2016   THYROID SURGERY     partially removed   TUBAL LIGATION      Current Outpatient Medications  Medication Sig Dispense Refill   apixaban (ELIQUIS) 5 MG TABS tablet Take 1 tablet (5 mg total) by mouth 2 (two) times daily. 60 tablet 5   B Complex-Biotin-FA (B-COMPLEX PO) Take 1 tablet by mouth daily.     colchicine 0.6 MG tablet Take 1 tablet (0.6 mg total) by mouth 2 (two) times daily for 5 days. 10 tablet 0   furosemide (LASIX) 20 MG tablet TAKE 1 TABLET (20 MG TOTAL) BY MOUTH DAILY. INCREASE TO 1 TABLET (20 MG TOTAL) BY MOUTH TWICE DAILY (TOTAL DAILY DOSE 40 MG) AS NEEDED FOR UP TO 3 DAYS FOR INCREASED LEG SWELLING, SHORTNESS OF BREATH, WEIGHT GAIN 5+ LBS OVER 1-2 DAYS. SEEK MEDICAL CARE IF THESE SYMPTOMS ARE NOT IMPROVING WITH INCREASED DOSE. 135 tablet 1   levothyroxine (SYNTHROID) 75 MCG tablet Take 75 mcg by mouth daily.     LORazepam (ATIVAN) 0.5 MG tablet Take 1 tablet (0.5 mg total) by mouth every 8 (eight) hours as needed. for anxiety 20 tablet 0   metoprolol succinate (TOPROL-XL) 50 MG 24 hr tablet Take 100 mg by mouth in the morning and at bedtime. Take with or immediately following a meal.     Multiple Vitamin (MULTIVITAMIN) tablet Take 1 tablet by mouth daily.     nitroGLYCERIN (NITROSTAT) 0.4 MG SL tablet Place 1 tablet (0.4 mg total) under the tongue every 5 (five) minutes as needed for chest pain. 30 tablet 0   potassium chloride SA (KLOR-CON M) 20 MEQ tablet Take 1 tablet (20 mEq total) by mouth daily. 90 tablet 3   propafenone (RYTHMOL SR) 225 MG 12 hr capsule Take 225 mg by mouth 2 (two) times  daily.     No current facility-administered medications for this visit.     Allergies:   Ace inhibitors, Sernivo [betamethasone dipropionate], and Penicillins   Social History:  The patient  reports that she has never smoked. She has never used smokeless tobacco. She reports that she does not currently use alcohol. She reports that she does not use drugs.   Family History:   family history includes Cancer in her cousin, maternal uncle, mother, and paternal aunt; Colon cancer in an other family member; Heart failure in her father; Ovarian cancer in an other family member.    Review of Systems: Review of Systems  Constitutional: Negative.   HENT: Negative.    Respiratory: Negative.    Cardiovascular: Negative.   Gastrointestinal: Negative.   Musculoskeletal: Negative.   Neurological: Negative.   Psychiatric/Behavioral: Negative.    All other systems reviewed and are negative.   PHYSICAL EXAM: VS:  BP (!) 140/90 (BP Location: Right Arm, Patient Position: Sitting, Cuff Size: Large)   Pulse 69   Ht '5\' 7"'$  (1.702 m)   Wt 260 lb 8 oz (118.2 kg)   LMP  04/01/2008   SpO2 96%   BMI 40.80 kg/m  , BMI Body mass index is 40.8 kg/m. Constitutional:  oriented to person, place, and time. No distress.  HENT:  Head: Grossly normal Eyes:  no discharge. No scleral icterus.  Neck: No JVD, no carotid bruits  Cardiovascular: Regular rate and rhythm, no murmurs appreciated Pulmonary/Chest: Clear to auscultation bilaterally, no wheezes or rails Abdominal: Soft.  no distension.  no tenderness.  Musculoskeletal: Normal range of motion Neurological:  normal muscle tone. Coordination normal. No atrophy Skin: Skin warm and dry Psychiatric: normal affect, pleasant  Recent Labs: 12/06/2021: Magnesium 1.9 12/26/2021: TSH 1.140 12/27/2021: Hemoglobin 13.4; Platelets 308 03/19/2022: ALT 13; BUN 15; Creatinine, Ser 1.06; Potassium 3.8; Sodium 141    Lipid Panel Lab Results  Component Value Date    CHOL 129 03/19/2022   HDL 40 03/19/2022   LDLCALC 75 03/19/2022   TRIG 66 03/19/2022     Wt Readings from Last 3 Encounters:  03/30/22 260 lb 8 oz (118.2 kg)  03/18/22 264 lb 3.2 oz (119.8 kg)  02/09/22 265 lb (120.2 kg)     ASSESSMENT AND PLAN:  Paroxysmal atrial fibrillation Recent ablation, reports doing well, maintaining normal sinus rhythm Recommend she continue metoprolol succinate 75 twice daily, Eliquis  Chest pain, unspecified type - CT scan chest reviewed no coronary calcification, no aortic atherosclerosis CT scan 2020 reviewed  no significant coronary calcification or aortic atherosclerosis No further workup needed at this time  Hyperlipidemia, mixed Overwhelmed by Lipitor 40 with Zetia despite good numbers, felt anxious She is willing to try Lipitor 10, plus minus Zetia  Essential hypertension -  Blood pressure running high, Will avoid ARB given prior history angioedema on ACE inhibitor's Avoid calcium channel blockers given prior leg swelling Avoid HCTZ as she is on Lasix Recommend she start Cardura 1 mg twice daily, this can be titrated up as needed for blood pressure control  Malignant neoplasm of left breast in female, estrogen receptor positive, unspecified site of breast ([place]) Previous chemotherapy and radiation on the left Lymphedema left arm Normal ejection fraction 2021 discussed   Total encounter time more than 30 minutes  Greater than 50% was spent in counseling and coordination of care with the patient   No orders of the defined types were placed in this encounter.    Signed, Esmond Plants, M.D., Ph.D. 03/30/2022  [place], [place]

## 2022-03-30 ENCOUNTER — Ambulatory Visit: Payer: BC Managed Care – PPO | Attending: Cardiovascular Disease | Admitting: Cardiovascular Disease

## 2022-03-30 ENCOUNTER — Encounter: Payer: Self-pay | Admitting: Cardiovascular Disease

## 2022-03-30 ENCOUNTER — Telehealth: Payer: Self-pay | Admitting: *Deleted

## 2022-03-30 VITALS — BP 140/90 | HR 69 | Ht 67.0 in | Wt 260.5 lb

## 2022-03-30 DIAGNOSIS — I10 Essential (primary) hypertension: Secondary | ICD-10-CM | POA: Diagnosis not present

## 2022-03-30 DIAGNOSIS — G4733 Obstructive sleep apnea (adult) (pediatric): Secondary | ICD-10-CM

## 2022-03-30 DIAGNOSIS — I5032 Chronic diastolic (congestive) heart failure: Secondary | ICD-10-CM | POA: Diagnosis not present

## 2022-03-30 DIAGNOSIS — I4819 Other persistent atrial fibrillation: Secondary | ICD-10-CM

## 2022-03-30 DIAGNOSIS — R7303 Prediabetes: Secondary | ICD-10-CM

## 2022-03-30 DIAGNOSIS — E782 Mixed hyperlipidemia: Secondary | ICD-10-CM

## 2022-03-30 DIAGNOSIS — I25118 Atherosclerotic heart disease of native coronary artery with other forms of angina pectoris: Secondary | ICD-10-CM | POA: Diagnosis not present

## 2022-03-30 MED ORDER — DOXAZOSIN MESYLATE 1 MG PO TABS: 1.0000 mg | ORAL_TABLET | Freq: Two times a day (BID) | ORAL | 1 refills | Status: DC

## 2022-03-30 MED ORDER — EZETIMIBE 10 MG PO TABS: 10.0000 mg | ORAL_TABLET | Freq: Every day | ORAL | 1 refills | Status: DC

## 2022-03-30 MED ORDER — METOPROLOL SUCCINATE ER 50 MG PO TB24: 75.0000 mg | ORAL_TABLET | Freq: Two times a day (BID) | ORAL | 3 refills | Status: DC

## 2022-03-30 MED ORDER — ATORVASTATIN CALCIUM 10 MG PO TABS: 10.0000 mg | ORAL_TABLET | Freq: Every day | ORAL | 3 refills | Status: DC

## 2022-03-30 NOTE — Patient Instructions (Addendum)
Medication Instructions:  START  Cardura/doxazosin 1 mg twice a day  RESTART the Metoprolol succinate 75 twice a day (one and a half pills)  RESTART the Atorvastatin (Lipitor) 10 mg daily RESTART the Zetia 10 mg daily or every other day when you are ready  If you need a refill on your cardiac medications before your next appointment, please call your pharmacy.   Lab work: No new labs needed  Testing/Procedures: No new testing needed  Follow-Up: At [facility], you and your health needs are our priority.  As part of our continuing mission to provide you with exceptional heart care, we have created designated Provider Care Teams.  These Care Teams include your primary Cardiologist (physician) and Advanced Practice Providers (APPs -  Physician Assistants and Nurse Practitioners) who all work together to provide you with the care you need, when you need it.  You will need a follow up appointment in 6 months  Providers on your designated Care Team:   Murray Hodgkins, NP Christell Faith, PA-C Cadence Kathlen Mody, [place]  COVID-19 Vaccine Information can be found at: [email or web address] For questions related to vaccine distribution or appointments, please email [email or web address] or call [phone].   Dr. Rockey Situ would like you to check your blood pressure daily.  Keep a journal of these daily blood pressure and heart rate readings. Thank you!  It is best to check your BP 1-2 hours after taking your medications to see the medications effectiveness on your BP.    Here are some tips that our clinical pharmacists share for home BP monitoring:          Rest 10 minutes before taking your blood pressure.          Don't smoke or drink caffeinated beverages for at least 30 minutes before.          Take your blood pressure before (not after) you eat.          Sit comfortably with your back supported and both feet on the floor (don't cross  your legs).          Elevate your arm to heart level on a table or a desk.          Use the proper sized cuff. It should fit smoothly and snugly around your bare upper arm. There should be enough room to slip a fingertip under the cuff. The bottom edge of the cuff should be 1 inch above the crease of the elbow.

## 2022-03-30 NOTE — Telephone Encounter (Signed)
Patient dropped off her forms and copies. She stated that some of the information filled out on the previous ones was incorrect and would like for it to be updated. Forms have been placed in the providers box for review.

## 2022-04-01 NOTE — Telephone Encounter (Signed)
Left message for patient to call back.  Forms were filled out for her ablation with Dr. Quentin Ore. She left notes on the paperwork stating that the dates were incorrect and they needed to be changed to dates of hospitalization in October (10/13-10/17). Need clarification on what these forms are for.  Forms have been left in Dr. Claudie Revering box until we can get clarification from patient to determine if they need to be filled out by Dr. Rockey Situ.

## 2022-04-08 DIAGNOSIS — G4733 Obstructive sleep apnea (adult) (pediatric): Secondary | ICD-10-CM | POA: Diagnosis not present

## 2022-04-08 DIAGNOSIS — I10 Essential (primary) hypertension: Secondary | ICD-10-CM | POA: Diagnosis not present

## 2022-04-14 NOTE — Progress Notes (Deleted)
  Electrophysiology Office Follow up Visit Note:    Date:  04/14/2022   ID:  Heather Dudley, DOB 24-Dec-1960, MRN [id number]  PCP:  Glean Hess, MD  [facility] Cardiologist:  Ida Rogue, MD  [facility] Electrophysiologist:  Vickie Epley, MD    Interval History:    Heather Dudley is a 61 y.o. female who presents for a follow up visit.  She had an A-fib ablation January 12, 2022.  During the procedure, the veins, posterior wall and CTI were ablated.  She saw Audry Pili in the A-fib clinic February 09, 2022.  At that appointment she reported improving atrial fibrillation burden and improved energy levels.  She saw Dr. Rockey Situ March 30, 2022.      Past medical, surgical, social and family history were reviewed.  Current Medications: No outpatient medications have been marked as taking for the 04/15/22 encounter (Appointment) with Vickie Epley, MD.     Allergies:   Ace inhibitors, Lonzo Cloud dipropionate], and Penicillins   ROS:   Please see the history of present illness.    All other systems reviewed and are negative.  EKGs/Labs/Other Studies Reviewed:    The following studies were reviewed today:    EKG:  The ekg ordered today demonstrates ***   Physical Exam:    VS:  LMP 04/01/2008     Wt Readings from Last 3 Encounters:  03/30/22 260 lb 8 oz (118.2 kg)  03/18/22 264 lb 3.2 oz (119.8 kg)  02/09/22 265 lb (120.2 kg)     GEN: *** Well nourished, well developed in no acute distress CARDIAC: ***RRR, no murmurs, rubs, gallops RESPIRATORY:  Clear to auscultation without rales, wheezing or rhonchi       ASSESSMENT:    1. Persistent atrial fibrillation ([place])   2. Chronic diastolic heart failure ([place])   3. Primary hypertension    PLAN:    In order of problems listed above:   #Persistent atrial fibrillation and flutter #High risk medication use-propafenone  Continue Eliquis for stroke prophylaxis Continue  propafenone to 25 mg by mouth twice daily  #Chronic diastolic heart failure NYHA class II.  Warm and dry on exam.  #Hypertension *** goal today.  Recommend checking blood pressures 1-2 times per week at home and recording the values.  Recommend bringing these recordings to the primary care physician.   Follow-up 6 months with APP.   Medication Adjustments/Labs and Tests Ordered: Current medicines are reviewed at length with the patient today.  Concerns regarding medicines are outlined above.  No orders of the defined types were placed in this encounter.  No orders of the defined types were placed in this encounter.    Signed, Lars Mage, MD, [facility], [facility] 04/14/2022 9:07 PM    Electrophysiology [facility]

## 2022-04-15 ENCOUNTER — Ambulatory Visit: Payer: BC Managed Care – PPO | Admitting: Cardiology

## 2022-04-15 DIAGNOSIS — I4819 Other persistent atrial fibrillation: Secondary | ICD-10-CM

## 2022-04-15 DIAGNOSIS — Z79899 Other long term (current) drug therapy: Secondary | ICD-10-CM

## 2022-04-15 DIAGNOSIS — I10 Essential (primary) hypertension: Secondary | ICD-10-CM

## 2022-04-15 DIAGNOSIS — I5032 Chronic diastolic (congestive) heart failure: Secondary | ICD-10-CM

## 2022-04-15 NOTE — Progress Notes (Incomplete)
  Electrophysiology Office Follow up Visit Note:    Date:  04/15/2022   ID:  Heather Dudley, DOB [birth year], MRN [id number]  PCP:  Glean Hess, MD  [facility] Cardiologist:  Ida Rogue, MD  [facility] Electrophysiologist:  Vickie Epley, MD    Interval History:    Heather Dudley is a 62 y.o. female who presents for a follow up visit.  She had an A-fib ablation January 12, 2022.  During the procedure, the veins, posterior wall and CTI were ablated.  She saw Audry Pili in the A-fib clinic February 09, 2022.  At that appointment she reported improving atrial fibrillation burden and improved energy levels.  She saw Dr. Rockey Situ March 30, 2022.   Today,  She denies any chest pain, shortness of breath, or peripheral edema. No lightheadedness, headaches, syncope, orthopnea, or PND.  Past medical, surgical, social and family history were reviewed.  (+)  ***-  Current Medications: No outpatient medications have been marked as taking for the 04/15/22 encounter (Appointment) with Vickie Epley, MD.     Allergies:   Ace inhibitors, Lonzo Cloud dipropionate], and Penicillins   ROS:   Please see the history of present illness.     All other systems reviewed and are negative.  EKGs/Labs/Other Studies Reviewed:    The following studies were reviewed today:    EKG:  EKG is personally reviewed. 04/15/2022: Sinus ***. Rate *** bpm.    Physical Exam:    VS:  LMP 04/01/2008     Wt Readings from Last 3 Encounters:  03/30/22 260 lb 8 oz (118.2 kg)  03/18/22 264 lb 3.2 oz (119.8 kg)  02/09/22 265 lb (120.2 kg)     GEN:  Well nourished, well developed in no acute distress CARDIAC: RRR, no murmurs, rubs, gallops RESPIRATORY:  Clear to auscultation without rales, wheezing or rhonchi       ASSESSMENT:    1. Persistent atrial fibrillation ([place])   2. Chronic diastolic heart failure ([place])   3. Primary hypertension   4. Encounter for  long-term (current) use of high-risk medication    PLAN:    In order of problems listed above:   #Persistent atrial fibrillation and flutter #High risk medication use-propafenone  Continue Eliquis for stroke prophylaxis Continue propafenone to 25 mg by mouth twice daily  #Chronic diastolic heart failure NYHA class II.  Warm and dry on exam.  #Hypertension *** goal today.  Recommend checking blood pressures 1-2 times per week at home and recording the values.  Recommend bringing these recordings to the primary care physician.   Follow-up: ***   Medication Adjustments/Labs and Tests Ordered: Current medicines are reviewed at length with the patient today.  Concerns regarding medicines are outlined above.  No orders of the defined types were placed in this encounter.  No orders of the defined types were placed in this encounter.   I,Mitra Faeizi,acting as a Education administrator for Vickie Epley, MD.,have documented all relevant documentation on the behalf of Vickie Epley, MD,as directed by  Vickie Epley, MD while in the presence of Vickie Epley, MD.  ***  Signed, Lars Mage, MD, [facility], [facility] 04/15/2022 12:33 PM    Electrophysiology [place]

## 2022-04-20 ENCOUNTER — Telehealth: Payer: Self-pay | Admitting: Cardiovascular Disease

## 2022-04-20 NOTE — Telephone Encounter (Signed)
   Pre-operative Risk Assessment    Patient Name: Heather Dudley  DOB: 1961-02-10 MRN: [id number]      Request for Surgical Clearance    Procedure:   Cleaning/deep cleaning   Date of Surgery:  Clearance 04/27/22                                 Surgeon:  Dr. Oletta Lamas  Surgeon's Group or Practice Name:  [facility] Phone number:  [phone] Fax number:  [phone]   Type of Clearance Requested:   - Medical  - Pharmacy:  Hold        Type of Anesthesia:  Not Indicated   Additional requests/questions:      SignedMilbert Coulter   04/20/2022, 11:47 AM

## 2022-04-20 NOTE — Telephone Encounter (Signed)
Patient states that she needs forms filled out to verify that she was hospitalized in October when she had a heart cath done. She states that she also needs them filled out to confirm her cardiac diagnosis. Advised that we would look at forms again and correct as needed.

## 2022-04-20 NOTE — Telephone Encounter (Signed)
   Patient Name: Heather Dudley  DOB: 03-Jan-1961 MRN: [id number]  Primary Cardiologist: Ida Rogue, MD  Chart reviewed as part of pre-operative protocol coverage. Simple dental extractions (i.e. 1-2 teeth) and cleanings are considered low risk procedures per guidelines and generally do not require any specific cardiac clearance. It is also generally accepted that for simple extractions and dental cleanings, there is no need to interrupt blood thinner therapy.  SBE prophylaxis is not required for the patient from a cardiac standpoint.  I will route this recommendation to the requesting party via Epic fax function and remove from pre-op pool.  Please call with questions.  Darreld Mclean, PA-C 04/20/2022, 1:12 PM

## 2022-04-20 NOTE — Telephone Encounter (Signed)
Patient is calling back to get update on forms sent in.

## 2022-04-22 NOTE — Telephone Encounter (Signed)
Patient's forms have been filled out for hospitalization period from 10/13-10/17. Dr. Rockey Situ will need to sign.

## 2022-04-27 ENCOUNTER — Telehealth: Payer: Self-pay | Admitting: Internal Medicine

## 2022-04-27 NOTE — Telephone Encounter (Signed)
Referral Request - Has patient seen PCP for this complaint? no *If NO, is insurance requiring patient see PCP for this issue before PCP can refer them? Referral for which specialty: endocrinologist Preferred provider/office: ? Reason for referral: prediabetic/wantshelp with weight loss

## 2022-04-27 NOTE — Telephone Encounter (Signed)
Please review.  KP

## 2022-04-29 ENCOUNTER — Other Ambulatory Visit: Payer: Self-pay | Admitting: Cardiovascular Disease

## 2022-04-30 NOTE — Telephone Encounter (Signed)
Good Morning,  Please advise if ok to refill this medication. It is currently under historical provider and if ok to refill, should this medication be refilled under Dr. Rockey Situ or Dr. Quentin Ore as the prescribing physician? Thank you so much.

## 2022-05-05 ENCOUNTER — Ambulatory Visit: Payer: BC Managed Care – PPO | Admitting: Internal Medicine

## 2022-05-05 ENCOUNTER — Encounter: Payer: Self-pay | Admitting: Internal Medicine

## 2022-05-05 VITALS — BP 128/74 | HR 78 | Ht 67.0 in | Wt 268.0 lb

## 2022-05-05 DIAGNOSIS — I4819 Other persistent atrial fibrillation: Secondary | ICD-10-CM | POA: Diagnosis not present

## 2022-05-05 DIAGNOSIS — E669 Obesity, unspecified: Secondary | ICD-10-CM

## 2022-05-05 DIAGNOSIS — F411 Generalized anxiety disorder: Secondary | ICD-10-CM

## 2022-05-05 DIAGNOSIS — E782 Mixed hyperlipidemia: Secondary | ICD-10-CM | POA: Diagnosis not present

## 2022-05-05 DIAGNOSIS — E66812 Obesity, class 2: Secondary | ICD-10-CM

## 2022-05-05 MED ORDER — LORAZEPAM 0.5 MG PO TABS: 0.5000 mg | ORAL_TABLET | Freq: Three times a day (TID) | ORAL | 0 refills | Status: DC | PRN

## 2022-05-05 NOTE — Assessment & Plan Note (Addendum)
She feels that her Afib worsened with Zetia 10 mg. She wants to try a lower dose - can break in half for 5 mg. Continue lower dose atorvastatin 10 mg. Will check labs at visit in May

## 2022-05-05 NOTE — Assessment & Plan Note (Signed)
Afib seems worse at night - she has tried to adjust her medications Continue to see Cardiology

## 2022-05-05 NOTE — Progress Notes (Signed)
Date:  05/05/2022   Name:  Heather Dudley   DOB:  [birth year]   MRN:  [id number]   Chief Complaint: Weight Check (Has gained 4 pounds since last visit, pt wants to talk about her options, pt has not took zetia for 3 weeks due to it messing with her afib)  Hyperlipidemia This is a chronic problem. The problem is controlled. Pertinent negatives include no chest pain or shortness of breath. Current antihyperlipidemic treatment includes statins and ezetimibe (atorva 10 mg, zetia 10 mg). The current treatment provides significant improvement of lipids. Compliance problems: may be having more afib with zetia.    Weight gain - she feels that she has gained weight on metoprolol. She follows a fairly healthy diet but does consume salty snacks.  She is not a candidate for lower cost medications such as phentermine.  Lab Results  Component Value Date   NA 141 03/19/2022   K 3.8 03/19/2022   CO2 24 03/19/2022   GLUCOSE 98 03/19/2022   BUN 15 03/19/2022   CREATININE 1.06 (H) 03/19/2022   CALCIUM 9.0 03/19/2022   EGFR 60 03/19/2022   GFRNONAA 53 (L) 12/27/2021   Lab Results  Component Value Date   CHOL 129 03/19/2022   HDL 40 03/19/2022   LDLCALC 75 03/19/2022   TRIG 66 03/19/2022   CHOLHDL 3.2 03/19/2022   Lab Results  Component Value Date   TSH 1.140 12/26/2021   Lab Results  Component Value Date   HGBA1C 6.3 (A) 03/18/2022   Lab Results  Component Value Date   WBC 11.1 (H) 12/27/2021   HGB 13.4 12/27/2021   HCT 41.0 12/27/2021   MCV 87.4 12/27/2021   PLT 308 12/27/2021   Lab Results  Component Value Date   ALT 13 03/19/2022   AST 11 03/19/2022   ALKPHOS 115 03/19/2022   BILITOT 0.4 03/19/2022   Lab Results  Component Value Date   VD25OH 20.0 (L) 07/01/2016     Review of Systems  Constitutional:  Positive for appetite change and unexpected weight change. Negative for chills, fatigue and fever.  Respiratory:  Negative for cough, chest tightness and shortness  of breath.   Cardiovascular:  Positive for palpitations (afib episodes). Negative for chest pain.  Neurological:  Negative for dizziness, light-headedness and headaches.  Psychiatric/Behavioral:  Negative for sleep disturbance. The patient is nervous/anxious (mother in the hospital).     Patient Active Problem List   Diagnosis Date Noted   Generalized anxiety disorder 05/05/2022   Persistent atrial fibrillation ([place]) 03/29/2022   Acquired thrombophilia ([place]) 03/18/2022   Secondary and unspecified malignant neoplasm of lymph node, unspecified ([place]) 03/18/2022   Chronic diastolic heart failure ([place]) 03/18/2022   Prediabetes 12/16/2021   CAD (coronary artery disease), native coronary artery 12/06/2021   Lymphedema of left arm 09/23/2021   Nevus of neck 09/23/2021   Blood in stool 05/13/2021   Peroneal tendinitis, left 09/30/2020   OSA on CPAP 07/12/2020   Breast cyst, right 05/08/2020   Status post hysterectomy 04/03/2020   Colon cancer screening 07/13/2019   Multiple thyroid nodules 08/23/2018   Mixed hyperlipidemia 07/06/2018   Microscopic hematuria 05/04/2018   Liver hemangioma 03/26/2017   Panic disorder 06/15/2016   Macroglossia 02/05/2016   Environmental and seasonal allergies 01/02/2016   Hot flashes related to aromatase inhibitor therapy 09/08/2015   Vitamin D deficiency 04/19/2015   Cervical radiculopathy 04/17/2015   Essential hypertension 04/17/2015   History of partial thyroidectomy 04/17/2015   Obesity,  Class II, BMI 35-39.9 04/17/2015   Lipoma of axilla 10/24/2014   History of left breast cancer 09/02/2010    Allergies  Allergen Reactions   Ace Inhibitors Swelling   Sernivo [Betamethasone Dipropionate] Itching   Penicillins Rash    Has patient had a PCN reaction causing immediate rash, facial/tongue/throat swelling, SOB or lightheadedness with hypotension: Yes Has patient had a PCN reaction causing severe rash involving mucus membranes or skin necrosis:  No Has patient had a PCN reaction that required hospitalization: No Has patient had a PCN reaction occurring within the last 10 years: No If all of the above answers are "NO", then may proceed with Cephalosporin use.    Past Surgical History:  Procedure Laterality Date   ABDOMINAL HYSTERECTOMY  2011   ovaries remain   ATRIAL FIBRILLATION ABLATION N/A 01/12/2022   Procedure: ATRIAL FIBRILLATION ABLATION;  Surgeon: Vickie Epley, MD;  Location: [place] [facility];  Service: Cardiovascular;  Laterality: N/A;   birth mark removed     BREAST CYST ASPIRATION Right    negative 01/2010   BREAST EXCISIONAL BIOPSY  08/05/2010   left breast positive 07/2010   BREAST LUMPECTOMY  2012   left breast   COLONOSCOPY  07/29/2012   normal study.   LEFT HEART CATH AND CORONARY ANGIOGRAPHY N/A 12/08/2021   Procedure: LEFT HEART CATH AND CORONARY ANGIOGRAPHY;  Surgeon: Wellington Hampshire, MD;  Location: [place];  Service: Cardiovascular;  Laterality: N/A;   robotic tonsillectomy Lingual tonsils Bilateral 06/08/2016   THYROID SURGERY     partially removed   TUBAL LIGATION      Social History   Tobacco Use   Smoking status: Never   Smokeless tobacco: Never   Tobacco comments:    Never smoke 02/09/22  Vaping Use   Vaping Use: Never used  Substance Use Topics   Alcohol use: Not Currently    Comment: occ   Drug use: Never     Medication list has been reviewed and updated.  Current Meds  Medication Sig   apixaban (ELIQUIS) 5 MG TABS tablet Take 1 tablet (5 mg total) by mouth 2 (two) times daily.   atorvastatin (LIPITOR) 10 MG tablet Take 1 tablet (10 mg total) by mouth daily.   B Complex-Biotin-FA (B-COMPLEX PO) Take 1 tablet by mouth daily.   doxazosin (CARDURA) 1 MG tablet Take 1 tablet (1 mg total) by mouth 2 (two) times daily. (Patient taking differently: Take 1 mg by mouth 2 (two) times daily.)   furosemide (LASIX) 20 MG tablet TAKE 1 TABLET (20 MG TOTAL) BY MOUTH DAILY.  INCREASE TO 1 TABLET (20 MG TOTAL) BY MOUTH TWICE DAILY (TOTAL DAILY DOSE 40 MG) AS NEEDED FOR UP TO 3 DAYS FOR INCREASED LEG SWELLING, SHORTNESS OF BREATH, WEIGHT GAIN 5+ LBS OVER 1-2 DAYS. SEEK MEDICAL CARE IF THESE SYMPTOMS ARE NOT IMPROVING WITH INCREASED DOSE.   levothyroxine (SYNTHROID) 75 MCG tablet Take 75 mcg by mouth daily.   metoprolol succinate (TOPROL-XL) 50 MG 24 hr tablet Take 1.5 tablets (75 mg total) by mouth 2 (two) times daily. Take with or immediately following a meal.   nitroGLYCERIN (NITROSTAT) 0.4 MG SL tablet Place 1 tablet (0.4 mg total) under the tongue every 5 (five) minutes as needed for chest pain.   potassium chloride SA (KLOR-CON M) 20 MEQ tablet Take 1 tablet (20 mEq total) by mouth daily.   propafenone (RYTHMOL SR) 225 MG 12 hr capsule TAKE 1 CAPSULE BY MOUTH 2  TIMES DAILY.   [DISCONTINUED] LORazepam (ATIVAN) 0.5 MG tablet Take 1 tablet (0.5 mg total) by mouth every 8 (eight) hours as needed. for anxiety   [DISCONTINUED] Multiple Vitamin (MULTIVITAMIN) tablet Take 1 tablet by mouth daily.       05/05/2022    1:44 PM 03/18/2022    2:54 PM 12/16/2021    1:59 PM 09/23/2021    2:56 PM  GAD 7 : Generalized Anxiety Score  Nervous, Anxious, on Edge '2 1 2 1  '$ Control/stop worrying 2 0 2 0  Worry too much - different things '2 1 2 1  '$ Trouble relaxing 1 0 0 0  Restless 0 1 0 0  Easily annoyed or irritable 1 1 0 0  Afraid - awful might happen 1 0 1 0  Total GAD 7 Score '9 4 7 2  '$ Anxiety Difficulty Somewhat difficult Somewhat difficult Somewhat difficult Not difficult at all       05/05/2022    1:43 PM 03/18/2022    2:54 PM 12/16/2021    1:59 PM  Depression screen PHQ 2/9  Decreased Interest 1 1 0  Down, Depressed, Hopeless 2 1 0  PHQ - 2 Score 3 2 0  Altered sleeping '3 1 2  '$ Tired, decreased energy '2 1 2  '$ Change in appetite 2 1 0  Feeling bad or failure about yourself  2 1 0  Trouble concentrating 1 1 0  Moving slowly or fidgety/restless 2 1 0  Suicidal thoughts  0 0 0  PHQ-9 Score '15 8 4  '$ Difficult doing work/chores Somewhat difficult Somewhat difficult Somewhat difficult    BP Readings from Last 3 Encounters:  05/05/22 128/74  03/30/22 (!) 140/90  03/18/22 128/80    Physical Exam Vitals and nursing note reviewed.  Constitutional:      General: She is not in acute distress.    Appearance: She is well-developed. She is obese.  HENT:     Head: Normocephalic and atraumatic.  Cardiovascular:     Rate and Rhythm: Normal rate and regular rhythm.  Pulmonary:     Effort: Pulmonary effort is normal. No respiratory distress.     Breath sounds: No wheezing or rhonchi.  Musculoskeletal:     Cervical back: Normal range of motion.     Right lower leg: No edema.     Left lower leg: No edema.  Skin:    General: Skin is warm and dry.     Findings: No rash.  Neurological:     Mental Status: She is alert and oriented to person, place, and time.  Psychiatric:        Mood and Affect: Mood normal.        Behavior: Behavior normal.     Wt Readings from Last 3 Encounters:  05/05/22 268 lb (121.6 kg)  03/30/22 260 lb 8 oz (118.2 kg)  03/18/22 264 lb 3.2 oz (119.8 kg)    BP 128/74   Pulse 78   Ht '5\' 7"'$  (1.702 m)   Wt 268 lb (121.6 kg)   LMP 04/01/2008   SpO2 98%   BMI 41.97 kg/m   Assessment and Plan:  Problem List Items Addressed This Visit       Cardiovascular and Mediastinum   Persistent atrial fibrillation (HCC)    Afib seems worse at night - she has tried to adjust her medications Continue to see Cardiology        Other   Generalized anxiety disorder   Relevant Medications  LORazepam (ATIVAN) 0.5 MG tablet   Obesity, Class II, BMI 35-39.9    Refer to [facility]      Relevant Orders   Amb Ref to Medical Weight Management   Mixed hyperlipidemia - Primary (Chronic)    She feels that her Afib worsened with Zetia 10 mg. She wants to try a lower dose - can break in half for 5 mg. Continue lower dose atorvastatin 10 mg. Will  check labs at visit in May       No follow-ups on file.   Partially dictated using [place], any errors are not intentional.  Glean Hess, MD [place], [place]

## 2022-05-05 NOTE — Assessment & Plan Note (Signed)
Refer to [facility]

## 2022-05-06 DIAGNOSIS — I10 Essential (primary) hypertension: Secondary | ICD-10-CM | POA: Diagnosis not present

## 2022-05-06 DIAGNOSIS — G4733 Obstructive sleep apnea (adult) (pediatric): Secondary | ICD-10-CM | POA: Diagnosis not present

## 2022-05-07 DIAGNOSIS — I10 Essential (primary) hypertension: Secondary | ICD-10-CM | POA: Diagnosis not present

## 2022-05-07 DIAGNOSIS — G4733 Obstructive sleep apnea (adult) (pediatric): Secondary | ICD-10-CM | POA: Diagnosis not present

## 2022-05-17 NOTE — Progress Notes (Unsigned)
  Electrophysiology Office Follow up Visit Note:    Date:  05/20/2022   ID:  Heather Dudley, DOB [birth year], MRN [id number]  PCP:  Glean Hess, MD  [facility] Cardiologist:  Ida Rogue, MD  [facility] Electrophysiologist:  Vickie Epley, MD    Interval History:    Heather Dudley is a 62 y.o. female who presents for a follow up visit.   She had an A-fib ablation January 12, 2022.  During the ablation, the veins, posterior wall and CTI were ablated.  She saw Audry Pili in the A-fib clinic on February 09, 2022.  At that appointment her atrial fibrillation was improving with significant reduction in frequency.  She was on Eliquis for stroke prophylaxis.  She took propafenone to 25 mg by mouth twice daily for rhythm control.  She tells me that she feels much better than prior to the ablation.  Before the ablation she would have nightly prolonged episodes of rapid heartbeats and atrial fibrillation.  Now she will feel palpitations in the evening time but they are not daily and are positional.  She has a slight irregularity that she notices but she does report that these episodes are different than her previous A-fib episodes.     Past medical, surgical, social and family history were reviewed.  ROS:   Please see the history of present illness.    All other systems reviewed and are negative.  EKGs/Labs/Other Studies Reviewed:    The following studies were reviewed today:    EKG:  The ekg ordered today demonstrates sinus rhythm with frequent PVCs.   Physical Exam:    VS:  BP 132/80   Pulse 64   Ht 5\' 7"  (1.702 m)   Wt 263 lb (119.3 kg)   LMP 04/01/2008   SpO2 98%   BMI 41.19 kg/m     Wt Readings from Last 3 Encounters:  05/20/22 263 lb (119.3 kg)  05/05/22 268 lb (121.6 kg)  03/30/22 260 lb 8 oz (118.2 kg)     GEN:  Well nourished, well developed in no acute distress CARDIAC: Irregular rhythm, no murmurs, rubs, gallops RESPIRATORY:  Clear to  auscultation without rales, wheezing or rhonchi       ASSESSMENT:    1. Persistent atrial fibrillation ([place])   2. Typical atrial flutter ([place])   3. Coronary artery disease involving native coronary artery of native heart without angina pectoris   4. Chronic diastolic heart failure ([place])   5. OSA on CPAP    PLAN:    In order of problems listed above:  #Persistent atrial fibrillation and flutter Doing well after her January 12, 2022 catheter ablation. On Eliquis for stroke prophylaxis On propafenone.  Will stop this today and have her wear a 2-week monitor to assess her palpitations.  I am wondering whether or not what she is feeling is actually PVCs given today's EKG and the fact that her episodes now are much different than prior to the ablation.  We discussed the possibility of touchup ablation should her heart monitor show recurrence of atrial fibrillation.  Ideally, given her young age would avoid long-term exposure to an antiarrhythmic.    #Chronic diastolic heart failure NYHA class II.  Warm and dry on exam. Rhythm control indicated as above  Follow-up 6 months with APP.  Or sooner as needed.    Signed, Lars Mage, MD, [facility], [facility] 05/20/2022 10:10 AM    Electrophysiology [facility]

## 2022-05-20 ENCOUNTER — Encounter: Payer: Self-pay | Admitting: Cardiology

## 2022-05-20 ENCOUNTER — Ambulatory Visit (INDEPENDENT_AMBULATORY_CARE_PROVIDER_SITE_OTHER): Payer: BC Managed Care – PPO

## 2022-05-20 ENCOUNTER — Ambulatory Visit: Payer: BC Managed Care – PPO | Attending: Cardiology | Admitting: Cardiology

## 2022-05-20 VITALS — BP 132/80 | HR 64 | Ht 67.0 in | Wt 263.0 lb

## 2022-05-20 DIAGNOSIS — I4819 Other persistent atrial fibrillation: Secondary | ICD-10-CM

## 2022-05-20 DIAGNOSIS — I483 Typical atrial flutter: Secondary | ICD-10-CM

## 2022-05-20 DIAGNOSIS — I5032 Chronic diastolic (congestive) heart failure: Secondary | ICD-10-CM | POA: Diagnosis not present

## 2022-05-20 DIAGNOSIS — I2510 Atherosclerotic heart disease of native coronary artery without angina pectoris: Secondary | ICD-10-CM

## 2022-05-20 DIAGNOSIS — G4733 Obstructive sleep apnea (adult) (pediatric): Secondary | ICD-10-CM

## 2022-05-20 NOTE — Telephone Encounter (Signed)
Patient came into office for a status of forms to be signed by Dr. Rockey Situ please call patient with update

## 2022-05-20 NOTE — Patient Instructions (Signed)
Medication Instructions:  Your physician has recommended you make the following change in your medication:  1) STOP taking propafenone  *If you need a refill on your cardiac medications before your next appointment, please call your pharmacy*  Testing/Procedures: Your physician has recommended that you wear an event monitor. Event monitors are medical devices that record the heart's electrical activity. Doctors most often [place] these monitors to diagnose arrhythmias. Arrhythmias are problems with the speed or rhythm of the heartbeat. The monitor is a small, portable device. You can wear one while you do your normal daily activities. This is usually used to diagnose what is causing palpitations/syncope (passing out).  Follow-Up: At [facility], you and your health needs are our priority.  As part of our continuing mission to provide you with exceptional heart care, we have created designated Provider Care Teams.  These Care Teams include your primary Cardiologist (physician) and Advanced Practice Providers (APPs -  Physician Assistants and Nurse Practitioners) who all work together to provide you with the care you need, when you need it.  Your next appointment:   6 month(s)  Provider:   You will see one of the following Advanced Practice Providers on your designated Care Team:   Mamie Levers, NP   Other Instructions [place] Monitor Instructions  Your physician has requested you wear a ZIO patch monitor for 14 days.  This is a single patch monitor. [employer] supplies one patch monitor per enrollment. Additional stickers are not available. Please do not apply patch if you will be having a Nuclear Stress Test,  Echocardiogram, Cardiac CT, MRI, or Chest Xray during the period you would be wearing the  monitor. The patch cannot be worn during these tests. You cannot remove and re-apply the  ZIO XT patch monitor.  Your ZIO patch monitor will be mailed 3 day USPS to your address  on file. It may take 3-5 days  to receive your monitor after you have been enrolled.  Once you have received your monitor, please review the enclosed instructions. Your monitor  has already been registered assigning a specific monitor serial # to you.  Billing and Patient Assistance Program Information  We have supplied [employer] with any of your insurance information on file for billing purposes. [employer] offers a sliding scale Patient Assistance Program for patients that do not have  insurance, or whose insurance does not completely cover the cost of the ZIO monitor.  You must apply for the Patient Assistance Program to qualify for this discounted rate.  To apply, please call [employer] at [phone], select option 4, select option 2, ask to apply for  Patient Assistance Program. Heather Dudley will ask your household income, and how many people  are in your household. They will quote your out-of-pocket cost based on that information.  [employer] will also be able to set up a 3-month, interest-free payment plan if needed.  Applying the monitor   Shave hair from upper left chest.  Hold abrader disc by orange tab. Rub abrader in 40 strokes over the upper left chest as  indicated in your monitor instructions.  Clean area with 4 enclosed alcohol pads. Let dry.  Apply patch as indicated in monitor instructions. Patch will be placed under collarbone on left  side of chest with arrow pointing upward.  Rub patch adhesive wings for 2 minutes. Remove white label marked "1". Remove the white  label marked "2". Rub patch adhesive wings for 2 additional minutes.  While looking in  a mirror, press and release button in center of patch. A small green light will  flash 3-4 times. This will be your only indicator that the monitor has been turned on.  Do not shower for the first 24 hours. You may shower after the first 24 hours.  Press the button if you feel a symptom. You will hear a small click. Record Date, Time  and  Symptom in the Patient Logbook.  When you are ready to remove the patch, follow instructions on the last 2 pages of Patient  Logbook. Stick patch monitor onto the last page of Patient Logbook.  Place Patient Logbook in the blue and white box. Use locking tab on box and tape box closed  securely. The blue and white box has prepaid postage on it. Please place it in the mailbox as  soon as possible. Your physician should have your test results approximately 7 days after the  monitor has been mailed back to [facility].  Call [facility] at [phone] if you have questions regarding  your ZIO XT patch monitor. Call them immediately if you see an orange light blinking on your  monitor.  If your monitor falls off in less than 4 days, contact our Monitor department at [phone].  If your monitor becomes loose or falls off after 4 days call [employer] at [phone] for  suggestions on securing your monitor

## 2022-05-21 NOTE — Telephone Encounter (Signed)
Forms given to [employer]. For further processing.

## 2022-05-22 NOTE — Telephone Encounter (Signed)
Called patient to pick up forms placed at front desk

## 2022-05-23 DIAGNOSIS — I483 Typical atrial flutter: Secondary | ICD-10-CM

## 2022-05-23 DIAGNOSIS — I4819 Other persistent atrial fibrillation: Secondary | ICD-10-CM | POA: Diagnosis not present

## 2022-05-23 DIAGNOSIS — I5032 Chronic diastolic (congestive) heart failure: Secondary | ICD-10-CM

## 2022-05-23 DIAGNOSIS — I2510 Atherosclerotic heart disease of native coronary artery without angina pectoris: Secondary | ICD-10-CM

## 2022-05-23 DIAGNOSIS — G4733 Obstructive sleep apnea (adult) (pediatric): Secondary | ICD-10-CM

## 2022-05-29 NOTE — Telephone Encounter (Signed)
Patient came to pick up paperwork & states she is missing hospitalization paperwork she gave to nurse on 03/30/22 OV, needs [place]oll[place] signature

## 2022-06-01 ENCOUNTER — Telehealth: Payer: Self-pay | Admitting: Cardiovascular Disease

## 2022-06-01 NOTE — Telephone Encounter (Signed)
Pt called requesting co-pay card. Card placed up front for pick up

## 2022-06-01 NOTE — Telephone Encounter (Signed)
Contacted patient to review her most recent call and documents that we do have on hand. Advised that the Hospital Indemnity clam forms did not have place for Dr to sign and have been completed. She states that another set of forms were not completley filled out and needs that set completed. She states that the company is requesting supporting documents. Advised that the one set has been done and the other set will need to be completed and we can call once those are ready. She verbalized understanding with no further questions at this time.

## 2022-06-01 NOTE — Telephone Encounter (Signed)
Pt c/o medication issue:  1. Name of Medication:   apixaban (ELIQUIS) 5 MG TABS tablet    2. How are you currently taking this medication (dosage and times per day)? As written   3. Are you having a reaction (difficulty breathing--STAT)? no  4. What is your medication issue? Pt called in asking if she can have another $10 copay card because this medication price has went up

## 2022-06-02 NOTE — Telephone Encounter (Signed)
Left voicemail message to call back  

## 2022-06-06 DIAGNOSIS — I10 Essential (primary) hypertension: Secondary | ICD-10-CM | POA: Diagnosis not present

## 2022-06-06 DIAGNOSIS — G4733 Obstructive sleep apnea (adult) (pediatric): Secondary | ICD-10-CM | POA: Diagnosis not present

## 2022-06-08 DIAGNOSIS — M62838 Other muscle spasm: Secondary | ICD-10-CM | POA: Diagnosis not present

## 2022-06-08 DIAGNOSIS — M545 Low back pain, unspecified: Secondary | ICD-10-CM | POA: Diagnosis not present

## 2022-06-11 ENCOUNTER — Telehealth: Payer: Self-pay | Admitting: Cardiovascular Disease

## 2022-06-11 NOTE — Telephone Encounter (Signed)
Pt c/o medication issue:  1. Name of Medication: doxazosin (CARDURA) 1 MG tablet   2. How are you currently taking this medication (dosage and times per day)? Take 1 tablet (1 mg total) by mouth 2 (two) times daily.Patient taking differently: Take 1 mg by mouth 2 (two) times daily.   3. Are you having a reaction (difficulty breathing--STAT)? No  4. What is your medication issue? Pt would like a callback regarding this medication making her feel lightheaded and caused her to fall the other week. She did not take the medication today but she would like to know what her options are. Please advise.

## 2022-06-11 NOTE — Telephone Encounter (Signed)
Returned call to patient.  Patient states she experienced lightheadedness when she first started taking Cardura  BID in February. She decided to push forward to give her body time to adjust to the medication. She states lightheadedness continued, she stopped Cardura and restarted to confirm it was this medication causing her symptoms. She reports yesterday she fell down her stairs at home. She hurt her back but no emergent issues reported.  She states she did not take Cardura today. Reports BP this AM before medications 139/66.  Patient also reports she restarted propafenone  BID last week on 06/04/22 because being off of this medication causes her HR to be "out of control."  Patient states she will not be taking any more Cardura and will continue to monitor her BP and notify our office if her readings are consistently elevated.  Patient finished wearing 2 week Zio heart monitor on 06/07/22, she has not mailed the monitor back in yet. Advised patient to mail her monitor back in ASAP for Dr. Mariah Milling to be able to review those results. Patient verbalized understanding.  Will forward to Dr. Mariah Milling to review and advise.

## 2022-06-11 NOTE — Telephone Encounter (Signed)
This patient had some forms that were signed by Dr. Lalla Brothers but they are not complete. Would you like me to sent them to you in [place]?

## 2022-06-15 NOTE — Telephone Encounter (Signed)
Sent to [employer]

## 2022-06-15 NOTE — Telephone Encounter (Signed)
Patient has been made aware and will monitor her blood pressure. She will call back with an update.   Antonieta Iba, MD  [facility] Triage22 hours ago (11:02 AM)    Would monitor blood pressure closely Cardura can also be taken once a day if blood pressure runs low on twice a day Thx TGollan

## 2022-06-18 ENCOUNTER — Ambulatory Visit: Payer: BC Managed Care – PPO | Admitting: Internal Medicine

## 2022-06-18 ENCOUNTER — Encounter: Payer: Self-pay | Admitting: Internal Medicine

## 2022-06-18 VITALS — BP 126/74 | HR 116 | Temp 99.9°F | Ht 67.0 in | Wt 262.0 lb

## 2022-06-18 DIAGNOSIS — U071 COVID-19: Secondary | ICD-10-CM

## 2022-06-18 DIAGNOSIS — J029 Acute pharyngitis, unspecified: Secondary | ICD-10-CM | POA: Diagnosis not present

## 2022-06-18 DIAGNOSIS — G8929 Other chronic pain: Secondary | ICD-10-CM | POA: Diagnosis not present

## 2022-06-18 DIAGNOSIS — M545 Low back pain, unspecified: Secondary | ICD-10-CM | POA: Diagnosis not present

## 2022-06-18 LAB — POCT RAPID STREP A (OFFICE): Rapid Strep A Screen: POSITIVE — AB

## 2022-06-18 LAB — POC COVID19 BINAXNOW: SARS Coronavirus 2 Ag: POSITIVE — AB

## 2022-06-18 MED ORDER — METHOCARBAMOL 500 MG PO TABS: 500.0000 mg | ORAL_TABLET | Freq: Four times a day (QID) | ORAL | 0 refills | Status: DC

## 2022-06-18 MED ORDER — AZITHROMYCIN 250 MG PO TABS: ORAL_TABLET | ORAL | 0 refills | Status: AC

## 2022-06-18 MED ORDER — PROMETHAZINE-DM 6.25-15 MG/5ML PO SYRP: 5.0000 mL | ORAL_SOLUTION | Freq: Four times a day (QID) | ORAL | 0 refills | Status: AC | PRN

## 2022-06-18 NOTE — Patient Instructions (Signed)
Take Tylenol 650 - 1000 mg every 6-8 hours for fever, body aches and headache. Drink plenty of fluids with electrolytes. Monitor for fever that does not decrease and/or shortness of breath that worsens or is present at rest. Quarantine for 5 days.  

## 2022-06-18 NOTE — Progress Notes (Signed)
Date:  06/18/2022   Name:  Heather Dudley   DOB:  April 07, 1960   MRN:  [id number]   Chief Complaint: Back Pain (Fell down the stairs 2 weeks ago and having back pain. Right sided back pain. ) and Sore Throat  Back Pain This is a new problem. The current episode started 1 to 4 weeks ago (2 weeks). The problem occurs constantly. The problem has been waxing and waning since onset. The pain is present in the lumbar spine. The quality of the pain is described as aching. The pain does not radiate. The symptoms are aggravated by twisting and bending. Associated symptoms include a fever and headaches. Pertinent negatives include no chest pain.  Sore Throat  This is a new problem. The current episode started yesterday. The problem has been waxing and waning. Associated symptoms include coughing, headaches and shortness of breath. Pertinent negatives include no diarrhea, stridor or vomiting.   Lumbar films 2020: FINDINGS: There is no fracture or bone destruction. Disc spaces are well preserved. There is moderate left facet arthritis at L4-5 with sclerosis and some erosions as demonstrated on the prior CT scan.   Lateral alignment is normal.  Sacroiliac joints appear normal.   IMPRESSION: Moderate left facet arthritis at L4-5.  Otherwise, normal exam.  Lab Results  Component Value Date   NA 141 03/19/2022   K 3.8 03/19/2022   CO2 24 03/19/2022   GLUCOSE 98 03/19/2022   BUN 15 03/19/2022   CREATININE 1.06 (H) 03/19/2022   CALCIUM 9.0 03/19/2022   EGFR 60 03/19/2022   GFRNONAA 53 (L) 12/27/2021   Lab Results  Component Value Date   CHOL 129 03/19/2022   HDL 40 03/19/2022   LDLCALC 75 03/19/2022   TRIG 66 03/19/2022   CHOLHDL 3.2 03/19/2022   Lab Results  Component Value Date   TSH 1.140 12/26/2021   Lab Results  Component Value Date   HGBA1C 6.3 (A) 03/18/2022   Lab Results  Component Value Date   WBC 11.1 (H) 12/27/2021   HGB 13.4 12/27/2021   HCT 41.0 12/27/2021    MCV 87.4 12/27/2021   PLT 308 12/27/2021   Lab Results  Component Value Date   ALT 13 03/19/2022   AST 11 03/19/2022   ALKPHOS 115 03/19/2022   BILITOT 0.4 03/19/2022   Lab Results  Component Value Date   VD25OH 20.0 (L) 07/01/2016     Review of Systems  Constitutional:  Positive for fever. Negative for chills and fatigue.  HENT:  Positive for sore throat.   Eyes:  Negative for visual disturbance.  Respiratory:  Positive for cough and shortness of breath. Negative for stridor.   Cardiovascular:  Negative for chest pain and palpitations.  Gastrointestinal:  Negative for diarrhea, nausea and vomiting.  Musculoskeletal:  Positive for back pain. Negative for gait problem and myalgias.  Neurological:  Positive for headaches. Negative for dizziness and light-headedness.  Psychiatric/Behavioral:  Negative for dysphoric mood and sleep disturbance. The patient is not nervous/anxious.     Patient Active Problem List   Diagnosis Date Noted   Generalized anxiety disorder 05/05/2022   Persistent atrial fibrillation 03/29/2022   Acquired thrombophilia 03/18/2022   Secondary and unspecified malignant neoplasm of lymph node, unspecified 03/18/2022   Chronic diastolic heart failure 03/18/2022   Prediabetes 12/16/2021   CAD (coronary artery disease), native coronary artery 12/06/2021   Lymphedema of left arm 09/23/2021   Nevus of neck 09/23/2021   Blood in stool 05/13/2021  Peroneal tendinitis, left 09/30/2020   OSA on CPAP 07/12/2020   Breast cyst, right 05/08/2020   Status post hysterectomy 04/03/2020   Colon cancer screening 07/13/2019   Multiple thyroid nodules 08/23/2018   Mixed hyperlipidemia 07/06/2018   Microscopic hematuria 05/04/2018   Liver hemangioma 03/26/2017   Panic disorder 06/15/2016   Macroglossia 02/05/2016   Environmental and seasonal allergies 01/02/2016   Hot flashes related to aromatase inhibitor therapy 09/08/2015   Vitamin D deficiency 04/19/2015    Cervical radiculopathy 04/17/2015   Essential hypertension 04/17/2015   History of partial thyroidectomy 04/17/2015   Obesity, Class II, BMI 35-39.9 04/17/2015   Lipoma of axilla 10/24/2014   History of left breast cancer 09/02/2010    Allergies  Allergen Reactions   Ace Inhibitors Swelling   Sernivo [Betamethasone Dipropionate] Itching   Penicillins Rash    Has patient had a PCN reaction causing immediate rash, facial/tongue/throat swelling, SOB or lightheadedness with hypotension: Yes Has patient had a PCN reaction causing severe rash involving mucus membranes or skin necrosis: No Has patient had a PCN reaction that required hospitalization: No Has patient had a PCN reaction occurring within the last 10 years: No If all of the above answers are "NO", then may proceed with Cephalosporin use.    Past Surgical History:  Procedure Laterality Date   ABDOMINAL HYSTERECTOMY  2011   ovaries remain   ATRIAL FIBRILLATION ABLATION N/A 01/12/2022   Procedure: ATRIAL FIBRILLATION ABLATION;  Surgeon: Lanier Prude, MD;  Location: [facility] INVASIVE CV LAB;  Service: Cardiovascular;  Laterality: N/A;   birth mark removed     BREAST CYST ASPIRATION Right    negative 01/2010   BREAST EXCISIONAL BIOPSY  08/05/2010   left breast positive 07/2010   BREAST LUMPECTOMY  2012   left breast   COLONOSCOPY  07/29/2012   normal study.   LEFT HEART CATH AND CORONARY ANGIOGRAPHY N/A 12/08/2021   Procedure: LEFT HEART CATH AND CORONARY ANGIOGRAPHY;  Surgeon: Iran Ouch, MD;  Location: [facility] INVASIVE CV LAB;  Service: Cardiovascular;  Laterality: N/A;   robotic tonsillectomy Lingual tonsils Bilateral 06/08/2016   THYROID SURGERY     partially removed   TUBAL LIGATION      Social History   Tobacco Use   Smoking status: Never   Smokeless tobacco: Never   Tobacco comments:    Never smoke 02/09/22  Vaping Use   Vaping Use: Never used  Substance Use Topics   Alcohol use: Not Currently     Comment: occ   Drug use: Never     Medication list has been reviewed and updated.  Current Meds  Medication Sig   apixaban (ELIQUIS) 5 MG TABS tablet Take 1 tablet (5 mg total) by mouth 2 (two) times daily.   atorvastatin (LIPITOR) 10 MG tablet Take 1 tablet (10 mg total) by mouth daily.   azithromycin (ZITHROMAX Z-PAK) 250 MG tablet UAD   B Complex-Biotin-FA (B-COMPLEX PO) Take 1 tablet by mouth daily.   furosemide (LASIX) 20 MG tablet TAKE 1 TABLET (20 MG TOTAL) BY MOUTH DAILY. INCREASE TO 1 TABLET (20 MG TOTAL) BY MOUTH TWICE DAILY (TOTAL DAILY DOSE 40 MG) AS NEEDED FOR UP TO 3 DAYS FOR INCREASED LEG SWELLING, SHORTNESS OF BREATH, WEIGHT GAIN 5+ LBS OVER 1-2 DAYS. SEEK MEDICAL CARE IF THESE SYMPTOMS ARE NOT IMPROVING WITH INCREASED DOSE.   levothyroxine (SYNTHROID) 75 MCG tablet Take 75 mcg by mouth daily.   LORazepam (ATIVAN) 0.5 MG tablet Take 1 tablet (0.5  mg total) by mouth every 8 (eight) hours as needed. for anxiety   methocarbamol (ROBAXIN) 500 MG tablet Take 1 tablet (500 mg total) by mouth 4 (four) times daily.   metoprolol succinate (TOPROL-XL) 50 MG 24 hr tablet Take 1.5 tablets (75 mg total) by mouth 2 (two) times daily. Take with or immediately following a meal.   nitroGLYCERIN (NITROSTAT) 0.4 MG SL tablet Place 1 tablet (0.4 mg total) under the tongue every 5 (five) minutes as needed for chest pain.   potassium chloride SA (KLOR-CON M) 20 MEQ tablet Take 1 tablet (20 mEq total) by mouth daily.   promethazine-dextromethorphan (PROMETHAZINE-DM) 6.25-15 MG/5ML syrup Take 5 mLs by mouth 4 (four) times daily as needed for up to 9 days for cough.   propafenone (RYTHMOL SR) 225 MG 12 hr capsule Take 225 mg by mouth 2 (two) times daily.   [DISCONTINUED] doxazosin (CARDURA) 1 MG tablet Take 1 tablet (1 mg total) by mouth 2 (two) times daily. (Patient taking differently: Take 1 mg by mouth 2 (two) times daily.)   [DISCONTINUED] ezetimibe (ZETIA) 10 MG tablet Take 1 tablet (10 mg total)  by mouth daily.       05/05/2022    1:44 PM 03/18/2022    2:54 PM 12/16/2021    1:59 PM 09/23/2021    2:56 PM  GAD 7 : Generalized Anxiety Score  Nervous, Anxious, on Edge 2 1 2 1   Control/stop worrying 2 0 2 0  Worry too much - different things 2 1 2 1   Trouble relaxing 1 0 0 0  Restless 0 1 0 0  Easily annoyed or irritable 1 1 0 0  Afraid - awful might happen 1 0 1 0  Total GAD 7 Score 9 4 7 2   Anxiety Difficulty Somewhat difficult Somewhat difficult Somewhat difficult Not difficult at all       05/05/2022    1:43 PM 03/18/2022    2:54 PM 12/16/2021    1:59 PM  Depression screen PHQ 2/9  Decreased Interest 1 1 0  Down, Depressed, Hopeless 2 1 0  PHQ - 2 Score 3 2 0  Altered sleeping 3 1 2   Tired, decreased energy 2 1 2   Change in appetite 2 1 0  Feeling bad or failure about yourself  2 1 0  Trouble concentrating 1 1 0  Moving slowly or fidgety/restless 2 1 0  Suicidal thoughts 0 0 0  PHQ-9 Score 15 8 4   Difficult doing work/chores Somewhat difficult Somewhat difficult Somewhat difficult    BP Readings from Last 3 Encounters:  06/18/22 126/74  05/20/22 132/80  05/05/22 128/74    Physical Exam Vitals and nursing note reviewed.  Constitutional:      General: She is not in acute distress.    Appearance: She is well-developed. She is ill-appearing.  HENT:     Head: Normocephalic and atraumatic.     Nose:     Right Sinus: No maxillary sinus tenderness.     Left Sinus: No maxillary sinus tenderness.  Cardiovascular:     Rate and Rhythm: Normal rate and regular rhythm.  Pulmonary:     Effort: Pulmonary effort is normal. No accessory muscle usage or respiratory distress.     Breath sounds: Normal breath sounds. No wheezing or rhonchi.  Abdominal:     Palpations: Abdomen is soft.  Musculoskeletal:     Cervical back: Normal range of motion.     Lumbar back: No tenderness or bony tenderness. Normal  range of motion. No scoliosis.  Lymphadenopathy:     Cervical: No  cervical adenopathy.  Skin:    General: Skin is warm and dry.     Findings: No rash.  Neurological:     Mental Status: She is alert and oriented to person, place, and time.  Psychiatric:        Mood and Affect: Mood normal.        Behavior: Behavior normal.     Wt Readings from Last 3 Encounters:  06/18/22 262 lb (118.8 kg)  05/20/22 263 lb (119.3 kg)  05/05/22 268 lb (121.6 kg)    BP 126/74   Pulse (!) 116   Temp 99.9 F (37.7 C) (Oral)   Ht  (1.702 m)   Wt 262 lb (118.8 kg)   LMP 04/01/2008   SpO2 97%   BMI 41.04 kg/m   Assessment and Plan:  Problem List Items Addressed This Visit   None Visit Diagnoses     Chronic right-sided low back pain without sciatica    -  Primary   continue tylenol, add Robaxin already completed steroid taper from [facility] continue heat or ice   Relevant Medications   methocarbamol (ROBAXIN) 500 MG tablet   Sore throat       Relevant Medications   azithromycin (ZITHROMAX Z-PAK) 250 MG tablet   Other Relevant Orders   POC COVID-19 BinaxNow (Completed)   POCT rapid strep A (Completed)   COVID-19       defer anti-viral therapy to previous intolerance and multiple med interactions Tylenol, fluids and rest see care if worsening   Relevant Medications   azithromycin (ZITHROMAX Z-PAK) 250 MG tablet   promethazine-dextromethorphan (PROMETHAZINE-DM) 6.25-15 MG/5ML syrup       No follow-ups on file.   Partially dictated using Dragon software, any errors are not intentional.  Reubin Milan, MD [facility] [place], [place]

## 2022-06-22 NOTE — Telephone Encounter (Signed)
Forms have been completed and faxed. 

## 2022-07-06 DIAGNOSIS — I10 Essential (primary) hypertension: Secondary | ICD-10-CM | POA: Diagnosis not present

## 2022-07-06 DIAGNOSIS — G4733 Obstructive sleep apnea (adult) (pediatric): Secondary | ICD-10-CM | POA: Diagnosis not present

## 2022-07-10 DIAGNOSIS — E890 Postprocedural hypothyroidism: Secondary | ICD-10-CM | POA: Diagnosis not present

## 2022-07-14 ENCOUNTER — Other Ambulatory Visit: Payer: Self-pay | Admitting: Otolaryngology

## 2022-07-14 ENCOUNTER — Other Ambulatory Visit: Payer: Self-pay | Admitting: Internal Medicine

## 2022-07-14 DIAGNOSIS — E041 Nontoxic single thyroid nodule: Secondary | ICD-10-CM

## 2022-07-14 DIAGNOSIS — Z1231 Encounter for screening mammogram for malignant neoplasm of breast: Secondary | ICD-10-CM

## 2022-07-15 ENCOUNTER — Ambulatory Visit: Payer: BC Managed Care – PPO | Admitting: Cardiology

## 2022-07-17 ENCOUNTER — Encounter: Payer: BC Managed Care – PPO | Admitting: Internal Medicine

## 2022-07-22 ENCOUNTER — Ambulatory Visit
Admission: RE | Admit: 2022-07-22 | Discharge: 2022-07-22 | Disposition: A | Discharge status: Home / Self Care | Payer: BC Managed Care – PPO | Source: Ambulatory Visit | Attending: Otolaryngology | Admitting: Otolaryngology

## 2022-07-22 DIAGNOSIS — E041 Nontoxic single thyroid nodule: Secondary | ICD-10-CM

## 2022-08-03 ENCOUNTER — Telehealth: Payer: Self-pay | Admitting: Cardiology

## 2022-08-03 NOTE — Telephone Encounter (Signed)
STAT if HR is under 50 or over 120 (normal HR is 60-100 beats per minute)  What is your heart rate? 48-50,   Do you have a log of your heart rate readings (document readings)? Last night 48,  This morning it was 64, yesterday was 66 and also 48  Do you have any other symptoms? Just feels like "kinda blah" no energy   Patient states she has not been taking Propafenone at night due to her low heart rate, patient states she also takes Metoprolol for her Blood pressure.  Pt c/o BP issue: STAT if pt c/o blurred vision, one-sided weakness or slurred speech  1. What are your last 5 BP readings?  Today 149/78 HR 64 Yesterday-last night 135/66 HR 73 Saturday night 143/65 HR 76  2. Are you having any other symptoms (ex. Dizziness, headache, blurred vision, passed out)? No just some lightheadedness  3. What is your BP issue? Patient thinks it is low  Please call to discuss.

## 2022-08-03 NOTE — Telephone Encounter (Signed)
Spoke with patient and she wanted to know if her BP and HR were going too low because of the symptoms she was experiencing. Patient stated that last night (08/02/22) her HR was 48-63. Patient stated that she was lying down at the time but wasn't sure if it was due to inactivity or not. Informed patient that the metoprolol succinate (TOPROL-XL) 50 MG 24 hr tablet that she takes twice a day will slow your heart rate, which is how it effects the blood pressure. Informed her that a side of this medication is dizziness and lightheadedness. Instructed patient to change positions slowly, from lying to sitting and sitting to standing to avoid sudden drop in BP and prevent falls. Patient was satisfied with response and stated she would monitor symptoms for now and call back with any concerns.

## 2022-08-19 NOTE — Progress Notes (Unsigned)
Electrophysiology Office Follow up Visit Note:    Date:  08/20/2022   ID:  Heather Dudley, DOB 10-16-1960, MRN [id number]  PCP:  Reubin Milan, MD  [facility] Cardiologist:  Julien Nordmann, MD  [facility] Electrophysiologist:  Lanier Prude, MD    Interval History:    Heather Dudley is a 62 y.o. female who presents for a follow up visit.   Last seen 05/20/2022. Had a PVI 01/12/2022. Did well post op. Stopped propafenone 05/20/2022.  She wore a heart monitor that resulted 5/20224 and showed recurrent AF/AFL.  She brings in her [facility] today.  She is continuing to have symptomatic episodes of atrial fibrillation despite treatment with twice daily propafenone.  She feels fatigue and palpitations when she is out of rhythm.      Past medical, surgical, social and family history were reviewed.  ROS:   Please see the history of present illness.    All other systems reviewed and are negative.  EKGs/Labs/Other Studies Reviewed:    The following studies were reviewed today:  06/25/2022 zio personally reviewed HR 60 - 184 bpm, average 85 bpm. 233 nonsustained SVT, longest 10 beats. 19% burden of AFL/AF, average rate 117 bpm. Frequent supraventricular ectopy, 9%. Rare ventricular ectopy.    EKG Interpretation  Date/Time:  Thursday August 20 2022 08:03:22 EDT Ventricular Rate:  102 PR Interval:    QRS Duration: 96 QT Interval:  362 QTC Calculation: 471 R Axis:   -35 Text Interpretation: Atrial flutter with variable A-V block Low voltage QRS Atrial flutter has replaced Sinus rhythm Confirmed by Steffanie Dunn [phone]) on 08/20/2022 8:16:54 AM    Physical Exam:    VS:  BP (!) 138/98   Pulse (!) 102   Ht 5\' 7"  (1.702 m)   Wt 266 lb 12.8 oz (121 kg)   LMP 04/01/2008   SpO2 99%   BMI 41.79 kg/m     Wt Readings from Last 3 Encounters:  08/20/22 266 lb 12.8 oz (121 kg)  06/18/22 262 lb (118.8 kg)  05/20/22 263 lb (119.3 kg)     GEN:  Well  nourished, well developed in no acute distress CARDIAC: Irregularly irregular, tachycardic, no murmurs, rubs, gallops RESPIRATORY:  Clear to auscultation without rales, wheezing or rhonchi       ASSESSMENT:    1. Persistent atrial fibrillation (HCC)   2. Typical atrial flutter (HCC)   3. OSA on CPAP   4. Essential hypertension   5. Encounter for long-term (current) use of high-risk medication    PLAN:    In order of problems listed above:  #Persistent AF/AFL #High risk med monitoring-propafenone S/p prior ablation 01/12/2022 during which the veins, posterior wall and CTI were ablated. Now with recurrence.  She is back on her propafenone and is still having salvos of symptomatic atrial fibrillation.  QRS duration acceptable for continued propafenone use.  Discussed treatment options today for AF including antiarrhythmic drug therapy and ablation. Discussed risks, recovery and likelihood of success with each treatment strategy. Risk, benefits, and alternatives to EP study and radiofrequency ablation for afib were discussed. These risks include but are not limited to stroke, bleeding, vascular damage, tamponade, perforation, damage to the esophagus, lungs, phrenic nerve and other structures, pulmonary vein stenosis, worsening renal function, and death.  Discussed potential need for repeat ablation procedures and antiarrhythmic drugs after an initial ablation. The patient understands these risk and wishes to proceed.  We will therefore proceed with catheter ablation at  the next available time.  Carto, ICE, anesthesia are requested for the procedure.  Will also obtain CT PV protocol prior to the procedure to exclude LAA thrombus and further evaluate atrial anatomy.   #OSA CPAP use encouraged.  #Hypertension At goal today.  Recommend checking blood pressures 1-2 times per week at home and recording the values.  Recommend bringing these recordings to the primary care  physician.      Signed, Steffanie Dunn, MD, [facility], [facility] 08/20/2022 8:18 AM    Electrophysiology [facility]

## 2022-08-20 ENCOUNTER — Encounter: Payer: Self-pay | Admitting: Cardiology

## 2022-08-20 ENCOUNTER — Ambulatory Visit: Payer: BC Managed Care – PPO | Attending: Cardiology | Admitting: Cardiology

## 2022-08-20 VITALS — BP 138/98 | HR 102 | Ht 67.0 in | Wt 266.8 lb

## 2022-08-20 DIAGNOSIS — I483 Typical atrial flutter: Secondary | ICD-10-CM

## 2022-08-20 DIAGNOSIS — G4733 Obstructive sleep apnea (adult) (pediatric): Secondary | ICD-10-CM | POA: Diagnosis not present

## 2022-08-20 DIAGNOSIS — I10 Essential (primary) hypertension: Secondary | ICD-10-CM

## 2022-08-20 DIAGNOSIS — Z79899 Other long term (current) drug therapy: Secondary | ICD-10-CM

## 2022-08-20 DIAGNOSIS — I4819 Other persistent atrial fibrillation: Secondary | ICD-10-CM

## 2022-08-20 NOTE — Patient Instructions (Addendum)
Medication Instructions:  Your physician recommends that you continue on your current medications as directed. Please refer to the Current Medication list given to you today.  *If you need a refill on your cardiac medications before your next appointment, please call your pharmacy*   Lab Work: BMET and CBC prior to CT and ablation  Testing/Procedures: Your physician has requested that you have cardiac CT. Cardiac computed tomography (CT) is a painless test that uses an x-ray machine to take clear, detailed pictures of your heart. For further information please visit [email or web address] We will call you to schedule your CT scan. It will be done about one week prior to your ablation.  Your physician has recommended that you have an ablation. Catheter ablation is a medical procedure used to treat some cardiac arrhythmias (irregular heartbeats). During catheter ablation, a long, thin, flexible tube is put into a blood vessel in your groin (upper thigh), or neck. This tube is called an ablation catheter. It is then guided to your heart through the blood vessel. Radio frequency waves destroy small areas of heart tissue where abnormal heartbeats may cause an arrhythmia to start. You are scheduled for Atrial Fibrillation Ablation on Monday, November 18 with Dr. Steffanie Dunn.Please arrive at the Main Entrance A at [facility]: [address] [place], [place] [address] at 8:30 AM   Follow-Up: At [facility], you and your health needs are our priority.  As part of our continuing mission to provide you with exceptional heart care, we have created designated Provider Care Teams.  These Care Teams include your primary Cardiologist (physician) and Advanced Practice Providers (APPs -  Physician Assistants and Nurse Practitioners) who all work together to provide you with the care you need, when you need it.  Your next appointment:   We will call you to schedule your follow up appointments

## 2022-08-20 NOTE — Addendum Note (Signed)
Addended by: Frutoso Schatz on: 08/20/2022 08:22 AM   Modules accepted: Orders

## 2022-09-02 ENCOUNTER — Ambulatory Visit: Payer: BC Managed Care – PPO | Admitting: Cardiology

## 2022-09-17 DIAGNOSIS — Z0289 Encounter for other administrative examinations: Secondary | ICD-10-CM

## 2022-09-21 ENCOUNTER — Ambulatory Visit (INDEPENDENT_AMBULATORY_CARE_PROVIDER_SITE_OTHER): Payer: BC Managed Care – PPO | Admitting: Family Medicine

## 2022-09-21 ENCOUNTER — Encounter (INDEPENDENT_AMBULATORY_CARE_PROVIDER_SITE_OTHER): Payer: Self-pay | Admitting: Family Medicine

## 2022-09-21 VITALS — BP 146/82 | HR 67 | Temp 98.2°F | Ht 66.0 in | Wt 263.0 lb

## 2022-09-21 DIAGNOSIS — E669 Obesity, unspecified: Secondary | ICD-10-CM | POA: Diagnosis not present

## 2022-09-21 DIAGNOSIS — Z6841 Body Mass Index (BMI) 40.0 and over, adult: Secondary | ICD-10-CM | POA: Diagnosis not present

## 2022-09-21 DIAGNOSIS — E782 Mixed hyperlipidemia: Secondary | ICD-10-CM

## 2022-09-21 DIAGNOSIS — I4819 Other persistent atrial fibrillation: Secondary | ICD-10-CM | POA: Diagnosis not present

## 2022-09-22 DIAGNOSIS — G4733 Obstructive sleep apnea (adult) (pediatric): Secondary | ICD-10-CM | POA: Diagnosis not present

## 2022-09-22 DIAGNOSIS — I10 Essential (primary) hypertension: Secondary | ICD-10-CM | POA: Diagnosis not present

## 2022-09-22 NOTE — Progress Notes (Signed)
Office: [phone]  /  Fax: [phone]   Initial Visit  Heather Dudley was seen in clinic today to evaluate for obesity. She is interested in losing weight to improve overall health and reduce the risk of weight related complications. She presents today to review program treatment options, initial physical assessment, and evaluation.     She was referred by: PCP  Some associated conditions: Hypertension and Heart disease  Weight promoting medications identified: None  Current nutrition plan: None  Current level of physical activity: None  Current or previous pharmacotherapy: None   Past medical history includes:   Past Medical History:  Diagnosis Date   Basal cell carcinoma of forehead    birthmark of forehead   Breast cancer (HCC)    Breast screening, unspecified    Cellulitis and abscess of trunk    Diastolic dysfunction    a. 05/2019 Echo: EF 55-60%, no rwma, mild LVH, Gr2 DD, Nl RV size/fxn. Triv MR.   Hernia    History of chemotherapy 2012   Hypertension    Lump or mass in breast    Malignant neoplasm of breast (female), unspecified site    She underwent wide excision, mastoplasty and axillary dissection for her left breast malignancy on September 03, 2010. The primary tumor was 1 cm in diameter, and a single positive axillary node 1.3 cm in diameter. This was an ER-positive, PR slightly positive, HER-2/neu not overexpressing tumor. She tolerated her whole breast radiation without difficulty ending in late February 2013.   Malignant neoplasm of upper-outer quadrant of female breast (HCC)    Neoplasm of uncertain behavior of connective and other soft tissue    Obesity, unspecified    PAF (paroxysmal atrial fibrillation) (HCC) 05/04/2017   a. 04/2019 Zio: RSR, avg HR 79. 2% PAF burden (73-175, avg 115; longest 28secs). CHA2DS2VASc = 2-->Xarelto.   Personal history of chemotherapy    Personal history of malignant neoplasm of breast    The patient underwent  wide excision, mastoplasty. Dissection for a T1b, N1, M0 carcinoma left breast on September 03, 2010.The primary tumor was 1 cm in diameter, and a single positive axillary node 1.3 cm in diameter. This was an ER-positive, PR slightly positive, HER-2/neu not overexpressing tumor.  The patient completed whole breast radiation in February 2013.   Personal history of radiation therapy 2013   LEFT lumpectomy   Screening for obesity    Thyroid nodule      Objective:   BP (!) 146/82   Pulse 67   Temp 98.2 F (36.8 C)   Ht 5\' 6"  (1.676 m)   Wt 263 lb (119.3 kg)   LMP 04/01/2008   SpO2 99%   BMI 42.45 kg/m  She was weighed on the bioimpedance scale: Body mass index is 42.45 kg/m.  Peak Weight: 265 lbs ,Visceral Fat Rating:15, Body Fat%:45.4.  General:  Alert, oriented and cooperative. Patient is in no acute distress.  Respiratory: Normal respiratory effort, no problems with respiration noted  Extremities: Normal range of motion.    Mental Status: Normal mood and affect. Normal behavior. Normal judgment and thought content.   Assessment and Plan:  1. Mixed hyperlipidemia Patient is on statin, and she wants to improve with weight loss.  Plan: Patient will continue her statin, and we will schedule her for testing and follow-up appointment.  2. Persistent atrial fibrillation [facility]) Patient is followed by cardiology, and she is ready to work on weight loss.  Plan: Patient will be scheduled for  a workup, and we will follow-up for testing.  3. BMI 40.0-44.9, adult (HCC)  4. Obesity with starting BMI 42 We reviewed weight, biometrics, associated medical conditions and contributing factors with patient. She would benefit from weight loss therapy via a modified calorie, low-carb, high-protein nutritional plan tailored to their REE (resting energy expenditure) which will be determined by indirect calorimetry.  We will also assess for cardiometabolic risk and nutritional derangements via fasting  serologies at her next appointment.      Obesity Treatment / Action Plan:  Patient will work on garnering support from family and friends to begin weight loss journey. Will complete provided nutritional and psychosocial assessment questionnaire before the next appointment. Will be scheduled for indirect calorimetry to determine resting energy expenditure in a fasting state.  This will allow [place] to create a reduced calorie, high-protein meal plan to promote loss of fat mass while preserving muscle mass. Will think about ideas on how to incorporate physical activity into their daily routine.  Obesity Education Performed Today:  She was weighed on the bioimpedance scale and results were discussed and documented in the synopsis.  We discussed obesity as a disease and the importance of a more detailed evaluation of all the factors contributing to the disease.  We discussed the importance of long term lifestyle changes which include nutrition, exercise and behavioral modifications as well as the importance of customizing this to her specific health and social needs.  We discussed the benefits of reaching a healthier weight to alleviate the symptoms of existing conditions and reduce the risks of the biomechanical, metabolic and psychological effects of obesity.  Heather Dudley appears to be in the action stage of change and states they are ready to start intensive lifestyle modifications and behavioral modifications.  35 minutes was spent today on this visit including the above counseling, pre-visit chart review, and post-visit documentation.  Reviewed by clinician on day of visit: allergies, medications, problem list, medical history, surgical history, family history, social history, and previous encounter notes.   I, Burt Knack, am acting as transcriptionist for Quillian Quince, MD   I have reviewed the above documentation for accuracy and completeness, and I agree with the above. Quillian Quince, MD

## 2022-09-25 ENCOUNTER — Telehealth: Payer: Self-pay | Admitting: Internal Medicine

## 2022-09-25 NOTE — Telephone Encounter (Signed)
Noted  KP 

## 2022-09-25 NOTE — Telephone Encounter (Signed)
routed

## 2022-09-25 NOTE — Telephone Encounter (Signed)
Copied from CRM [id number]. Topic: General - Inquiry >> Sep 25, 2022  2:17 PM De Blanch wrote: Reason for CRM: Pt stated she discussed with Dr. Judithann Graves getting her cholesterol checked and wants to make sure that these orders are added to her upcoming labs for physical.  Please advise.

## 2022-09-30 ENCOUNTER — Encounter: Payer: Self-pay | Admitting: Internal Medicine

## 2022-09-30 ENCOUNTER — Ambulatory Visit (INDEPENDENT_AMBULATORY_CARE_PROVIDER_SITE_OTHER): Payer: BC Managed Care – PPO | Admitting: Internal Medicine

## 2022-09-30 VITALS — BP 128/78 | HR 68 | Ht 66.0 in | Wt 267.0 lb

## 2022-09-30 DIAGNOSIS — R42 Dizziness and giddiness: Secondary | ICD-10-CM

## 2022-09-30 DIAGNOSIS — Z1211 Encounter for screening for malignant neoplasm of colon: Secondary | ICD-10-CM

## 2022-09-30 DIAGNOSIS — I10 Essential (primary) hypertension: Secondary | ICD-10-CM

## 2022-09-30 DIAGNOSIS — R7303 Prediabetes: Secondary | ICD-10-CM

## 2022-09-30 DIAGNOSIS — Z1231 Encounter for screening mammogram for malignant neoplasm of breast: Secondary | ICD-10-CM | POA: Diagnosis not present

## 2022-09-30 DIAGNOSIS — E782 Mixed hyperlipidemia: Secondary | ICD-10-CM | POA: Diagnosis not present

## 2022-09-30 DIAGNOSIS — Z0000 Encounter for general adult medical examination without abnormal findings: Secondary | ICD-10-CM | POA: Diagnosis not present

## 2022-09-30 DIAGNOSIS — K219 Gastro-esophageal reflux disease without esophagitis: Secondary | ICD-10-CM

## 2022-09-30 DIAGNOSIS — F411 Generalized anxiety disorder: Secondary | ICD-10-CM

## 2022-09-30 DIAGNOSIS — E042 Nontoxic multinodular goiter: Secondary | ICD-10-CM

## 2022-09-30 MED ORDER — FAMOTIDINE 40 MG PO TABS
40.0000 mg | ORAL_TABLET | Freq: Every day | ORAL | 1 refills | Status: DC
Start: 2022-09-30 — End: 2023-10-04

## 2022-09-30 MED ORDER — ROSUVASTATIN CALCIUM 5 MG PO TABS: 5.0000 mg | ORAL_TABLET | Freq: Every day | ORAL | 1 refills | Status: DC

## 2022-09-30 MED ORDER — LORAZEPAM 0.5 MG PO TABS
0.5000 mg | ORAL_TABLET | Freq: Three times a day (TID) | ORAL | 0 refills | Status: DC | PRN
Start: 2022-09-30 — End: 2023-06-15

## 2022-09-30 NOTE — Assessment & Plan Note (Signed)
Normal exam with stable BP on metoprolol and lasix. No concerns or side effects to current medication. No change in regimen; continue low sodium diet.

## 2022-09-30 NOTE — Assessment & Plan Note (Signed)
Blood sugar managed with diet.

## 2022-09-30 NOTE — Assessment & Plan Note (Signed)
symptoms becoming more symptomatic. No clear cause but pt is unsteady and worried about falls rec ENT evaluation - established Dr. Elenore Rota

## 2022-09-30 NOTE — Assessment & Plan Note (Signed)
Reflux symptoms are occurring daily.  She uses baking soda as needed. She is elevating her bed.  We discussed other antireflux measures. Will start Pepcid daily. No red flag signs such as weight loss, n/v, melena

## 2022-09-30 NOTE — Assessment & Plan Note (Addendum)
Supplemented.  Followed by Dr. Elenore Rota. Lab Results  Component Value Date   TSH 1.140 12/26/2021

## 2022-09-30 NOTE — Patient Instructions (Signed)
I recommend that you see ENT to discuss the Vertigo symptoms.  He needs to evaluate your for an inner ear problem that can affect your balance.  Call [facility] to schedule your mammogram at [phone].

## 2022-09-30 NOTE — Assessment & Plan Note (Addendum)
Has not tolerated Zetia or Lipitor well in the past; she is taking Lipitor currently Will check labs and change Rx to Crestor 5 mg.

## 2022-09-30 NOTE — Progress Notes (Signed)
Date:  09/30/2022   Name:  Heather Dudley   DOB:  Nov 21, 1960   MRN:  [id number]   Chief Complaint: Annual Exam Heather Dudley is a 62 y.o. female who presents today for her Complete Annual Exam. She feels fairly well. She reports exercising - none. She reports she is sleeping poorly. Breast complaints - none.  Mammogram: 07/2021 - ordered but not scheduled DEXA: none Pap smear: discontinued Colonoscopy: 07/2012 repeat 10 yrs [facility])  Health Maintenance Due  Topic Date Due   Pneumococcal Vaccine 72-60 Years old (1 of 2 - PCV) Never done   Colonoscopy  07/13/2022   MAMMOGRAM  08/02/2022   INFLUENZA VACCINE  09/24/2022    Immunization History  Administered Date(s) Administered   PFIZER(Purple Top)SARS-COV-2 Vaccination 06/24/2019, 07/14/2019   Zoster Recombinant(Shingrix) 12/20/2020, 03/28/2021    Hypertension This is a chronic problem. The problem is controlled. Pertinent negatives include no chest pain, headaches, palpitations or shortness of breath. Past treatments include diuretics and beta blockers. The current treatment provides significant improvement.  Gastroesophageal Reflux She complains of heartburn. She reports no abdominal pain, no chest pain, no coughing or no wheezing. This is a recurrent problem. The problem occurs frequently. Pertinent negatives include no fatigue. She has tried an antacid and head elevation for the symptoms.  Dizziness This is a recurrent problem. The problem occurs every several days. The problem has been unchanged. Pertinent negatives include no abdominal pain, arthralgias, chest pain, chills, congestion, coughing, fatigue, fever, headaches, joint swelling, rash or vomiting. Exacerbated by: lying down, standing for some time. She has tried nothing for the symptoms.    Lab Results  Component Value Date   NA 141 03/19/2022   K 3.8 03/19/2022   CO2 24 03/19/2022   GLUCOSE 98 03/19/2022   BUN 15 03/19/2022   CREATININE 1.06 (H)  03/19/2022   CALCIUM 9.0 03/19/2022   EGFR 60 03/19/2022   GFRNONAA 53 (L) 12/27/2021   Lab Results  Component Value Date   CHOL 129 03/19/2022   HDL 40 03/19/2022   LDLCALC 75 03/19/2022   TRIG 66 03/19/2022   CHOLHDL 3.2 03/19/2022   Lab Results  Component Value Date   TSH 1.140 12/26/2021   Lab Results  Component Value Date   HGBA1C 6.3 (A) 03/18/2022   Lab Results  Component Value Date   WBC 11.1 (H) 12/27/2021   HGB 13.4 12/27/2021   HCT 41.0 12/27/2021   MCV 87.4 12/27/2021   PLT 308 12/27/2021   Lab Results  Component Value Date   ALT 13 03/19/2022   AST 11 03/19/2022   ALKPHOS 115 03/19/2022   BILITOT 0.4 03/19/2022   Lab Results  Component Value Date   VD25OH 20.0 (L) 07/01/2016     Review of Systems  Constitutional:  Negative for chills, fatigue and fever.  HENT:  Positive for tinnitus. Negative for congestion, hearing loss and voice change.   Eyes:  Negative for visual disturbance.  Respiratory:  Negative for cough, chest tightness, shortness of breath and wheezing.   Cardiovascular:  Negative for chest pain, palpitations and leg swelling.  Gastrointestinal:  Positive for heartburn. Negative for abdominal pain, constipation, diarrhea and vomiting.  Endocrine: Negative for polydipsia and polyuria.  Genitourinary:  Negative for dysuria, frequency, genital sores, vaginal bleeding and vaginal discharge.  Musculoskeletal:  Negative for arthralgias, gait problem and joint swelling.  Skin:  Negative for color change and rash.  Neurological:  Positive for dizziness. Negative for tremors, light-headedness and  headaches.  Hematological:  Negative for adenopathy. Does not bruise/bleed easily.  Psychiatric/Behavioral:  Negative for dysphoric mood and sleep disturbance. The patient is not nervous/anxious.     Patient Active Problem List   Diagnosis Date Noted   Vertigo 09/30/2022   Gastroesophageal reflux disease 09/30/2022   Generalized anxiety disorder  05/05/2022   Paroxysmal atrial fibrillation (HCC) 03/29/2022   Acquired thrombophilia (HCC) 03/18/2022   Chronic diastolic heart failure (HCC) 03/18/2022   Prediabetes 12/16/2021   CAD (coronary artery disease), native coronary artery 12/06/2021   Lymphedema of left arm 09/23/2021   Nevus of neck 09/23/2021   Peroneal tendinitis, left 09/30/2020   OSA on CPAP 07/12/2020   Breast cyst, right 05/08/2020   Status post hysterectomy 04/03/2020   Colon cancer screening 07/13/2019   Multiple thyroid nodules 08/23/2018   Mixed hyperlipidemia 07/06/2018   Microscopic hematuria 05/04/2018   Liver hemangioma 03/26/2017   Panic disorder 06/15/2016   Macroglossia 02/05/2016   Environmental and seasonal allergies 01/02/2016   Vitamin D deficiency 04/19/2015   Cervical radiculopathy 04/17/2015   Essential hypertension 04/17/2015   History of partial thyroidectomy 04/17/2015   Obesity, Class II, BMI 35-39.9 04/17/2015   Lipoma of axilla 10/24/2014   History of left breast cancer 09/02/2010    Allergies  Allergen Reactions   Ace Inhibitors Swelling   Sernivo [Betamethasone Dipropionate] Itching   Penicillins Rash    Has patient had a PCN reaction causing immediate rash, facial/tongue/throat swelling, SOB or lightheadedness with hypotension: Yes Has patient had a PCN reaction causing severe rash involving mucus membranes or skin necrosis: No Has patient had a PCN reaction that required hospitalization: No Has patient had a PCN reaction occurring within the last 10 years: No If all of the above answers are "NO", then may proceed with Cephalosporin use.    Past Surgical History:  Procedure Laterality Date   ABDOMINAL HYSTERECTOMY  2011   ovaries remain   ATRIAL FIBRILLATION ABLATION N/A 01/12/2022   Procedure: ATRIAL FIBRILLATION ABLATION;  Surgeon: Lanier Prude, MD;  Location: [facility] INVASIVE [facility];  Service: Cardiovascular;  Laterality: N/A;   birth mark removed     BREAST CYST  ASPIRATION Right    negative 01/2010   BREAST EXCISIONAL BIOPSY  08/05/2010   left breast positive 07/2010   BREAST LUMPECTOMY  2012   left breast   COLONOSCOPY  07/29/2012   normal study.   LEFT HEART CATH AND CORONARY ANGIOGRAPHY N/A 12/08/2021   Procedure: LEFT HEART CATH AND CORONARY ANGIOGRAPHY;  Surgeon: Iran Ouch, MD;  Location: [facility] INVASIVE CV LAB;  Service: Cardiovascular;  Laterality: N/A;   robotic tonsillectomy Lingual tonsils Bilateral 06/08/2016   THYROID SURGERY     partially removed   TUBAL LIGATION      Social History   Tobacco Use   Smoking status: Never   Smokeless tobacco: Never   Tobacco comments:    Never smoke 02/09/22  Vaping Use   Vaping status: Never Used  Substance Use Topics   Alcohol use: Not Currently    Comment: occ   Drug use: Never     Medication list has been reviewed and updated.  Current Meds  Medication Sig   apixaban (ELIQUIS) 5 MG TABS tablet Take 1 tablet (5 mg total) by mouth 2 (two) times daily.   B Complex-Biotin-FA (B-COMPLEX PO) Take 1 tablet by mouth daily.   famotidine (PEPCID) 40 MG tablet Take 1 tablet (40 mg total) by mouth daily.   furosemide (  LASIX) 20 MG tablet TAKE 1 TABLET (20 MG TOTAL) BY MOUTH DAILY. INCREASE TO 1 TABLET (20 MG TOTAL) BY MOUTH TWICE DAILY (TOTAL DAILY DOSE 40 MG) AS NEEDED FOR UP TO 3 DAYS FOR INCREASED LEG SWELLING, SHORTNESS OF BREATH, WEIGHT GAIN 5+ LBS OVER 1-2 DAYS. SEEK MEDICAL CARE IF THESE SYMPTOMS ARE NOT IMPROVING WITH INCREASED DOSE.   levothyroxine (SYNTHROID) 75 MCG tablet Take 75 mcg by mouth daily.   metoprolol succinate (TOPROL-XL) 50 MG 24 hr tablet Take 1.5 tablets (75 mg total) by mouth 2 (two) times daily. Take with or immediately following a meal.   nitroGLYCERIN (NITROSTAT) 0.4 MG SL tablet Place 1 tablet (0.4 mg total) under the tongue every 5 (five) minutes as needed for chest pain.   potassium chloride SA (KLOR-CON M) 20 MEQ tablet Take 1 tablet (20 mEq total) by mouth  daily.   propafenone (RYTHMOL SR) 225 MG 12 hr capsule Take 225 mg by mouth 2 (two) times daily.   rosuvastatin (CRESTOR) 5 MG tablet Take 1 tablet (5 mg total) by mouth daily.   [DISCONTINUED] LORazepam (ATIVAN) 0.5 MG tablet Take 1 tablet (0.5 mg total) by mouth every 8 (eight) hours as needed. for anxiety       09/30/2022    9:57 AM 06/18/2022    8:49 AM 05/05/2022    1:44 PM 03/18/2022    2:54 PM  GAD 7 : Generalized Anxiety Score  Nervous, Anxious, on Edge 0 0 2 1  Control/stop worrying 0 0 2 0  Worry too much - different things 0 0 2 1  Trouble relaxing 1 2 1  0  Restless 1 2 0 1  Easily annoyed or irritable 1 0 1 1  Afraid - awful might happen 0 0 1 0  Total GAD 7 Score 3 4 9 4   Anxiety Difficulty Not difficult at all Not difficult at all Somewhat difficult Somewhat difficult       09/30/2022    9:57 AM 06/18/2022    8:49 AM 05/05/2022    1:43 PM  Depression screen PHQ 2/9  Decreased Interest 0 1 1  Down, Depressed, Hopeless 0 1 2  PHQ - 2 Score 0 2 3  Altered sleeping 2 2 3   Tired, decreased energy 2 2 2   Change in appetite 1 3 2   Feeling bad or failure about yourself  0 1 2  Trouble concentrating 1 1 1   Moving slowly or fidgety/restless 1 0 2  Suicidal thoughts 0 0 0  PHQ-9 Score 7 11 15   Difficult doing work/chores Not difficult at all Somewhat difficult Somewhat difficult    BP Readings from Last 3 Encounters:  09/30/22 128/78  09/21/22 (!) 146/82  08/20/22 (!) 138/98    Physical Exam Vitals and nursing note reviewed.  Constitutional:      General: She is not in acute distress.    Appearance: She is well-developed.  HENT:     Head: Normocephalic and atraumatic.     Right Ear: Tympanic membrane and ear canal normal.     Left Ear: Tympanic membrane and ear canal normal.     Nose:     Right Sinus: No maxillary sinus tenderness.     Left Sinus: No maxillary sinus tenderness.  Eyes:     General: No scleral icterus.       Right eye: No discharge.         Left eye: No discharge.     Extraocular Movements: Extraocular movements intact.  Conjunctiva/sclera: Conjunctivae normal.  Neck:     Thyroid: No thyromegaly.     Vascular: No carotid bruit.  Cardiovascular:     Rate and Rhythm: Normal rate and regular rhythm.     Pulses: Normal pulses.     Heart sounds: Normal heart sounds.  Pulmonary:     Effort: Pulmonary effort is normal. No respiratory distress.     Breath sounds: No wheezing.  Chest:  Breasts:    Right: No mass, nipple discharge, skin change or tenderness.     Left: Skin change present. No mass, nipple discharge or tenderness.       Comments: Post surgical changes on left Abdominal:     General: Bowel sounds are normal.     Palpations: Abdomen is soft.     Tenderness: There is no abdominal tenderness.  Musculoskeletal:     Cervical back: Normal range of motion. No erythema.     Right lower leg: No edema.     Left lower leg: No edema.  Lymphadenopathy:     Cervical: No cervical adenopathy.  Skin:    General: Skin is warm and dry.     Findings: No rash.  Neurological:     Mental Status: She is alert and oriented to person, place, and time.     Cranial Nerves: No cranial nerve deficit.     Sensory: No sensory deficit.     Deep Tendon Reflexes: Reflexes are normal and symmetric.  Psychiatric:        Attention and Perception: Attention normal.        Mood and Affect: Mood normal.     Wt Readings from Last 3 Encounters:  09/30/22 267 lb (121.1 kg)  09/21/22 263 lb (119.3 kg)  08/20/22 266 lb 12.8 oz (121 kg)    BP 128/78 (BP Location: Right Arm, Cuff Size: Large)   Pulse 68   Ht 5\' 6"  (1.676 m)   Wt 267 lb (121.1 kg)   LMP 04/01/2008   SpO2 95%   BMI 43.09 kg/m   Assessment and Plan:  Problem List Items Addressed This Visit       Unprioritized   Vertigo    symptoms becoming more symptomatic. No clear cause but pt is unsteady and worried about falls rec ENT evaluation - established Dr. Elenore Rota        Prediabetes    Blood sugar managed with diet.      Relevant Orders   Hemoglobin A1c   Multiple thyroid nodules (Chronic)    Supplemented.  Followed by Dr. Elenore Rota. Lab Results  Component Value Date   TSH 1.140 12/26/2021         Mixed hyperlipidemia (Chronic)    Has not tolerated Zetia or Lipitor well in the past; she is taking Lipitor currently Will check labs and change Rx to Crestor 5 mg.      Relevant Medications   rosuvastatin (CRESTOR) 5 MG tablet   Other Relevant Orders   Lipid panel   Generalized anxiety disorder   Relevant Medications   LORazepam (ATIVAN) 0.5 MG tablet   Gastroesophageal reflux disease    Reflux symptoms are occurring daily.  She uses baking soda as needed. She is elevating her bed.  We discussed other antireflux measures. Will start Pepcid daily. No red flag signs such as weight loss, n/v, melena       Relevant Medications   famotidine (PEPCID) 40 MG tablet   Other Relevant Orders   CBC with Differential/Platelet   Essential  hypertension (Chronic)    Normal exam with stable BP on metoprolol and lasix. No concerns or side effects to current medication. No change in regimen; continue low sodium diet.       Relevant Medications   rosuvastatin (CRESTOR) 5 MG tablet   Other Relevant Orders   CBC with Differential/Platelet   Comprehensive metabolic panel   Colon cancer screening   Relevant Orders   Ambulatory referral to Gastroenterology   Other Visit Diagnoses     Annual physical exam    -  Primary   Relevant Orders   CBC with Differential/Platelet   Comprehensive metabolic panel   Lipid panel   Hemoglobin A1c   Encounter for screening mammogram for breast cancer       Bilat mammo previously ordered pt will call to schedule       Return in about 4 months (around 01/30/2023) for Lipids - fasting, gerd.    Reubin Milan, MD [facility] [place]

## 2022-10-02 ENCOUNTER — Telehealth: Payer: Self-pay | Admitting: Cardiology

## 2022-10-02 DIAGNOSIS — I480 Paroxysmal atrial fibrillation: Secondary | ICD-10-CM

## 2022-10-02 MED ORDER — APIXABAN 5 MG PO TABS
5.0000 mg | ORAL_TABLET | Freq: Two times a day (BID) | ORAL | 1 refills | Status: DC
Start: 2022-10-02 — End: 2023-04-27

## 2022-10-02 NOTE — Telephone Encounter (Signed)
Prescription refill request for Eliquis received. Indication: Afib  Last office visit: 08/20/22 Lalla Brothers)  Scr: 0.97 (09/30/22)  Age: 62 Weight: 121.1kg  Appropriate dose. Refill sent.

## 2022-10-02 NOTE — Telephone Encounter (Signed)
. *  STAT* If patient is at the pharmacy, call can be transferred to refill team.   1. Which medications need to be refilled? (please list name of each medication and dose if known)  new prescription forEliquis   2. Would you like to learn more about the convenience, safety, & potential cost savings by using the [facility]?    3. Are you open to using the [facility] (Type [facility].    4. Which pharmacy/location (including street and city if local pharmacy) is medication to be sent to? [facility] [place]   5. Do they need a 30 day or 90 day supply? 90 days and refills- please call today, do not have any for tomorrow

## 2022-10-06 ENCOUNTER — Other Ambulatory Visit: Payer: Self-pay | Admitting: Internal Medicine

## 2022-10-06 ENCOUNTER — Other Ambulatory Visit (HOSPITAL_BASED_OUTPATIENT_CLINIC_OR_DEPARTMENT_OTHER): Payer: Self-pay | Admitting: Internal Medicine

## 2022-10-06 NOTE — Telephone Encounter (Signed)
Medication Refill - Medication: furosemide (LASIX) 20 MG tablet  Pt has one pill left  Has the patient contacted their pharmacy? Yes  (Agent: If no, request that the patient contact the pharmacy for the refill. If patient does not wish to contact the pharmacy document the reason why and proceed with request.) (Agent: If yes, when and what did the pharmacy advise?)contact pcp  Preferred Pharmacy (with phone number or street name): [id number] [place] [place] - [address] [place] [address] Phone: [phone]  Fax: [phone]  Has the patient been seen for an appointment in the last year OR does the patient have an upcoming appointment? yes  Agent: Please be advised that RX refills may take up to 3 business days. We ask that you follow-up with your pharmacy.

## 2022-10-07 ENCOUNTER — Other Ambulatory Visit: Payer: Self-pay | Admitting: Internal Medicine

## 2022-10-07 ENCOUNTER — Telehealth: Payer: Self-pay | Admitting: Internal Medicine

## 2022-10-07 ENCOUNTER — Encounter: Payer: Self-pay | Admitting: *Deleted

## 2022-10-07 MED ORDER — FUROSEMIDE 20 MG PO TABS: 20.0000 mg | ORAL_TABLET | Freq: Every day | ORAL | 1 refills | Status: DC

## 2022-10-07 NOTE — Telephone Encounter (Signed)
Copied from CRM [id number]. Topic: General - Other >> Oct 07, 2022  9:47 AM Franchot Heidelberg wrote: Reason for CRM: Pt called back to report that she is completely out of her current supply of Furosemide. Pt says she will reach out to the hospital if PCP is not willing to fill this soon enough.

## 2022-10-21 ENCOUNTER — Ambulatory Visit (INDEPENDENT_AMBULATORY_CARE_PROVIDER_SITE_OTHER): Payer: BC Managed Care – PPO | Admitting: Internal Medicine

## 2022-10-21 ENCOUNTER — Encounter (INDEPENDENT_AMBULATORY_CARE_PROVIDER_SITE_OTHER): Payer: Self-pay | Admitting: Internal Medicine

## 2022-10-21 VITALS — BP 138/75 | HR 65 | Temp 98.2°F | Ht 66.0 in | Wt 263.0 lb

## 2022-10-21 DIAGNOSIS — Z6841 Body Mass Index (BMI) 40.0 and over, adult: Secondary | ICD-10-CM

## 2022-10-21 DIAGNOSIS — R7303 Prediabetes: Secondary | ICD-10-CM | POA: Diagnosis not present

## 2022-10-21 DIAGNOSIS — R5383 Other fatigue: Secondary | ICD-10-CM | POA: Diagnosis not present

## 2022-10-21 DIAGNOSIS — E559 Vitamin D deficiency, unspecified: Secondary | ICD-10-CM | POA: Diagnosis not present

## 2022-10-21 DIAGNOSIS — R0602 Shortness of breath: Secondary | ICD-10-CM | POA: Diagnosis not present

## 2022-10-21 DIAGNOSIS — E7800 Pure hypercholesterolemia, unspecified: Secondary | ICD-10-CM

## 2022-10-21 DIAGNOSIS — K760 Fatty (change of) liver, not elsewhere classified: Secondary | ICD-10-CM

## 2022-10-21 DIAGNOSIS — R948 Abnormal results of function studies of other organs and systems: Secondary | ICD-10-CM

## 2022-10-21 DIAGNOSIS — E66813 Obesity, class 3: Secondary | ICD-10-CM

## 2022-10-21 DIAGNOSIS — G4733 Obstructive sleep apnea (adult) (pediatric): Secondary | ICD-10-CM

## 2022-10-21 DIAGNOSIS — Z1331 Encounter for screening for depression: Secondary | ICD-10-CM

## 2022-10-21 NOTE — Assessment & Plan Note (Signed)
Patient has a slower than predicted metabolism. IC 1814 vs. calculated 2017. This may contribute to weight gain, chronic fatigue and difficulty losing weight.  We reviewed measures to improve metabolism including not skipping meals, progressive strengthening exercises, increasing protein intake at every meal and maintaining adequate hydration and sleep.

## 2022-10-21 NOTE — Assessment & Plan Note (Signed)
LDL is not at goal. Elevated LDL may be secondary to nutrition, genetics and spillover effect from excess adiposity. Recommended LDL goal is <70 to reduce the risk of fatty streaks and the progression to obstructive ASCVD in the future.   She is currently on rosuvastatin 5 mg a day  Her 10 year risk is: The ASCVD Risk score (Arnett DK, et al., 2019) failed to calculate for the following reasons:   The patient has a prior MI or stroke diagnosis  Lab Results  Component Value Date   CHOL 247 (H) 09/30/2022   HDL 46 09/30/2022   LDLCALC 186 (H) 09/30/2022   TRIG 87 09/30/2022   CHOLHDL 5.4 (H) 09/30/2022    Continue weight loss therapy, losing 10% or more of body weight may improve condition. Also advised to reduce saturated fats in diet to less than 10% of daily calories.    Patient is currently on statin therapy but she feels that medications do not agree with her.  She is vitamin D deficient which may decreased tolerance and increased risk of myalgias.  Her LDL cholesterol is really high this is suspicious for familial hypercholesterolemia.  She will likely need intensification of lipid-lowering therapies.  She will continue to work with her primary care team.  We are checking vitamin D levels and replenishing if needed.

## 2022-10-21 NOTE — Assessment & Plan Note (Signed)
Most recent vitamin D levels  Lab Results  Component Value Date   VD25OH 20.0 (L) 07/01/2016   VD25OH 30.5 08/21/2015   VD25OH 12.2 (L) 04/17/2015     Deficiency state associated with adiposity and may result in leptin resistance, weight gain and fatigue.   Plan: Check vitamin D levels today.

## 2022-10-21 NOTE — Assessment & Plan Note (Signed)
On CPAP with reported good compliance. Continue PAP therapy. Losing 15% or more of body weight may improve AHI.   Provided with handout information about OSA and association with weight.

## 2022-10-21 NOTE — Assessment & Plan Note (Signed)
Most recent A1c is  Lab Results  Component Value Date   HGBA1C 6.2 (H) 09/30/2022   HGBA1C 5.8 (H) 07/11/2019    Patient aware of disease state and risk of progression. This may contribute to abnormal cravings, fatigue and diabetic complications without having diabetes.   We have discussed treatment options which include: losing 7 to 10% of body weight, increasing physical activity to a goal of 150 minutes a week at moderate intensity.  Advised to maintain a diet low on simple and processed carbohydrates.  She may also be a candidate for pharmacoprophylaxis with metformin or incretin mimetic.

## 2022-10-21 NOTE — Assessment & Plan Note (Signed)
Detected on ultrasound in 2013.  Most recent liver enzymes are within normal limits.  We will assess risk for hepatic disease further at the next office visit.

## 2022-10-21 NOTE — Progress Notes (Signed)
Chief Complaint:   OBESITY Heather Dudley (MR# [id number]) is a 62 y.o. female who presents for evaluation and treatment of obesity and related comorbidities. Current BMI is Body mass index is 42.45 kg/m. Juan has been struggling with her weight for many years and has been unsuccessful in either losing weight, maintaining weight loss, or reaching her healthy weight goal.  Keagen is currently in the action stage of change and ready to dedicate time achieving and maintaining a healthier weight. Bretney is interested in becoming our patient and working on intensive lifestyle modifications including (but not limited to) diet and exercise for weight loss.  Mardy's habits were reviewed today and are as follows: Her family eats meals together, she thinks her family will eat healthier with her, her desired weight loss is 63 lbs, she started gaining weight at 62 years old, her heaviest weight ever was 265 pounds, she has significant food cravings issues, she snacks frequently in the evenings, she skips meals frequently, she is frequently drinking liquids with calories, she frequently makes poor food choices, she frequently eats larger portions than normal, and she struggles with emotional eating.  Depression Screen Adria's Food and Mood (modified PHQ-9) score was 5.  Subjective:   1. Other fatigue Blondina admits to daytime somnolence and admits to waking up still tired. Patient has a history of symptoms of daytime fatigue, morning fatigue, and morning headache. Kiyanah generally gets 6 hours of sleep per night, and states that she has nightime awakenings. Snoring is present. Apneic episodes are present. Epworth Sleepiness Score is 10.   2. SOB (shortness of breath) on exertion Victorino Dike notes increasing shortness of breath with exercising and seems to be worsening over time with weight gain. She notes getting out of breath sooner with activity than she used to. This has not  gotten worse recently. Chriss denies shortness of breath at rest or orthopnea.  3. Pure hypercholesterolemia LDL is not at goal. Elevated LDL may be secondary to nutrition, genetics and spillover effect from excess adiposity. Recommended LDL goal is <70 to reduce the risk of fatty streaks and the progression to obstructive ASCVD in the future.  She is currently on rosuvastatin 5 mg a day.  Her 10 year risk is: The ASCVD Risk score (Arnett DK, et al., 2019) failed to calculate for the following reasons:   The patient has a prior MI or stroke diagnosis.  Lab Results  Component Value Date   CHOL 247 (H) 09/30/2022   HDL 46 09/30/2022   LDLCALC 186 (H) 09/30/2022   TRIG 87 09/30/2022   CHOLHDL 5.4 (H) 09/30/2022   4. Vitamin D deficiency Deficiency state associated with adiposity and may result in leptin resistance, weight gain and fatigue.   Most recent vitamin D levels  Lab Results  Component Value Date   VD25OH 20.0 (L) 07/01/2016   VD25OH 30.5 08/21/2015   VD25OH 12.2 (L) 04/17/2015   5. Prediabetes Patient aware of disease state and risk of progression. This may contribute to abnormal cravings, fatigue and diabetic complications without having diabetes.   Most recent A1c is  Lab Results  Component Value Date   HGBA1C 6.2 (H) 09/30/2022   HGBA1C 5.8 (H) 07/11/2019   6. Hepatic steatosis Detected on ultrasound in 2013.  Most recent liver enzymes are within normal limits.  7. Abnormal metabolism Patient has a slower than predicted metabolism. IC 1814 vs. calculated 2017. This may contribute to weight gain, chronic fatigue and difficulty losing weight.  8. OSA on CPAP On CPAP with reported good compliance.   Assessment/Plan:   1. Other fatigue Brittinee does feel that her weight is causing her energy to be lower than it should be. Fatigue may be related to obesity, depression or many other causes. Labs will be ordered, and in the meanwhile, Elleny will focus on self  care including making healthy food choices, increasing physical activity and focusing on stress reduction.  - Vitamin B12 - Magnesium  2. SOB (shortness of breath) on exertion Geniyah does feel that she gets out of breath more easily that she used to when she exercises. Maisie's shortness of breath appears to be obesity related and exercise induced. She has agreed to work on weight loss and gradually increase exercise to treat her exercise induced shortness of breath. Will continue to monitor closely.  3. Pure hypercholesterolemia Continue weight loss therapy, losing 10% or more of body weight may improve condition. Also advised to reduce saturated fats in diet to less than 10% of daily calories.    Patient is currently on statin therapy but she feels that medications do not agree with her.  She is vitamin D deficient which may decreased tolerance and increased risk of myalgias.  Her LDL cholesterol is really high this is suspicious for familial hypercholesterolemia.  She will likely need intensification of lipid-lowering therapies.  She will continue to work with her primary care team.  We are checking vitamin D levels and replenishing if needed.  4. Vitamin D deficiency Check vitamin D levels today.  - VITAMIN D 25 Hydroxy (Vit-D Deficiency, Fractures)  5. Prediabetes We have discussed treatment options which include: losing 7 to 10% of body weight, increasing physical activity to a goal of 150 minutes a week at moderate intensity.  Advised to maintain a diet low on simple and processed carbohydrates.  She may also be a candidate for pharmacoprophylaxis with metformin or incretin mimetic.     - Insulin, random  6. Hepatic steatosis We will assess risk for hepatic disease further at the next office visit.  7. Abnormal metabolism We reviewed measures to improve metabolism including not skipping meals, progressive strengthening exercises, increasing protein intake at every meal and  maintaining adequate hydration and sleep.   8. OSA on CPAP Continue PAP therapy. Losing 15% or more of body weight may improve AHI.  Provided with handout information about OSA and association with weight.  9. Depression screen Salvador had a positive depression screening. Depression is commonly associated with obesity and often results in emotional eating behaviors. We will monitor this closely and work on CBT to help improve the non-hunger eating patterns. Referral to Psychology may be required if no improvement is seen as she continues in our clinic.  10. Class 3 severe obesity with serious comorbidity and body mass index (BMI) of 40.0 to 44.9 in adult, unspecified obesity type (HCC) Landa is currently in the action stage of change and her goal is to continue with weight loss efforts. I recommend Donalda begin the structured treatment plan as follows:  She has agreed to the Category 3 Plan.  Exercise goals: All adults should avoid inactivity. Some physical activity is better than none, and adults who participate in any amount of physical activity gain some health benefits.   Behavioral modification strategies: increasing lean protein intake, decreasing simple carbohydrates, increasing vegetables, increasing water intake, decreasing liquid calories, increasing high fiber foods, no skipping meals, meal planning and cooking strategies, keeping healthy foods in the home, better  snacking choices, avoiding temptations, and planning for success.  She was informed of the importance of frequent follow-up visits to maximize her success with intensive lifestyle modifications for her multiple health conditions. She was informed we would discuss her lab results at her next visit unless there is a critical issue that needs to be addressed sooner. Tenna agreed to keep her next visit at the agreed upon time to discuss these results.  Objective:   Blood pressure 138/75, pulse 65, temperature 98.2 F  (36.8 C), height 5\' 6"  (1.676 m), weight 263 lb (119.3 kg), last menstrual period 04/01/2008, SpO2 98%. Body mass index is 42.45 kg/m.  EKG: Normal sinus rhythm, rate 102 BPM.  Indirect Calorimeter completed today shows a VO2 of 264 and a REE of 1814.  Her calculated basal metabolic rate is 1610 thus her basal metabolic rate is worse than expected.  General: Cooperative, alert, well developed, in no acute distress. HEENT: Conjunctivae and lids unremarkable. Cardiovascular: Regular rhythm.  Lungs: Normal work of breathing. Neurologic: No focal deficits.   Lab Results  Component Value Date   CREATININE 0.97 09/30/2022   BUN 12 09/30/2022   NA 138 09/30/2022   K 4.2 09/30/2022   CL 101 09/30/2022   CO2 23 09/30/2022   Lab Results  Component Value Date   ALT 11 09/30/2022   AST 18 09/30/2022   ALKPHOS 96 09/30/2022   BILITOT 0.4 09/30/2022   Lab Results  Component Value Date   HGBA1C 6.2 (H) 09/30/2022   HGBA1C 6.3 (A) 03/18/2022   HGBA1C 6.1 (H) 07/15/2021   HGBA1C 5.9 (H) 07/12/2020   HGBA1C 5.8 (H) 07/11/2019   No results found for: "INSULIN" Lab Results  Component Value Date   TSH 1.140 12/26/2021   Lab Results  Component Value Date   CHOL 247 (H) 09/30/2022   HDL 46 09/30/2022   LDLCALC 186 (H) 09/30/2022   TRIG 87 09/30/2022   CHOLHDL 5.4 (H) 09/30/2022   Lab Results  Component Value Date   WBC 8.3 09/30/2022   HGB 12.8 09/30/2022   HCT 39.4 09/30/2022   MCV 84 09/30/2022   PLT 293 09/30/2022   No results found for: "IRON", "TIBC", "FERRITIN"  Attestation Statements:   Reviewed by clinician on day of visit: allergies, medications, problem list, medical history, surgical history, family history, social history, and previous encounter notes.  Time spent on visit including pre-visit chart review and post-visit charting and care was 40 minutes.   Trude Mcburney, am acting as transcriptionist for Worthy Rancher, MD.  I have reviewed the above  documentation for accuracy and completeness, and I agree with the above. -Worthy Rancher, MD

## 2022-10-22 LAB — VITAMIN D 25 HYDROXY (VIT D DEFICIENCY, FRACTURES): Vit D, 25-Hydroxy: 13.2 ng/mL — ABNORMAL LOW (ref 30.0–100.0)

## 2022-10-22 LAB — VITAMIN B12: Vitamin B-12: 943 pg/mL (ref 232–1245)

## 2022-10-22 LAB — INSULIN, RANDOM: INSULIN: 19.6 u[IU]/mL (ref 2.6–24.9)

## 2022-10-23 DIAGNOSIS — I10 Essential (primary) hypertension: Secondary | ICD-10-CM | POA: Diagnosis not present

## 2022-10-23 DIAGNOSIS — G4733 Obstructive sleep apnea (adult) (pediatric): Secondary | ICD-10-CM | POA: Diagnosis not present

## 2022-10-29 ENCOUNTER — Telehealth: Payer: Self-pay

## 2022-10-29 NOTE — Telephone Encounter (Signed)
 Pt requested call back to schedule colonoscopy

## 2022-10-29 NOTE — Telephone Encounter (Signed)
Message left for patient to return my call.  

## 2022-11-02 ENCOUNTER — Telehealth: Payer: Self-pay

## 2022-11-02 ENCOUNTER — Telehealth: Payer: Self-pay | Admitting: *Deleted

## 2022-11-02 ENCOUNTER — Telehealth: Payer: Self-pay | Admitting: Cardiology

## 2022-11-02 ENCOUNTER — Other Ambulatory Visit: Payer: Self-pay | Admitting: *Deleted

## 2022-11-02 DIAGNOSIS — Z1211 Encounter for screening for malignant neoplasm of colon: Secondary | ICD-10-CM

## 2022-11-02 MED ORDER — NA SULFATE-K SULFATE-MG SULF 17.5-3.13-1.6 GM/177ML PO SOLN: 1.0000 | Freq: Once | ORAL | 0 refills | Status: AC

## 2022-11-02 NOTE — Telephone Encounter (Signed)
Patient called to schedule a colonoscopy . °

## 2022-11-02 NOTE — Telephone Encounter (Signed)
Tried to call the pt to set up tele pre op appt, though no answer

## 2022-11-02 NOTE — Telephone Encounter (Signed)
   Name: Heather Dudley  DOB: 1960/03/13  MRN: [id number]  Primary Cardiologist: Julien Nordmann, MD   Preoperative team, please contact this patient and set up a phone call appointment for further preoperative risk assessment. Please obtain consent and complete medication review. Thank you for your help.  I confirm that guidance regarding antiplatelet and oral anticoagulation therapy has been completed and, if necessary, noted below.   Joni Reining, NP 11/02/2022, 4:20 PM [facility]

## 2022-11-02 NOTE — Telephone Encounter (Signed)
   Pre-operative Risk Assessment    Patient Name: Heather Dudley  DOB: 1961-01-06 MRN: [id number]{       Request for Surgical Clearance    Procedure:   COLONOSCOPY  Date of Surgery:  Clearance 12/07/22                               Surgeon:  NOT LISTED Surgeon's Group or Practice Name:  [facility] Phone number:  [phone] Fax number:  [phone]  Type of Clearance Requested:   - Medical    Type of Anesthesia:  General    Additional requests/questions:    SignedDalia Heading   11/02/2022, 2:57 PM

## 2022-11-02 NOTE — Telephone Encounter (Signed)
Colonoscopy schedule on 12/07/2022 with Dr Servando Snare in [facility]

## 2022-11-03 ENCOUNTER — Telehealth: Payer: Self-pay | Admitting: *Deleted

## 2022-11-03 NOTE — Telephone Encounter (Signed)
We had to change location for patient due to heart issues. We have reschedule patient to 12/10/2022 at [facility] but still with Dr Servando Snare

## 2022-11-03 NOTE — Telephone Encounter (Signed)
Gastroenterology Pre-Procedure Review  Request Date: 12/10/2022 Requesting Physician: Dr. Servando Snare  PATIENT REVIEW QUESTIONS: The patient responded to the following health history questions as indicated:    1. Are you having any GI issues? no 2. Do you have a personal history of Polyps? no 3. Do you have a family history of Colon Cancer or Polyps? no 4. Diabetes Mellitus? no 5. Joint replacements in the past 12 months?no 6. Major health problems in the past 3 months?no 7. Any artificial heart valves, MVP, or defibrillator?no    MEDICATIONS & ALLERGIES:    Patient reports the following regarding taking any anticoagulation/antiplatelet therapy:   Plavix, Coumadin, Eliquis, Xarelto, Lovenox, Pradaxa, Brilinta, or Effient? yes (Eliquis for Paroxysmal atrial fibrillation ) Aspirin? no  Patient confirms/reports the following medications:  Current Outpatient Medications  Medication Sig Dispense Refill   apixaban (ELIQUIS) 5 MG TABS tablet Take 1 tablet (5 mg total) by mouth 2 (two) times daily. 180 tablet 1   B Complex-Biotin-FA (B-COMPLEX PO) Take 1 tablet by mouth daily.     famotidine (PEPCID) 40 MG tablet Take 1 tablet (40 mg total) by mouth daily. 90 tablet 1   furosemide (LASIX) 20 MG tablet Take 1 tablet (20 mg total) by mouth daily. 90 tablet 1   levothyroxine (SYNTHROID) 75 MCG tablet Take 75 mcg by mouth daily.     LORazepam (ATIVAN) 0.5 MG tablet Take 1 tablet (0.5 mg total) by mouth every 8 (eight) hours as needed. for anxiety 20 tablet 0   metoprolol succinate (TOPROL-XL) 50 MG 24 hr tablet Take 1.5 tablets (75 mg total) by mouth 2 (two) times daily. Take with or immediately following a meal. 270 tablet 3   nitroGLYCERIN (NITROSTAT) 0.4 MG SL tablet Place 1 tablet (0.4 mg total) under the tongue every 5 (five) minutes as needed for chest pain. 30 tablet 0   potassium chloride SA (KLOR-CON M) 20 MEQ tablet Take 1 tablet (20 mEq total) by mouth daily. 90 tablet 3   propafenone  (RYTHMOL SR) 225 MG 12 hr capsule Take 225 mg by mouth 2 (two) times daily.     rosuvastatin (CRESTOR) 5 MG tablet Take 1 tablet (5 mg total) by mouth daily. 90 tablet 1   No current facility-administered medications for this visit.    Patient confirms/reports the following allergies:  Allergies  Allergen Reactions   Ace Inhibitors Swelling   Sernivo [Betamethasone Dipropionate] Itching   Penicillins Rash    Has patient had a PCN reaction causing immediate rash, facial/tongue/throat swelling, SOB or lightheadedness with hypotension: Yes Has patient had a PCN reaction causing severe rash involving mucus membranes or skin necrosis: No Has patient had a PCN reaction that required hospitalization: No Has patient had a PCN reaction occurring within the last 10 years: No If all of the above answers are "NO", then may proceed with Cephalosporin use.    No orders of the defined types were placed in this encounter.   AUTHORIZATION INFORMATION Primary Insurance: 1D#: Group #:  Secondary Insurance: 1D#: Group #:  SCHEDULE INFORMATION: Date: 12/10/2022 Time: Location:  [facility]

## 2022-11-03 NOTE — Telephone Encounter (Signed)
Pt has been scheduled for tele pre op appt 11/23/22 @ 10 am. Med rec and consent are done. Pt states she feels like she has some issues with some of her medications that she was taking at bedtime and causing her a-fib to flare up. I advised her to d/w her doctors who handle the medications she is taking about as for further advice and recommendations.     Patient Consent for Virtual Visit        Heather Dudley has provided verbal consent on 11/03/2022 for a virtual visit (video or telephone).   CONSENT FOR VIRTUAL VISIT FOR:  Heather Dudley  By participating in this virtual visit I agree to the following:  I hereby voluntarily request, consent and authorize [facility] and its employed or contracted physicians, physician assistants, nurse practitioners or other licensed health care professionals (the Practitioner), to provide me with telemedicine health care services (the "Services") as deemed necessary by the treating Practitioner. I acknowledge and consent to receive the Services by the Practitioner via telemedicine. I understand that the telemedicine visit will involve communicating with the Practitioner through live audiovisual communication technology and the disclosure of certain medical information by electronic transmission. I acknowledge that I have been given the opportunity to request an in-person assessment or other available alternative prior to the telemedicine visit and am voluntarily participating in the telemedicine visit.  I understand that I have the right to withhold or withdraw my consent to the use of telemedicine in the course of my care at any time, without affecting my right to future care or treatment, and that the Practitioner or I may terminate the telemedicine visit at any time. I understand that I have the right to inspect all information obtained and/or recorded in the course of the telemedicine visit and may receive copies of available information  for a reasonable fee.  I understand that some of the potential risks of receiving the Services via telemedicine include:  Delay or interruption in medical evaluation due to technological equipment failure or disruption; Information transmitted may not be sufficient (e.g. poor resolution of images) to allow for appropriate medical decision making by the Practitioner; and/or  In rare instances, security protocols could fail, causing a breach of personal health information.  Furthermore, I acknowledge that it is my responsibility to provide information about my medical history, conditions and care that is complete and accurate to the best of my ability. I acknowledge that Practitioner's advice, recommendations, and/or decision may be based on factors not within their control, such as incomplete or inaccurate data provided by me or distortions of diagnostic images or specimens that may result from electronic transmissions. I understand that the practice of medicine is not an exact science and that Practitioner makes no warranties or guarantees regarding treatment outcomes. I acknowledge that a copy of this consent can be made available to me via my patient portal [facility] MyChart), or I can request a printed copy by calling the office of [facility].    I understand that my insurance will be billed for this visit.   I have read or had this consent read to me. I understand the contents of this consent, which adequately explains the benefits and risks of the Services being provided via telemedicine.  I have been provided ample opportunity to ask questions regarding this consent and the Services and have had my questions answered to my satisfaction. I give my informed consent for the services to be  provided through the use of telemedicine in my medical care

## 2022-11-03 NOTE — Telephone Encounter (Signed)
Pt has been scheduled for tele pre op appt 11/23/22 @ 10 am. Med rec and consent are done. Pt states she feels like she has some issues with some of her medications that she was taking at bedtime and causing her a-fib to flare up. I advised her to d/w her doctors who handle the medications she is taking about as for further advice and recommendations.

## 2022-11-04 ENCOUNTER — Encounter (INDEPENDENT_AMBULATORY_CARE_PROVIDER_SITE_OTHER): Payer: Self-pay | Admitting: Internal Medicine

## 2022-11-04 ENCOUNTER — Ambulatory Visit (INDEPENDENT_AMBULATORY_CARE_PROVIDER_SITE_OTHER): Payer: BC Managed Care – PPO | Admitting: Internal Medicine

## 2022-11-04 VITALS — BP 137/82 | HR 65 | Temp 98.4°F | Ht 66.0 in | Wt 262.0 lb

## 2022-11-04 DIAGNOSIS — E559 Vitamin D deficiency, unspecified: Secondary | ICD-10-CM | POA: Diagnosis not present

## 2022-11-04 DIAGNOSIS — R948 Abnormal results of function studies of other organs and systems: Secondary | ICD-10-CM

## 2022-11-04 DIAGNOSIS — R7303 Prediabetes: Secondary | ICD-10-CM

## 2022-11-04 DIAGNOSIS — G4733 Obstructive sleep apnea (adult) (pediatric): Secondary | ICD-10-CM

## 2022-11-04 DIAGNOSIS — E7800 Pure hypercholesterolemia, unspecified: Secondary | ICD-10-CM | POA: Diagnosis not present

## 2022-11-04 DIAGNOSIS — K760 Fatty (change of) liver, not elsewhere classified: Secondary | ICD-10-CM | POA: Diagnosis not present

## 2022-11-04 DIAGNOSIS — Z6841 Body Mass Index (BMI) 40.0 and over, adult: Secondary | ICD-10-CM

## 2022-11-04 MED ORDER — VITAMIN D (ERGOCALCIFEROL) 1.25 MG (50000 UNIT) PO CAPS
50000.0000 [IU] | ORAL_CAPSULE | ORAL | 0 refills | Status: DC
Start: 2022-11-04 — End: 2023-03-01

## 2022-11-04 NOTE — Assessment & Plan Note (Signed)
Patient has a slower than predicted metabolism. IC 1814 vs. calculated 2017. This may contribute to weight gain, chronic fatigue and difficulty losing weight.  We reviewed measures to improve metabolism including not skipping meals, progressive strengthening exercises, increasing protein intake at every meal and maintaining adequate hydration and sleep.

## 2022-11-04 NOTE — Assessment & Plan Note (Signed)
On CPAP with reported good compliance. Continue PAP therapy. Losing 15% or more of body weight may improve AHI.    

## 2022-11-04 NOTE — Assessment & Plan Note (Signed)
LDL is not at goal. Elevated LDL may be secondary to nutrition, genetics and spillover effect from excess adiposity. Recommended LDL goal is <70 to reduce the risk of fatty streaks and the progression to obstructive ASCVD in the future.    Her 10 year risk is: The ASCVD Risk score (Arnett DK, et al., 2019) failed to calculate for the following reasons:   The patient has a prior MI or stroke diagnosis  Lab Results  Component Value Date   CHOL 247 (H) 09/30/2022   HDL 46 09/30/2022   LDLCALC 186 (H) 09/30/2022   TRIG 87 09/30/2022   CHOLHDL 5.4 (H) 09/30/2022    Continue weight loss therapy, losing 10% or more of body weight may improve condition. Also advised to reduce saturated fats in diet to less than 10% of daily calories.    She is currently on rosuvastatin 5 mg a day but has been experiencing some neurological side effects which may be affecting adherence.   She has vitamin D def which may affect tolerance. Will start supplementation. Will discuss with PCP other non-statin lipid lowering therapies ie Zetia, Repatha  Her LDL cholesterol is really high this is suspicious for familial hypercholesterolemia.

## 2022-11-04 NOTE — Progress Notes (Signed)
Office: [phone]  /  Fax: [phone]  WEIGHT SUMMARY AND BIOMETRICS  Vitals Temp: 98.4 F (36.9 C) BP: 137/82 Pulse Rate: 65 SpO2: 100 %   Anthropometric Measurements Height: 5\' 6"  (1.676 m) Weight: 262 lb (118.8 kg) BMI (Calculated): 42.31 Weight at Last Visit: 263 lb Weight Lost Since Last Visit: 1 lb Weight Gained Since Last Visit: 0 lb Starting Weight: 263 lb Total Weight Loss (lbs): 1 lb (0.454 kg) Peak Weight: 263 lb   Body Composition  Body Fat %: 43.6 % Fat Mass (lbs): 114.2 lbs Muscle Mass (lbs): 140.4 lbs Total Body Water (lbs): 89.8 lbs Visceral Fat Rating : 15    RMR: 1814  Today's Visit #: 2  Starting Date: 10/21/22  The ASCVD Risk score (Arnett DK, et al., 2019) failed to calculate for the following reasons:   The patient has a prior MI or stroke diagnosis   HPI  Chief Complaint: OBESITY  Heather Dudley is here to discuss her progress with her obesity treatment plan. She is on the the Category 3 Plan and states she is following her eating plan approximately 30 % of the time. She states she is not exercising.  Interval History:  Since last office visit she has lost 1 pounds. She reports working on implementation of reduced calorie nutritional plan She has been working on reading food labels, not skipping meals, and eating more vegetables. Working on making healthier family dinners. She likes rice but has reduced portions.   Orexigenic Control: Denies problems with appetite and hunger signals.  Denies problems with satiety and satiation.  Denies problems with eating patterns and portion control.  Denies abnormal cravings. Denies feeling deprived or restricted.   Barriers identified: work schedule.   Pharmacotherapy for weight loss: She is currently taking no anti-obesity medication.    ASSESSMENT AND PLAN  TREATMENT PLAN FOR OBESITY:  Recommended Dietary Goals  Heather Dudley is currently in the action stage of change. As such, her goal  is to continue weight management plan. She has agreed to: continue current plan  Behavioral Intervention  We discussed the following Behavioral Modification Strategies today: increasing lean protein intake, decreasing simple carbohydrates , increasing vegetables, increasing lower glycemic fruits, increasing water intake, continue to practice mindfulness when eating, and planning for success.  Additional resources provided today: None  Recommended Physical Activity Goals  Heather Dudley has been advised to work up to 150 minutes of moderate intensity aerobic activity a week and strengthening exercises 2-3 times per week for cardiovascular health, weight loss maintenance and preservation of muscle mass.   She has agreed to :  Think about ways to increase daily physical activity and overcoming barriers to exercise and Increase physical activity in their day and reduce sedentary time (increase NEAT).  Pharmacotherapy We discussed various medication options to help Heather Dudley with her weight loss efforts and we both agreed to : continue with nutritional and behavioral strategies  ASSOCIATED CONDITIONS ADDRESSED TODAY  Pure hypercholesterolemia Assessment & Plan: LDL is not at goal. Elevated LDL may be secondary to nutrition, genetics and spillover effect from excess adiposity. Recommended LDL goal is <70 to reduce the risk of fatty streaks and the progression to obstructive ASCVD in the future.    Her 10 year risk is: The ASCVD Risk score (Arnett DK, et al., 2019) failed to calculate for the following reasons:   The patient has a prior MI or stroke diagnosis  Lab Results  Component Value Date   CHOL 247 (H) 09/30/2022   HDL  46 09/30/2022   LDLCALC 186 (H) 09/30/2022   TRIG 87 09/30/2022   CHOLHDL 5.4 (H) 09/30/2022    Continue weight loss therapy, losing 10% or more of body weight may improve condition. Also advised to reduce saturated fats in diet to less than 10% of daily calories.     She is currently on rosuvastatin 5 mg a day but has been experiencing some neurological side effects which may be affecting adherence.   She has vitamin D def which may affect tolerance. Will start supplementation. Will discuss with PCP other non-statin lipid lowering therapies ie Zetia, Repatha  Her LDL cholesterol is really high this is suspicious for familial hypercholesterolemia.    Prediabetes Assessment & Plan: Most recent A1c is  Lab Results  Component Value Date   HGBA1C 6.2 (H) 09/30/2022   HGBA1C 5.8 (H) 07/11/2019    Patient aware of disease state and risk of progression. This may contribute to abnormal cravings, fatigue and diabetic complications without having diabetes.   We have discussed treatment options which include: losing 7 to 10% of body weight, increasing physical activity to a goal of 150 minutes a week at moderate intensity.  Advised to maintain a diet low on simple and processed carbohydrates.  She may also be a candidate for pharmacoprophylaxis with metformin or incretin mimetic.     Vitamin D deficiency Assessment & Plan: Most recent vitamin D levels  Lab Results  Component Value Date   VD25OH 13.2 (L) 10/21/2022   VD25OH 20.0 (L) 07/01/2016   VD25OH 30.5 08/21/2015     Deficiency state associated with adiposity and may result in leptin resistance, weight gain and fatigue.   Plan: ergocalciferol 50, 000 units once a week for 4 months.    Orders: -     Vitamin D (Ergocalciferol); Take 1 capsule (50,000 Units total) by mouth every 7 (seven) days.  Dispense: 16 capsule; Refill: 0  Hepatic steatosis Assessment & Plan: Detected on ultrasound in 2013.  Most recent liver enzymes are within normal limits.   Fibrosis 4 Score = 1.11 (Low risk)        Interpretation for patients with NAFLD          <1.30       -  F0-F1 (Low risk)          1.30-2.67 -  Indeterminate           >2.67      -  F3-F4 (High risk)     Validated for ages 40-65   No  further work-up. Losing 15% may improve conditions also avoiding alcohol and reducing simple and processed carbs.   Abnormal metabolism Assessment & Plan: Patient has a slower than predicted metabolism. IC 1814 vs. calculated 2017. This may contribute to weight gain, chronic fatigue and difficulty losing weight.   We reviewed measures to improve metabolism including not skipping meals, progressive strengthening exercises, increasing protein intake at every meal and maintaining adequate hydration and sleep.     OSA on CPAP Assessment & Plan: On CPAP with reported good compliance. Continue PAP therapy. Losing 15% or more of body weight may improve AHI.      Class 3 severe obesity with serious comorbidity and body mass index (BMI) of 40.0 to 44.9 in adult, unspecified obesity type [facility]) Assessment & Plan: See obesity tx plan     PHYSICAL EXAM:  Blood pressure 137/82, pulse 65, temperature 98.4 F (36.9 C), height 5\' 6"  (1.676 m), weight 262 lb (118.8 kg),  last menstrual period 04/01/2008, SpO2 100%. Body mass index is 42.29 kg/m.  General: She is overweight, cooperative, alert, well developed, and in no acute distress. PSYCH: Has normal mood, affect and thought process.   HEENT: EOMI, sclerae are anicteric. Lungs: Normal breathing effort, no conversational dyspnea. Extremities: No edema.  Neurologic: No gross sensory or motor deficits. No tremors or fasciculations noted.    DIAGNOSTIC DATA REVIEWED:  BMET    Component Value Date/Time   NA 138 09/30/2022 1041   NA 137 02/22/2012 1443   K 4.2 09/30/2022 1041   K 3.6 09/19/2012 1011   CL 101 09/30/2022 1041   CL 101 02/22/2012 1443   CO2 23 09/30/2022 1041   CO2 27 02/22/2012 1443   GLUCOSE 96 09/30/2022 1041   GLUCOSE 113 (H) 12/27/2021 0600   GLUCOSE 102 (H) 02/22/2012 1443   BUN 12 09/30/2022 1041   BUN 13 02/22/2012 1443   CREATININE 0.97 09/30/2022 1041   CREATININE 1.09 11/02/2013 0903   CALCIUM 9.1  09/30/2022 1041   CALCIUM 9.0 02/22/2012 1443   GFRNONAA 53 (L) 12/27/2021 0600   GFRNONAA 59 (L) 11/02/2013 0903   GFRAA 68 07/11/2019 1143   GFRAA >60 11/02/2013 0903   Lab Results  Component Value Date   HGBA1C 6.2 (H) 09/30/2022   HGBA1C 5.8 (H) 07/11/2019   Lab Results  Component Value Date   INSULIN 19.6 10/21/2022   Lab Results  Component Value Date   TSH 1.140 12/26/2021   CBC    Component Value Date/Time   WBC 8.3 09/30/2022 1041   WBC 11.1 (H) 12/27/2021 0600   RBC 4.69 09/30/2022 1041   RBC 4.69 12/27/2021 0600   HGB 12.8 09/30/2022 1041   HCT 39.4 09/30/2022 1041   PLT 293 09/30/2022 1041   MCV 84 09/30/2022 1041   MCV 87 11/02/2013 0903   MCH 27.3 09/30/2022 1041   MCH 28.6 12/27/2021 0600   MCHC 32.5 09/30/2022 1041   MCHC 32.7 12/27/2021 0600   RDW 13.5 09/30/2022 1041   RDW 13.9 11/02/2013 0903   Iron Studies No results found for: "IRON", "TIBC", "FERRITIN", "IRONPCTSAT" Lipid Panel     Component Value Date/Time   CHOL 247 (H) 09/30/2022 1041   TRIG 87 09/30/2022 1041   HDL 46 09/30/2022 1041   CHOLHDL 5.4 (H) 09/30/2022 1041   LDLCALC 186 (H) 09/30/2022 1041   Hepatic Function Panel     Component Value Date/Time   PROT 6.9 09/30/2022 1041   PROT 7.6 11/02/2013 0903   ALBUMIN 4.1 09/30/2022 1041   ALBUMIN 3.5 11/02/2013 0903   AST 18 09/30/2022 1041   AST 18 11/02/2013 0903   ALT 11 09/30/2022 1041   ALT 20 11/02/2013 0903   ALKPHOS 96 09/30/2022 1041   ALKPHOS 97 11/02/2013 0903   BILITOT 0.4 09/30/2022 1041   BILITOT 0.4 11/02/2013 0903   BILIDIR <0.1 (L) 11/06/2015 1528   IBILI NOT CALCULATED 11/06/2015 1528      Component Value Date/Time   TSH 1.140 12/26/2021 1303   TSH 2.430 07/15/2021 1039   Nutritional Lab Results  Component Value Date   VD25OH 13.2 (L) 10/21/2022   VD25OH 20.0 (L) 07/01/2016   VD25OH 30.5 08/21/2015     Return in about 3 weeks (around 11/25/2022) for with Shawn and then in 3 weeks with  [facility].[name] She was informed of the importance of frequent follow up visits to maximize her success with intensive lifestyle modifications for her multiple health conditions.  ATTESTASTION STATEMENTS:  Reviewed by clinician on day of visit: allergies, medications, problem list, medical history, surgical history, family history, social history, and previous encounter notes.   I have spent 40 minutes in the care of the patient today including: preparing to see patient (e.g. review and interpretation of tests, old notes ), obtaining and/or reviewing separately obtained history, performing a medically appropriate examination or evaluation, counseling and educating the patient, documenting clinical information in the electronic or other health care record, and independently interpreting results and communicating results to the patient, family, or caregiver   Worthy Rancher, MD

## 2022-11-04 NOTE — Assessment & Plan Note (Signed)
See obesity tx plan

## 2022-11-04 NOTE — Assessment & Plan Note (Signed)
Most recent vitamin D levels  Lab Results  Component Value Date   VD25OH 13.2 (L) 10/21/2022   VD25OH 20.0 (L) 07/01/2016   VD25OH 30.5 08/21/2015     Deficiency state associated with adiposity and may result in leptin resistance, weight gain and fatigue.   Plan: ergocalciferol 50, 000 units once a week for 4 months.

## 2022-11-04 NOTE — Assessment & Plan Note (Signed)
Most recent A1c is  Lab Results  Component Value Date   HGBA1C 6.2 (H) 09/30/2022   HGBA1C 5.8 (H) 07/11/2019    Patient aware of disease state and risk of progression. This may contribute to abnormal cravings, fatigue and diabetic complications without having diabetes.   We have discussed treatment options which include: losing 7 to 10% of body weight, increasing physical activity to a goal of 150 minutes a week at moderate intensity.  Advised to maintain a diet low on simple and processed carbohydrates.  She may also be a candidate for pharmacoprophylaxis with metformin or incretin mimetic.

## 2022-11-04 NOTE — Assessment & Plan Note (Signed)
Detected on ultrasound in 2013.  Most recent liver enzymes are within normal limits.   Fibrosis 4 Score = 1.11 (Low risk)        Interpretation for patients with NAFLD          <1.30       -  F0-F1 (Low risk)          1.30-2.67 -  Indeterminate           >2.67      -  F3-F4 (High risk)     Validated for ages 90-65   No further work-up. Losing 15% may improve conditions also avoiding alcohol and reducing simple and processed carbs.

## 2022-11-05 ENCOUNTER — Ambulatory Visit
Admission: RE | Admit: 2022-11-05 | Discharge: 2022-11-05 | Disposition: A | Discharge status: Home / Self Care | Payer: BC Managed Care – PPO | Source: Ambulatory Visit | Attending: Internal Medicine | Admitting: Internal Medicine

## 2022-11-05 DIAGNOSIS — Z1231 Encounter for screening mammogram for malignant neoplasm of breast: Secondary | ICD-10-CM | POA: Insufficient documentation

## 2022-11-15 ENCOUNTER — Emergency Department: Payer: BC Managed Care – PPO

## 2022-11-15 ENCOUNTER — Other Ambulatory Visit: Payer: Self-pay

## 2022-11-15 ENCOUNTER — Emergency Department
Admission: EM | Admit: 2022-11-15 | Discharge: 2022-11-15 | Disposition: A | Discharge status: Home / Self Care | Payer: BC Managed Care – PPO | Attending: Emergency Medicine | Admitting: Emergency Medicine

## 2022-11-15 DIAGNOSIS — R0602 Shortness of breath: Secondary | ICD-10-CM | POA: Diagnosis not present

## 2022-11-15 DIAGNOSIS — I10 Essential (primary) hypertension: Secondary | ICD-10-CM | POA: Diagnosis not present

## 2022-11-15 DIAGNOSIS — R0789 Other chest pain: Secondary | ICD-10-CM | POA: Diagnosis not present

## 2022-11-15 DIAGNOSIS — R079 Chest pain, unspecified: Secondary | ICD-10-CM | POA: Diagnosis not present

## 2022-11-15 DIAGNOSIS — I4891 Unspecified atrial fibrillation: Secondary | ICD-10-CM | POA: Diagnosis not present

## 2022-11-15 LAB — TROPONIN I (HIGH SENSITIVITY)
Troponin I (High Sensitivity): <2 ng/L (ref <18)
Troponin I (High Sensitivity): <2 ng/L (ref <18)

## 2022-11-15 LAB — CBC
HCT: 39.8 % (ref 36.0–46.0)
Hemoglobin: 12.7 g/dL (ref 12.0–15.0)
MCH: 27.9 pg (ref 26.0–34.0)
MCHC: 31.9 g/dL (ref 30.0–36.0)
MCV: 87.5 fL (ref 80.0–100.0)
Platelets: 281 10*3/uL (ref 150–400)
RBC: 4.55 MIL/uL (ref 3.87–5.11)
RDW: 13.8 % (ref 11.5–15.5)
WBC: 10.5 10*3/uL (ref 4.0–10.5)
nRBC: 0 % (ref 0.0–0.2)

## 2022-11-15 LAB — BASIC METABOLIC PANEL
Anion gap: 10 (ref 5–15)
BUN: 19 mg/dL (ref 6–20)
CO2: 28 mmol/L (ref 22–32)
Calcium: 9 mg/dL (ref 8.9–10.3)
Chloride: 99 mmol/L (ref 98–111)
Creatinine, Ser: 1.26 mg/dL — ABNORMAL HIGH (ref 0.44–1.00)
GFR, Estimated: 49 mL/min — ABNORMAL LOW (ref >60)
Glucose, Bld: 94 mg/dL (ref 70–99)
Potassium: 3.7 mmol/L (ref 3.5–5.1)
Sodium: 137 mmol/L (ref 135–145)

## 2022-11-15 NOTE — ED Provider Notes (Signed)
[facility] Provider Note    Event Date/Time   First MD Initiated Contact with Patient 11/15/22 1723     (approximate)   History   Chest Pain   HPI  Heather Dudley is a 62 y.o. female with a history of atrial fibrillation, hypertension and GERD who presents with chest pain over the last week, intermittent course, described as tightness, occasionally with a sharp shot of pain that will last a few seconds.  These sharper episodes happen a few times per day.  The patient denies any significant shortness of breath.  She has no weakness or lightheadedness.  She denies any fever or cough.  She has no leg swelling.  I reviewed the past medical records.  The patient's most recent outpatient encounter was with the outpatient weight clinic on 9/11 for follow-up.  She has no recent hospitalizations or ED visits.   Physical Exam   Triage Vital Signs: ED Triage Vitals  Encounter Vitals Group     BP 11/15/22 1725 (!) 146/66     Systolic BP Percentile --      Diastolic BP Percentile --      Pulse Rate 11/15/22 1725 72     Resp 11/15/22 1725 18     Temp 11/15/22 1725 98.2 F (36.8 C)     Temp Source 11/15/22 1725 Oral     SpO2 11/15/22 1725 98 %     Weight --      Height --      Head Circumference --      Peak Flow --      Pain Score 11/15/22 1718 5     Pain Loc --      Pain Education --      Exclude from Growth Chart --     Most recent vital signs: Vitals:   11/15/22 2030 11/15/22 2100  BP: 134/73 135/70  Pulse: 66 63  Resp: (!) 26 18  Temp:    SpO2: 97% 98%     General: Awake, no distress.  CV:  Good peripheral perfusion.  Normal heart sounds. Resp:  Normal effort.  Lungs CTAB. Abd:  No distention.  Other:  No peripheral edema.   ED Results / Procedures / Treatments   Labs (all labs ordered are listed, but only abnormal results are displayed) Labs Reviewed  BASIC METABOLIC PANEL - Abnormal; Notable for the following components:       Result Value   Creatinine, Ser 1.26 (*)    GFR, Estimated 49 (*)    All other components within normal limits  CBC  TROPONIN I (HIGH SENSITIVITY)  TROPONIN I (HIGH SENSITIVITY)     EKG  ED ECG REPORT I, Dionne Bucy, the attending physician, personally viewed and interpreted this ECG.  Date: 11/15/2022 EKG Time: 1719 Rate: 73 Rhythm: normal sinus rhythm QRS Axis: normal Intervals: normal ST/T Wave abnormalities: normal Narrative Interpretation: no evidence of acute ischemia    RADIOLOGY  Chest x-ray: I independently viewed and interpreted the images; is no focal consolidation or edema   PROCEDURES:  Critical Care performed: No  Procedures   MEDICATIONS ORDERED IN ED: Medications - No data to display   IMPRESSION / MDM / ASSESSMENT AND PLAN / ED COURSE  I reviewed the triage vital signs and the nursing notes.  62 year old female with PMH as noted above presents with atypical intermittent chest pain over the last week with no significant associated symptoms.  She has atrial fibrillation but states that  she has been in sinus rhythm during this time.  On exam she is overall well-appearing with normal vital signs and no concerning exam findings.  EKG is nonischemic.  Chest x-ray shows no acute abnormalities.    Differential diagnosis includes, but is not limited to, ACS, musculoskeletal pain, GERD, neuropathic pain, atrial fibrillation.  The patient has no tachycardia or hypoxia, no DVT symptoms, and especially with intermittent pain, there is no evidence of PE, aortic dissection or other vascular etiology.  We will obtain basic labs, troponin, and reassess.  Patient's presentation is most consistent with acute complicated illness / injury requiring diagnostic workup.  The patient is on the cardiac monitor to evaluate for evidence of arrhythmia and/or significant heart rate changes.  ----------------------------------------- 9:30 PM on  11/15/2022 -----------------------------------------  Initial and repeat troponin are both negative.  BMP shows no acute findings.  CBC shows no leukocytosis or anemia.  The patient has remained asymptomatic with stable vital signs.  At this time, there is no evidence of ACS.  The patient is stable for discharge home.  She will follow-up with her primary care provider and cardiologist.  I gave strict return precautions and she expresses understanding.   FINAL CLINICAL IMPRESSION(S) / ED DIAGNOSES   Final diagnoses:  Atypical chest pain     Rx / DC Orders   ED Discharge Orders     None        Note:  This document was prepared using Dragon voice recognition software and may include unintentional dictation errors.    Dionne Bucy, MD 11/15/22 2233

## 2022-11-15 NOTE — ED Notes (Signed)
Pt to xray

## 2022-11-15 NOTE — ED Triage Notes (Addendum)
Pt comes with c/o cp on an off. Pt states that is normal but this time she started to have pain right arm. Pt states left sided cp. Pt states some sob. Pt has hx of afib and on thinners

## 2022-11-15 NOTE — Discharge Instructions (Signed)
Follow-up with your primary care provider and cardiologist.  Return to the ER for new, worsening, or persistent severe chest pain, difficulty breathing, weakness or lightheadedness, tachycardia, or any other new or worsening symptoms that concern you.

## 2022-11-17 ENCOUNTER — Telehealth: Payer: Self-pay

## 2022-11-17 NOTE — Transitions of Care (Post Inpatient/ED Visit) (Signed)
11/17/2022  Name: RIFKY KASTOR MRN: [id number] DOB: 1960/08/01  Today's TOC FU Call Status: Today's TOC FU Call Status:: Successful TOC FU Call Completed TOC FU Call Complete Date: 11/17/22 Patient's Name and Date of Birth confirmed.  Transition Care Management Follow-up Telephone Call Date of Discharge: 11/15/22 Discharge Facility: [facility]) Type of Discharge: Emergency Department Reason for ED Visit: Cardiac Conditions Cardiac Conditions Diagnosis: Chest Pain Persisting How have you been since you were released from the hospital?: Better Any questions or concerns?: No  Items Reviewed: Did you receive and understand the discharge instructions provided?: Yes Medications obtained,verified, and reconciled?: Yes (Medications Reviewed) Any new allergies since your discharge?: No Dietary orders reviewed?: NA Do you have support at home?: Yes  Medications Reviewed Today: Medications Reviewed Today     Reviewed by Mariel Sleet, CMA (Certified Medical Assistant) on 11/17/22 at [phone]  Med List Status: <None>   Medication Order Taking? Sig Documenting Provider Last Dose Status Informant  apixaban (ELIQUIS) 5 MG TABS tablet [id number]  Take 1 tablet (5 mg total) by mouth 2 (two) times daily. Lanier Prude, MD  Active   B Complex-Biotin-FA (B-COMPLEX PO) [id number]  Take 1 tablet by mouth daily. [provider]  Active Self  famotidine (PEPCID) 40 MG tablet [id number]  Take 1 tablet (40 mg total) by mouth daily. Reubin Milan, MD  Active   furosemide (LASIX) 20 MG tablet [id number]  Take 1 tablet (20 mg total) by mouth daily. Reubin Milan, MD  Active   levothyroxine (SYNTHROID) 75 MCG tablet [id number]  Take 75 mcg by mouth daily. [provider]  Active Self  LORazepam (ATIVAN) 0.5 MG tablet [id number]  Take 1 tablet (0.5 mg total) by mouth every 8 (eight) hours as needed. for anxiety Reubin Milan, MD  Active    metoprolol succinate (TOPROL-XL) 50 MG 24 hr tablet [id number]  Take 1.5 tablets (75 mg total) by mouth 2 (two) times daily. Take with or immediately following a meal. Gollan, Tollie Pizza, MD  Active   nitroGLYCERIN (NITROSTAT) 0.4 MG SL tablet [id number]  Place 1 tablet (0.4 mg total) under the tongue every 5 (five) minutes as needed for chest pain. Sunnie Nielsen, DO  Active   potassium chloride SA (KLOR-CON M) 20 MEQ tablet [id number]  Take 1 tablet (20 mEq total) by mouth daily. Antonieta Iba, MD  Active Self  propafenone (RYTHMOL SR) 225 MG 12 hr capsule [id number]  Take 225 mg by mouth 2 (two) times daily. [provider]  Active Self  rosuvastatin (CRESTOR) 5 MG tablet [id number]  Take 1 tablet (5 mg total) by mouth daily. Reubin Milan, MD  Active   Vitamin D, Ergocalciferol, (DRISDOL) 1.25 MG (50000 UNIT) CAPS capsule [id number]  Take 1 capsule (50,000 Units total) by mouth every 7 (seven) days. Worthy Rancher, MD  Active             Home Care and Equipment/Supplies: Were Home Health Services Ordered?: No Any new equipment or medical supplies ordered?: No  Functional Questionnaire: Do you need assistance with bathing/showering or dressing?: No Do you need assistance with meal preparation?: No Do you need assistance with eating?: No Do you have difficulty maintaining continence: No Do you need assistance with getting out of bed/getting out of a chair/moving?: No Do you have difficulty managing or taking your medications?: No  Follow up appointments reviewed: PCP Follow-up appointment confirmed?: No MD Provider Line [phone] Given: Yes Specialist  Hospital Follow-up appointment confirmed?: Yes Date of Specialist follow-up appointment?: 11/23/22 Follow-Up Specialty Provider:: Cardiology Do you need transportation to your follow-up appointment?: No Do you understand care options if your condition(s) worsen?: Yes-patient verbalized  understanding    Sary Bogie [facility]  Primary Care & Sports Medicine at [facility] CMA, AAMA [address]  [place] [address] Office [phone]  Fax: [phone]

## 2022-11-23 ENCOUNTER — Ambulatory Visit: Payer: BC Managed Care – PPO | Attending: Nurse Practitioner

## 2022-11-23 DIAGNOSIS — G4733 Obstructive sleep apnea (adult) (pediatric): Secondary | ICD-10-CM | POA: Diagnosis not present

## 2022-11-23 DIAGNOSIS — Z01810 Encounter for preprocedural cardiovascular examination: Secondary | ICD-10-CM

## 2022-11-23 DIAGNOSIS — I10 Essential (primary) hypertension: Secondary | ICD-10-CM | POA: Diagnosis not present

## 2022-11-23 NOTE — Telephone Encounter (Signed)
I s/w the requesting office and have confirmed they will need Eliquis to be held.

## 2022-11-23 NOTE — Progress Notes (Signed)
Virtual Visit via Telephone Note   Because of Heather Dudley's co-morbid illnesses, she is at least at moderate risk for complications without adequate follow up.  This format is felt to be most appropriate for this patient at this time.  The patient did not have access to video technology/had technical difficulties with video requiring transitioning to audio format only (telephone).  All issues noted in this document were discussed and addressed.  No physical exam could be performed with this format.  Please refer to the patient's chart for her consent to telehealth for [facility].  Evaluation Performed:  Preoperative cardiovascular risk assessment _____________   Date:  11/23/2022   [name], DOB September 15, 1960, MRN [id number] Patient Location:  Home Provider location:   Office  Primary Care Provider:  Reubin Milan, MD Primary Cardiologist:  Julien Nordmann, MD  Chief Complaint / Patient Profile   62 y.o. y/o female with a h/o persistent AF s/p PVI 12/2021, OSA (on CPAP), HTN who is pending colonoscopy and presents today for telephonic preoperative cardiovascular risk assessment.  History of Present Illness    Heather Dudley is a 62 y.o. female who presents via audio/video conferencing for a telehealth visit today.  Pt was last seen in cardiology clinic on 08/20/2022 by Dr. Lalla Brothers. At that time Heather Dudley was doing well continued to have breakthroughs of atrial fibrillation and was scheduled for a AF ablation in November.  The patient is now pending procedure as outlined above. Since her last visit, she has been doing well with no new cardiac complaints.   She denies chest pain, shortness of breath, lower extremity edema, fatigue, palpitations, melena, hematuria, hemoptysis, diaphoresis, weakness, presyncope, syncope, orthopnea, and PND.     Past Medical History    Past Medical History:  Diagnosis Date   A-fib (HCC)     Anemia    Back pain    Basal cell carcinoma of forehead    birthmark of forehead   Blood in stool 05/13/2021   Breast cancer (HCC)    Breast screening, unspecified    Cellulitis and abscess of trunk    Chest pain    Diastolic dysfunction    a. 05/2019 Echo: EF 55-60%, no rwma, mild LVH, Gr2 DD, Nl RV size/fxn. Triv MR.   Edema of both lower extremities    Fatty liver    GERD (gastroesophageal reflux disease)    Hernia    History of chemotherapy 2012   Hot flashes related to aromatase inhibitor therapy 09/08/2015   Hypertension    Hypothyroidism    Joint pain    Lump or mass in breast    Malignant neoplasm of breast (female), unspecified site    She underwent wide excision, mastoplasty and axillary dissection for her left breast malignancy on September 03, 2010. The primary tumor was 1 cm in diameter, and a single positive axillary node 1.3 cm in diameter. This was an ER-positive, PR slightly positive, HER-2/neu not overexpressing tumor. She tolerated her whole breast radiation without difficulty ending in late February 2013.   Malignant neoplasm of upper-outer quadrant of female breast (HCC)    Neoplasm of uncertain behavior of connective and other soft tissue    Obesity, unspecified    PAF (paroxysmal atrial fibrillation) (HCC) 05/04/2017   a. 04/2019 Zio: RSR, avg HR 79. 2% PAF burden (73-175, avg 115; longest 28secs). CHA2DS2VASc = 2-->Xarelto.   Palpitations    Personal history of chemotherapy  Personal history of malignant neoplasm of breast    The patient underwent wide excision, mastoplasty. Dissection for a T1b, N1, M0 carcinoma left breast on September 03, 2010.The primary tumor was 1 cm in diameter, and a single positive axillary node 1.3 cm in diameter. This was an ER-positive, PR slightly positive, HER-2/neu not overexpressing tumor.  The patient completed whole breast radiation in February 2013.   Personal history of radiation therapy 2013   LEFT lumpectomy   Pre-diabetes     Screening for obesity    Sleep apnea    SOB (shortness of breath)    Thyroid nodule    Past Surgical History:  Procedure Laterality Date   ABDOMINAL HYSTERECTOMY  2011   ovaries remain   ATRIAL FIBRILLATION ABLATION N/A 01/12/2022   Procedure: ATRIAL FIBRILLATION ABLATION;  Surgeon: Lanier Prude, MD;  Location: [facility] INVASIVE CV LAB;  Service: Cardiovascular;  Laterality: N/A;   birth mark removed     BREAST CYST ASPIRATION Right    negative 01/2010   BREAST EXCISIONAL BIOPSY  08/05/2010   left breast positive 07/2010   BREAST LUMPECTOMY  2012   left breast   COLONOSCOPY  07/29/2012   normal study.   LEFT HEART CATH AND CORONARY ANGIOGRAPHY N/A 12/08/2021   Procedure: LEFT HEART CATH AND CORONARY ANGIOGRAPHY;  Surgeon: Iran Ouch, MD;  Location: [facility] INVASIVE CV LAB;  Service: Cardiovascular;  Laterality: N/A;   robotic tonsillectomy Lingual tonsils Bilateral 06/08/2016   THYROID SURGERY     partially removed   TUBAL LIGATION      Allergies  Allergies  Allergen Reactions   Ace Inhibitors Swelling   Sernivo [Betamethasone Dipropionate] Itching   Penicillins Rash    Has patient had a PCN reaction causing immediate rash, facial/tongue/throat swelling, SOB or lightheadedness with hypotension: Yes Has patient had a PCN reaction causing severe rash involving mucus membranes or skin necrosis: No Has patient had a PCN reaction that required hospitalization: No Has patient had a PCN reaction occurring within the last 10 years: No If all of the above answers are "NO", then may proceed with Cephalosporin use.    Home Medications    Prior to Admission medications   Medication Sig Start Date End Date Taking? Authorizing Provider  apixaban (ELIQUIS) 5 MG TABS tablet Take 1 tablet (5 mg total) by mouth 2 (two) times daily. 10/02/22   Lanier Prude, MD  B Complex-Biotin-FA (B-COMPLEX PO) Take 1 tablet by mouth daily.    [provider]  famotidine (PEPCID) 40  MG tablet Take 1 tablet (40 mg total) by mouth daily. 09/30/22   Reubin Milan, MD  furosemide (LASIX) 20 MG tablet Take 1 tablet (20 mg total) by mouth daily. 10/07/22   Reubin Milan, MD  levothyroxine (SYNTHROID) 75 MCG tablet Take 75 mcg by mouth daily. 10/10/20   [provider]  LORazepam (ATIVAN) 0.5 MG tablet Take 1 tablet (0.5 mg total) by mouth every 8 (eight) hours as needed. for anxiety 09/30/22   Reubin Milan, MD  metoprolol succinate (TOPROL-XL) 50 MG 24 hr tablet Take 1.5 tablets (75 mg total) by mouth 2 (two) times daily. Take with or immediately following a meal. 03/30/22   Gollan, Tollie Pizza, MD  nitroGLYCERIN (NITROSTAT) 0.4 MG SL tablet Place 1 tablet (0.4 mg total) under the tongue every 5 (five) minutes as needed for chest pain. 12/09/21   Sunnie Nielsen, DO  potassium chloride SA (KLOR-CON M) 20 MEQ tablet Take 1  tablet (20 mEq total) by mouth daily. 05/26/21   Antonieta Iba, MD  propafenone (RYTHMOL SR) 225 MG 12 hr capsule Take 225 mg by mouth 2 (two) times daily. 06/04/22   [provider]  rosuvastatin (CRESTOR) 5 MG tablet Take 1 tablet (5 mg total) by mouth daily. 09/30/22   Reubin Milan, MD  Vitamin D, Ergocalciferol, (DRISDOL) 1.25 MG (50000 UNIT) CAPS capsule Take 1 capsule (50,000 Units total) by mouth every 7 (seven) days. 11/04/22   Worthy Rancher, MD    Physical Exam    Vital Signs:  Becky Augusta does not have vital signs available for review today.  Given telephonic nature of communication, physical exam is limited. AAOx3. NAD. Normal affect.  Speech and respirations are unlabored.  Accessory Clinical Findings    None  Assessment & Plan    1.  Preoperative Cardiovascular Risk Assessment: -Patient's RCRI score is 0.9%  The patient affirms she has been doing well without any new cardiac symptoms. They are able to achieve 7 METS without cardiac limitations. Therefore, based on ACC/AHA guidelines, the patient would  be at acceptable risk for the planned procedure without further cardiovascular testing. The patient was advised that if she develops new symptoms prior to surgery to contact our office to arrange for a follow-up visit, and she verbalized understanding.   The patient was advised that if she develops new symptoms prior to surgery to contact our office to arrange for a follow-up visit, and she verbalized understanding.  Patient can hold Eliquis 2 days prior to procedure and should restart postprocedure when surgically safe and hemostasis is achieved.  A copy of this note will be routed to requesting surgeon.  Time:   Today, I have spent 6 minutes with the patient with telehealth technology discussing medical history, symptoms, and management plan.     Napoleon Form, Leodis Rains, NP  11/23/2022, 7:27 AM

## 2022-11-23 NOTE — Telephone Encounter (Signed)
Good morning,  Heather Dudley is scheduled for preop clearance visit today at 10:00 and is currently on Eliquis for history of AF. Please contact requesting provider to see if Eliquis is needed to be held for procedure.  Thank you, Robin Searing, NP

## 2022-11-23 NOTE — Telephone Encounter (Signed)
Pharmacy please advise on holding Eliquis prior to colonoscopy scheduled for 12/07/2022. Thank you.

## 2022-11-23 NOTE — Telephone Encounter (Signed)
Patient with diagnosis of afib on Eliquis for anticoagulation.    Procedure: colonoscopy Date of procedure: 12/10/22   CHA2DS2-VASc Score = 3   This indicates a 3.2% annual risk of stroke. The patient's score is based upon: CHF History: 0 HTN History: 1 Diabetes History: 0 Stroke History: 0 Vascular Disease History: 1 (aortic atherosclerosis on CT) Age Score: 0 Gender Score: 1      CrCl 62 ml/min Platelet count 293  Patient is having an afib ablation on 11/17. There is still at least 4 weeks prior to ablation after colonoscopy. There there should be on interference.  Per office protocol, patient can hold Eliquis for 2 days prior to procedure.    **This guidance is not considered finalized until pre-operative APP has relayed final recommendations.**

## 2022-11-30 ENCOUNTER — Encounter (INDEPENDENT_AMBULATORY_CARE_PROVIDER_SITE_OTHER): Payer: Self-pay | Admitting: Family Medicine

## 2022-11-30 ENCOUNTER — Ambulatory Visit (INDEPENDENT_AMBULATORY_CARE_PROVIDER_SITE_OTHER): Payer: BC Managed Care – PPO | Admitting: Family Medicine

## 2022-11-30 VITALS — BP 158/88 | HR 76 | Temp 98.1°F | Ht 66.0 in | Wt 263.0 lb

## 2022-11-30 DIAGNOSIS — R7303 Prediabetes: Secondary | ICD-10-CM

## 2022-11-30 DIAGNOSIS — E7849 Other hyperlipidemia: Secondary | ICD-10-CM | POA: Diagnosis not present

## 2022-11-30 DIAGNOSIS — Z6841 Body Mass Index (BMI) 40.0 and over, adult: Secondary | ICD-10-CM | POA: Diagnosis not present

## 2022-11-30 DIAGNOSIS — E669 Obesity, unspecified: Secondary | ICD-10-CM | POA: Diagnosis not present

## 2022-11-30 DIAGNOSIS — E66813 Obesity, class 3: Secondary | ICD-10-CM

## 2022-11-30 NOTE — Progress Notes (Signed)
Chief Complaint:   OBESITY Jaedyn is here to discuss her progress with her obesity treatment plan along with follow-up of her obesity related diagnoses. Yovanna is on the Category 3 Plan and states she is following her eating plan approximately 25% of the time. [place] states she is walking 3,700 steps 5 times per week.    Today's visit was #: 3 Starting weight: 263 lbs Starting date: 10/21/2022 Today's weight: 263 lbs Today's date: 11/30/2022 Total lbs lost to date: 0 Total lbs lost since last in-office visit: 0  Interim History: Patient presents for first time appointment with me and previously saw Dr. Rikki Spearing and Dr. Dalbert Garnet.  She has previously done an ablation for her atrial fibrillation and a few weeks ago she had chest pain like she did a year ago.  She feels comfortable with the weight she is at right now.  She feels very challenged by the meal plan. She does not particularly like eggs for breakfast.  Lunch is leftovers.  She is feeling significant non scale victories.  Subjective:   1. Prediabetes Patient's last A1c was 6.2 (patient voices she likes carbohydrates).  She is not on medications.  2. Other hyperlipidemia Patient is on Crestor daily with no side effects noted.  Assessment/Plan:   1. Prediabetes Patient was counseled on carbohydrates, protein, and fat ratio today.  2. Other hyperlipidemia Patient will continue Crestor; we will repeat labs in January.  3. BMI 40.0-44.9, adult (HCC)  4. Obesity with starting BMI of 42.4 Fadia is currently in the action stage of change. As such, her goal is to continue with weight loss efforts. She has agreed to the Category 3 Plan.   Exercise goals: All adults should avoid inactivity. Some physical activity is better than none, and adults who participate in any amount of physical activity gain some health benefits.  Behavioral modification strategies: increasing lean protein intake, meal planning and cooking  strategies, keeping healthy foods in the home, and planning for success.  Omya has agreed to follow-up with our clinic in 2 to 3 weeks. She was informed of the importance of frequent follow-up visits to maximize her success with intensive lifestyle modifications for her multiple health conditions.   Objective:   Blood pressure (!) 158/88, pulse 76, temperature 98.1 F (36.7 C), height 5\' 6"  (1.676 m), weight 263 lb (119.3 kg), last menstrual period 04/01/2008, SpO2 99%. Body mass index is 42.45 kg/m.  General: Cooperative, alert, well developed, in no acute distress. HEENT: Conjunctivae and lids unremarkable. Cardiovascular: Regular rhythm.  Lungs: Normal work of breathing. Neurologic: No focal deficits.   Lab Results  Component Value Date   CREATININE 1.26 (H) 11/15/2022   BUN 19 11/15/2022   NA 137 11/15/2022   K 3.7 11/15/2022   CL 99 11/15/2022   CO2 28 11/15/2022   Lab Results  Component Value Date   ALT 11 09/30/2022   AST 18 09/30/2022   ALKPHOS 96 09/30/2022   BILITOT 0.4 09/30/2022   Lab Results  Component Value Date   HGBA1C 6.2 (H) 09/30/2022   HGBA1C 6.3 (A) 03/18/2022   HGBA1C 6.1 (H) 07/15/2021   HGBA1C 5.9 (H) 07/12/2020   HGBA1C 5.8 (H) 07/11/2019   Lab Results  Component Value Date   INSULIN 19.6 10/21/2022   Lab Results  Component Value Date   TSH 1.140 12/26/2021   Lab Results  Component Value Date   CHOL 247 (H) 09/30/2022   HDL 46 09/30/2022   LDLCALC 186 (  H) 09/30/2022   TRIG 87 09/30/2022   CHOLHDL 5.4 (H) 09/30/2022   Lab Results  Component Value Date   VD25OH 13.2 (L) 10/21/2022   VD25OH 20.0 (L) 07/01/2016   VD25OH 30.5 08/21/2015   Lab Results  Component Value Date   WBC 10.5 11/15/2022   HGB 12.7 11/15/2022   HCT 39.8 11/15/2022   MCV 87.5 11/15/2022   PLT 281 11/15/2022   No results found for: "IRON", "TIBC", "FERRITIN"  Attestation Statements:   Reviewed by clinician on day of visit: allergies, medications,  problem list, medical history, surgical history, family history, social history, and previous encounter notes.  Time spent on visit including pre-visit chart review and post-visit care and charting was 30 minutes.   I, Burt Knack, am acting as transcriptionist for Reuben Likes, MD.  I have reviewed the above documentation for accuracy and completeness, and I agree with the above. - Reuben Likes, MD

## 2022-12-02 ENCOUNTER — Telehealth: Payer: Self-pay | Admitting: *Deleted

## 2022-12-02 NOTE — Telephone Encounter (Signed)
Per Cardiology   Date:  11/23/2022    [name], Peri 07-25-1960, MRN [id number] Patient Location:  Home Provider location:   Office   Primary Care Provider:  Reubin Milan, MD Primary Cardiologist:  Julien Nordmann, MD   Chief Complaint / Patient Profile    62 y.o. y/o female with a h/o persistent AF s/p PVI 12/2021, OSA (on CPAP), HTN who is pending colonoscopy and presents today for telephonic preoperative cardiovascular risk assessment.  Assessment & Plan    1.  Preoperative Cardiovascular Risk Assessment: -Patient's RCRI score is 0.9%   The patient affirms she has been doing well without any new cardiac symptoms. They are able to achieve 7 METS without cardiac limitations. Therefore, based on ACC/AHA guidelines, the patient would be at acceptable risk for the planned procedure without further cardiovascular testing. The patient was advised that if she develops new symptoms prior to surgery to contact our office to arrange for a follow-up visit, and she verbalized understanding.    The patient was advised that if she develops new symptoms prior to surgery to contact our office to arrange for a follow-up visit, and she verbalized understanding.   Patient can hold Eliquis 2 days prior to procedure and should restart postprocedure when surgically safe and hemostasis is achieved.   A copy of this note will be routed to requesting surgeon.   Time:   Today, I have spent 6 minutes with the patient with telehealth technology discussing medical history, symptoms, and management plan.       Napoleon Form, Leodis Rains, NP   11/23/2022, 7:27 AM

## 2022-12-02 NOTE — Telephone Encounter (Signed)
Patient can hold Eliquis 2 days prior to procedure and should restart postprocedure when surgically safe and hemostasis is achieved.  Colonoscopy is schedule for 12/10/2022 with Dr Servando Snare at [facility]  Will call patient on Monday, 01/07/2023 regarding Eliquis

## 2022-12-03 ENCOUNTER — Encounter: Payer: Self-pay | Admitting: Gastroenterology

## 2022-12-03 NOTE — Progress Notes (Signed)
Cardiology Office Note  Date:  12/04/2022   ID:  Heather Dudley, DOB Jan 22, 1961, MRN [id number]  PCP:  Reubin Milan, MD   Chief Complaint  Patient presents with   6 month follow up     Patient was at [facility] ER with chest pain x 1 month ago. Medications reviewed by the patient verbally.     HPI:  Heather Dudley is a 62 y.o. female  HTN Nonsmoker No diabetes breast cancer on the left, lumpectomy and lymph node dissection., chemotherapy 2015 remote chest pain Dx with OSA, on CPAP,  Atherosclerosis on CT Chronic chest pain Cardiac catheterization October 2023 no significant coronary disease afib and flutter ablation 01/12/22.  Angioedema on ACE inhibitors Presents for f/u of her chest pain, tachycardia, paroxysmal atrial fibrillation  Last seen by myself in clinic February 2024 Seen by EP June 2024  CT scan scheduled prior to A-fib ablation January 11, 2023  Normal sinus rhythm on today's visit  Seen in the ER November 15, 2018 for for chest pain tightness, occasionally with a sharp shot of pain that will last a few seconds. These sharper episodes happen a few times per day.  Troponin negative  On further discussion she reports having chronic chest pain, lasts 1 week Often relieved by taking amlodipine as needed Reports she had old amlodipine prescription, has taken some which helped her symptoms  BP elevated at home Going to weight loss clinic  On Lasix daily with potassium daily  Arrhythmia medication history Multaq never filled secondary to cost Propafenone makes her feel tired with only taking once a day Flecainide caused her to feel "Scattered"  underwent afib and flutter ablation 01/12/22.  Remains on metoprolol succinate 75 twice daily, propafenone SR 225 twice daily Eliquis 5 twice daily  Stopped lipitor and zetia, caused anxiety  EKG personally reviewed by myself on todays visit EKG Interpretation Date/Time:  Friday December 04 2022  14:45:24 EDT Ventricular Rate:  68 PR Interval:  174 QRS Duration:  88 QT Interval:  390 QTC Calculation: 414 R Axis:   -24  Text Interpretation: Normal sinus rhythm Possible Left atrial enlargement Minimal voltage criteria for LVH, may be normal variant ( Cornell product ) When compared with ECG of 15-Nov-2022 17:19, No significant change was found Confirmed by Julien Nordmann [phone]) on 12/04/2022 2:54:55 PM    past medical history reviewed Eye itching on amlodipine, on further discussion today she is not clear of reaction Side effects on lisinopril Side effect on flecainide, "felt scattered"  Seen in emergency room January 24, 2020 Had discoordination, anxiety, atypical chest pain Night before had episode of atrial fibrillation  Echo April 2021 Left ventricular ejection fraction, by estimation, is 55 to 60%. The  left ventricle has normal function Left ventricular diastolic parameters  are consistent with Grade II diastolic dysfunction (pseudonormalization).  moderately elevated pulmonary artery systolic pressure.   Monitor reviewed avg HR of 79 bpm. Predominant underlying rhythm was Sinus Rhythm. Atrial Fibrillation occurred (2% burden), ranging from 73-175 bpm (avg of 115 bpm), the longest lasting 52 mins 28 secs with an avg rate of 118 bpm.  CT chest:  No significant coronary calcified atherosclerosis or aortic plaque  History of lumpectomy and lymph node dissection.  Sometimes this causes discomfort in her left arm  PMH:   has a past medical history of A-fib (HCC), Anemia, Back pain, Basal cell carcinoma of forehead, Blood in stool (05/13/2021), Breast cancer (HCC), Breast screening, unspecified, Cellulitis and abscess of  trunk, Chest pain, Diastolic dysfunction, Edema of both lower extremities, Fatty liver, GERD (gastroesophageal reflux disease), Hernia, History of chemotherapy (2012), Hot flashes related to aromatase inhibitor therapy (09/08/2015), Hypertension,  Hypothyroidism, Joint pain, Lump or mass in breast, Malignant neoplasm of breast (female), unspecified site, Malignant neoplasm of upper-outer quadrant of female breast (HCC), Neoplasm of uncertain behavior of connective and other soft tissue, Obesity, unspecified, PAF (paroxysmal atrial fibrillation) (HCC) (05/04/2017), Palpitations, Personal history of chemotherapy, Personal history of malignant neoplasm of breast, Personal history of radiation therapy (2013), Pre-diabetes, Screening for obesity, Sleep apnea, SOB (shortness of breath), and Thyroid nodule.  PSH:    Past Surgical History:  Procedure Laterality Date   ABDOMINAL HYSTERECTOMY  2011   ovaries remain   ATRIAL FIBRILLATION ABLATION N/A 01/12/2022   Procedure: ATRIAL FIBRILLATION ABLATION;  Surgeon: Lanier Prude, MD;  [facility]: [facility];  Service: Cardiovascular;  Laterality: N/A;   birth mark removed     BREAST CYST ASPIRATION Right    negative 01/2010   BREAST EXCISIONAL BIOPSY  08/05/2010   left breast positive 07/2010   BREAST LUMPECTOMY  2012   left breast   COLONOSCOPY  07/29/2012   normal study.   LEFT HEART CATH AND CORONARY ANGIOGRAPHY N/A 12/08/2021   Procedure: LEFT HEART CATH AND CORONARY ANGIOGRAPHY;  Surgeon: Iran Ouch, MD;  Location: [facility] INVASIVE CV LAB;  Service: Cardiovascular;  Laterality: N/A;   robotic tonsillectomy Lingual tonsils Bilateral 06/08/2016   THYROID SURGERY     partially removed   TUBAL LIGATION      Current Outpatient Medications  Medication Sig Dispense Refill   apixaban (ELIQUIS) 5 MG TABS tablet Take 1 tablet (5 mg total) by mouth 2 (two) times daily. 180 tablet 1   B Complex-Biotin-FA (B-COMPLEX PO) Take 1 tablet by mouth daily.     famotidine (PEPCID) 40 MG tablet Take 1 tablet (40 mg total) by mouth daily. 90 tablet 1   furosemide (LASIX) 20 MG tablet Take 1 tablet (20 mg total) by mouth daily. 90 tablet 1   levothyroxine (SYNTHROID) 75 MCG tablet Take 75 mcg by  mouth daily.     LORazepam (ATIVAN) 0.5 MG tablet Take 1 tablet (0.5 mg total) by mouth every 8 (eight) hours as needed. for anxiety 20 tablet 0   metoprolol succinate (TOPROL-XL) 50 MG 24 hr tablet Take 1.5 tablets (75 mg total) by mouth 2 (two) times daily. Take with or immediately following a meal. 270 tablet 3   nitroGLYCERIN (NITROSTAT) 0.4 MG SL tablet Place 1 tablet (0.4 mg total) under the tongue every 5 (five) minutes as needed for chest pain. 30 tablet 0   potassium chloride SA (KLOR-CON M) 20 MEQ tablet Take 1 tablet (20 mEq total) by mouth daily. 90 tablet 3   propafenone (RYTHMOL SR) 225 MG 12 hr capsule Take 225 mg by mouth 2 (two) times daily.     rosuvastatin (CRESTOR) 5 MG tablet Take 1 tablet (5 mg total) by mouth daily. 90 tablet 1   Vitamin D, Ergocalciferol, (DRISDOL) 1.25 MG (50000 UNIT) CAPS capsule Take 1 capsule (50,000 Units total) by mouth every 7 (seven) days. 16 capsule 0   No current facility-administered medications for this visit.    Allergies:   Ace inhibitors, Sernivo [betamethasone dipropionate], and Penicillins   Social History:  The patient  reports that she has never smoked. She has never used smokeless tobacco. She reports that she does not currently use alcohol. She reports that  she does not use drugs.   Family History:   family history includes Cancer in her cousin, maternal uncle, mother, and paternal aunt; Colon cancer in an other family member; Heart disease in her father; Heart failure in her father; High blood pressure in her father and mother; Ovarian cancer in an other family member; Thyroid disease in her mother.    Review of Systems: Review of Systems  Constitutional: Negative.   HENT: Negative.    Respiratory: Negative.    Cardiovascular: Negative.   Gastrointestinal: Negative.   Musculoskeletal: Negative.   Neurological: Negative.   Psychiatric/Behavioral: Negative.    All other systems reviewed and are negative.   PHYSICAL  EXAM: VS:  BP (!) 156/80 (BP Location: Right Arm, Patient Position: Sitting, Cuff Size: Large)   Pulse 68   Ht 5\' 7"  (1.702 m)   Wt 268 lb (121.6 kg)   LMP 04/01/2008   SpO2 97%   BMI 41.97 kg/m  , BMI Body mass index is 41.97 kg/m. Constitutional:  oriented to person, place, and time. No distress.  HENT:  Head: Grossly normal Eyes:  no discharge. No scleral icterus.  Neck: No JVD, no carotid bruits  Cardiovascular: Regular rate and rhythm, no murmurs appreciated Pulmonary/Chest: Clear to auscultation bilaterally, no wheezes or rails Abdominal: Soft.  no distension.  no tenderness.  Musculoskeletal: Normal range of motion Neurological:  normal muscle tone. Coordination normal. No atrophy Skin: Skin warm and dry Psychiatric: normal affect, pleasant  Recent Labs: 12/06/2021: Magnesium 1.9 12/26/2021: TSH 1.140 09/30/2022: ALT 11 11/15/2022: BUN 19; Creatinine, Ser 1.26; Hemoglobin 12.7; Platelets 281; Potassium 3.7; Sodium 137    Lipid Panel Lab Results  Component Value Date   CHOL 247 (H) 09/30/2022   HDL 46 09/30/2022   LDLCALC 186 (H) 09/30/2022   TRIG 87 09/30/2022     Wt Readings from Last 3 Encounters:  12/04/22 268 lb (121.6 kg)  11/30/22 263 lb (119.3 kg)  11/04/22 262 lb (118.8 kg)     ASSESSMENT AND PLAN:  Paroxysmal atrial fibrillation  ablation November 2023, recurrent atrial fibrillation weaning off the medication  continue metoprolol succinate 75 twice daily, Eliquis, propafenone Scheduled for repeat ablation November 2024  Chest pain, unspecified type - CT scan chest reviewed no coronary calcification, no aortic atherosclerosis CT scan 2020 reviewed  no significant coronary calcification or aortic atherosclerosis Cardiac catheterization 2023 no significant coronary disease Pain is atypical, relieved with amlodipine as needed  Hyperlipidemia, mixed Side effects on Lipitor, changed to Crestor  Essential hypertension -  Blood pressure running  high, Will avoid ARB given prior history angioedema on ACE inhibitor's Avoid HCTZ as she is on Lasix We have previously tried Cardura, this is not on her medication list, unclear side effect Recommend we retry amlodipine 5 daily Other options include long-acting nitrates, hydralazine  Malignant neoplasm of left breast in female, estrogen receptor positive, unspecified site of breast (HCC) Previous chemotherapy and radiation on the left Lymphedema left arm Normal ejection fraction 2021 discussed   Orders Placed This Encounter  Procedures   EKG 12-Lead     Signed, Dossie Arbour, M.D., Ph.D. 12/04/2022  [facility] [place], [place] [phone]

## 2022-12-04 ENCOUNTER — Encounter: Payer: Self-pay | Admitting: Cardiovascular Disease

## 2022-12-04 ENCOUNTER — Ambulatory Visit: Payer: BC Managed Care – PPO | Attending: Cardiovascular Disease | Admitting: Cardiovascular Disease

## 2022-12-04 VITALS — BP 156/80 | HR 68 | Ht 67.0 in | Wt 268.0 lb

## 2022-12-04 DIAGNOSIS — G4733 Obstructive sleep apnea (adult) (pediatric): Secondary | ICD-10-CM | POA: Diagnosis not present

## 2022-12-04 DIAGNOSIS — I483 Typical atrial flutter: Secondary | ICD-10-CM | POA: Diagnosis not present

## 2022-12-04 DIAGNOSIS — R7303 Prediabetes: Secondary | ICD-10-CM

## 2022-12-04 DIAGNOSIS — I10 Essential (primary) hypertension: Secondary | ICD-10-CM | POA: Diagnosis not present

## 2022-12-04 DIAGNOSIS — I2510 Atherosclerotic heart disease of native coronary artery without angina pectoris: Secondary | ICD-10-CM

## 2022-12-04 DIAGNOSIS — I5032 Chronic diastolic (congestive) heart failure: Secondary | ICD-10-CM

## 2022-12-04 DIAGNOSIS — I480 Paroxysmal atrial fibrillation: Secondary | ICD-10-CM | POA: Diagnosis not present

## 2022-12-04 DIAGNOSIS — I25118 Atherosclerotic heart disease of native coronary artery with other forms of angina pectoris: Secondary | ICD-10-CM

## 2022-12-04 MED ORDER — VALSARTAN 160 MG PO TABS: 160.0000 mg | ORAL_TABLET | Freq: Every day | ORAL | 3 refills | Status: DC

## 2022-12-04 MED ORDER — AMLODIPINE BESYLATE 5 MG PO TABS: 5.0000 mg | ORAL_TABLET | ORAL | 3 refills | Status: DC | PRN

## 2022-12-04 MED ORDER — POTASSIUM CHLORIDE CRYS ER 20 MEQ PO TBCR: 20.0000 meq | EXTENDED_RELEASE_TABLET | Freq: Every day | ORAL | 3 refills | Status: DC

## 2022-12-04 NOTE — Patient Instructions (Addendum)
  Medication Instructions:  Please start valsartan (DIOVAN) 160 MG tablet daily Monitor blood pressure  amLODipine (NORVASC) 5 MG daily as needed for chest pain  If you need a refill on your cardiac medications before your next appointment, please call your pharmacy.   Lab work: No new labs needed  Testing/Procedures: No new testing needed  Follow-Up: At [facility], you and your health needs are our priority.  As part of our continuing mission to provide you with exceptional heart care, we have created designated Provider Care Teams.  These Care Teams include your primary Cardiologist (physician) and Advanced Practice Providers (APPs -  Physician Assistants and Nurse Practitioners) who all work together to provide you with the care you need, when you need it.  You will need a follow up appointment in 12 months  Providers on your designated Care Team:   Nicolasa Ducking, NP Eula Listen, PA-C Cadence Fransico Michael, [place]  COVID-19 Vaccine Information can be found at: [email or web address] For questions related to vaccine distribution or appointments, please email [email or web address] or call [phone].

## 2022-12-09 ENCOUNTER — Encounter: Payer: Self-pay | Admitting: Gastroenterology

## 2022-12-10 ENCOUNTER — Encounter: Payer: Self-pay | Admitting: Anesthesiology

## 2022-12-10 ENCOUNTER — Encounter
Admission: RE | Disposition: A | Discharge status: Home / Self Care | Payer: Self-pay | Source: Home / Self Care | Attending: Gastroenterology

## 2022-12-10 ENCOUNTER — Ambulatory Visit: Payer: Self-pay | Admitting: Anesthesiology

## 2022-12-10 ENCOUNTER — Encounter: Payer: Self-pay | Admitting: Gastroenterology

## 2022-12-10 ENCOUNTER — Other Ambulatory Visit: Payer: Self-pay

## 2022-12-10 ENCOUNTER — Ambulatory Visit
Admission: RE | Admit: 2022-12-10 | Discharge: 2022-12-10 | Disposition: A | Discharge status: Home / Self Care | Payer: BC Managed Care – PPO | Attending: Gastroenterology | Admitting: Gastroenterology

## 2022-12-10 DIAGNOSIS — D120 Benign neoplasm of cecum: Secondary | ICD-10-CM | POA: Insufficient documentation

## 2022-12-10 DIAGNOSIS — Z85828 Personal history of other malignant neoplasm of skin: Secondary | ICD-10-CM | POA: Insufficient documentation

## 2022-12-10 DIAGNOSIS — Z9221 Personal history of antineoplastic chemotherapy: Secondary | ICD-10-CM | POA: Insufficient documentation

## 2022-12-10 DIAGNOSIS — K635 Polyp of colon: Secondary | ICD-10-CM | POA: Diagnosis not present

## 2022-12-10 DIAGNOSIS — Z1211 Encounter for screening for malignant neoplasm of colon: Secondary | ICD-10-CM | POA: Diagnosis not present

## 2022-12-10 DIAGNOSIS — Z7901 Long term (current) use of anticoagulants: Secondary | ICD-10-CM | POA: Diagnosis not present

## 2022-12-10 DIAGNOSIS — E039 Hypothyroidism, unspecified: Secondary | ICD-10-CM | POA: Diagnosis not present

## 2022-12-10 DIAGNOSIS — Z6841 Body Mass Index (BMI) 40.0 and over, adult: Secondary | ICD-10-CM | POA: Diagnosis not present

## 2022-12-10 DIAGNOSIS — Z800 Family history of malignant neoplasm of digestive organs: Secondary | ICD-10-CM | POA: Insufficient documentation

## 2022-12-10 DIAGNOSIS — I480 Paroxysmal atrial fibrillation: Secondary | ICD-10-CM | POA: Diagnosis not present

## 2022-12-10 DIAGNOSIS — I10 Essential (primary) hypertension: Secondary | ICD-10-CM | POA: Diagnosis not present

## 2022-12-10 DIAGNOSIS — K219 Gastro-esophageal reflux disease without esophagitis: Secondary | ICD-10-CM | POA: Diagnosis not present

## 2022-12-10 DIAGNOSIS — Z923 Personal history of irradiation: Secondary | ICD-10-CM | POA: Diagnosis not present

## 2022-12-10 DIAGNOSIS — Z79890 Hormone replacement therapy (postmenopausal): Secondary | ICD-10-CM | POA: Insufficient documentation

## 2022-12-10 DIAGNOSIS — G4730 Sleep apnea, unspecified: Secondary | ICD-10-CM | POA: Insufficient documentation

## 2022-12-10 DIAGNOSIS — Z853 Personal history of malignant neoplasm of breast: Secondary | ICD-10-CM | POA: Insufficient documentation

## 2022-12-10 DIAGNOSIS — E669 Obesity, unspecified: Secondary | ICD-10-CM | POA: Diagnosis not present

## 2022-12-10 DIAGNOSIS — I2510 Atherosclerotic heart disease of native coronary artery without angina pectoris: Secondary | ICD-10-CM | POA: Diagnosis not present

## 2022-12-10 HISTORY — PX: COLONOSCOPY WITH PROPOFOL: SHX5780

## 2022-12-10 HISTORY — PX: POLYPECTOMY: SHX5525

## 2022-12-10 HISTORY — PX: HEMOSTASIS CLIP PLACEMENT: SHX6857

## 2022-12-10 HISTORY — PX: HOT HEMOSTASIS: SHX5433

## 2022-12-10 SURGERY — COLONOSCOPY WITH PROPOFOL
Anesthesia: General

## 2022-12-10 MED ORDER — PROPOFOL 500 MG/50ML IV EMUL
INTRAVENOUS | Status: DC | PRN
  Administered 2022-12-10: 140 ug/kg/min via INTRAVENOUS

## 2022-12-10 MED ORDER — LIDOCAINE HCL (CARDIAC) PF 100 MG/5ML IV SOSY
PREFILLED_SYRINGE | INTRAVENOUS | Status: DC | PRN
  Administered 2022-12-10: 50 mg via INTRAVENOUS

## 2022-12-10 MED ORDER — PROPOFOL 10 MG/ML IV BOLUS
INTRAVENOUS | Status: DC | PRN
Start: 2022-12-10 — End: 2022-12-10
  Administered 2022-12-10: 80 mg via INTRAVENOUS

## 2022-12-10 MED ORDER — SODIUM CHLORIDE 0.9 % IV SOLN: INTRAVENOUS | Status: DC

## 2022-12-10 NOTE — Anesthesia Postprocedure Evaluation (Signed)
Anesthesia Post Note  Patient: Melat Wrisley Schemm  Procedure(s) Performed: COLONOSCOPY WITH PROPOFOL POLYPECTOMY HEMOSTASIS CLIP PLACEMENT HOT HEMOSTASIS (ARGON PLASMA COAGULATION/BICAP)  Patient location during evaluation: PACU Anesthesia Type: General Level of consciousness: awake and awake and alert Pain management: satisfactory to patient Vital Signs Assessment: post-procedure vital signs reviewed and stable Respiratory status: spontaneous breathing Cardiovascular status: stable Anesthetic complications: no   No notable events documented.   Last Vitals:  Vitals:   12/10/22 1138 12/10/22 1148  BP: (!) 147/66 (!) 158/76  Pulse: 65 61  Resp: 18   Temp: (!) 36.4 C   SpO2: 99% 100%    Last Pain:  Vitals:   12/10/22 1148  TempSrc:   PainSc: 0-No pain                 VAN STAVEREN,Mariabelen Pressly

## 2022-12-10 NOTE — H&P (Signed)
Midge Minium, MD [facility] [address], [address] [place], [place] [address] Phone: [phone] Fax : [phone]  Primary Care Physician:  Reubin Milan, MD Primary Gastroenterologist:  Dr. Servando Snare  Pre-Procedure History & Physical: HPI:  Heather Dudley is a 62 y.o. female is here for a screening colonoscopy.   Past Medical History:  Diagnosis Date   A-fib (HCC)    Anemia    Back pain    Basal cell carcinoma of forehead    birthmark of forehead   Blood in stool 05/13/2021   Breast cancer (HCC)    Breast screening, unspecified    Cellulitis and abscess of trunk    Chest pain    Diastolic dysfunction    a. 05/2019 Echo: EF 55-60%, no rwma, mild LVH, Gr2 DD, Nl RV size/fxn. Triv MR.   Edema of both lower extremities    Fatty liver    GERD (gastroesophageal reflux disease)    Hernia    History of chemotherapy 2012   Hot flashes related to aromatase inhibitor therapy 09/08/2015   Hypertension    Hypothyroidism    Joint pain    Lump or mass in breast    Malignant neoplasm of breast (female), unspecified site    She underwent wide excision, mastoplasty and axillary dissection for her left breast malignancy on September 03, 2010. The primary tumor was 1 cm in diameter, and a single positive axillary node 1.3 cm in diameter. This was an ER-positive, PR slightly positive, HER-2/neu not overexpressing tumor. She tolerated her whole breast radiation without difficulty ending in late February 2013.   Malignant neoplasm of upper-outer quadrant of female breast (HCC)    Neoplasm of uncertain behavior of connective and other soft tissue    Obesity, unspecified    PAF (paroxysmal atrial fibrillation) (HCC) 05/04/2017   a. 04/2019 Zio: RSR, avg HR 79. 2% PAF burden (73-175, avg 115; longest 28secs). CHA2DS2VASc = 2-->Xarelto.   Palpitations    Personal history of chemotherapy    Personal history of malignant neoplasm of breast    The patient underwent wide excision, mastoplasty.  Dissection for a T1b, N1, M0 carcinoma left breast on September 03, 2010.The primary tumor was 1 cm in diameter, and a single positive axillary node 1.3 cm in diameter. This was an ER-positive, PR slightly positive, HER-2/neu not overexpressing tumor.  The patient completed whole breast radiation in February 2013.   Personal history of radiation therapy 2013   LEFT lumpectomy   Pre-diabetes    Screening for obesity    Sleep apnea    SOB (shortness of breath)    Thyroid nodule     Past Surgical History:  Procedure Laterality Date   ABDOMINAL HYSTERECTOMY  2011   ovaries remain   ATRIAL FIBRILLATION ABLATION N/A 01/12/2022   Procedure: ATRIAL FIBRILLATION ABLATION;  Surgeon: Lanier Prude, MD;  Location: [facility] INVASIVE CV LAB;  Service: Cardiovascular;  Laterality: N/A;   birth mark removed     BREAST CYST ASPIRATION Right    negative 01/2010   BREAST EXCISIONAL BIOPSY  08/05/2010   left breast positive 07/2010   BREAST LUMPECTOMY  2012   left breast   COLONOSCOPY  07/29/2012   normal study.   LEFT HEART CATH AND CORONARY ANGIOGRAPHY N/A 12/08/2021   Procedure: LEFT HEART CATH AND CORONARY ANGIOGRAPHY;  Surgeon: Iran Ouch, MD;  Location: [facility] INVASIVE CV LAB;  Service: Cardiovascular;  Laterality: N/A;   robotic tonsillectomy Lingual tonsils Bilateral 06/08/2016  THYROID SURGERY     partially removed   TUBAL LIGATION      Prior to Admission medications   Medication Sig Start Date End Date Taking? Authorizing Provider  amLODipine (NORVASC) 5 MG tablet Take 1 tablet (5 mg total) by mouth as needed (For chest pain). 12/04/22 03/04/23 Yes Gollan, Tollie Pizza, MD  metoprolol succinate (TOPROL-XL) 50 MG 24 hr tablet Take 1.5 tablets (75 mg total) by mouth 2 (two) times daily. Take with or immediately following a meal. 03/30/22  Yes Gollan, Tollie Pizza, MD  potassium chloride SA (KLOR-CON M) 20 MEQ tablet Take 1 tablet (20 mEq total) by mouth daily. 12/04/22  Yes Gollan, Tollie Pizza, MD   apixaban (ELIQUIS) 5 MG TABS tablet Take 1 tablet (5 mg total) by mouth 2 (two) times daily. 10/02/22   Lanier Prude, MD  B Complex-Biotin-FA (B-COMPLEX PO) Take 1 tablet by mouth daily.    [provider]  famotidine (PEPCID) 40 MG tablet Take 1 tablet (40 mg total) by mouth daily. 09/30/22   Reubin Milan, MD  furosemide (LASIX) 20 MG tablet Take 1 tablet (20 mg total) by mouth daily. 10/07/22   Reubin Milan, MD  levothyroxine (SYNTHROID) 75 MCG tablet Take 75 mcg by mouth daily. 10/10/20   [provider]  LORazepam (ATIVAN) 0.5 MG tablet Take 1 tablet (0.5 mg total) by mouth every 8 (eight) hours as needed. for anxiety 09/30/22   Reubin Milan, MD  nitroGLYCERIN (NITROSTAT) 0.4 MG SL tablet Place 1 tablet (0.4 mg total) under the tongue every 5 (five) minutes as needed for chest pain. 12/09/21   Sunnie Nielsen, DO  propafenone (RYTHMOL SR) 225 MG 12 hr capsule Take 225 mg by mouth 2 (two) times daily. 06/04/22   [provider]  rosuvastatin (CRESTOR) 5 MG tablet Take 1 tablet (5 mg total) by mouth daily. 09/30/22   Reubin Milan, MD  Vitamin D, Ergocalciferol, (DRISDOL) 1.25 MG (50000 UNIT) CAPS capsule Take 1 capsule (50,000 Units total) by mouth every 7 (seven) days. 11/04/22   Worthy Rancher, MD    Allergies as of 11/02/2022 - Review Complete 10/21/2022  Allergen Reaction Noted   Ace inhibitors Swelling 12/15/2015   Sernivo [betamethasone dipropionate] Itching 02/14/2018   Penicillins Rash 04/01/2012    Family History  Problem Relation Age of Onset   Cancer Mother        lung ca   High blood pressure Mother    Thyroid disease Mother    Heart failure Father    High blood pressure Father    Heart disease Father    Cancer Maternal Uncle        lung ca   Cancer Paternal Aunt        ovarian ca   Cancer Cousin        female ca   Colon cancer Other    Ovarian cancer Other    Breast cancer Neg Hx     Social History    Socioeconomic History   Marital status: Married    Spouse name: Ossie Cabeza   Number of children: 3   Years of education: 16   Highest education level: Bachelor's degree (e.g., BA, AB, BS)  Occupational History   Occupation: Marine scientist  Tobacco Use   Smoking status: Never   Smokeless tobacco: Never   Tobacco comments:    Never smoke 02/09/22  Vaping Use   Vaping status: Never Used  Substance and Sexual Activity  Alcohol use: Yes    Comment: rarely   Drug use: Never   Sexual activity: Yes    Partners: Male    Birth control/protection: Surgical  Other Topics Concern   Not on file  Social History Narrative   Not on file   Social Determinants of Health   Financial Resource Strain: Low Risk  (05/05/2022)   Overall Financial Resource Strain (CARDIA)    Difficulty of Paying Living Expenses: Not hard at all  Food Insecurity: No Food Insecurity (05/05/2022)   Hunger Vital Sign    Worried About Running Out of Food in the Last Year: Never true    Ran Out of Food in the Last Year: Never true  Transportation Needs: No Transportation Needs (05/05/2022)   PRAPARE - Administrator, Civil Service (Medical): No    Lack of Transportation (Non-Medical): No  Physical Activity: Not on file  Stress: Not on file  Social Connections: Not on file  Intimate Partner Violence: Not At Risk (05/05/2022)   Humiliation, Afraid, Rape, and Kick questionnaire    Fear of Current or Ex-Partner: No    Emotionally Abused: No    Physically Abused: No    Sexually Abused: No    Review of Systems: See HPI, otherwise negative ROS  Physical Exam: BP 139/69   Pulse 65   Temp 97.7 F (36.5 C) (Temporal)   Resp 18   Ht 5\' 7"  (1.702 m)   Wt 119.5 kg   LMP 04/01/2008   SpO2 100%   BMI 41.25 kg/m  General:   Alert,  pleasant and cooperative in NAD Head:  Normocephalic and atraumatic. Neck:  Supple; no masses or thyromegaly. Lungs:  Clear throughout to auscultation.    Heart:   Regular rate and rhythm. Abdomen:  Soft, nontender and nondistended. Normal bowel sounds, without guarding, and without rebound.   Neurologic:  Alert and  oriented x4;  grossly normal neurologically.  Impression/Plan: Heather Dudley is now here to undergo a screening colonoscopy.  Risks, benefits, and alternatives regarding colonoscopy have been reviewed with the patient.  Questions have been answered.  All parties agreeable.

## 2022-12-10 NOTE — Op Note (Addendum)
[facility] Gastroenterology Patient Name: Heather Dudley Procedure Date: 12/10/2022 11:03 AM MRN: [id number] Account #: [id number] Date of Birth: 1961-02-19 Admit Type: Outpatient Age: 62 Room: [facility] ENDO ROOM 1 Gender: Female Note Status: Supervisor Override Instrument Name: Prentice Docker [id number] Procedure:             Colonoscopy Indications:           Screening for colorectal malignant neoplasm Providers:             Midge Minium MD, MD Referring MD:          Bari Edward, MD (Referring MD) Medicines:             Propofol per Anesthesia Complications:         No immediate complications. Procedure:             Pre-Anesthesia Assessment:                        - Prior to the procedure, a History and Physical was                         performed, and patient medications and allergies were                         reviewed. The patient's tolerance of previous                         anesthesia was also reviewed. The risks and benefits                         of the procedure and the sedation options and risks                         were discussed with the patient. All questions were                         answered, and informed consent was obtained. Prior                         Anticoagulants: The patient has taken no anticoagulant                         or antiplatelet agents. ASA Grade Assessment: II - A                         patient with mild systemic disease. After reviewing                         the risks and benefits, the patient was deemed in                         satisfactory condition to undergo the procedure.                        After obtaining informed consent, the colonoscope was                         passed under direct vision. Throughout the procedure,  the patient's blood pressure, pulse, and oxygen                         saturations were monitored continuously. The                         Colonoscope was  introduced through the anus and                         advanced to the the cecum, identified by appendiceal                         orifice and ileocecal valve. The colonoscopy was                         performed without difficulty. The patient tolerated                         the procedure well. The quality of the bowel                         preparation was excellent. Findings:      The perianal and digital rectal examinations were normal.      A 20 mm polyp was found in the cecum. The polyp was sessile.       Preparations were made for mucosal resection. Chromoscopy with indigo       carmine was done. Demarcation of the lesion was performed during the       procedure to clearly identify the boundaries of the lesion. Saline with       indigo carmine was injected to raise the lesion. Snare mucosal resection       was performed. Resection and retrieval were complete. Resected tissue       margins were examined and clear of polyp tissue. To close a defect after       polypectomy, two hemostatic clips were successfully placed (MR       conditional). Clip manufacturer: [employer]. There was no       bleeding at the end of the procedure. Impression:            - One 20 mm polyp in the cecum, removed with mucosal                         resection. Resected and retrieved. Clips (MR                         conditional) were placed. Clip manufacturer: [employer].                        - Mucosal resection was performed. Resection and                         retrieval were complete. Recommendation:        - Discharge patient to home.                        - Resume previous diet.                        -  Continue present medications.                        - Await pathology results.                        - Repeat colonoscopy in 1 year for surveillance. Procedure Code(s):     --- Professional ---                        [phone], Colonoscopy, flexible; with  endoscopic mucosal                         resection Diagnosis Code(s):     --- Professional ---                        Z12.11, Encounter for screening for malignant neoplasm                         of colon                        D12.0, Benign neoplasm of cecum CPT copyright 2022 American Medical Association. All rights reserved. The codes documented in this report are preliminary and upon coder review may  be revised to meet current compliance requirements. Midge Minium MD, MD 12/10/2022 11:25:29 AM This report has been signed electronically. Number of Addenda: 0 Note Initiated On: 12/10/2022 11:03 AM Scope Withdrawal Time: 0 hours 13 minutes 24 seconds  Total Procedure Duration: 0 hours 15 minutes 31 seconds  Estimated Blood Loss:  Estimated blood loss: none.      [facility]

## 2022-12-10 NOTE — Anesthesia Preprocedure Evaluation (Signed)
Anesthesia Evaluation  Patient identified by MRN, date of birth, ID band Patient awake    Reviewed: Allergy & Precautions, NPO status , Patient's Chart, lab work & pertinent test results  Airway Mallampati: III  TM Distance: <3 FB Neck ROM: full    Dental  (+) Teeth Intact   Pulmonary neg pulmonary ROS, sleep apnea and Continuous Positive Airway Pressure Ventilation    Pulmonary exam normal breath sounds clear to auscultation       Cardiovascular Exercise Tolerance: Good hypertension, Pt. on medications + Past MI  negative cardio ROS Normal cardiovascular exam+ dysrhythmias Atrial Fibrillation  Rhythm:Regular Rate:Normal     Neuro/Psych   Anxiety     negative neurological ROS  negative psych ROS   GI/Hepatic negative GI ROS, Neg liver ROS,GERD  Medicated,,  Endo/Other  negative endocrine ROSHypothyroidism  Morbid obesity  Renal/GU negative Renal ROS  negative genitourinary   Musculoskeletal   Abdominal  (+) + obese  Peds  Hematology negative hematology ROS (+) Blood dyscrasia, anemia   Anesthesia Other Findings Past Medical History: No date: A-fib (HCC) No date: Anemia No date: Back pain No date: Basal cell carcinoma of forehead     Comment:  birthmark of forehead 05/13/2021: Blood in stool No date: Breast cancer (HCC) No date: Breast screening, unspecified No date: Cellulitis and abscess of trunk No date: Chest pain No date: Diastolic dysfunction     Comment:  a. 05/2019 Echo: EF 55-60%, no rwma, mild LVH, Gr2 DD, Nl              RV size/fxn. Triv MR. No date: Edema of both lower extremities No date: Fatty liver No date: GERD (gastroesophageal reflux disease) No date: Hernia 2012: History of chemotherapy 09/08/2015: Hot flashes related to aromatase inhibitor therapy No date: Hypertension No date: Hypothyroidism No date: Joint pain No date: Lump or mass in breast No date: Malignant neoplasm of breast  (female), unspecified site     Comment:  She underwent wide excision, mastoplasty and axillary               dissection for her left breast malignancy on September 03, 2010. The primary tumor was 1 cm in diameter, and a               single positive axillary node 1.3 cm in diameter. This               was an ER-positive, PR slightly positive, HER-2/neu not               overexpressing tumor. She tolerated her whole breast               radiation without difficulty ending in late February               2013. No date: Malignant neoplasm of upper-outer quadrant of female breast  (HCC) No date: Neoplasm of uncertain behavior of connective and other soft  tissue No date: Obesity, unspecified 05/04/2017: PAF (paroxysmal atrial fibrillation) (HCC)     Comment:  a. 04/2019 Zio: RSR, avg HR 79. 2% PAF burden (73-175,               avg 115; longest 28secs). CHA2DS2VASc =               2-->Xarelto. No date: Palpitations No date: Personal history of chemotherapy No date: Personal history of malignant neoplasm  of breast     Comment:  The patient underwent wide excision, mastoplasty.               Dissection for a T1b, N1, M0 carcinoma left breast on               September 03, 2010.The primary tumor was 1 cm in diameter, and              a single positive axillary node 1.3 cm in diameter. This               was an ER-positive, PR slightly positive, HER-2/neu not               overexpressing tumor.  The patient completed whole breast              radiation in February 2013. 2013: Personal history of radiation therapy     Comment:  LEFT lumpectomy No date: Pre-diabetes No date: Screening for obesity No date: Sleep apnea No date: SOB (shortness of breath) No date: Thyroid nodule  Past Surgical History: 2011: ABDOMINAL HYSTERECTOMY     Comment:  ovaries remain 01/12/2022: ATRIAL FIBRILLATION ABLATION; N/A     Comment:  Procedure: ATRIAL FIBRILLATION ABLATION;  Surgeon:                Lanier Prude, MD;  Location: [facility] INVASIVE CV LAB;                Service: Cardiovascular;  Laterality: N/A; No date: birth mark removed No date: BREAST CYST ASPIRATION; Right     Comment:  negative 01/2010 08/05/2010: BREAST EXCISIONAL BIOPSY     Comment:  left breast positive 07/2010 2012: BREAST LUMPECTOMY     Comment:  left breast 07/29/2012: COLONOSCOPY     Comment:  normal study. 12/08/2021: LEFT HEART CATH AND CORONARY ANGIOGRAPHY; N/A     Comment:  Procedure: LEFT HEART CATH AND CORONARY ANGIOGRAPHY;                Surgeon: Iran Ouch, MD;  Location: [facility] INVASIVE               CV LAB;  Service: Cardiovascular;  Laterality: N/A; 06/08/2016: robotic tonsillectomy Lingual tonsils; Bilateral No date: THYROID SURGERY     Comment:  partially removed No date: TUBAL LIGATION  BMI    Body Mass Index: 41.25 kg/m      Reproductive/Obstetrics negative OB ROS                             Anesthesia Physical Anesthesia Plan  ASA: 3  Anesthesia Plan: General   Post-op Pain Management:    Induction:   PONV Risk Score and Plan: Propofol infusion and TIVA  Airway Management Planned: Natural Airway and Nasal Cannula  Additional Equipment:   Intra-op Plan:   Post-operative Plan:   Informed Consent: I have reviewed the patients History and Physical, chart, labs and discussed the procedure including the risks, benefits and alternatives for the proposed anesthesia with the patient or authorized representative who has indicated his/her understanding and acceptance.     Dental Advisory Given  Plan Discussed with: CRNA and Surgeon  Anesthesia Plan Comments:        Anesthesia Quick Evaluation

## 2022-12-10 NOTE — Transfer of Care (Signed)
Immediate Anesthesia Transfer of Care Note  Patient: Heather Dudley  Procedure(s) Performed: COLONOSCOPY WITH PROPOFOL POLYPECTOMY HEMOSTASIS CLIP PLACEMENT HOT HEMOSTASIS (ARGON PLASMA COAGULATION/BICAP)  Patient Location: Endoscopy Unit  Anesthesia Type:General  Level of Consciousness: drowsy  Airway & Oxygen Therapy: Patient Spontanous Breathing  Post-op Assessment: Report given to RN and Post -op Vital signs reviewed and stable  Post vital signs: Reviewed and stable  Last Vitals:  Vitals Value Taken Time  BP 133/63 12/10/22 1128  Temp    Pulse 69 12/10/22 1128  Resp 12   SpO2 99 % 12/10/22 1128  Vitals shown include unfiled device data.  Last Pain:  Vitals:   12/10/22 1018  TempSrc: Temporal  PainSc: 0-No pain         Complications: No notable events documented.

## 2022-12-11 ENCOUNTER — Encounter: Payer: Self-pay | Admitting: Gastroenterology

## 2022-12-11 ENCOUNTER — Emergency Department
Admission: EM | Admit: 2022-12-11 | Discharge: 2022-12-11 | Disposition: A | Discharge status: Home / Self Care | Payer: BC Managed Care – PPO | Attending: Emergency Medicine | Admitting: Emergency Medicine

## 2022-12-11 DIAGNOSIS — K635 Polyp of colon: Secondary | ICD-10-CM | POA: Diagnosis not present

## 2022-12-11 DIAGNOSIS — I4891 Unspecified atrial fibrillation: Secondary | ICD-10-CM | POA: Diagnosis not present

## 2022-12-11 DIAGNOSIS — I110 Hypertensive heart disease with heart failure: Secondary | ICD-10-CM | POA: Insufficient documentation

## 2022-12-11 DIAGNOSIS — I2510 Atherosclerotic heart disease of native coronary artery without angina pectoris: Secondary | ICD-10-CM | POA: Insufficient documentation

## 2022-12-11 DIAGNOSIS — Z9889 Other specified postprocedural states: Secondary | ICD-10-CM

## 2022-12-11 DIAGNOSIS — K922 Gastrointestinal hemorrhage, unspecified: Secondary | ICD-10-CM | POA: Diagnosis not present

## 2022-12-11 DIAGNOSIS — I5030 Unspecified diastolic (congestive) heart failure: Secondary | ICD-10-CM | POA: Diagnosis not present

## 2022-12-11 DIAGNOSIS — Z7901 Long term (current) use of anticoagulants: Secondary | ICD-10-CM | POA: Diagnosis not present

## 2022-12-11 DIAGNOSIS — K625 Hemorrhage of anus and rectum: Secondary | ICD-10-CM | POA: Diagnosis not present

## 2022-12-11 LAB — SURGICAL PATHOLOGY

## 2022-12-11 LAB — BASIC METABOLIC PANEL
Anion gap: 8 (ref 5–15)
BUN: 15 mg/dL (ref 6–20)
CO2: 25 mmol/L (ref 22–32)
Calcium: 8.8 mg/dL — ABNORMAL LOW (ref 8.9–10.3)
Chloride: 104 mmol/L (ref 98–111)
Creatinine, Ser: 0.99 mg/dL (ref 0.44–1.00)
GFR, Estimated: >60 mL/min (ref >60)
Glucose, Bld: 105 mg/dL — ABNORMAL HIGH (ref 70–99)
Potassium: 3.8 mmol/L (ref 3.5–5.1)
Sodium: 137 mmol/L (ref 135–145)

## 2022-12-11 LAB — CBC
HCT: 40.9 % (ref 36.0–46.0)
Hemoglobin: 12.8 g/dL (ref 12.0–15.0)
MCH: 27.4 pg (ref 26.0–34.0)
MCHC: 31.3 g/dL (ref 30.0–36.0)
MCV: 87.4 fL (ref 80.0–100.0)
Platelets: 284 10*3/uL (ref 150–400)
RBC: 4.68 MIL/uL (ref 3.87–5.11)
RDW: 13.8 % (ref 11.5–15.5)
WBC: 9.5 10*3/uL (ref 4.0–10.5)
nRBC: 0 % (ref 0.0–0.2)

## 2022-12-11 NOTE — Progress Notes (Signed)
Patient called back after a post op call. Stated she called the emergency number after bloody stools. She had one large polyp taken off 12/10/22. Notified MD and he recommended she go to the ED

## 2022-12-11 NOTE — Discharge Instructions (Signed)
You should stop your Eliquis for the next couple of days, but may restart it on Monday if you do not have any additional bleeding.  Please return to the ER for reevaluation if you do have any bleeding despite stopping the blood thinner.  You should contact Dr. Annabell Sabal office for follow-up and return to the ER for any other concerning symptoms.

## 2022-12-11 NOTE — ED Provider Notes (Signed)
[facility] Provider Note    Event Date/Time   First MD Initiated Contact with Patient 12/11/22 1207     (approximate)   History   Chief Complaint Rectal Bleeding   HPI  Heather Dudley is a 62 y.o. female with past medical history of hypertension, hyperlipidemia, CAD, atrial fibrillation on Eliquis, and diastolic CHF who presents to the ED complaining of rectal bleeding.  Patient reports that she underwent colonoscopy yesterday, was told that she had a large polyp removed.  She was feeling fine after the procedure, had been holding her Eliquis for 3 days but restarted it a couple of hours after the procedure.  She states that she had a bloody bowel movement at home shortly afterwards, followed by a second bloody bowel movement later in the evening.  She states that the second bowel movement contained less blood and she has not had any blood in her stool since yesterday evening.  She denies any chest pain, shortness of breath, lightheadedness, or dizziness.  She spoke with her GI doctor's office, who recommended she come to the ED for evaluation.     Physical Exam   Triage Vital Signs: ED Triage Vitals  Encounter Vitals Group     BP 12/11/22 0902 127/69     Systolic BP Percentile --      Diastolic BP Percentile --      Pulse Rate 12/11/22 0900 66     Resp 12/11/22 0902 20     Temp 12/11/22 0900 98.2 F (36.8 C)     Temp src --      SpO2 12/11/22 0900 99 %     Weight 12/11/22 0901 262 lb 5.6 oz (119 kg)     Height 12/11/22 0901 5\' 7"  (1.702 m)     Head Circumference --      Peak Flow --      Pain Score 12/11/22 0901 0     Pain Loc --      Pain Education --      Exclude from Growth Chart --     Most recent vital signs: Vitals:   12/11/22 0900 12/11/22 0902  BP:  127/69  Pulse: 66   Resp:  20  Temp: 98.2 F (36.8 C)   SpO2: 99%     Constitutional: Alert and oriented. Eyes: Conjunctivae are normal. Head: Atraumatic. Nose: No  congestion/rhinnorhea. Mouth/Throat: Mucous membranes are moist.  Cardiovascular: Normal rate, regular rhythm. Grossly normal heart sounds.  2+ radial pulses bilaterally. Respiratory: Normal respiratory effort.  No retractions. Lungs CTAB. Gastrointestinal: Soft and nontender. No distention. Musculoskeletal: No lower extremity tenderness nor edema.  Neurologic:  Normal speech and language. No gross focal neurologic deficits are appreciated.    ED Results / Procedures / Treatments   Labs (all labs ordered are listed, but only abnormal results are displayed) Labs Reviewed  BASIC METABOLIC PANEL - Abnormal; Notable for the following components:      Result Value   Glucose, Bld 105 (*)    Calcium 8.8 (*)    All other components within normal limits  CBC     PROCEDURES:  Critical Care performed: No  Procedures   MEDICATIONS ORDERED IN ED: Medications - No data to display   IMPRESSION / MDM / ASSESSMENT AND PLAN / ED COURSE  I reviewed the triage vital signs and the nursing notes.  62 y.o. female with past medical history of hypertension, hyperlipidemia, CAD, atrial fibrillation on Eliquis, and diastolic CHF who presents to the ED complaining of 2 bloody bowel movements yesterday following colonoscopy and polypectomy.  Patient's presentation is most consistent with acute presentation with potential threat to life or bodily function.  Differential diagnosis includes, but is not limited to, lower GI bleed, rectal bleeding, upper GI bleed, anemia.  Patient nontoxic-appearing and in no acute distress, vital signs are unremarkable.  Her abdominal exam is benign and she has not passed any blood since yesterday evening.  I did review notes from her colonoscopy, where she had a 20 mm polyp removed with 2 clips placed.  Despite this, labs are reassuring with no significant anemia, leukocytosis, tract abnormality, or AKI.  With no bleeding since last night  and reassuring labs, ongoing bleeding seems unlikely at this time.  Case discussed with Dr. Norma Fredrickson of GI, who recommends patient holding Eliquis for the next 3 days but agrees with plan for outpatient management.  Patient counseled to follow-up with GI and to return to the ED for new or worsening symptoms, patient agrees with plan.      FINAL CLINICAL IMPRESSION(S) / ED DIAGNOSES   Final diagnoses:  Lower GI bleed  S/P colon polypectomy     Rx / DC Orders   ED Discharge Orders     None        Note:  This document was prepared using Dragon voice recognition software and may include unintentional dictation errors.   Chesley Noon, MD 12/11/22 1300

## 2022-12-11 NOTE — ED Triage Notes (Signed)
Pt to ED for blood in stool x2 after colonoscopy yesterday.

## 2022-12-12 NOTE — Plan of Care (Signed)
[facility] Tonsillectomy/Adenoidectomy, Postoperative PEDS care plan entered in error.

## 2022-12-13 NOTE — Plan of Care (Signed)
[facility] Tonsillectomy/Adenoidectomy, Postoperative PEDS care plan entered in error.

## 2022-12-21 ENCOUNTER — Encounter (INDEPENDENT_AMBULATORY_CARE_PROVIDER_SITE_OTHER): Payer: Self-pay | Admitting: Internal Medicine

## 2022-12-21 ENCOUNTER — Ambulatory Visit (INDEPENDENT_AMBULATORY_CARE_PROVIDER_SITE_OTHER): Payer: BC Managed Care – PPO | Admitting: Internal Medicine

## 2022-12-21 VITALS — BP 136/84 | HR 76 | Temp 97.8°F | Ht 67.0 in | Wt 262.0 lb

## 2022-12-21 DIAGNOSIS — E7800 Pure hypercholesterolemia, unspecified: Secondary | ICD-10-CM

## 2022-12-21 DIAGNOSIS — K760 Fatty (change of) liver, not elsewhere classified: Secondary | ICD-10-CM

## 2022-12-21 DIAGNOSIS — G4733 Obstructive sleep apnea (adult) (pediatric): Secondary | ICD-10-CM

## 2022-12-21 DIAGNOSIS — Z6841 Body Mass Index (BMI) 40.0 and over, adult: Secondary | ICD-10-CM

## 2022-12-21 DIAGNOSIS — R948 Abnormal results of function studies of other organs and systems: Secondary | ICD-10-CM | POA: Diagnosis not present

## 2022-12-21 DIAGNOSIS — E669 Obesity, unspecified: Secondary | ICD-10-CM

## 2022-12-21 DIAGNOSIS — R7303 Prediabetes: Secondary | ICD-10-CM

## 2022-12-21 NOTE — Progress Notes (Unsigned)
Office: [phone]  /  Fax: [phone]  WEIGHT SUMMARY AND BIOMETRICS  Vitals Temp: 97.8 F (36.6 C) BP: 136/84 Pulse Rate: 76 SpO2: 98 %   Anthropometric Measurements Height: 5\' 7"  (1.702 m) Weight: 262 lb (118.8 kg) BMI (Calculated): 41.03 Weight at Last Visit: 263 lb Weight Lost Since Last Visit: 1 lb Weight Gained Since Last Visit: 0 lb Starting Weight: 263 lb Total Weight Loss (lbs): 1 lb (0.454 kg) Peak Weight: 263 lb   Body Composition  Body Fat %: 43.2 % Fat Mass (lbs): 113.2 lbs Muscle Mass (lbs): 141.6 lbs Total Body Water (lbs): 94.4 lbs Visceral Fat Rating : 14    No data recorded Today's Visit #: 4  Starting Date: 10/11/22   HPI  Chief Complaint: OBESITY  Dimitri is here to discuss her progress with her obesity treatment plan. She is on the the Category 3 Plan and states she is following her eating plan approximately 33 % of the time. She states she is getting 5,000 steps 2 times per week.  Interval History:  Since last office visit she has {emweight change:30888}. She reports {EMADHERENCE:28838::"good adherence to reduced calorie nutritional plan."} She has been working on [employer] labels","not skipping meals","increasing protein intake at every meal","drinking more water","making healthier choices","reducing portion sizes","incorporating more whole foods"}  Orexigenic Control: {ACTIONS;DENIES/REPORTS:21021675::"Denies"} problems with appetite and hunger signals.  {ACTIONS;DENIES/REPORTS:21021675::"Denies"} problems with satiety and satiation.  {ACTIONS;DENIES/REPORTS:21021675::"Denies"} problems with eating patterns and portion control.  {ACTIONS;DENIES/REPORTS:21021675::"Denies"} abnormal cravings. {ACTIONS;DENIES/REPORTS:21021675::"Denies"} feeling deprived or restricted.   Barriers identified: low volume of physical activity at present .   Pharmacotherapy for weight loss: She is currently taking  {EMPharmaco:28845}.    ASSESSMENT AND PLAN  TREATMENT PLAN FOR OBESITY:  Recommended Dietary Goals  Elainie is currently in the action stage of change. As such, her goal is to continue weight management plan. She has agreed to: {EMWTLOSSPLAN:29297::"continue current plan"}  Behavioral Intervention  We discussed the following Behavioral Modification Strategies today: {EMWMwtlossstrategies:28914::"continue to work on maintaining a reduced calorie state, getting the recommended amount of protein, incorporating whole foods, making healthy choices, staying well hydrated and practicing mindfulness when eating."}.  Additional resources provided today: {EMadditionalresources:29169::"None"}  Recommended Physical Activity Goals  Takyrah has been advised to work up to 150 minutes of moderate intensity aerobic activity a week and strengthening exercises 2-3 times per week for cardiovascular health, weight loss maintenance and preservation of muscle mass.   She has agreed to :  {EMEXERCISE:28847::"Think about enjoyable ways to increase daily physical activity and overcoming barriers to exercise","Increase physical activity in their day and reduce sedentary time (increase NEAT)."}  Pharmacotherapy We discussed various medication options to help Charesse with her weight loss efforts and we both agreed to : {EMagreedrx:29170::"continue with nutritional and behavioral strategies"}  ASSOCIATED CONDITIONS ADDRESSED TODAY  There are no diagnoses linked to this encounter.  PHYSICAL EXAM:  Blood pressure 136/84, pulse 76, temperature 97.8 F (36.6 C), height 5\' 7"  (1.702 m), weight 262 lb (118.8 kg), last menstrual period 04/01/2008, SpO2 98%. Body mass index is 41.04 kg/m.  General: She is overweight, cooperative, alert, well developed, and in no acute distress. PSYCH: Has normal mood, affect and thought process.   HEENT: EOMI, sclerae are anicteric. Lungs: Normal breathing effort, no  conversational dyspnea. Extremities: No edema.  Neurologic: No gross sensory or motor deficits. No tremors or fasciculations noted.    DIAGNOSTIC DATA REVIEWED:  BMET    Component Value Date/Time   NA 137 12/11/2022 0902   NA 138  09/30/2022 1041   NA 137 02/22/2012 1443   K 3.8 12/11/2022 0902   K 3.6 09/19/2012 1011   CL 104 12/11/2022 0902   CL 101 02/22/2012 1443   CO2 25 12/11/2022 0902   CO2 27 02/22/2012 1443   GLUCOSE 105 (H) 12/11/2022 0902   GLUCOSE 102 (H) 02/22/2012 1443   BUN 15 12/11/2022 0902   BUN 12 09/30/2022 1041   BUN 13 02/22/2012 1443   CREATININE 0.99 12/11/2022 0902   CREATININE 1.09 11/02/2013 0903   CALCIUM 8.8 (L) 12/11/2022 0902   CALCIUM 9.0 02/22/2012 1443   GFRNONAA >60 12/11/2022 0902   GFRNONAA 59 (L) 11/02/2013 0903   GFRAA 68 07/11/2019 1143   GFRAA >60 11/02/2013 0903   Lab Results  Component Value Date   HGBA1C 6.2 (H) 09/30/2022   HGBA1C 5.8 (H) 07/11/2019   Lab Results  Component Value Date   INSULIN 19.6 10/21/2022   Lab Results  Component Value Date   TSH 1.140 12/26/2021   CBC    Component Value Date/Time   WBC 9.5 12/11/2022 0902   RBC 4.68 12/11/2022 0902   HGB 12.8 12/11/2022 0902   HGB 12.8 09/30/2022 1041   HCT 40.9 12/11/2022 0902   HCT 39.4 09/30/2022 1041   PLT 284 12/11/2022 0902   PLT 293 09/30/2022 1041   MCV 87.4 12/11/2022 0902   MCV 84 09/30/2022 1041   MCV 87 11/02/2013 0903   MCH 27.4 12/11/2022 0902   MCHC 31.3 12/11/2022 0902   RDW 13.8 12/11/2022 0902   RDW 13.5 09/30/2022 1041   RDW 13.9 11/02/2013 0903   Iron Studies No results found for: "IRON", "TIBC", "FERRITIN", "IRONPCTSAT" Lipid Panel     Component Value Date/Time   CHOL 247 (H) 09/30/2022 1041   TRIG 87 09/30/2022 1041   HDL 46 09/30/2022 1041   CHOLHDL 5.4 (H) 09/30/2022 1041   LDLCALC 186 (H) 09/30/2022 1041   Hepatic Function Panel     Component Value Date/Time   PROT 6.9 09/30/2022 1041   PROT 7.6 11/02/2013 0903    ALBUMIN 4.1 09/30/2022 1041   ALBUMIN 3.5 11/02/2013 0903   AST 18 09/30/2022 1041   AST 18 11/02/2013 0903   ALT 11 09/30/2022 1041   ALT 20 11/02/2013 0903   ALKPHOS 96 09/30/2022 1041   ALKPHOS 97 11/02/2013 0903   BILITOT 0.4 09/30/2022 1041   BILITOT 0.4 11/02/2013 0903   BILIDIR <0.1 (L) 11/06/2015 1528   IBILI NOT CALCULATED 11/06/2015 1528      Component Value Date/Time   TSH 1.140 12/26/2021 1303   TSH 2.430 07/15/2021 1039   Nutritional Lab Results  Component Value Date   VD25OH 13.2 (L) 10/21/2022   VD25OH 20.0 (L) 07/01/2016   VD25OH 30.5 08/21/2015     No follow-ups on file.[name] She was informed of the importance of frequent follow up visits to maximize her success with intensive lifestyle modifications for her multiple health conditions.   ATTESTASTION STATEMENTS:  Reviewed by clinician on day of visit: allergies, medications, problem list, medical history, surgical history, family history, social history, and previous encounter notes.     Worthy Rancher, MD

## 2022-12-22 NOTE — Assessment & Plan Note (Signed)
On CPAP with reported good compliance. Continue PAP therapy. Losing 15% or more of body weight may improve AHI.    

## 2022-12-22 NOTE — Assessment & Plan Note (Addendum)
See obesity treatment plan.  A significant amount of time was spent today on nutritional counseling.  She was also provided instructions on tracking macros.  We discussed 30% protein, 40% carbohydrates and 30% fats

## 2022-12-22 NOTE — Assessment & Plan Note (Signed)
Detected on ultrasound in 2013.  Most recent liver enzymes are within normal limits.   Fibrosis 4 Score = 1.11 (Low risk)        Interpretation for patients with NAFLD          <1.30       -  F0-F1 (Low risk)          1.30-2.67 -  Indeterminate           >2.67      -  F3-F4 (High risk)     Validated for ages 37-65   No further work-up. Losing 15% may improve conditions also avoiding alcohol and reducing simple and processed carbs.  She may also benefit from incretin therapy if not cost prohibitive.  We will explore at the next office visit

## 2022-12-22 NOTE — Assessment & Plan Note (Signed)
Most recent A1c is  Lab Results  Component Value Date   HGBA1C 6.2 (H) 09/30/2022   HGBA1C 5.8 (H) 07/11/2019    Patient aware of disease state and risk of progression. This may contribute to abnormal cravings, fatigue and diabetic complications without having diabetes.   We have discussed treatment options which include: losing 7 to 10% of body weight, increasing physical activity to a goal of 150 minutes a week at moderate intensity.  Advised to maintain a diet low on simple and processed carbohydrates.  She will work on increasing complex carbs and minimizing simple sugars starchy vegetables.  She may also be a candidate for pharmacoprophylaxis with metformin or incretin mimetic.  Interested in medications we will review further at the next office visit

## 2022-12-22 NOTE — Assessment & Plan Note (Signed)
LDL is not at goal. Elevated LDL may be secondary to nutrition, genetics and spillover effect from excess adiposity. Recommended LDL goal is <70 to reduce the risk of fatty streaks and the progression to obstructive ASCVD in the future.    Her 10 year risk is: The ASCVD Risk score (Arnett DK, et al., 2019) failed to calculate for the following reasons:   The patient has a prior MI or stroke diagnosis  Lab Results  Component Value Date   CHOL 247 (H) 09/30/2022   HDL 46 09/30/2022   LDLCALC 186 (H) 09/30/2022   TRIG 87 09/30/2022   CHOLHDL 5.4 (H) 09/30/2022    Continue weight loss therapy, losing 10% or more of body weight may improve condition. Also advised to reduce saturated fats in diet to less than 10% of daily calories.    She is currently on rosuvastatin 5 mg a day but has been experiencing some neurological side effects which may be affecting adherence.   She has vitamin D def which may affect tolerance. Will start supplementation.   Her LDL cholesterol is really high this is suspicious for familial hypercholesterolemia.

## 2022-12-22 NOTE — Assessment & Plan Note (Signed)
Patient has a slower than predicted metabolism. IC 1814 vs. calculated 2017. This may contribute to weight gain, chronic fatigue and difficulty losing weight.  We reviewed measures to improve metabolism including not skipping meals, progressive strengthening exercises, increasing protein intake at every meal and maintaining adequate hydration and sleep.

## 2022-12-29 ENCOUNTER — Telehealth (HOSPITAL_COMMUNITY): Payer: Self-pay | Admitting: *Deleted

## 2022-12-29 NOTE — Telephone Encounter (Signed)
Attempted to call patient regarding upcoming cardiac CT appointment. °Left message on voicemail with name and callback number ° °Edie Vallandingham RN Navigator Cardiac Imaging °[facility] °[phone] Office °[phone] Cell ° °

## 2022-12-30 ENCOUNTER — Ambulatory Visit
Admission: RE | Admit: 2022-12-30 | Discharge: 2022-12-30 | Disposition: A | Discharge status: Home / Self Care | Payer: BC Managed Care – PPO | Source: Ambulatory Visit | Attending: Cardiology | Admitting: Cardiology

## 2022-12-30 ENCOUNTER — Ambulatory Visit (HOSPITAL_COMMUNITY): Admission: RE | Admit: 2022-12-30 | Payer: BC Managed Care – PPO | Source: Ambulatory Visit

## 2022-12-30 DIAGNOSIS — Z79899 Other long term (current) drug therapy: Secondary | ICD-10-CM | POA: Diagnosis not present

## 2022-12-30 DIAGNOSIS — I483 Typical atrial flutter: Secondary | ICD-10-CM | POA: Insufficient documentation

## 2022-12-30 DIAGNOSIS — I10 Essential (primary) hypertension: Secondary | ICD-10-CM | POA: Diagnosis present

## 2022-12-30 DIAGNOSIS — I4819 Other persistent atrial fibrillation: Secondary | ICD-10-CM | POA: Insufficient documentation

## 2022-12-30 DIAGNOSIS — G4733 Obstructive sleep apnea (adult) (pediatric): Secondary | ICD-10-CM | POA: Insufficient documentation

## 2022-12-30 MED ORDER — IOHEXOL 350 MG/ML SOLN
100.0000 mL | Freq: Once | INTRAVENOUS | Status: AC | PRN
  Administered 2022-12-30: 100 mL via INTRAVENOUS

## 2022-12-30 MED ORDER — SODIUM CHLORIDE 0.9 % IV BOLUS
150.0000 mL | Freq: Once | INTRAVENOUS | Status: AC
  Administered 2022-12-30: 150 mL via INTRAVENOUS

## 2023-01-08 NOTE — Pre-Procedure Instructions (Signed)
Instructed patient on the following items: Arrival time 0800 Nothing to eat or drink after midnight No meds AM of procedure Responsible person to drive you home and stay with you for 24 hrs  Have you missed any doses of anti-coagulant Eliquis- takes twice a day, hasn't missed any doses.  Don't take dose on Monday morning.

## 2023-01-11 ENCOUNTER — Other Ambulatory Visit (HOSPITAL_COMMUNITY): Payer: Self-pay

## 2023-01-11 ENCOUNTER — Ambulatory Visit (HOSPITAL_COMMUNITY): Payer: BC Managed Care – PPO | Admitting: Certified Registered Nurse Anesthetist

## 2023-01-11 ENCOUNTER — Encounter (HOSPITAL_COMMUNITY): Payer: Self-pay | Admitting: Cardiology

## 2023-01-11 ENCOUNTER — Other Ambulatory Visit: Payer: Self-pay

## 2023-01-11 ENCOUNTER — Ambulatory Visit (HOSPITAL_COMMUNITY)
Admission: RE | Admit: 2023-01-11 | Discharge: 2023-01-11 | Disposition: A | Discharge status: Home / Self Care | Payer: BC Managed Care – PPO | Source: Ambulatory Visit | Attending: Cardiology | Admitting: Cardiology

## 2023-01-11 ENCOUNTER — Encounter (HOSPITAL_COMMUNITY)
Admission: RE | Disposition: A | Discharge status: Home / Self Care | Payer: BC Managed Care – PPO | Source: Ambulatory Visit | Attending: Cardiology

## 2023-01-11 DIAGNOSIS — G4733 Obstructive sleep apnea (adult) (pediatric): Secondary | ICD-10-CM | POA: Diagnosis not present

## 2023-01-11 DIAGNOSIS — I10 Essential (primary) hypertension: Secondary | ICD-10-CM | POA: Diagnosis not present

## 2023-01-11 DIAGNOSIS — I480 Paroxysmal atrial fibrillation: Secondary | ICD-10-CM

## 2023-01-11 DIAGNOSIS — I4819 Other persistent atrial fibrillation: Secondary | ICD-10-CM | POA: Insufficient documentation

## 2023-01-11 DIAGNOSIS — I483 Typical atrial flutter: Secondary | ICD-10-CM | POA: Diagnosis not present

## 2023-01-11 HISTORY — PX: ATRIAL FIBRILLATION ABLATION: EP1191

## 2023-01-11 LAB — BASIC METABOLIC PANEL
Anion gap: 7 (ref 5–15)
BUN: 15 mg/dL (ref 6–20)
CO2: 24 mmol/L (ref 22–32)
Calcium: 8.7 mg/dL — ABNORMAL LOW (ref 8.9–10.3)
Chloride: 105 mmol/L (ref 98–111)
Creatinine, Ser: 0.98 mg/dL (ref 0.44–1.00)
GFR, Estimated: >60 mL/min (ref >60)
Glucose, Bld: 106 mg/dL — ABNORMAL HIGH (ref 70–99)
Potassium: 3.8 mmol/L (ref 3.5–5.1)
Sodium: 136 mmol/L (ref 135–145)

## 2023-01-11 LAB — CBC
HCT: 40 % (ref 36.0–46.0)
Hemoglobin: 12.5 g/dL (ref 12.0–15.0)
MCH: 27.3 pg (ref 26.0–34.0)
MCHC: 31.3 g/dL (ref 30.0–36.0)
MCV: 87.3 fL (ref 80.0–100.0)
Platelets: 264 10*3/uL (ref 150–400)
RBC: 4.58 MIL/uL (ref 3.87–5.11)
RDW: 13.8 % (ref 11.5–15.5)
WBC: 8.3 10*3/uL (ref 4.0–10.5)
nRBC: 0 % (ref 0.0–0.2)

## 2023-01-11 LAB — GLUCOSE, CAPILLARY
Glucose-Capillary: 100 mg/dL — ABNORMAL HIGH (ref 70–99)
Glucose-Capillary: 106 mg/dL — ABNORMAL HIGH (ref 70–99)

## 2023-01-11 LAB — POCT ACTIVATED CLOTTING TIME: Activated Clotting Time: 279 s

## 2023-01-11 SURGERY — ATRIAL FIBRILLATION ABLATION
Anesthesia: General

## 2023-01-11 MED ORDER — PANTOPRAZOLE SODIUM 40 MG PO TBEC
40.0000 mg | DELAYED_RELEASE_TABLET | Freq: Every day | ORAL | Status: DC
  Administered 2023-01-11: 40 mg via ORAL
  Filled 2023-01-11 (×2): qty 1

## 2023-01-11 MED ORDER — PROPOFOL 10 MG/ML IV BOLUS
INTRAVENOUS | Status: DC | PRN
  Administered 2023-01-11: 150 mg via INTRAVENOUS

## 2023-01-11 MED ORDER — SODIUM CHLORIDE 0.9 % IV SOLN: INTRAVENOUS | Status: DC

## 2023-01-11 MED ORDER — PHENYLEPHRINE HCL-NACL 20-0.9 MG/250ML-% IV SOLN
INTRAVENOUS | Status: DC | PRN
  Administered 2023-01-11: 25 ug/min via INTRAVENOUS

## 2023-01-11 MED ORDER — COLCHICINE 0.6 MG PO TABS
0.6000 mg | ORAL_TABLET | Freq: Two times a day (BID) | ORAL | Status: DC
  Administered 2023-01-11: 0.6 mg via ORAL
  Filled 2023-01-11 (×2): qty 1

## 2023-01-11 MED ORDER — HEPARIN SODIUM (PORCINE) 1000 UNIT/ML IJ SOLN
INTRAMUSCULAR | Status: DC | PRN
  Administered 2023-01-11: 6000 [IU] via INTRAVENOUS
  Administered 2023-01-11: 18000 [IU] via INTRAVENOUS

## 2023-01-11 MED ORDER — ROCURONIUM BROMIDE 10 MG/ML (PF) SYRINGE
PREFILLED_SYRINGE | INTRAVENOUS | Status: DC | PRN
  Administered 2023-01-11: 20 mg via INTRAVENOUS
  Administered 2023-01-11: 50 mg via INTRAVENOUS

## 2023-01-11 MED ORDER — ONDANSETRON HCL 4 MG/2ML IJ SOLN
4.0000 mg | Freq: Four times a day (QID) | INTRAMUSCULAR | Status: DC | PRN
  Administered 2023-01-11: 4 mg via INTRAVENOUS
  Filled 2023-01-11: qty 2

## 2023-01-11 MED ORDER — SODIUM CHLORIDE 0.9% FLUSH: 3.0000 mL | Freq: Two times a day (BID) | INTRAVENOUS | Status: DC

## 2023-01-11 MED ORDER — COLCHICINE 0.6 MG PO TABS
0.6000 mg | ORAL_TABLET | Freq: Two times a day (BID) | ORAL | 0 refills | Status: AC
  Filled 2023-01-11: qty 10, 5d supply, fill #0

## 2023-01-11 MED ORDER — DEXAMETHASONE SODIUM PHOSPHATE 10 MG/ML IJ SOLN
INTRAMUSCULAR | Status: DC | PRN
  Administered 2023-01-11: 10 mg via INTRAVENOUS

## 2023-01-11 MED ORDER — SODIUM CHLORIDE 0.9% FLUSH: 3.0000 mL | INTRAVENOUS | Status: DC | PRN

## 2023-01-11 MED ORDER — PROTAMINE SULFATE 10 MG/ML IV SOLN
INTRAVENOUS | Status: DC | PRN
  Administered 2023-01-11: 35 mg via INTRAVENOUS

## 2023-01-11 MED ORDER — SODIUM CHLORIDE 0.9 % IV SOLN: 250.0000 mL | INTRAVENOUS | Status: DC | PRN

## 2023-01-11 MED ORDER — ACETAMINOPHEN 325 MG PO TABS
650.0000 mg | ORAL_TABLET | ORAL | Status: DC | PRN
Start: 2023-01-11 — End: 2023-01-11
  Administered 2023-01-11: 650 mg via ORAL
  Filled 2023-01-11: qty 2

## 2023-01-11 MED ORDER — SUGAMMADEX SODIUM 200 MG/2ML IV SOLN
INTRAVENOUS | Status: DC | PRN
  Administered 2023-01-11: 200 mg via INTRAVENOUS

## 2023-01-11 MED ORDER — ONDANSETRON HCL 4 MG/2ML IJ SOLN
INTRAMUSCULAR | Status: DC | PRN
  Administered 2023-01-11: 4 mg via INTRAVENOUS

## 2023-01-11 MED ORDER — PANTOPRAZOLE SODIUM 40 MG PO TBEC
40.0000 mg | DELAYED_RELEASE_TABLET | Freq: Every day | ORAL | 0 refills | Status: DC
  Filled 2023-01-11: qty 30, 30d supply, fill #0

## 2023-01-11 MED ORDER — FENTANYL CITRATE (PF) 250 MCG/5ML IJ SOLN
INTRAMUSCULAR | Status: DC | PRN
  Administered 2023-01-11: 100 ug via INTRAVENOUS

## 2023-01-11 MED ORDER — ATROPINE SULFATE 1 MG/10ML IJ SOSY
PREFILLED_SYRINGE | INTRAMUSCULAR | Status: DC | PRN
  Administered 2023-01-11: 1 mg via INTRAVENOUS

## 2023-01-11 MED ORDER — APIXABAN 5 MG PO TABS
5.0000 mg | ORAL_TABLET | Freq: Two times a day (BID) | ORAL | Status: DC
  Administered 2023-01-11: 5 mg via ORAL
  Filled 2023-01-11: qty 1

## 2023-01-11 MED ORDER — HEPARIN (PORCINE) IN NACL 1000-0.9 UT/500ML-% IV SOLN
INTRAVENOUS | Status: DC | PRN
  Administered 2023-01-11 (×3): 500 mL

## 2023-01-11 MED ORDER — LIDOCAINE 2% (20 MG/ML) 5 ML SYRINGE
INTRAMUSCULAR | Status: DC | PRN
  Administered 2023-01-11: 60 mg via INTRAVENOUS

## 2023-01-11 MED ORDER — MIDAZOLAM HCL 2 MG/2ML IJ SOLN
INTRAMUSCULAR | Status: DC | PRN
  Administered 2023-01-11: 2 mg via INTRAVENOUS

## 2023-01-11 SURGICAL SUPPLY — 18 items
BAG SNAP BAND KOVER 36X36 (MISCELLANEOUS) ×1
CABLE PFA RX CATH CONN (CABLE) ×1
CATH 8FR REPROCESSED SOUNDSTAR (CATHETERS) ×1 IMPLANT
CATH FARAWAVE ABLATION 31 (CATHETERS) ×1
CATH OCTARAY 2.0 F 3-3-3-3-3 (CATHETERS) ×1
CATH WEB BI DIR CSDF CRV REPRO (CATHETERS) ×1
CLOSURE PERCLOSE PROSTYLE (VASCULAR PRODUCTS) ×4
COVER SWIFTLINK CONNECTOR (BAG) ×1
DILATOR VESSEL 38 20CM 16FR (INTRODUCER) ×1
INQWIRE 1.5J .035X260CM (WIRE) ×1 IMPLANT
PACK EP LF (CUSTOM PROCEDURE TRAY) ×1
PAD DEFIB RADIO PHYSIO CONN (PAD) ×1
PATCH CARTO3 (PAD) ×1
SHEATH FARADRIVE STEERABLE (SHEATH) ×1
SHEATH PINNACLE 8F 10CM (SHEATH) ×2
SHEATH PINNACLE 9F 10CM (SHEATH) ×1
SHEATH PROBE COVER 6X72 (BAG) ×1
SHEATH WIRE KIT BAYLIS SL1 (KITS) ×1

## 2023-01-11 NOTE — H&P (Signed)
Electrophysiology Office Follow up Visit Note:     Date:  01/11/2023    ID:  Heather Dudley, DOB 23-May-1960, MRN [id number]   PCP:  Heather Milan, MD             [facility] Cardiologist:  Julien Nordmann, MD  [facility] Electrophysiologist:  Lanier Prude, MD      Interval History:     Heather Dudley is a 62 y.o. female who presents for a follow up visit.    Last seen 05/20/2022. Had a PVI 01/12/2022. Did well post op. Stopped propafenone 05/20/2022.  She wore a heart monitor that resulted 5/20224 and showed recurrent AF/AFL.   She brings in her [facility] today.  She is continuing to have symptomatic episodes of atrial fibrillation despite treatment with twice daily propafenone.  She feels fatigue and palpitations when she is out of rhythm.    Presents for PVI today. Procedure reviewed.    Objective Past medical, surgical, social and family history were reviewed.   ROS:   Please see the history of present illness.    All other systems reviewed and are negative.   EKGs/Labs/Other Studies Reviewed:     The following studies were reviewed today:   06/25/2022 zio personally reviewed HR 60 - 184 bpm, average 85 bpm. 233 nonsustained SVT, longest 10 beats. 19% burden of AFL/AF, average rate 117 bpm. Frequent supraventricular ectopy, 9%. Rare ventricular ectopy.     EKG Interpretation   Date/Time:                  Thursday August 20 2022 08:03:22 EDT Ventricular Rate:         102 PR Interval:                   QRS Duration:96 QT Interval:                 362 QTC Calculation:471 R Axis:                         -35 Text Interpretation:Atrial flutter with variable A-V block Low voltage QRS Atrial flutter has replaced Sinus rhythm Confirmed by Heather Dudley [phone]) on 08/20/2022 8:16:54 AM     Physical Exam:     VS:  BP 121/60   Pulse 64   Ht 5\' 7"  (1.702 m)   Wt 266 lb 12.8 oz (121 kg)   LMP 04/01/2008   SpO2 99%   BMI 41.79 kg/m          Wt Readings from Last 3 Encounters:  08/20/22 266 lb 12.8 oz (121 kg)  06/18/22 262 lb (118.8 kg)  05/20/22 263 lb (119.3 kg)      GEN:  Well nourished, well developed in no acute distress CARDIAC: RRR, tachycardic, no murmurs, rubs, gallops RESPIRATORY:  Clear to auscultation without rales, wheezing or rhonchi          Assessment ASSESSMENT:     1. Persistent atrial fibrillation (HCC)   2. Typical atrial flutter (HCC)   3. OSA on CPAP   4. Essential hypertension   5. Encounter for long-term (current) use of high-risk medication     PLAN:     In order of problems listed above:   #Persistent AF/AFL #High risk med monitoring-propafenone S/p prior ablation 01/12/2022 during which the veins, posterior wall and CTI were ablated. Now with recurrence.   She is back on her propafenone and is  still having salvos of symptomatic atrial fibrillation.  QRS duration acceptable for continued propafenone use.   Discussed treatment options today for AF including antiarrhythmic drug therapy and ablation. Discussed risks, recovery and likelihood of success with each treatment strategy. Risk, benefits, and alternatives to EP study and radiofrequency ablation for afib were discussed. These risks include but are not limited to stroke, bleeding, vascular damage, tamponade, perforation, damage to the esophagus, lungs, phrenic nerve and other structures, pulmonary vein stenosis, worsening renal function, and death.  Discussed potential need for repeat ablation procedures and antiarrhythmic drugs after an initial ablation. The patient understands these risk and wishes to proceed.  We will therefore proceed with catheter ablation at the next available time.  Carto, ICE, anesthesia are requested for the procedure.  Will also obtain CT PV protocol prior to the procedure to exclude LAA thrombus and further evaluate atrial anatomy.     #OSA CPAP use encouraged.   #Hypertension At goal today.  Recommend  checking blood pressures 1-2 times per week at home and recording the values.  Recommend bringing these recordings to the primary care physician.      Presents for PVI today. Procedure reviewed.     Signed, Heather Dunn, MD, [facility], [facility] 01/11/2023 Electrophysiology [facility]

## 2023-01-11 NOTE — Anesthesia Procedure Notes (Signed)
Procedure Name: Intubation Date/Time: 01/11/2023 9:52 AM  Performed by: Garfield Cornea, CRNAPre-anesthesia Checklist: Patient identified, Emergency Drugs available, Suction available and Patient being monitored Patient Re-evaluated:Patient Re-evaluated prior to induction Oxygen Delivery Method: Circle System Utilized Preoxygenation: Pre-oxygenation with 100% oxygen Induction Type: IV induction Ventilation: Mask ventilation without difficulty and Oral airway inserted - appropriate to patient size Laryngoscope Size: Glidescope and 3 Grade View: Grade I Tube type: Oral Tube size: 7.0 mm Number of attempts: 1 Airway Equipment and Method: Oral airway, Rigid stylet and Video-laryngoscopy Placement Confirmation: ETT inserted through vocal cords under direct vision, positive ETCO2 and breath sounds checked- equal and bilateral Secured at: 22 cm Tube secured with: Tape Dental Injury: Teeth and Oropharynx as per pre-operative assessment  Comments: Elective glidescope intubation.

## 2023-01-11 NOTE — Anesthesia Postprocedure Evaluation (Signed)
Anesthesia Post Note  Patient: Heather Dudley  Procedure(s) Performed: ATRIAL FIBRILLATION ABLATION     Patient location during evaluation: PACU Anesthesia Type: General Level of consciousness: awake and alert Pain management: pain level controlled Vital Signs Assessment: post-procedure vital signs reviewed and stable Respiratory status: spontaneous breathing, nonlabored ventilation and respiratory function stable Cardiovascular status: blood pressure returned to baseline and stable Postop Assessment: no apparent nausea or vomiting Anesthetic complications: no  No notable events documented.  Last Vitals:  Vitals:   01/11/23 1230 01/11/23 1245  BP: 121/69 128/70  Pulse: 75 75  Resp: 19 (!) 23  Temp:    SpO2: 96% 94%    Last Pain:  Vitals:   01/11/23 1217  TempSrc:   PainSc: 0-No pain                 Malloree Raboin,W. EDMOND

## 2023-01-11 NOTE — Discharge Instructions (Signed)

## 2023-01-11 NOTE — Transfer of Care (Signed)
Immediate Anesthesia Transfer of Care Note  Patient: Heather Dudley  Procedure(s) Performed: ATRIAL FIBRILLATION ABLATION  Patient Location: Cath Lab  Anesthesia Type:General  Level of Consciousness: drowsy  Airway & Oxygen Therapy: Patient Spontanous Breathing  Post-op Assessment: Report given to RN and Post -op Vital signs reviewed and stable  Post vital signs: Reviewed and stable  Last Vitals: see last anesthesia VS. Bedside report given to RN in EP lab. VSS. Vitals Value Taken Time  BP    Temp    Pulse    Resp    SpO2      Last Pain:  Vitals:   01/11/23 0931  TempSrc: Temporal         Complications: No notable events documented.

## 2023-01-11 NOTE — Anesthesia Preprocedure Evaluation (Addendum)
Anesthesia Evaluation  Patient identified by MRN, date of birth, ID band Patient awake    Reviewed: Allergy & Precautions, H&P , NPO status , Patient's Chart, lab work & pertinent test results, reviewed documented beta blocker date and time   Airway Mallampati: III  TM Distance: >3 FB Neck ROM: Full    Dental no notable dental hx. (+) Teeth Intact, Dental Advisory Given   Pulmonary sleep apnea and Continuous Positive Airway Pressure Ventilation    Pulmonary exam normal breath sounds clear to auscultation       Cardiovascular hypertension, Pt. on medications and Pt. on home beta blockers + dysrhythmias Atrial Fibrillation  Rhythm:Regular Rate:Normal     Neuro/Psych   Anxiety     negative neurological ROS     GI/Hepatic Neg liver ROS,GERD  Medicated,,  Endo/Other  Hypothyroidism    Renal/GU negative Renal ROS  negative genitourinary   Musculoskeletal   Abdominal   Peds  Hematology  (+) Blood dyscrasia, anemia   Anesthesia Other Findings   Reproductive/Obstetrics negative OB ROS                             Anesthesia Physical Anesthesia Plan  ASA: 3  Anesthesia Plan: General   Post-op Pain Management: Tylenol PO (pre-op)*   Induction: Intravenous  PONV Risk Score and Plan: 4 or greater and Ondansetron and Dexamethasone  Airway Management Planned: Oral ETT  Additional Equipment:   Intra-op Plan:   Post-operative Plan: Extubation in OR  Informed Consent: I have reviewed the patients History and Physical, chart, labs and discussed the procedure including the risks, benefits and alternatives for the proposed anesthesia with the patient or authorized representative who has indicated his/her understanding and acceptance.     Dental advisory given  Plan Discussed with: CRNA  Anesthesia Plan Comments:        Anesthesia Quick Evaluation

## 2023-01-12 ENCOUNTER — Encounter (HOSPITAL_COMMUNITY): Payer: Self-pay | Admitting: Cardiology

## 2023-01-18 ENCOUNTER — Encounter (INDEPENDENT_AMBULATORY_CARE_PROVIDER_SITE_OTHER): Payer: Self-pay | Admitting: Internal Medicine

## 2023-01-18 ENCOUNTER — Ambulatory Visit (INDEPENDENT_AMBULATORY_CARE_PROVIDER_SITE_OTHER): Payer: BC Managed Care – PPO | Admitting: Internal Medicine

## 2023-01-18 VITALS — BP 142/74 | HR 73 | Temp 97.9°F | Ht 67.0 in | Wt 257.0 lb

## 2023-01-18 DIAGNOSIS — K760 Fatty (change of) liver, not elsewhere classified: Secondary | ICD-10-CM

## 2023-01-18 DIAGNOSIS — G4733 Obstructive sleep apnea (adult) (pediatric): Secondary | ICD-10-CM | POA: Diagnosis not present

## 2023-01-18 DIAGNOSIS — Z6841 Body Mass Index (BMI) 40.0 and over, adult: Secondary | ICD-10-CM

## 2023-01-18 DIAGNOSIS — Z789 Other specified health status: Secondary | ICD-10-CM

## 2023-01-18 DIAGNOSIS — E782 Mixed hyperlipidemia: Secondary | ICD-10-CM | POA: Diagnosis not present

## 2023-01-18 DIAGNOSIS — R7303 Prediabetes: Secondary | ICD-10-CM

## 2023-01-18 DIAGNOSIS — R948 Abnormal results of function studies of other organs and systems: Secondary | ICD-10-CM | POA: Diagnosis not present

## 2023-01-18 DIAGNOSIS — E66813 Obesity, class 3: Secondary | ICD-10-CM

## 2023-01-18 NOTE — Progress Notes (Unsigned)
Office: [phone]  /  Fax: [phone]  Weight Summary And Biometrics  Vitals Temp: 97.9 F (36.6 C) BP: (!) 146/86 Pulse Rate: 73 SpO2: 99 %   Anthropometric Measurements Height: 5\' 7"  (1.702 m) Weight: 257 lb (116.6 kg) BMI (Calculated): 40.24 Weight at Last Visit: 262 lb Weight Lost Since Last Visit: 5 lb Weight Gained Since Last Visit: 0 lb Starting Weight: 263 lb Total Weight Loss (lbs): 6 lb (2.722 kg) Peak Weight: 263 lb   Body Composition  Body Fat %: 42 % Fat Mass (lbs): 108.2 lbs Muscle Mass (lbs): 141.6 lbs Total Body Water (lbs): 88.2 lbs Visceral Fat Rating : 14    No data recorded Today's Visit #: 5  Starting Date: 10/11/22   Subjective   Chief Complaint: Obesity  Heather Dudley is here to discuss her progress with her obesity treatment plan. She is on the the Category 3 Plan and states she is following her eating plan approximately 50 % of the time. She states she is not exercising.  Interval History:   Discussed the use of AI scribe software for clinical note transcription with the patient, who gave verbal consent to proceed.       Orexigenic Control:  {ACTIONS;DENIES/REPORTS:21021675::"Denies"} problems with appetite and hunger signals.  {ACTIONS;DENIES/REPORTS:21021675::"Denies"} problems with satiety and satiation.  {ACTIONS;DENIES/REPORTS:21021675::"Denies"} problems with eating patterns and portion control.  {ACTIONS;DENIES/REPORTS:21021675::"Denies"} abnormal cravings. {ACTIONS;DENIES/REPORTS:21021675::"Denies"} feeling deprived or restricted.   Barriers identified: {EMOBESITYBARRIERS:28841::"none"}.   Pharmacotherapy for weight loss: She is currently taking {EMPharmaco:28845}.   Assessment and Plan   Treatment Plan For Obesity:  Recommended Dietary Goals  Heather Dudley is currently in the action stage of change. As such, her goal is to continue weight management plan. She has agreed to: {EMWTLOSSPLAN:29297::"continue current  plan"}  Behavioral Intervention  We discussed the following Behavioral Modification Strategies today: {EMWMwtlossstrategies:28914::"continue to work on maintaining a reduced calorie state, getting the recommended amount of protein, incorporating whole foods, making healthy choices, staying well hydrated and practicing mindfulness when eating."}.  Additional resources provided today: {EMadditionalresources:29169::"None"}  Recommended Physical Activity Goals  Heather Dudley has been advised to work up to 150 minutes of moderate intensity aerobic activity a week and strengthening exercises 2-3 times per week for cardiovascular health, weight loss maintenance and preservation of muscle mass.   She has agreed to :  {EMEXERCISE:28847::"Think about enjoyable ways to increase daily physical activity and overcoming barriers to exercise","Increase physical activity in their day and reduce sedentary time (increase NEAT)."}  Pharmacotherapy  We discussed various medication options to help Heather Dudley with her weight loss efforts and we both agreed to : {EMagreedrx:29170::"continue with nutritional and behavioral strategies"}  Associated Conditions Addressed Today  There are no diagnoses linked to this encounter.   Objective   Physical Exam:  Blood pressure (!) 146/86, pulse 73, temperature 97.9 F (36.6 C), height 5\' 7"  (1.702 m), weight 257 lb (116.6 kg), last menstrual period 04/01/2008, SpO2 99%. Body mass index is 40.25 kg/m.  General: She is overweight, cooperative, alert, well developed, and in no acute distress. PSYCH: Has normal mood, affect and thought process.   HEENT: EOMI, sclerae are anicteric. Lungs: Normal breathing effort, no conversational dyspnea. Extremities: No edema.  Neurologic: No gross sensory or motor deficits. No tremors or fasciculations noted.    Diagnostic Data Reviewed:  BMET    Component Value Date/Time   NA 136 01/11/2023 0836   NA 138 09/30/2022 1041   NA 137  02/22/2012 1443   K 3.8 01/11/2023 0836   K 3.6  09/19/2012 1011   CL 105 01/11/2023 0836   CL 101 02/22/2012 1443   CO2 24 01/11/2023 0836   CO2 27 02/22/2012 1443   GLUCOSE 106 (H) 01/11/2023 0836   GLUCOSE 102 (H) 02/22/2012 1443   BUN 15 01/11/2023 0836   BUN 12 09/30/2022 1041   BUN 13 02/22/2012 1443   CREATININE 0.98 01/11/2023 0836   CREATININE 1.09 11/02/2013 0903   CALCIUM 8.7 (L) 01/11/2023 0836   CALCIUM 9.0 02/22/2012 1443   GFRNONAA >60 01/11/2023 0836   GFRNONAA 59 (L) 11/02/2013 0903   GFRAA 68 07/11/2019 1143   GFRAA >60 11/02/2013 0903   Lab Results  Component Value Date   HGBA1C 6.2 (H) 09/30/2022   HGBA1C 5.8 (H) 07/11/2019   Lab Results  Component Value Date   INSULIN 19.6 10/21/2022   Lab Results  Component Value Date   TSH 1.140 12/26/2021   CBC    Component Value Date/Time   WBC 8.3 01/11/2023 0836   RBC 4.58 01/11/2023 0836   HGB 12.5 01/11/2023 0836   HGB 12.8 09/30/2022 1041   HCT 40.0 01/11/2023 0836   HCT 39.4 09/30/2022 1041   PLT 264 01/11/2023 0836   PLT 293 09/30/2022 1041   MCV 87.3 01/11/2023 0836   MCV 84 09/30/2022 1041   MCV 87 11/02/2013 0903   MCH 27.3 01/11/2023 0836   MCHC 31.3 01/11/2023 0836   RDW 13.8 01/11/2023 0836   RDW 13.5 09/30/2022 1041   RDW 13.9 11/02/2013 0903   Iron Studies No results found for: "IRON", "TIBC", "FERRITIN", "IRONPCTSAT" Lipid Panel     Component Value Date/Time   CHOL 247 (H) 09/30/2022 1041   TRIG 87 09/30/2022 1041   HDL 46 09/30/2022 1041   CHOLHDL 5.4 (H) 09/30/2022 1041   LDLCALC 186 (H) 09/30/2022 1041   Hepatic Function Panel     Component Value Date/Time   PROT 6.9 09/30/2022 1041   PROT 7.6 11/02/2013 0903   ALBUMIN 4.1 09/30/2022 1041   ALBUMIN 3.5 11/02/2013 0903   AST 18 09/30/2022 1041   AST 18 11/02/2013 0903   ALT 11 09/30/2022 1041   ALT 20 11/02/2013 0903   ALKPHOS 96 09/30/2022 1041   ALKPHOS 97 11/02/2013 0903   BILITOT 0.4 09/30/2022 1041   BILITOT  0.4 11/02/2013 0903   BILIDIR <0.1 (L) 11/06/2015 1528   IBILI NOT CALCULATED 11/06/2015 1528      Component Value Date/Time   TSH 1.140 12/26/2021 1303   TSH 2.430 07/15/2021 1039   Nutritional Lab Results  Component Value Date   VD25OH 13.2 (L) 10/21/2022   VD25OH 20.0 (L) 07/01/2016   VD25OH 30.5 08/21/2015    Follow-Up   No follow-ups on file.[name] She was informed of the importance of frequent follow up visits to maximize her success with intensive lifestyle modifications for her multiple health conditions.  Attestation Statement   Reviewed by clinician on day of visit: allergies, medications, problem list, medical history, surgical history, family history, social history, and previous encounter notes.     Worthy Rancher, MD

## 2023-01-18 NOTE — Assessment & Plan Note (Signed)
LDL is not at goal. Elevated LDL may be secondary to nutrition, genetics and spillover effect from excess adiposity. Recommended LDL goal is <70 to reduce the risk of fatty streaks and the progression to obstructive ASCVD in the future.   Her 10 year risk is: The ASCVD Risk score (Arnett DK, et al., 2019) failed to calculate for the following reasons:   The patient has a prior MI or stroke diagnosis  Lab Results  Component Value Date   CHOL 247 (H) 09/30/2022   HDL 46 09/30/2022   LDLCALC 186 (H) 09/30/2022   TRIG 87 09/30/2022   CHOLHDL 5.4 (H) 09/30/2022    Continue weight loss therapy, losing 10% or more of body weight may improve condition. Also advised to reduce saturated fats in diet to less than 10% of daily calories.

## 2023-01-19 NOTE — Assessment & Plan Note (Signed)
On CPAP with reported good compliance. Continue PAP therapy. Losing 15% or more of body weight may improve AHI.    She would also benefit from GLP-1 therapy for weight management.

## 2023-01-19 NOTE — Assessment & Plan Note (Signed)
She is on Zetia for hypercholesterolemia, intolerant to statins. Heart catheterization showed no obstructive coronary artery disease. Metformin might also help with cholesterol levels. We will recheck cholesterol levels at the next visit and consider metformin for additional cholesterol management.  Continue to reduce saturated fats in diet.

## 2023-01-19 NOTE — Assessment & Plan Note (Signed)
Patient has a slower than predicted metabolism. IC 1814 vs. calculated 2017. This may contribute to weight gain, chronic fatigue and difficulty losing weight.   We reviewed measures to improve metabolism including not skipping meals, progressive strengthening exercises, increasing protein intake at every meal and maintaining adequate hydration and sleep.   She is recovering colonoscopy cardiac ablation and will resume physical activity in the near future.

## 2023-01-19 NOTE — Assessment & Plan Note (Signed)
With an A1c of 6.2 and a family history of diabetes, we discussed the potential benefits of metformin, including weight loss, reduced cravings, and prevention of diabetes. Given its affordability, safety, and effectiveness in lowering insulin levels, we will consider starting metformin at the next visit.  She is also a candidate for incretin therapy if not cost prohibitive.

## 2023-01-19 NOTE — Assessment & Plan Note (Signed)
Detected on ultrasound in 2013.  Most recent liver enzymes are within normal limits.   Fibrosis 4 Score = 1.11 (Low risk)        Interpretation for patients with NAFLD          <1.30       -  F0-F1 (Low risk)          1.30-2.67 -  Indeterminate           >2.67      -  F3-F4 (High risk)     Validated for ages 24-65   No further work-up. Losing 15% may improve conditions also avoiding alcohol and reducing simple and processed carbs.  She may also benefit from incretin therapy if not cost prohibitive.  We will explore at the next office visit

## 2023-01-19 NOTE — Assessment & Plan Note (Signed)
See obesity treatment plan.  Discussed antiobesity medications at the next office visit.

## 2023-01-23 ENCOUNTER — Other Ambulatory Visit (INDEPENDENT_AMBULATORY_CARE_PROVIDER_SITE_OTHER): Payer: Self-pay | Admitting: Internal Medicine

## 2023-01-23 DIAGNOSIS — E559 Vitamin D deficiency, unspecified: Secondary | ICD-10-CM

## 2023-01-29 ENCOUNTER — Other Ambulatory Visit: Payer: Self-pay | Admitting: Internal Medicine

## 2023-01-29 ENCOUNTER — Encounter: Payer: Self-pay | Admitting: Internal Medicine

## 2023-01-29 ENCOUNTER — Telehealth: Payer: Self-pay | Admitting: Internal Medicine

## 2023-01-29 DIAGNOSIS — K769 Liver disease, unspecified: Secondary | ICD-10-CM

## 2023-01-29 NOTE — Telephone Encounter (Signed)
Patient informed.  Heather Dudley 

## 2023-01-29 NOTE — Telephone Encounter (Signed)
Copied from CRM [id number]. Topic: General - Other >> Jan 29, 2023  9:55 AM Turkey B wrote: Reason for CRM: pt called in wanting Dr Judithann Graves will check back in her records about a cyst on her liver. She feels likes she has it for a while and nothing was done about it

## 2023-02-01 ENCOUNTER — Encounter: Payer: Self-pay | Admitting: Internal Medicine

## 2023-02-01 ENCOUNTER — Ambulatory Visit: Payer: BC Managed Care – PPO | Admitting: Internal Medicine

## 2023-02-01 VITALS — BP 132/78 | HR 73 | Ht 67.0 in | Wt 265.0 lb

## 2023-02-01 DIAGNOSIS — I480 Paroxysmal atrial fibrillation: Secondary | ICD-10-CM | POA: Diagnosis not present

## 2023-02-01 DIAGNOSIS — R7303 Prediabetes: Secondary | ICD-10-CM

## 2023-02-01 DIAGNOSIS — D1803 Hemangioma of intra-abdominal structures: Secondary | ICD-10-CM | POA: Diagnosis not present

## 2023-02-01 DIAGNOSIS — E782 Mixed hyperlipidemia: Secondary | ICD-10-CM | POA: Diagnosis not present

## 2023-02-01 NOTE — Assessment & Plan Note (Addendum)
LDL is  Lab Results  Component Value Date   LDLCALC 186 (H) 09/30/2022   Current regimen is Zetia.  She was prescribed Crestor but felt that it triggered her Afib.  She will resume Crestor and monitor symptoms.  She is not interested in Repatha. Will get new lipid baseline today.

## 2023-02-01 NOTE — Assessment & Plan Note (Signed)
Has had another ablation since September and only one episode since then that lasted only one hour. Rythmol has been discontinued since then.

## 2023-02-01 NOTE — Assessment & Plan Note (Addendum)
Patient with recent CT showing liver lesion - has not been evaluated since 2019 when CT showed it to be a hemangioma unchanged from 2013.  Lesion appears unchanged in size. [place] has been ordered but is probably not necessary. Discussed at length with patient and will cancel the [place].

## 2023-02-01 NOTE — Assessment & Plan Note (Addendum)
Controlled with diet only. Lab Results  Component Value Date   HGBA1C 6.2 (H) 09/30/2022  I recommend consideration be given to Jardiance in view of her cardiac history. Recheck A1C today.

## 2023-02-01 NOTE — Progress Notes (Signed)
Date:  02/01/2023   Name:  Heather Dudley   DOB:  [birth year]   MRN:  [id number]   Chief Complaint: Hyperlipidemia  Hyperlipidemia This is a chronic problem. The problem is uncontrolled. Pertinent negatives include no shortness of breath. Current antihyperlipidemic treatment includes ezetimibe.  Diabetes She presents for her follow-up diabetic visit. Diabetes type: prediabetes. Her disease course has been stable. Pertinent negatives for hypoglycemia include no dizziness, headaches or nervousness/anxiousness. Pertinent negatives for diabetes include no fatigue. Symptoms are stable. Current diabetic treatment includes diet (she is also working with medical weight loss.).  Hemangioma of liver - Lesion noted on recent CT chest.  Also noted in 2019 with CT evaluation showing a stable lesion as far back as 2013.  Patient was concerned so [place] was ordered - scheduled for tomorrow.  Review of Systems  Constitutional:  Negative for chills, fatigue and fever.  Respiratory:  Negative for chest tightness and shortness of breath.   Gastrointestinal:  Negative for abdominal pain and constipation.  Neurological:  Negative for dizziness, light-headedness and headaches.  Psychiatric/Behavioral:  Negative for dysphoric mood and sleep disturbance. The patient is not nervous/anxious.      Lab Results  Component Value Date   NA 136 01/11/2023   K 3.8 01/11/2023   CO2 24 01/11/2023   GLUCOSE 106 (H) 01/11/2023   BUN 15 01/11/2023   CREATININE 0.98 01/11/2023   CALCIUM 8.7 (L) 01/11/2023   EGFR 67 09/30/2022   GFRNONAA >60 01/11/2023   Lab Results  Component Value Date   CHOL 247 (H) 09/30/2022   HDL 46 09/30/2022   LDLCALC 186 (H) 09/30/2022   TRIG 87 09/30/2022   CHOLHDL 5.4 (H) 09/30/2022   Lab Results  Component Value Date   TSH 1.140 12/26/2021   Lab Results  Component Value Date   HGBA1C 6.2 (H) 09/30/2022   Lab Results  Component Value Date   WBC 8.3 01/11/2023   HGB 12.5  01/11/2023   HCT 40.0 01/11/2023   MCV 87.3 01/11/2023   PLT 264 01/11/2023   Lab Results  Component Value Date   ALT 11 09/30/2022   AST 18 09/30/2022   ALKPHOS 96 09/30/2022   BILITOT 0.4 09/30/2022   Lab Results  Component Value Date   VD25OH 13.2 (L) 10/21/2022     Patient Active Problem List   Diagnosis Date Noted   Statin intolerance 01/18/2023   Polyp of colon 12/10/2022   Colon cancer screening 12/10/2022   Pure hypercholesterolemia 10/21/2022   Class 3 severe obesity with serious comorbidity and body mass index (BMI) of 40.0 to 44.9 in adult (HCC) 10/21/2022   Metabolic dysfunction-associated steatotic liver disease (MASLD) 10/21/2022   Abnormal metabolism 10/21/2022   Vertigo 09/30/2022   Gastroesophageal reflux disease 09/30/2022   Generalized anxiety disorder 05/05/2022   Paroxysmal atrial fibrillation (HCC) 03/29/2022   Acquired thrombophilia (HCC) 03/18/2022   Chronic diastolic heart failure (HCC) 03/18/2022   Prediabetes 12/16/2021   CAD (coronary artery disease), native coronary artery 12/06/2021   Lymphedema of left arm 09/23/2021   Nevus of neck 09/23/2021   Peroneal tendinitis, left 09/30/2020   OSA on CPAP 07/12/2020   Breast cyst, right 05/08/2020   Status post hysterectomy 04/03/2020   Depression screen 07/13/2019   Multiple thyroid nodules 08/23/2018   Mixed hyperlipidemia 07/06/2018   Microscopic hematuria 05/04/2018   Liver hemangioma 03/26/2017   Panic disorder 06/15/2016   Macroglossia 02/05/2016   Environmental and seasonal allergies 01/02/2016   Vitamin  D deficiency 04/19/2015   Cervical radiculopathy 04/17/2015   Essential hypertension 04/17/2015   History of partial thyroidectomy 04/17/2015   Obesity, Class II, BMI 35-39.9 04/17/2015   Lipoma of axilla 10/24/2014   History of left breast cancer 09/02/2010    Allergies  Allergen Reactions   Ace Inhibitors Swelling   Sernivo [Betamethasone Dipropionate] Itching   Penicillins  Rash    Has patient had a PCN reaction causing immediate rash, facial/tongue/throat swelling, SOB or lightheadedness with hypotension: Yes Has patient had a PCN reaction causing severe rash involving mucus membranes or skin necrosis: No Has patient had a PCN reaction that required hospitalization: No Has patient had a PCN reaction occurring within the last 10 years: No If all of the above answers are "NO", then may proceed with Cephalosporin use.    Past Surgical History:  Procedure Laterality Date   ABDOMINAL HYSTERECTOMY  2011   ovaries remain   ATRIAL FIBRILLATION ABLATION N/A 01/12/2022   Procedure: ATRIAL FIBRILLATION ABLATION;  Surgeon: Lanier Prude, MD;  Location: [facility];  Service: Cardiovascular;  Laterality: N/A;   ATRIAL FIBRILLATION ABLATION N/A 01/11/2023   Procedure: ATRIAL FIBRILLATION ABLATION;  Surgeon: Lanier Prude, MD;  Location: [facility];  Service: Cardiovascular;  Laterality: N/A;   birth mark removed     BREAST CYST ASPIRATION Right    negative 01/2010   BREAST EXCISIONAL BIOPSY  08/05/2010   left breast positive 07/2010   BREAST LUMPECTOMY  2012   left breast   COLONOSCOPY  07/29/2012   normal study.   COLONOSCOPY WITH PROPOFOL N/A 12/10/2022   Procedure: COLONOSCOPY WITH PROPOFOL;  Surgeon: Midge Minium, MD;  Location: [facility] ENDOSCOPY;  Service: Endoscopy;  Laterality: N/A;   HEMOSTASIS CLIP PLACEMENT  12/10/2022   Procedure: HEMOSTASIS CLIP PLACEMENT;  Surgeon: Midge Minium, MD;  Location: [facility] ENDOSCOPY;  Service: Endoscopy;;   HOT HEMOSTASIS  12/10/2022   Procedure: HOT HEMOSTASIS (ARGON PLASMA COAGULATION/BICAP);  Surgeon: Midge Minium, MD;  Location: [facility] ENDOSCOPY;  Service: Endoscopy;;   LEFT HEART CATH AND CORONARY ANGIOGRAPHY N/A 12/08/2021   Procedure: LEFT HEART CATH AND CORONARY ANGIOGRAPHY;  Surgeon: Iran Ouch, MD;  Location: [facility] INVASIVE CV LAB;  Service: Cardiovascular;  Laterality: N/A;   POLYPECTOMY   12/10/2022   Procedure: POLYPECTOMY;  Surgeon: Midge Minium, MD;  Location: [facility] ENDOSCOPY;  Service: Endoscopy;;   robotic tonsillectomy Lingual tonsils Bilateral 06/08/2016   THYROID SURGERY     partially removed   TUBAL LIGATION      Social History   Tobacco Use   Smoking status: Never   Smokeless tobacco: Never   Tobacco comments:    Never smoke 02/09/22  Vaping Use   Vaping status: Never Used  Substance Use Topics   Alcohol use: Yes    Comment: rarely   Drug use: Never     Medication list has been reviewed and updated.  Current Meds  Medication Sig   amLODipine (NORVASC) 5 MG tablet Take 1 tablet (5 mg total) by mouth as needed (For chest pain).   apixaban (ELIQUIS) 5 MG TABS tablet Take 1 tablet (5 mg total) by mouth 2 (two) times daily.   B Complex-Biotin-FA (B-COMPLEX PO) Take 1 tablet by mouth daily.   colchicine 0.6 MG tablet Take 1 tablet (0.6 mg total) by mouth 2 (two) times daily for 5 days.   ezetimibe (ZETIA) 10 MG tablet Take 10 mg by mouth daily.   famotidine (PEPCID) 40 MG tablet Take  1 tablet (40 mg total) by mouth daily.   furosemide (LASIX) 20 MG tablet Take 1 tablet (20 mg total) by mouth daily.   levothyroxine (SYNTHROID) 75 MCG tablet Take 75 mcg by mouth daily.   LORazepam (ATIVAN) 0.5 MG tablet Take 1 tablet (0.5 mg total) by mouth every 8 (eight) hours as needed. for anxiety   metoprolol succinate (TOPROL-XL) 50 MG 24 hr tablet Take 1.5 tablets (75 mg total) by mouth 2 (two) times daily. Take with or immediately following a meal.   nitroGLYCERIN (NITROSTAT) 0.4 MG SL tablet Place 1 tablet (0.4 mg total) under the tongue every 5 (five) minutes as needed for chest pain.   pantoprazole (PROTONIX) 40 MG tablet Take 1 tablet (40 mg total) by mouth daily.   potassium chloride SA (KLOR-CON M) 20 MEQ tablet Take 1 tablet (20 mEq total) by mouth daily.   Vitamin D, Ergocalciferol, (DRISDOL) 1.25 MG (50000 UNIT) CAPS capsule Take 1 capsule (50,000 Units  total) by mouth every 7 (seven) days.       02/01/2023   11:02 AM 09/30/2022    9:57 AM 06/18/2022    8:49 AM 05/05/2022    1:44 PM  GAD 7 : Generalized Anxiety Score  Nervous, Anxious, on Edge 0 0 0 2  Control/stop worrying 0 0 0 2  Worry too much - different things 0 0 0 2  Trouble relaxing 1 1 2 1   Restless 1 1 2  0  Easily annoyed or irritable 0 1 0 1  Afraid - awful might happen 0 0 0 1  Total GAD 7 Score 2 3 4 9   Anxiety Difficulty Not difficult at all Not difficult at all Not difficult at all Somewhat difficult       02/01/2023   11:02 AM 09/30/2022    9:57 AM 06/18/2022    8:49 AM  Depression screen PHQ 2/9  Decreased Interest 0 0 1  Down, Depressed, Hopeless 0 0 1  PHQ - 2 Score 0 0 2  Altered sleeping 3 2 2   Tired, decreased energy 2 2 2   Change in appetite 1 1 3   Feeling bad or failure about yourself  0 0 1  Trouble concentrating 1 1 1   Moving slowly or fidgety/restless 1 1 0  Suicidal thoughts 0 0 0  PHQ-9 Score 8 7 11   Difficult doing work/chores Not difficult at all Not difficult at all Somewhat difficult    BP Readings from Last 3 Encounters:  02/01/23 132/78  01/18/23 (!) 142/74  01/11/23 129/68    Physical Exam Vitals and nursing note reviewed.  Constitutional:      General: She is not in acute distress.    Appearance: Normal appearance. She is well-developed.  HENT:     Head: Normocephalic and atraumatic.  Cardiovascular:     Rate and Rhythm: Normal rate and regular rhythm.  Pulmonary:     Effort: Pulmonary effort is normal. No respiratory distress.     Breath sounds: No wheezing or rhonchi.  Musculoskeletal:     Cervical back: Normal range of motion.     Right lower leg: No edema.     Left lower leg: No edema.  Lymphadenopathy:     Cervical: No cervical adenopathy.  Skin:    General: Skin is warm and dry.     Findings: No rash.  Neurological:     General: No focal deficit present.     Mental Status: She is alert and oriented to person,  place,  and time.  Psychiatric:        Mood and Affect: Mood normal.        Behavior: Behavior normal.     Wt Readings from Last 3 Encounters:  02/01/23 265 lb (120.2 kg)  01/18/23 257 lb (116.6 kg)  01/11/23 260 lb (117.9 kg)    BP 132/78   Pulse 73   Ht 5\' 7"  (1.702 m)   Wt 265 lb (120.2 kg)   LMP 04/01/2008   SpO2 100%   BMI 41.50 kg/m   Assessment and Plan:  Problem List Items Addressed This Visit       Unprioritized   Mixed hyperlipidemia - Primary (Chronic)    LDL is  Lab Results  Component Value Date   LDLCALC 186 (H) 09/30/2022   Current regimen is Zetia.  She was prescribed Crestor but felt that it triggered her Afib.  She will resume Crestor and monitor symptoms.  She is not interested in Repatha. Will get new lipid baseline today.         Relevant Medications   ezetimibe (ZETIA) 10 MG tablet   Other Relevant Orders   Lipid panel   Liver hemangioma    Patient with recent CT showing liver lesion - has not been evaluated since 2019 when CT showed it to be a hemangioma unchanged from 2013.  Lesion appears unchanged in size. [place] has been ordered but is probably not necessary. Discussed at length with patient and will cancel the [place].      Relevant Medications   ezetimibe (ZETIA) 10 MG tablet   Other Relevant Orders   Comprehensive metabolic panel   Prediabetes    Controlled with diet only. Lab Results  Component Value Date   HGBA1C 6.2 (H) 09/30/2022  I recommend consideration be given to Jardiance in view of her cardiac history. Recheck A1C today.        Relevant Orders   Hemoglobin A1c   Paroxysmal atrial fibrillation (HCC)    Has had another ablation since September and only one episode since then that lasted only one hour. Rythmol has been discontinued since then.       Relevant Medications   ezetimibe (ZETIA) 10 MG tablet    No follow-ups on file.    Reubin Milan, MD [facility]  [place]

## 2023-02-02 ENCOUNTER — Ambulatory Visit: Payer: BC Managed Care – PPO

## 2023-02-02 LAB — COMPREHENSIVE METABOLIC PANEL
ALT: 14 [IU]/L (ref 0–32)
AST: 13 [IU]/L (ref 0–40)
Albumin: 4.1 g/dL (ref 3.8–4.9)
Alkaline Phosphatase: 98 [IU]/L (ref 44–121)
BUN/Creatinine Ratio: 13 (ref 12–28)
BUN: 12 mg/dL (ref 8–27)
Bilirubin Total: 0.3 mg/dL (ref 0.0–1.2)
CO2: 24 mmol/L (ref 20–29)
Calcium: 9.1 mg/dL (ref 8.7–10.3)
Chloride: 103 mmol/L (ref 96–106)
Creatinine, Ser: 0.93 mg/dL (ref 0.57–1.00)
Globulin, Total: 2.5 g/dL (ref 1.5–4.5)
Glucose: 93 mg/dL (ref 70–99)
Potassium: 4.3 mmol/L (ref 3.5–5.2)
Sodium: 141 mmol/L (ref 134–144)
Total Protein: 6.6 g/dL (ref 6.0–8.5)
eGFR: 70 mL/min/{1.73_m2} (ref >59)

## 2023-02-02 LAB — HEMOGLOBIN A1C
Est. average glucose Bld gHb Est-mCnc: 126 mg/dL
Hgb A1c MFr Bld: 6 % — ABNORMAL HIGH (ref 4.8–5.6)

## 2023-02-02 LAB — LIPID PANEL
Chol/HDL Ratio: 4.7 {ratio} — ABNORMAL HIGH (ref 0.0–4.4)
Cholesterol, Total: 233 mg/dL — ABNORMAL HIGH (ref 100–199)
HDL: 50 mg/dL (ref >39)
LDL Chol Calc (NIH): 165 mg/dL — ABNORMAL HIGH (ref 0–99)
Triglycerides: 101 mg/dL (ref 0–149)
VLDL Cholesterol Cal: 18 mg/dL (ref 5–40)

## 2023-02-03 ENCOUNTER — Ambulatory Visit: Payer: BC Managed Care – PPO | Admitting: Internal Medicine

## 2023-02-10 NOTE — Progress Notes (Unsigned)
Cardiology Clinic Note    Date:  02/11/2023  [name] 06/06/1960, MRN [id number] PCP:  Reubin Milan, MD  Cardiologist:  Julien Nordmann, MD Electrophysiologist: Lanier Prude, MD    Patient Profile    Chief Complaint: AF ablation follow-up  History of Present Illness: Heather Dudley is a 62 y.o. female with PMH notable for persis AFib, typical aflutter, OSA, HTN; seen today for Lanier Prude, MD for routine electrophysiology followup after recent AF ablation.  She is s/p AFib ablation 12/2021 and 12/2022 w redo PVI and redo ablation/isolation of posterior wall.  On follow-up today, she is doing very well from an AFib perspective. She has had one episode of AFib since ablation, it was short in duration, and has been maintaining sinus rhythm since. Her propafenone was stopped post-procedure. She has noticed her heart rate is usually in the 60-90s now that she is off propafenone. She would like to lower metoprolol or stop it altogether.  She checks BP regularly at home, most readings 130s/70s. She saw PCP last week who recommended starting jardiance for BG lowering and BP lowering effects, and she questions whether this is appropriate.   Continues to take eliquis BID, no bleeding concerns.   She denies chest pain, palpitations, dyspnea, PND, orthopnea, nausea, vomiting, dizziness, syncope, edema, weight gain, or early satiety.  No SOB or dizziness with activities   AAD History: Propafenone - stopped 12/2022 post Ablation    ROS:  Please see the history of present illness. All other systems are reviewed and otherwise negative.    Physical Exam    VS:  BP (!) 152/86   Pulse 70   Ht 5\' 7"  (1.702 m)   Wt 266 lb 3.2 oz (120.7 kg)   LMP 04/01/2008   SpO2 99%   BMI 41.69 kg/m  BMI: Body mass index is 41.69 kg/m.  Vitals:   02/11/23 1037 02/11/23 1105  BP: (!) 152/86 138/82  Pulse: 70   Height: 5\' 7"  (1.702 m)   Weight: 266  lb 3.2 oz (120.7 kg)   SpO2: 99%   BMI (Calculated): 41.68      Wt Readings from Last 3 Encounters:  02/11/23 266 lb 3.2 oz (120.7 kg)  02/01/23 265 lb (120.2 kg)  01/18/23 257 lb (116.6 kg)     GEN- The patient is well appearing, alert and oriented x 3 today.   Lungs- Clear to ausculation bilaterally, normal work of breathing.  Heart- Regular rate and rhythm, no murmurs, rubs or gallops Extremities- No peripheral edema, warm, dry    Studies Reviewed   Previous EP, cardiology notes.    EKG is ordered. Personal review of EKG from today shows:    EKG Interpretation Date/Time:  Thursday February 11 2023 10:41:39 EST Ventricular Rate:  70 PR Interval:  190 QRS Duration:  82 QT Interval:  392 QTC Calculation: 423 R Axis:   -22  Text Interpretation: Normal sinus rhythm Minimal voltage criteria for LVH, may be normal variant ( R in aVL ) Confirmed by Sherie Don [phone]) on 02/11/2023 10:49:52 AM    Long ter monitor, 06/23/2022 HR 60 - 184 bpm, average 85 bpm. 233 nonsustained SVT, longest 10 beats. 19% burden of AFL/AF, average rate 117 bpm. Frequent supraventricular ectopy, 9%. Rare ventricular ectopy.  TTE, 12/07/2021  1. Left ventricular ejection fraction, by estimation, is 55 to 60%. The left ventricle has normal function. The left ventricle has no regional wall  motion abnormalities. Left ventricular diastolic parameters were normal.   2. Right ventricular systolic function is normal. The right ventricular size is normal. There is normal pulmonary artery systolic pressure.   3. Left atrial size was mildly dilated.   4. The mitral valve is normal in structure. Trivial mitral valve regurgitation. No evidence of mitral stenosis.   5. The aortic valve is tricuspid. Aortic valve regurgitation is not visualized. No aortic stenosis is present.   6. The inferior vena cava is normal in size with greater than 50% respiratory variability, suggesting right atrial pressure of 3  mmHg.   Comparison(s): No significant change from prior study.    Assessment and Plan     #) persis AFib S/p redo ablation 12/2022 Had one palpitation episode, but overall maintaining sinus rhythm since ablation Continue 75mg  toprol BID at this time, consider reducing at 3mon post-ablation appt   #) Hypercoag d/t persis afib CHA2DS2-VASc Score = at least 3 [CHF History: 0, HTN History: 1, Diabetes History: 0, Stroke History: 0, Vascular Disease History: 1, Age Score: 0, Gender Score: 1].  Therefore, the patient's annual risk of stroke is 3.2 %.    Stroke ppx - 5mg  eliquis BID, appropriately dosed No bleeding concerns   #) HTN Slightly elevated on recheck today in office, also slightly elevated on home readings Continue 5mg  amlodipine Continue toprol as above Continue 20mg  lasix daily Adding jardiance per PCP is very reasonable and appropriate Continue lifestyle modifications including regular physical activity, low sodium diet          Current medicines are reviewed at length with the patient today.   The patient has concerns regarding her medicines.  The following changes were made today:  none  Labs/ tests ordered today include:  Orders Placed This Encounter  Procedures   EKG 12-Lead     Disposition: Follow up with Dr. Lalla Brothers or EP APP  in 2 months    Signed, Sherie Don, NP  02/11/23  11:26 AM  Electrophysiology [facility]

## 2023-02-11 ENCOUNTER — Ambulatory Visit: Payer: BC Managed Care – PPO | Attending: Cardiology | Admitting: Cardiology

## 2023-02-11 ENCOUNTER — Encounter: Payer: Self-pay | Admitting: Internal Medicine

## 2023-02-11 ENCOUNTER — Telehealth: Payer: Self-pay | Admitting: Cardiology

## 2023-02-11 ENCOUNTER — Encounter: Payer: Self-pay | Admitting: Cardiology

## 2023-02-11 VITALS — BP 138/82 | HR 70 | Ht 67.0 in | Wt 266.2 lb

## 2023-02-11 DIAGNOSIS — D6869 Other thrombophilia: Secondary | ICD-10-CM | POA: Diagnosis not present

## 2023-02-11 DIAGNOSIS — I10 Essential (primary) hypertension: Secondary | ICD-10-CM

## 2023-02-11 DIAGNOSIS — G4733 Obstructive sleep apnea (adult) (pediatric): Secondary | ICD-10-CM

## 2023-02-11 DIAGNOSIS — I4819 Other persistent atrial fibrillation: Secondary | ICD-10-CM

## 2023-02-11 NOTE — Telephone Encounter (Signed)
Spoke to patient and informed her that the pantoprazole (PROTONIX) 40 MG tablet is a proton pump inhibitor and that it decreases the amount of acid produced in the stomach. Informed patient she is to continue taking it to reduce the likeliness of having heartburn related chest pain. Patient understood with read back.

## 2023-02-11 NOTE — Telephone Encounter (Signed)
Pt c/o medication issue:  1. Name of Medication: pantoprazole (PROTONIX) 40 MG tablet   2. How are you currently taking this medication (dosage and times per day)?    3. Are you having a reaction (difficulty breathing--STAT)? no  4. What is your medication issue? Patient calling to see why she is on this medication. Please advise

## 2023-02-11 NOTE — Patient Instructions (Addendum)
Medication Instructions:   Your physician recommends that you continue on your current medications as directed. Please refer to the Current Medication list given to you today.  *If you need a refill on your cardiac medications before your next appointment, please call your pharmacy*   Lab Work:  NONE  If you have labs (blood work) drawn today and your tests are completely normal, you will receive your results only by: MyChart Message (if you have MyChart) OR A paper copy in the mail If you have any lab test that is abnormal or we need to change your treatment, we will call you to review the results.   Testing/Procedures:  NONE   Follow-Up: At [facility], you and your health needs are our priority.  As part of our continuing mission to provide you with exceptional heart care, we have created designated Provider Care Teams.  These Care Teams include your primary Cardiologist (physician) and Advanced Practice Providers (APPs -  Physician Assistants and Nurse Practitioners) who all work together to provide you with the care you need, when you need it.  We recommend signing up for the patient portal called "MyChart".  Sign up information is provided on this After Visit Summary.  MyChart is used to connect with patients for Virtual Visits (Telemedicine).  Patients are able to view lab/test results, encounter notes, upcoming appointments, etc.  Non-urgent messages can be sent to your provider as well.   To learn more about what you can do with MyChart, go to [email or web address].    Your next appointment:     Keep scheduled appointment 04/12/2022 @ 11:00 am.  Provider:   Sherie Don, NP

## 2023-03-01 ENCOUNTER — Encounter (INDEPENDENT_AMBULATORY_CARE_PROVIDER_SITE_OTHER): Payer: Self-pay | Admitting: Internal Medicine

## 2023-03-01 ENCOUNTER — Ambulatory Visit (INDEPENDENT_AMBULATORY_CARE_PROVIDER_SITE_OTHER): Payer: BC Managed Care – PPO | Admitting: Internal Medicine

## 2023-03-01 VITALS — BP 138/86 | HR 80 | Temp 98.2°F | Ht 67.0 in | Wt 261.0 lb

## 2023-03-01 DIAGNOSIS — K760 Fatty (change of) liver, not elsewhere classified: Secondary | ICD-10-CM | POA: Diagnosis not present

## 2023-03-01 DIAGNOSIS — E559 Vitamin D deficiency, unspecified: Secondary | ICD-10-CM | POA: Diagnosis not present

## 2023-03-01 DIAGNOSIS — G4733 Obstructive sleep apnea (adult) (pediatric): Secondary | ICD-10-CM | POA: Diagnosis not present

## 2023-03-01 DIAGNOSIS — R7303 Prediabetes: Secondary | ICD-10-CM

## 2023-03-01 DIAGNOSIS — E66813 Obesity, class 3: Secondary | ICD-10-CM

## 2023-03-01 DIAGNOSIS — Z6841 Body Mass Index (BMI) 40.0 and over, adult: Secondary | ICD-10-CM

## 2023-03-01 MED ORDER — VITAMIN D (ERGOCALCIFEROL) 1.25 MG (50000 UNIT) PO CAPS: 50000.0000 [IU] | ORAL_CAPSULE | ORAL | 0 refills | Status: DC

## 2023-03-01 MED ORDER — ZEPBOUND 2.5 MG/0.5ML ~~LOC~~ SOAJ: 2.5000 mg | SUBCUTANEOUS | 0 refills | Status: DC

## 2023-03-01 NOTE — Progress Notes (Signed)
 Office: [phone]  /  Fax: [phone]  Weight Summary And Biometrics  Vitals Temp: 98.2 F (36.8 C) BP: 138/86 Pulse Rate: 80 SpO2: 96 %   Anthropometric Measurements Height: 5' 7 (1.702 m) Weight: 261 lb (118.4 kg) BMI (Calculated): 40.87 Weight at Last Visit: 257 lb Weight Lost Since Last Visit: 0 lb Weight Gained Since Last Visit: 4 lb Starting Weight: 263 lb Total Weight Loss (lbs): 2 lb (0.907 kg) Peak Weight: 264lb   Body Composition  Body Fat %: 42.7 % Fat Mass (lbs): 111.6 lbs Muscle Mass (lbs): 142.2 lbs Total Body Water (lbs): 91.2 lbs Visceral Fat Rating : 14    No data recorded Today's Visit #: 6  Starting Date: 10/11/22   Subjective   Chief Complaint: Obesity  Heather Dudley is here to discuss her progress with her obesity treatment plan. She is on Heather Heather Category 3 Plan and states she is following her eating plan approximately 25 % of Heather time. She states she is not exercising.  Interval History:   Since last office visit she has gained 5 pounds. She reports had a recent lapse due to holidays She has been working on reading food labels, not skipping meals, increasing protein intake at every meal, drinking more water, making healthier choices, reducing portion sizes, and incorporating more whole foods    Heather Dudley:  Reports problems with appetite and hunger signals.  Reports problems with satiety and satiation.  Denies problems with eating patterns and portion Dudley.  Denies abnormal cravings. Denies feeling deprived or restricted.   Barriers identified: strong hunger signals and impaired satiety / inhibitory Dudley, low volume of physical activity at present , medical comorbidities, and sleep apnea.   Pharmacotherapy for weight loss: She is currently taking no anti-obesity medication.   Assessment and Plan   Treatment Plan For Obesity:  Recommended Dietary Goals  Heather Dudley is currently in Heather action stage of change.  As such, her goal is to continue weight management plan. She has agreed to: continue current plan  Behavioral Intervention  We discussed Heather following Behavioral Modification Strategies today: continue to work on maintaining a reduced calorie state, getting Heather recommended amount of protein, incorporating whole foods, making healthy choices, staying well hydrated and practicing mindfulness when eating..  Additional resources provided today: None  Recommended Physical Activity Goals  Heather Dudley has been advised to work up to 150 minutes of moderate intensity aerobic activity a week and strengthening exercises 2-3 times per week for cardiovascular health, weight loss maintenance and preservation of muscle mass.   She has agreed to :  Think about enjoyable ways to increase daily physical activity and overcoming barriers to exercise and Increase physical activity in their day and reduce sedentary time (increase NEAT).  Pharmacotherapy  We discussed various medication options to help Heather Dudley with her weight loss efforts and we both agreed to : start anti-obesity medication.  In addition to reduced calorie nutrition plan (RCNP), behavioral strategies and physical activity, Heather Dudley would benefit from pharmacotherapy to assist with hunger signals, satiety and cravings. This will reduce obesity-related health risks by inducing weight loss, and help reduce food consumption and adherence to Heather Dudley) . It may also improve QOL by improving self-confidence and reduce Heather  setbacks associated with metabolic adaptations.  Patient also has multiple high risk obesity related comorbid conditions including prediabetes, moderate to severe sleep apnea, MASLD, HTN and hyperlipidemia  After discussion of treatment options, mechanisms of action, benefits, side effects, contraindications and shared decision making she  is agreeable to starting Zepbound  2.5 mg once a week. Patient also made aware that medication is indicated  for long-term management of obesity and Heather risk of weight regain following discontinuation of treatment and hence Heather importance of adhering to medical weight loss plan.  We demonstrated use of device and patient using teach back method was able to demonstrate proper technique.  Associated Conditions Addressed and Impacted by Obesity Treatment  OSA on CPAP -     Zepbound ; Inject 2.5 mg into Heather skin once a week.  Dispense: 2 mL; Refill: 0  Vitamin D  deficiency -     Vitamin D  (Ergocalciferol ); Take 1 capsule (50,000 Units total) by mouth every 7 (seven) days.  Dispense: 16 capsule; Refill: 0  Class 3 severe obesity with serious comorbidity and body mass index (BMI) of 40.0 to 44.9 in adult, unspecified obesity type (HCC) -     Zepbound ; Inject 2.5 mg into Heather skin once a week.  Dispense: 2 mL; Refill: 0  Metabolic dysfunction-associated steatotic liver disease (MASLD) -     Zepbound ; Inject 2.5 mg into Heather skin once a week.  Dispense: 2 mL; Refill: 0  Prediabetes -     Zepbound ; Inject 2.5 mg into Heather skin once a week.  Dispense: 2 mL; Refill: 0    Plan:  Patient is affected by obesity has a BMI of 40 with associated comorbidities including moderate to severe sleep apnea on CPAP, prediabetes, MASLD, HTN and hyperlipidemia.  All which increase her cardiovascular risk and risk of future disease specific complications.  She also has strong Heather signaling and impaired inhibitory Dudley and would benefit from early introduction of Heather Dudley.  Zepbound  is now approved for treatment of moderate to severe sleep apnea.  Patient counseled on obesity is a chronic disease, use of Heather Dudley from long-term perspective, we also discussed Heather benefits, common side effects. We demonstrated use of device and patient using teach back method was able to demonstrate proper technique.       Objective   Physical Exam:  Blood pressure 138/86, pulse 80, temperature 98.2 F (36.8 C), height 5'  7 (1.702 m), weight 261 lb (118.4 kg), last menstrual period 04/01/2008, SpO2 96%. Body mass index is 40.88 kg/m.  General: She is overweight, cooperative, alert, well developed, and in no acute distress. PSYCH: Has normal mood, affect and thought process.   HEENT: EOMI, sclerae are anicteric. Lungs: Normal breathing effort, no conversational dyspnea. Extremities: No edema.  Neurologic: No gross sensory or motor deficits. No tremors or fasciculations noted.    Diagnostic Data Reviewed:  BMET    Component Value Date/Time   NA 141 02/01/2023 1154   NA 137 02/22/2012 1443   K 4.3 02/01/2023 1154   K 3.6 09/19/2012 1011   CL 103 02/01/2023 1154   CL 101 02/22/2012 1443   CO2 24 02/01/2023 1154   CO2 27 02/22/2012 1443   GLUCOSE 93 02/01/2023 1154   GLUCOSE 106 (H) 01/11/2023 0836   GLUCOSE 102 (H) 02/22/2012 1443   BUN 12 02/01/2023 1154   BUN 13 02/22/2012 1443   CREATININE 0.93 02/01/2023 1154   CREATININE 1.09 11/02/2013 0903   CALCIUM  9.1 02/01/2023 1154   CALCIUM  9.0 02/22/2012 1443   GFRNONAA >60 01/11/2023 0836   GFRNONAA 59 (L) 11/02/2013 0903   GFRAA 68 07/11/2019 1143   GFRAA >60 11/02/2013 0903   Lab Results  Component Value Date   HGBA1C 6.0 (H) 02/01/2023   HGBA1C 5.8 (H)  07/11/2019   Lab Results  Component Value Date   INSULIN  19.6 10/21/2022   Lab Results  Component Value Date   TSH 1.140 12/26/2021   CBC    Component Value Date/Time   WBC 8.3 01/11/2023 0836   RBC 4.58 01/11/2023 0836   HGB 12.5 01/11/2023 0836   HGB 12.8 09/30/2022 1041   HCT 40.0 01/11/2023 0836   HCT 39.4 09/30/2022 1041   PLT 264 01/11/2023 0836   PLT 293 09/30/2022 1041   MCV 87.3 01/11/2023 0836   MCV 84 09/30/2022 1041   MCV 87 11/02/2013 0903   MCH 27.3 01/11/2023 0836   MCHC 31.3 01/11/2023 0836   RDW 13.8 01/11/2023 0836   RDW 13.5 09/30/2022 1041   RDW 13.9 11/02/2013 0903   Iron Studies No results found for: IRON, TIBC, FERRITIN,  IRONPCTSAT Lipid Panel     Component Value Date/Time   CHOL 233 (H) 02/01/2023 1154   TRIG 101 02/01/2023 1154   HDL 50 02/01/2023 1154   CHOLHDL 4.7 (H) 02/01/2023 1154   LDLCALC 165 (H) 02/01/2023 1154   Hepatic Function Panel     Component Value Date/Time   PROT 6.6 02/01/2023 1154   PROT 7.6 11/02/2013 0903   ALBUMIN 4.1 02/01/2023 1154   ALBUMIN 3.5 11/02/2013 0903   AST 13 02/01/2023 1154   AST 18 11/02/2013 0903   ALT 14 02/01/2023 1154   ALT 20 11/02/2013 0903   ALKPHOS 98 02/01/2023 1154   ALKPHOS 97 11/02/2013 0903   BILITOT 0.3 02/01/2023 1154   BILITOT 0.4 11/02/2013 0903   BILIDIR <0.1 (L) 11/06/2015 1528   IBILI NOT CALCULATED 11/06/2015 1528      Component Value Date/Time   TSH 1.140 12/26/2021 1303   TSH 2.430 07/15/2021 1039   Nutritional Lab Results  Component Value Date   VD25OH 13.2 (L) 10/21/2022   VD25OH 20.0 (L) 07/01/2016   VD25OH 30.5 08/21/2015    Follow-Up   Return in about 4 weeks (around 03/29/2023) for For Weight Mangement with Dr. Francyne.SABRA She was informed of Heather importance of frequent follow up visits to maximize her success with intensive lifestyle modifications for her multiple health conditions.  Attestation Statement   Reviewed by clinician on day of visit: allergies, medications, problem list, medical history, surgical history, family history, social history, and previous encounter notes.     Lucas Francyne, MD

## 2023-03-03 ENCOUNTER — Telehealth: Payer: Self-pay

## 2023-03-03 NOTE — Telephone Encounter (Signed)
 PA submitted through Cover My Meds for Zepbound. Awaiting insurance determination. Key: [id number]

## 2023-03-04 NOTE — Telephone Encounter (Signed)
 Request Reference Number: [id number]. ZEPBOUND INJ 2.5MG  is approved through 08/31/2023. Your patient may now fill this prescription and it will be covered.

## 2023-03-25 ENCOUNTER — Other Ambulatory Visit: Payer: Self-pay | Admitting: Cardiovascular Disease

## 2023-03-29 ENCOUNTER — Ambulatory Visit (INDEPENDENT_AMBULATORY_CARE_PROVIDER_SITE_OTHER): Payer: Self-pay | Admitting: Internal Medicine

## 2023-04-12 ENCOUNTER — Encounter (INDEPENDENT_AMBULATORY_CARE_PROVIDER_SITE_OTHER): Payer: Self-pay | Admitting: Internal Medicine

## 2023-04-12 ENCOUNTER — Ambulatory Visit (INDEPENDENT_AMBULATORY_CARE_PROVIDER_SITE_OTHER): Payer: 59 | Admitting: Internal Medicine

## 2023-04-12 VITALS — BP 144/75 | HR 76 | Temp 98.1°F | Ht 66.0 in | Wt 257.0 lb

## 2023-04-12 DIAGNOSIS — G4733 Obstructive sleep apnea (adult) (pediatric): Secondary | ICD-10-CM

## 2023-04-12 DIAGNOSIS — R7303 Prediabetes: Secondary | ICD-10-CM

## 2023-04-12 DIAGNOSIS — I10 Essential (primary) hypertension: Secondary | ICD-10-CM | POA: Diagnosis not present

## 2023-04-12 DIAGNOSIS — K760 Fatty (change of) liver, not elsewhere classified: Secondary | ICD-10-CM | POA: Diagnosis not present

## 2023-04-12 DIAGNOSIS — E66813 Obesity, class 3: Secondary | ICD-10-CM

## 2023-04-12 DIAGNOSIS — Z6841 Body Mass Index (BMI) 40.0 and over, adult: Secondary | ICD-10-CM

## 2023-04-12 MED ORDER — ZEPBOUND 2.5 MG/0.5ML ~~LOC~~ SOAJ: 2.5000 mg | SUBCUTANEOUS | 0 refills | Status: DC

## 2023-04-12 MED ORDER — MULTI-VITAMIN/MINERALS PO TABS: 1.0000 | ORAL_TABLET | Freq: Every day | ORAL | Status: AC

## 2023-04-12 NOTE — Assessment & Plan Note (Signed)
-   Refer to obesity management plan - Continue with current weight management strategy. - Discussed adjustments to overcome identified barriers. - Reinforce dietary and exercise goals. -Continue Zepbound 2.5 mg once a week at current dose she is having an amplified response do not recommend further increases as it may result in restrictive eating.

## 2023-04-12 NOTE — Assessment & Plan Note (Signed)
 On CPAP with reported good compliance. Continue PAP therapy. Losing 15% or more of body weight may improve AHI.  She is now on Zepbound 2.5 mg once a week and will continue medication.

## 2023-04-12 NOTE — Assessment & Plan Note (Addendum)
 Repeat blood pressure reading is again elevated.  She is followed by cardiology.  She is currently on metoprolol and amlodipine.  She denies adverse effects.  She will discuss intensification of therapy with managing team.  We discussed goals of care.  Losing 10% of body weight will improve blood pressure control.  Continue current weight management strategy

## 2023-04-12 NOTE — Assessment & Plan Note (Signed)
 Hemoglobin A1c of 6.2.  Now on Zepbound 2.5 mg once a week for pharmacoprophylaxis.  She will continue medication.  Continue current weight management strategy

## 2023-04-12 NOTE — Progress Notes (Signed)
 Office: [phone]  /  Fax: [phone]  WEIGHT SUMMARY AND BIOMETRICS  Vitals Temp: 98.1 F (36.7 C) BP: (!) 144/75 Pulse Rate: 76 SpO2: 99 %   Anthropometric Measurements Height: 5\' 6"  (1.676 m) Weight: 257 lb (116.6 kg) BMI (Calculated): 41.5 Weight at Last Visit: 261 lb Weight Lost Since Last Visit: 4 lb Weight Gained Since Last Visit: 0 Starting Weight: 263 lb Total Weight Loss (lbs): 6 lb (2.722 kg) Peak Weight: 264 lb   Body Composition  Body Fat %: 44.9 % Fat Mass (lbs): 115.6 lbs Muscle Mass (lbs): 134.8 lbs Total Body Water (lbs): 90 lbs Visceral Fat Rating : 15    No data recorded Today's Visit #: 7  Starting Date: 10/11/22   HPI  Chief Complaint: OBESITY  Heather Dudley is here to discuss her progress with her obesity treatment plan. She is on the keeping a food journal and adhering to recommended goals of 1200 calories and   protein and states she is following her eating plan approximately 25 % of the time. She states she is exercising 30 minutes 3 times per week by walking and stretching.  Interval History:  Since last office visit she has lost 4 lbs. She reports good adherence to reduced calorie nutritional plan. She has been working on reading food labels, not skipping meals, increasing protein intake at every meal, drinking more water, making healthier choices, reducing portion sizes, and incorporating more whole foods Denies problems with appetite and hunger signals.  Denies problems with satiety and satiation.  Denies problems with eating patterns and portion control.  Denies abnormal cravings. Denies feeling deprived or restricted.   Barriers identified: medical comorbidities and presence of obesogenic drugs.   Pharmacotherapy for weight loss: She is currently taking Zepbound with adequate clinical response  and without side effects..    ASSESSMENT AND PLAN  TREATMENT PLAN FOR OBESITY:  Recommended Dietary Goals  Heather Dudley is  currently in the action stage of change. As such, her goal is to continue weight management plan. She has agreed to: continue current plan  Behavioral Intervention  We discussed the following Behavioral Modification Strategies today: continue to work on maintaining a reduced calorie state, getting the recommended amount of protein, incorporating whole foods, making healthy choices, staying well hydrated and practicing mindfulness when eating..  Additional resources provided today: None  Recommended Physical Activity Goals  Heather Dudley has been advised to work up to 150 minutes of moderate intensity aerobic activity a week and strengthening exercises 2-3 times per week for cardiovascular health, weight loss maintenance and preservation of muscle mass.   She has agreed to :  Think about enjoyable ways to increase daily physical activity and overcoming barriers to exercise and Increase physical activity in their day and reduce sedentary time (increase NEAT).  Pharmacotherapy We discussed various medication options to help Heather Dudley with her weight loss efforts and we both agreed to : adequate clinical response to current dose, continue current regimen and she is having an amplified response do not recommend increasing at present time  ASSOCIATED CONDITIONS ADDRESSED TODAY  Essential hypertension Assessment & Plan: Blood pressure reading is again elevated.  She is followed by cardiology.  She is currently on metoprolol and amlodipine.  She denies adverse effects.  She will discuss intensification of therapy with managing team.  We discussed goals of care.  Losing 10% of body weight will improve blood pressure control.  Continue current weight management strategy   OSA on CPAP Assessment & Plan: On CPAP  with reported good compliance. Continue PAP therapy. Losing 15% or more of body weight may improve AHI.  She is now on Zepbound 2.5 mg once a week and will continue medication.  Orders: -      Zepbound; Inject 2.5 mg into the skin once a week.  Dispense: 2 mL; Refill: 0  Class 3 severe obesity with serious comorbidity and body mass index (BMI) of 40.0 to 44.9 in adult, unspecified obesity type [facility]) Assessment & Plan: - Refer to obesity management plan - Continue with current weight management strategy. - Discussed adjustments to overcome identified barriers. - Reinforce dietary and exercise goals. -Continue Zepbound 2.5 mg once a week at current dose she is having an amplified response do not recommend further increases as it may result in restrictive eating.   Orders: -     Zepbound; Inject 2.5 mg into the skin once a week.  Dispense: 2 mL; Refill: 0 -     Multi-Vitamin/Minerals; Take 1 tablet by mouth daily.  Metabolic dysfunction-associated steatotic liver disease (MASLD) -     Zepbound; Inject 2.5 mg into the skin once a week.  Dispense: 2 mL; Refill: 0  Prediabetes Assessment & Plan: Hemoglobin A1c of 6.2.  Now on Zepbound 2.5 mg once a week for pharmacoprophylaxis.  She will continue medication.  Continue current weight management strategy  Orders: -     Zepbound; Inject 2.5 mg into the skin once a week.  Dispense: 2 mL; Refill: 0    PHYSICAL EXAM:  Blood pressure (!) 144/75, pulse 76, temperature 98.1 F (36.7 C), height 5\' 6"  (1.676 m), weight 257 lb (116.6 kg), last menstrual period 04/01/2008, SpO2 99%. Body mass index is 41.48 kg/m.  General: She is overweight, cooperative, alert, well developed, and in no acute distress. PSYCH: Has normal mood, affect and thought process.   HEENT: EOMI, sclerae are anicteric. Lungs: Normal breathing effort, no conversational dyspnea. Extremities: No edema.  Neurologic: No gross sensory or motor deficits. No tremors or fasciculations noted.    DIAGNOSTIC DATA REVIEWED:  BMET    Component Value Date/Time   NA 141 02/01/2023 1154   NA 137 02/22/2012 1443   K 4.3 02/01/2023 1154   K 3.6 09/19/2012 1011   CL 103  02/01/2023 1154   CL 101 02/22/2012 1443   CO2 24 02/01/2023 1154   CO2 27 02/22/2012 1443   GLUCOSE 93 02/01/2023 1154   GLUCOSE 106 (H) 01/11/2023 0836   GLUCOSE 102 (H) 02/22/2012 1443   BUN 12 02/01/2023 1154   BUN 13 02/22/2012 1443   CREATININE 0.93 02/01/2023 1154   CREATININE 1.09 11/02/2013 0903   CALCIUM 9.1 02/01/2023 1154   CALCIUM 9.0 02/22/2012 1443   GFRNONAA >60 01/11/2023 0836   GFRNONAA 59 (L) 11/02/2013 0903   GFRAA 68 07/11/2019 1143   GFRAA >60 11/02/2013 0903   Lab Results  Component Value Date   HGBA1C 6.0 (H) 02/01/2023   HGBA1C 5.8 (H) 07/11/2019   Lab Results  Component Value Date   INSULIN 19.6 10/21/2022   Lab Results  Component Value Date   TSH 1.140 12/26/2021   CBC    Component Value Date/Time   WBC 8.3 01/11/2023 0836   RBC 4.58 01/11/2023 0836   HGB 12.5 01/11/2023 0836   HGB 12.8 09/30/2022 1041   HCT 40.0 01/11/2023 0836   HCT 39.4 09/30/2022 1041   PLT 264 01/11/2023 0836   PLT 293 09/30/2022 1041   MCV 87.3 01/11/2023 0836  MCV 84 09/30/2022 1041   MCV 87 11/02/2013 0903   MCH 27.3 01/11/2023 0836   MCHC 31.3 01/11/2023 0836   RDW 13.8 01/11/2023 0836   RDW 13.5 09/30/2022 1041   RDW 13.9 11/02/2013 0903   Iron Studies No results found for: "IRON", "TIBC", "FERRITIN", "IRONPCTSAT" Lipid Panel     Component Value Date/Time   CHOL 233 (H) 02/01/2023 1154   TRIG 101 02/01/2023 1154   HDL 50 02/01/2023 1154   CHOLHDL 4.7 (H) 02/01/2023 1154   LDLCALC 165 (H) 02/01/2023 1154   Hepatic Function Panel     Component Value Date/Time   PROT 6.6 02/01/2023 1154   PROT 7.6 11/02/2013 0903   ALBUMIN 4.1 02/01/2023 1154   ALBUMIN 3.5 11/02/2013 0903   AST 13 02/01/2023 1154   AST 18 11/02/2013 0903   ALT 14 02/01/2023 1154   ALT 20 11/02/2013 0903   ALKPHOS 98 02/01/2023 1154   ALKPHOS 97 11/02/2013 0903   BILITOT 0.3 02/01/2023 1154   BILITOT 0.4 11/02/2013 0903   BILIDIR <0.1 (L) 11/06/2015 1528   IBILI NOT  CALCULATED 11/06/2015 1528      Component Value Date/Time   TSH 1.140 12/26/2021 1303   TSH 2.430 07/15/2021 1039   Nutritional Lab Results  Component Value Date   VD25OH 13.2 (L) 10/21/2022   VD25OH 20.0 (L) 07/01/2016   VD25OH 30.5 08/21/2015     Return in about 5 weeks (around 05/17/2023) for For Weight Mangement with Dr. Rikki Spearing.[name] She was informed of the importance of frequent follow up visits to maximize her success with intensive lifestyle modifications for her multiple health conditions.   ATTESTASTION STATEMENTS:  Reviewed by clinician on day of visit: allergies, medications, problem list, medical history, surgical history, family history, social history, and previous encounter notes.     Worthy Rancher, MD

## 2023-04-12 NOTE — Progress Notes (Unsigned)
 Cardiology Clinic Note    Date:  04/13/2023  [name] September 01, 1960, MRN [id number] PCP:  Reubin Milan, MD  Cardiologist:  Julien Nordmann, MD Electrophysiologist: Lanier Prude, MD    Patient Profile    Chief Complaint: AF ablation follow-up  History of Present Illness: Heather Dudley is a 63 y.o. female with PMH notable for persis AFib, typical aflutter, OSA, HTN; seen today for Lanier Prude, MD for routine electrophysiology followup after recent AF ablation.  She is s/p AFib ablation 12/2021. She is s/p repeat ablation 12/2022 w redo PVI and redo ablation/isolation of posterior wall. I saw her for 1 mon post-procedure and she had one AF episode, but overall maintaining sinus rhythm.   On follow-up today, she has had significant improvement in AF burden since ablation. She had one episodes of irregular HR that her watch notified her of. It lasted about 10 minutes. She continues to try to use her CPAP nightly, using nasal pillow currently. She has tried multiple different face masks. Her nose is very stuffy and she feels congested, the nasal pillow moves throughout the night. She has follow-up with her ENT in the next few months for further mgmt. Her BP by home readings is always 140 systolic. She has recently joined weight mgmt program and started zepbound. She is tolerating zepbound well.     Continues to take eliquis BID, no bleeding concerns.    AAD History: Propafenone - stopped 12/2022 post Ablation    ROS:  Please see the history of present illness. All other systems are reviewed and otherwise negative.    Physical Exam    VS:  BP (!) 140/60 (BP Location: Right Arm, Patient Position: Sitting, Cuff Size: Large)   Pulse 75   Ht 5\' 7"  (1.702 m)   Wt 263 lb 2 oz (119.4 kg)   LMP 04/01/2008   SpO2 98%   BMI 41.21 kg/m  BMI: Body mass index is 41.21 kg/m.  Vitals:   04/13/23 1109  BP: (!) 140/60  Pulse: 75  Height:  5\' 7"  (1.702 m)  Weight: 263 lb 2 oz (119.4 kg)  SpO2: 98%  BMI (Calculated): 41.2      Wt Readings from Last 3 Encounters:  04/13/23 263 lb 2 oz (119.4 kg)  04/12/23 257 lb (116.6 kg)  03/01/23 261 lb (118.4 kg)     GEN- The patient is well appearing, alert and oriented x 3 today.   Lungs- Clear to ausculation bilaterally, normal work of breathing.  Heart- Regular rate and rhythm, no murmurs, rubs or gallops Extremities- No peripheral edema, warm, dry    Studies Reviewed   Previous EP, cardiology notes.    EKG is ordered. Personal review of EKG from today shows:    EKG Interpretation Date/Time:  Tuesday April 13 2023 11:22:03 EST Ventricular Rate:  75 PR Interval:  182 QRS Duration:  94 QT Interval:  382 QTC Calculation: 426 R Axis:   -33  Text Interpretation: Normal sinus rhythm Left axis deviation Confirmed by Sherie Don [phone]) on 04/13/2023 11:24:42 AM    Long ter monitor, 06/23/2022 HR 60 - 184 bpm, average 85 bpm. 233 nonsustained SVT, longest 10 beats. 19% burden of AFL/AF, average rate 117 bpm. Frequent supraventricular ectopy, 9%. Rare ventricular ectopy.  TTE, 12/07/2021  1. Left ventricular ejection fraction, by estimation, is 55 to 60%. The left ventricle has normal function. The left ventricle has no regional wall motion abnormalities.  Left ventricular diastolic parameters were normal.   2. Right ventricular systolic function is normal. The right ventricular size is normal. There is normal pulmonary artery systolic pressure.   3. Left atrial size was mildly dilated.   4. The mitral valve is normal in structure. Trivial mitral valve regurgitation. No evidence of mitral stenosis.   5. The aortic valve is tricuspid. Aortic valve regurgitation is not visualized. No aortic stenosis is present.   6. The inferior vena cava is normal in size with greater than 50% respiratory variability, suggesting right atrial pressure of 3 mmHg.   Comparison(s): No  significant change from prior study.    Assessment and Plan     #) persis AFib S/p repeat ablation 12/2022 Had one palpitation episode, but overall maintaining sinus rhythm since ablation Wears watch to monitor for arrhythmia. Recommended keeping symptom diary Continue 75mg  toprol BID at this time, can consider switching to dilt in future  #) Hypercoag d/t persis afib CHA2DS2-VASc Score = at least 3 [CHF History: 0, HTN History: 1, Diabetes History: 0, Stroke History: 0, Vascular Disease History: 1, Age Score: 0, Gender Score: 1].  Therefore, the patient's annual risk of stroke is 3.2 %.    Stroke ppx - 5mg  eliquis BID, appropriately dosed No bleeding concerns  #) HTN Remains elevated on office readings and by home log Will increase amlodipine to 10mg  daily Continue toprol as above Continue 20mg  lasix daily Continue lifestyle modifications including regular physical activity, low sodium diet  #) OSA on CPAP Patient reports discomfort with CPAP use, including nasal swelling and dryness. Patient is considering Inspira implant. -Encourage patient to discuss CPAP issues and potential Inspira implant with sleep specialist.         Current medicines are reviewed at length with the patient today.   The patient has concerns regarding her medicines.  The following changes were made today:  none  Labs/ tests ordered today include:  Orders Placed This Encounter  Procedures   EKG 12-Lead     Disposition: Follow up with Dr. Lalla Brothers or EP APP in 6 months, sooner if needed   Signed, Sherie Don, NP  04/13/23  12:22 PM  Electrophysiology [facility]

## 2023-04-13 ENCOUNTER — Encounter: Payer: Self-pay | Admitting: Cardiology

## 2023-04-13 ENCOUNTER — Ambulatory Visit: Payer: 59 | Attending: Cardiology | Admitting: Cardiology

## 2023-04-13 ENCOUNTER — Other Ambulatory Visit: Payer: Self-pay | Admitting: Cardiology

## 2023-04-13 VITALS — BP 140/60 | HR 75 | Ht 67.0 in | Wt 263.1 lb

## 2023-04-13 DIAGNOSIS — I10 Essential (primary) hypertension: Secondary | ICD-10-CM

## 2023-04-13 DIAGNOSIS — G4733 Obstructive sleep apnea (adult) (pediatric): Secondary | ICD-10-CM | POA: Diagnosis not present

## 2023-04-13 DIAGNOSIS — I4819 Other persistent atrial fibrillation: Secondary | ICD-10-CM

## 2023-04-13 DIAGNOSIS — D6869 Other thrombophilia: Secondary | ICD-10-CM

## 2023-04-13 MED ORDER — EZETIMIBE 10 MG PO TABS: 10.0000 mg | ORAL_TABLET | Freq: Every day | ORAL | 3 refills | Status: DC

## 2023-04-13 MED ORDER — PANTOPRAZOLE SODIUM 40 MG PO TBEC: 40.0000 mg | DELAYED_RELEASE_TABLET | Freq: Every day | ORAL | 3 refills | Status: DC

## 2023-04-13 MED ORDER — AMLODIPINE BESYLATE 10 MG PO TABS: 10.0000 mg | ORAL_TABLET | ORAL | 3 refills | Status: DC | PRN

## 2023-04-13 NOTE — Patient Instructions (Signed)
 Medication Instructions:  Increase Amlodipine 10 mg daily   *If you need a refill on your cardiac medications before your next appointment, please call your pharmacy*   Follow-Up: At [facility], you and your health needs are our priority.  As part of our continuing mission to provide you with exceptional heart care, we have created designated Provider Care Teams.  These Care Teams include your primary Cardiologist (physician) and Advanced Practice Providers (APPs -  Physician Assistants and Nurse Practitioners) who all work together to provide you with the care you need, when you need it.  We recommend signing up for the patient portal called "MyChart".  Sign up information is provided on this After Visit Summary.  MyChart is used to connect with patients for Virtual Visits (Telemedicine).  Patients are able to view lab/test results, encounter notes, upcoming appointments, etc.  Non-urgent messages can be sent to your provider as well.   To learn more about what you can do with MyChart, go to [email or web address].    Your next appointment:   6 month(s)  Provider:   Steffanie Dunn, MD or Sherie Don, NP

## 2023-04-13 NOTE — Telephone Encounter (Signed)
 Please see note below.

## 2023-04-16 ENCOUNTER — Other Ambulatory Visit: Payer: Self-pay | Admitting: Internal Medicine

## 2023-04-16 NOTE — Telephone Encounter (Signed)
 Requested medication (s) are due for refill today: Yes  Requested medication (s) are on the active medication list: Yes  Last refill:  10/07/22, #90, 1 refill  Future visit scheduled: Yes  Notes to clinic:  Unable to refill per protocol due to failed labs, no updated results.      Requested Prescriptions  Pending Prescriptions Disp Refills   furosemide (LASIX) 20 MG tablet [Pharmacy Med Name: FUROSEMIDE 20 MG TABLET] 90 tablet 1    Sig: TAKE 1 TABLET BY MOUTH EVERY DAY     Cardiovascular:  Diuretics - Loop Failed - 04/16/2023  3:27 PM      Failed - Mg Level in normal range and within 180 days    Magnesium  Date Value Ref Range Status  12/06/2021 1.9 1.7 - 2.4 mg/dL Final    Comment:    Performed at [facility], [address] [place], [place] [address]  09/19/2012 2.1 mg/dL Final    Comment:    6.2-1.3 THERAPEUTIC RANGE: 4-7 mg/dL TOXIC: > 10 mg/dL  ----------------------- LABS - This specimen was collected through an   - indwelling catheter or arterial line.  - A minimum of of blood was wasted prior    - to collecting the sample.  Interpret  - results with caution.          Failed - Last BP in normal range    BP Readings from Last 1 Encounters:  04/13/23 (!) 140/60         Passed - K in normal range and within 180 days    Potassium  Date Value Ref Range Status  02/01/2023 4.3 3.5 - 5.2 mmol/L Final  09/19/2012 3.6 3.5 - 5.1 mmol/L Final         Passed - Ca in normal range and within 180 days    Calcium  Date Value Ref Range Status  02/01/2023 9.1 8.7 - 10.3 mg/dL Final   Calcium, Total  Date Value Ref Range Status  02/22/2012 9.0 8.5 - 10.1 mg/dL Final         Passed - Na in normal range and within 180 days    Sodium  Date Value Ref Range Status  02/01/2023 141 134 - 144 mmol/L Final  02/22/2012 137 136 - 145 mmol/L Final         Passed - Cr in normal range and within 180 days    Creatinine  Date Value Ref Range Status  11/02/2013  1.09 0.60 - 1.30 mg/dL Final   Creatinine, Ser  Date Value Ref Range Status  02/01/2023 0.93 0.57 - 1.00 mg/dL Final         Passed - Cl in normal range and within 180 days    Chloride  Date Value Ref Range Status  02/01/2023 103 96 - 106 mmol/L Final  02/22/2012 101 98 - 107 mmol/L Final         Passed - Valid encounter within last 6 months    Recent Outpatient Visits           2 months ago Mixed hyperlipidemia   [place] at [facility], Nyoka Cowden, MD   6 months ago Annual physical exam   [facility] at [facility], Nyoka Cowden, MD   10 months ago Chronic right-sided low back pain without sciatica   [facility] at [facility], Nyoka Cowden, MD  11 months ago Mixed hyperlipidemia   [place] [facility] at [facility], Nyoka Cowden, MD   1 year ago Essential hypertension   [facility], Nyoka Cowden, MD       Future Appointments             In 5 months Judithann Graves, Nyoka Cowden, MD [facility] at [facility]

## 2023-04-27 ENCOUNTER — Other Ambulatory Visit: Payer: Self-pay | Admitting: *Deleted

## 2023-04-27 ENCOUNTER — Telehealth: Payer: Self-pay | Admitting: Cardiology

## 2023-04-27 DIAGNOSIS — I480 Paroxysmal atrial fibrillation: Secondary | ICD-10-CM

## 2023-04-27 MED ORDER — APIXABAN 5 MG PO TABS: 5.0000 mg | ORAL_TABLET | Freq: Two times a day (BID) | ORAL | 1 refills | Status: DC

## 2023-04-27 NOTE — Telephone Encounter (Signed)
 Eliquis 5mg  refill request received. Patient is 63 years old, weight-119.4kg, Crea-0.93 on 02/01/23, Diagnosis-Afib, and last seen by Sherie Don on 04/13/23. Dose is appropriate based on dosing criteria. Will send in refill to requested pharmacy.

## 2023-04-27 NOTE — Telephone Encounter (Signed)
*  STAT* If patient is at the pharmacy, call can be transferred to refill team.   1. Which medications need to be refilled? (please list name of each medication and dose if known)   apixaban (ELIQUIS) 5 MG TABS tablet      4. Which pharmacy/location (including street and city if local pharmacy) is medication to be sent to?[id number] - [place], [place] - [address]    5. Do they need a 30 day or 90 day supply? 90

## 2023-04-30 ENCOUNTER — Telehealth: Payer: Self-pay | Admitting: Pharmacy Technician

## 2023-04-30 ENCOUNTER — Other Ambulatory Visit (HOSPITAL_COMMUNITY): Payer: Self-pay

## 2023-04-30 NOTE — Telephone Encounter (Signed)
 PA request has been Submitted. New Encounter has been or will be created for follow up. For additional info see Pharmacy Prior Auth telephone encounter from 04/30/23.

## 2023-04-30 NOTE — Telephone Encounter (Signed)
 Pharmacy Patient Advocate Encounter   Received notification from  rx requests  that prior authorization for ezetimibe is required/requested.   Insurance verification completed.   The patient is insured through [employer] [facility] .   Per test claim: PA required; PA submitted to above mentioned insurance via CoverMyMeds Key/confirmation #/EOC [id number] Status is pending

## 2023-05-03 NOTE — Telephone Encounter (Signed)
 Pharmacy Patient Advocate Encounter  Received notification from [facility] that Prior Authorization for ezetimibe has been APPROVED from 04/30/23 to 04/29/24. Spoke to pharmacy to process.Copay is $6.17.    PA #/Case ID/Reference #: [id number]

## 2023-05-17 ENCOUNTER — Ambulatory Visit (INDEPENDENT_AMBULATORY_CARE_PROVIDER_SITE_OTHER): Payer: BC Managed Care – PPO | Admitting: Internal Medicine

## 2023-05-17 ENCOUNTER — Encounter (INDEPENDENT_AMBULATORY_CARE_PROVIDER_SITE_OTHER): Payer: Self-pay | Admitting: Internal Medicine

## 2023-05-17 DIAGNOSIS — R7303 Prediabetes: Secondary | ICD-10-CM | POA: Diagnosis not present

## 2023-05-17 DIAGNOSIS — E559 Vitamin D deficiency, unspecified: Secondary | ICD-10-CM | POA: Diagnosis not present

## 2023-05-17 DIAGNOSIS — K760 Fatty (change of) liver, not elsewhere classified: Secondary | ICD-10-CM

## 2023-05-17 DIAGNOSIS — Z6841 Body Mass Index (BMI) 40.0 and over, adult: Secondary | ICD-10-CM

## 2023-05-17 DIAGNOSIS — E66813 Obesity, class 3: Secondary | ICD-10-CM

## 2023-05-17 DIAGNOSIS — G4733 Obstructive sleep apnea (adult) (pediatric): Secondary | ICD-10-CM

## 2023-05-17 MED ORDER — TIRZEPATIDE-WEIGHT MANAGEMENT 5 MG/0.5ML ~~LOC~~ SOAJ: 5.0000 mg | SUBCUTANEOUS | 0 refills | Status: DC

## 2023-05-17 NOTE — Progress Notes (Signed)
 Office: [phone]  /  Fax: [phone]  Weight Summary And Biometrics  Vitals Temp: 98.3 F (36.8 C) BP: 138/83 Pulse Rate: 78 SpO2: 99 %   Anthropometric Measurements Height: 5\' 6"  (1.676 m) Weight: 254 lb (115.2 kg) BMI (Calculated): 41.02 Weight at Last Visit: 257 lb Weight Lost Since Last Visit: 3 lb Weight Gained Since Last Visit: 0 Starting Weight: 263 lb Total Weight Loss (lbs): 9 lb (4.082 kg) Peak Weight: 264 lb   Body Composition  Body Fat %: 43.9 % Fat Mass (lbs): 111.8 lbs Muscle Mass (lbs): 135.6 lbs Total Body Water (lbs): 91.2 lbs Visceral Fat Rating : 15    No data recorded Today's Visit #: 8  Starting Date: 10/21/22   Subjective   Chief Complaint: Obesity  Interval History Discussed the use of AI scribe software for clinical note transcription with the patient, who gave verbal consent to proceed.  History of Present Illness Heather Dudley is a 63 year old female with hypertension, obstructive sleep apnea, and prediabetes who presents for medical weight management.  She has lost three pounds since her last visit and is following a reduced calorie nutrition plan, tracking about 1500 calories 75% of the time, and incorporating more whole foods. She occasionally skips meals and needs adequate hydration. She exercises one to two days a week, using the treadmill for 30 minutes, and walks about 5000 steps three times a week. She also does some stretching. She experiences high levels of stress due to caregiver responsibilities and work, and gets about five hours of sleep per night.  She is currently on Zepbound 2.5 mg once a week since January 6, which she feels has helped with appetite control and a sense of fullness. She experiences constipation and takes a green chlorophyll supplement once a week to manage it.  She is managing her prediabetes with a reduced calorie nutrition plan and by reducing simple and added sugars in her diet. Her  A1c was trending downward, indicating improvement.  She is on CPAP therapy for obstructive sleep apnea and tolerates the treatment well.  She is not currently taking medications for cholesterol but uses Zetia as it does not cause adverse effects like statins do. She had blood work done in December, which included cholesterol and A1c levels.  She does not drink alcohol and maintains a diet low in saturated fats and simple sugars to manage metabolic dysfunction associated with steatotic liver disease.    Challenges affecting patient progress: low volume of physical activity at present  and menopause.    Pharmacotherapy for weight management: She is currently taking Zepbound with adequate clinical response  and experiencing the following side effects: constipation..   Assessment and Plan   Treatment Plan For Obesity:  Recommended Dietary Goals  Lamica is currently in the action stage of change. As such, her goal is to continue weight management plan. She has agreed to: continue current plan  Behavioral Health and Counseling  We discussed the following behavioral modification strategies today: continue to work on maintaining a reduced calorie state, getting the recommended amount of protein, incorporating whole foods, making healthy choices, staying well hydrated and practicing mindfulness when eating..  Additional education and resources provided today: Handout on principles of weight management  Recommended Physical Activity Goals  [name] has been advised to work up to 150 minutes of moderate intensity aerobic activity a week and strengthening exercises 2-3 times per week for cardiovascular health, weight loss maintenance and preservation of muscle mass.  She has agreed to :  Think about enjoyable ways to increase daily physical activity and overcoming barriers to exercise and Increase physical activity in their day and reduce sedentary time (increase  NEAT).  Pharmacotherapy  We discussed various medication options to help [name] with her weight loss efforts and we both agreed to : increase zepbound to 5 mg once week.  Associated Conditions Impacted by Obesity Treatment  OSA on CPAP  Class 3 severe obesity with serious comorbidity and body mass index (BMI) of 40.0 to 44.9 in adult, unspecified obesity type (HCC) -     Tirzepatide-Weight Management; Inject 5 mg into the skin once a week.  Dispense: 2 mL; Refill: 0  Metabolic dysfunction-associated steatotic liver disease (MASLD)  Prediabetes  Vitamin D deficiency    Assessment and Plan Assessment & Plan Obesity She is undergoing medical weight management and has lost three pounds since the last visit. She is on Zepbound 2.5 mg once a week since January 6, which has helped with appetite control and portion sizes. She experiences constipation, managed with a green chlorophyll supplement once a week. Increasing Zepbound to 5 mg, the maintenance dose, is planned to enhance weight loss. Emphasis on adequate hydration, increased intake of fruits and vegetables, and avoiding meal skipping to prevent constipation and maintain metabolism. Encouraged to track protein intake to support weight loss and muscle mass maintenance. - Increase Zepbound to 5 mg once a week. - Ensure adequate hydration and increase intake of fruits and vegetables. - Avoid skipping meals; consider protein shakes and salads as meal replacements. - Start a multivitamin with minerals. - Schedule fasting labs to monitor cholesterol, A1c, and vitamin D levels. - Use Miralax or Dulcolax as needed for constipation. - Continue exercise regimen and consider increasing physical activity.  Prediabetes She is on a reduced calorie nutrition plan and Zepbound for pharmacoprophylaxis. A1c was trending downward, indicating improvement. Continued weight loss and dietary changes are expected to further improve glycemic control. -  Monitor A1c levels with scheduled fasting labs.  Metabolic Dysfunction-Associated Steatotic Liver Disease (MASLD) She does not consume alcohol and is maintaining a diet low in saturated fats and simple sugars. Weight loss is expected to improve liver condition. Losing 15% of body weight may improve the condition. - Monitor liver function with disease monitoring labs in six months.   Obstructive Sleep Apnea She is on CPAP therapy and tolerating it well. Weight loss may reduce the apnea-hypopnea index (AHI). Losing 15% of body weight may significantly improve AHI.  General Health Maintenance Advised to maintain a balanced diet, adequate hydration, and regular physical activity. Emphasis on tracking protein intake and avoiding meal skipping to support weight loss and metabolic health. - Review and follow the ten principles of weight management.  Follow-up Follow-up plans include monitoring weight management progress and adjusting treatment as necessary. - Schedule follow-up appointment in four weeks. - Ensure fasting for labs at the next visit.      Objective   Physical Exam:  Blood pressure 138/83, pulse 78, temperature 98.3 F (36.8 C), height 5\' 6"  (1.676 m), weight 254 lb (115.2 kg), last menstrual period 04/01/2008, SpO2 99%. Body mass index is 41 kg/m.  General: She is overweight, cooperative, alert, well developed, and in no acute distress. PSYCH: Has normal mood, affect and thought process.   HEENT: EOMI, sclerae are anicteric. Lungs: Normal breathing effort, no conversational dyspnea. Extremities: No edema.  Neurologic: No gross sensory or motor deficits. No tremors or fasciculations noted.  Diagnostic Data Reviewed:  BMET    Component Value Date/Time   NA 141 02/01/2023 1154   NA 137 02/22/2012 1443   K 4.3 02/01/2023 1154   K 3.6 09/19/2012 1011   CL 103 02/01/2023 1154   CL 101 02/22/2012 1443   CO2 24 02/01/2023 1154   CO2 27 02/22/2012 1443   GLUCOSE 93  02/01/2023 1154   GLUCOSE 106 (H) 01/11/2023 0836   GLUCOSE 102 (H) 02/22/2012 1443   BUN 12 02/01/2023 1154   BUN 13 02/22/2012 1443   CREATININE 0.93 02/01/2023 1154   CREATININE 1.09 11/02/2013 0903   CALCIUM 9.1 02/01/2023 1154   CALCIUM 9.0 02/22/2012 1443   GFRNONAA >60 01/11/2023 0836   GFRNONAA 59 (L) 11/02/2013 0903   GFRAA 68 07/11/2019 1143   GFRAA >60 11/02/2013 0903   Lab Results  Component Value Date   HGBA1C 6.0 (H) 02/01/2023   HGBA1C 5.8 (H) 07/11/2019   Lab Results  Component Value Date   INSULIN 19.6 10/21/2022   Lab Results  Component Value Date   TSH 1.140 12/26/2021   CBC    Component Value Date/Time   WBC 8.3 01/11/2023 0836   RBC 4.58 01/11/2023 0836   HGB 12.5 01/11/2023 0836   HGB 12.8 09/30/2022 1041   HCT 40.0 01/11/2023 0836   HCT 39.4 09/30/2022 1041   PLT 264 01/11/2023 0836   PLT 293 09/30/2022 1041   MCV 87.3 01/11/2023 0836   MCV 84 09/30/2022 1041   MCV 87 11/02/2013 0903   MCH 27.3 01/11/2023 0836   MCHC 31.3 01/11/2023 0836   RDW 13.8 01/11/2023 0836   RDW 13.5 09/30/2022 1041   RDW 13.9 11/02/2013 0903   Iron Studies No results found for: "IRON", "TIBC", "FERRITIN", "IRONPCTSAT" Lipid Panel     Component Value Date/Time   CHOL 233 (H) 02/01/2023 1154   TRIG 101 02/01/2023 1154   HDL 50 02/01/2023 1154   CHOLHDL 4.7 (H) 02/01/2023 1154   LDLCALC 165 (H) 02/01/2023 1154   Hepatic Function Panel     Component Value Date/Time   PROT 6.6 02/01/2023 1154   PROT 7.6 11/02/2013 0903   ALBUMIN 4.1 02/01/2023 1154   ALBUMIN 3.5 11/02/2013 0903   AST 13 02/01/2023 1154   AST 18 11/02/2013 0903   ALT 14 02/01/2023 1154   ALT 20 11/02/2013 0903   ALKPHOS 98 02/01/2023 1154   ALKPHOS 97 11/02/2013 0903   BILITOT 0.3 02/01/2023 1154   BILITOT 0.4 11/02/2013 0903   BILIDIR <0.1 (L) 11/06/2015 1528   IBILI NOT CALCULATED 11/06/2015 1528      Component Value Date/Time   TSH 1.140 12/26/2021 1303   TSH 2.430 07/15/2021  1039   Nutritional Lab Results  Component Value Date   VD25OH 13.2 (L) 10/21/2022   VD25OH 20.0 (L) 07/01/2016   VD25OH 30.5 08/21/2015    Medications: Outpatient Encounter Medications as of 05/17/2023  Medication Sig Note   amLODipine (NORVASC) 10 MG tablet Take 1 tablet (10 mg total) by mouth as needed (For chest pain).    apixaban (ELIQUIS) 5 MG TABS tablet Take 1 tablet (5 mg total) by mouth 2 (two) times daily.    B Complex-Biotin-FA (B-COMPLEX PO) Take 1 tablet by mouth daily.    colchicine 0.6 MG tablet Take 1 tablet (0.6 mg total) by mouth 2 (two) times daily for 5 days.    ezetimibe (ZETIA) 10 MG tablet Take 1 tablet (10 mg total) by mouth daily.  famotidine (PEPCID) 40 MG tablet Take 1 tablet (40 mg total) by mouth daily.    furosemide (LASIX) 20 MG tablet TAKE 1 TABLET BY MOUTH EVERY DAY    levothyroxine (SYNTHROID) 75 MCG tablet Take 75 mcg by mouth daily.    LORazepam (ATIVAN) 0.5 MG tablet Take 1 tablet (0.5 mg total) by mouth every 8 (eight) hours as needed. for anxiety    metoprolol succinate (TOPROL-XL) 50 MG 24 hr tablet TAKE 1.5 TABLETS (75 MG TOTAL) BY MOUTH 2 (TWO) TIMES DAILY. TAKE WITH OR IMMEDIATELY FOLLOWING A MEAL.    Multiple Vitamins-Minerals (MULTIVITAMIN WITH MINERALS) tablet Take 1 tablet by mouth daily.    nitroGLYCERIN (NITROSTAT) 0.4 MG SL tablet Place 1 tablet (0.4 mg total) under the tongue every 5 (five) minutes as needed for chest pain.    potassium chloride SA (KLOR-CON M) 20 MEQ tablet Take 1 tablet (20 mEq total) by mouth daily. 04/13/2023: PRN   tirzepatide (ZEPBOUND) 5 MG/0.5ML Pen Inject 5 mg into the skin once a week.    Vitamin D, Ergocalciferol, (DRISDOL) 1.25 MG (50000 UNIT) CAPS capsule Take 1 capsule (50,000 Units total) by mouth every 7 (seven) days.    [DISCONTINUED] tirzepatide (ZEPBOUND) 2.5 MG/0.5ML Pen Inject 2.5 mg into the skin once a week.    No facility-administered encounter medications on file as of 05/17/2023.      Follow-Up   Return in about 4 weeks (around 06/14/2023) for Fasting for labs.. She was informed of the importance of frequent follow up visits to maximize her success with intensive lifestyle modifications for her multiple health conditions.  Attestation Statement   Reviewed by clinician on day of visit: allergies, medications, problem list, medical history, surgical history, family history, social history, and previous encounter notes.     Worthy Rancher, MD

## 2023-05-20 ENCOUNTER — Telehealth: Payer: Self-pay | Admitting: Cardiovascular Disease

## 2023-05-20 NOTE — Telephone Encounter (Signed)
*  STAT* If patient is at the pharmacy, call can be transferred to refill team.   1. Which medications need to be refilled? (please list name of each medication and dose if known)   metoprolol succinate (TOPROL-XL) 50 MG 24 hr tablet     2. Would you like to learn more about the convenience, safety, & potential cost savings by using the [facility]? No   3. Are you open to using the [facility] (Type [facility].) No   4. Which pharmacy/location (including street and city if local pharmacy) is medication to be sent to? [id number] - [place], [place] - [address]    5. Do they need a 30 day or 90 day supply? 90 day

## 2023-05-21 MED ORDER — METOPROLOL SUCCINATE ER 50 MG PO TB24: 75.0000 mg | ORAL_TABLET | Freq: Two times a day (BID) | ORAL | 3 refills | Status: DC

## 2023-05-21 NOTE — Telephone Encounter (Signed)
 [facility].

## 2023-05-28 ENCOUNTER — Other Ambulatory Visit: Payer: Self-pay

## 2023-05-28 MED ORDER — AMLODIPINE BESYLATE 10 MG PO TABS: 10.0000 mg | ORAL_TABLET | ORAL | 3 refills | Status: DC | PRN

## 2023-05-28 MED ORDER — METOPROLOL SUCCINATE ER 50 MG PO TB24: 75.0000 mg | ORAL_TABLET | Freq: Two times a day (BID) | ORAL | 3 refills | Status: DC

## 2023-06-15 ENCOUNTER — Encounter: Payer: Self-pay | Admitting: Internal Medicine

## 2023-06-15 ENCOUNTER — Ambulatory Visit (INDEPENDENT_AMBULATORY_CARE_PROVIDER_SITE_OTHER): Admitting: Internal Medicine

## 2023-06-15 VITALS — BP 118/76 | HR 78 | Resp 16 | Ht 66.0 in | Wt 258.2 lb

## 2023-06-15 DIAGNOSIS — F411 Generalized anxiety disorder: Secondary | ICD-10-CM

## 2023-06-15 DIAGNOSIS — M533 Sacrococcygeal disorders, not elsewhere classified: Secondary | ICD-10-CM

## 2023-06-15 MED ORDER — LORAZEPAM 0.5 MG PO TABS: 0.5000 mg | ORAL_TABLET | Freq: Three times a day (TID) | ORAL | 0 refills | Status: AC | PRN

## 2023-06-15 MED ORDER — METHOCARBAMOL 500 MG PO TABS: 500.0000 mg | ORAL_TABLET | Freq: Every day | ORAL | 0 refills | Status: DC

## 2023-06-15 MED ORDER — IBUPROFEN 800 MG PO TABS: 800.0000 mg | ORAL_TABLET | Freq: Three times a day (TID) | ORAL | 0 refills | Status: DC | PRN

## 2023-06-15 NOTE — Assessment & Plan Note (Signed)
 Will refill Lorazepam  for upcoming extended airline travel.

## 2023-06-15 NOTE — Progress Notes (Signed)
 Date:  06/15/2023   Name:  Heather Dudley   DOB:  Mar 02, 1960   MRN:  [id number]   Chief Complaint: Back Pain (Saw chiropractor Friday -Pain 3/10 Lumbar region x 1 month denies injury. Causing issues with falling asleep at night. )  Back Pain This is a recurrent problem. The current episode started more than 1 year ago. The problem occurs intermittently. The pain is present in the lumbar spine. The symptoms are aggravated by twisting and sitting. Pertinent negatives include no chest pain, fever, headaches or weakness. She has tried chiropractic manipulation for the symptoms. The treatment provided mild relief.    Review of Systems  Constitutional:  Negative for chills, fatigue and fever.  Respiratory:  Negative for chest tightness and shortness of breath.   Cardiovascular:  Negative for chest pain.  Genitourinary:  Negative for difficulty urinating.  Musculoskeletal:  Positive for back pain. Negative for myalgias.  Neurological:  Negative for dizziness, weakness, light-headedness and headaches.  Psychiatric/Behavioral:  Negative for dysphoric mood and sleep disturbance. The patient is not nervous/anxious.      Lab Results  Component Value Date   NA 141 02/01/2023   K 4.3 02/01/2023   CO2 24 02/01/2023   GLUCOSE 93 02/01/2023   BUN 12 02/01/2023   CREATININE 0.93 02/01/2023   CALCIUM  9.1 02/01/2023   EGFR 70 02/01/2023   GFRNONAA >60 01/11/2023   Lab Results  Component Value Date   CHOL 233 (H) 02/01/2023   HDL 50 02/01/2023   LDLCALC 165 (H) 02/01/2023   TRIG 101 02/01/2023   CHOLHDL 4.7 (H) 02/01/2023   Lab Results  Component Value Date   TSH 1.140 12/26/2021   Lab Results  Component Value Date   HGBA1C 6.0 (H) 02/01/2023   Lab Results  Component Value Date   WBC 8.3 01/11/2023   HGB 12.5 01/11/2023   HCT 40.0 01/11/2023   MCV 87.3 01/11/2023   PLT 264 01/11/2023   Lab Results  Component Value Date   ALT 14 02/01/2023   AST 13 02/01/2023   ALKPHOS  98 02/01/2023   BILITOT 0.3 02/01/2023   Lab Results  Component Value Date   VD25OH 13.2 (L) 10/21/2022     Patient Active Problem List   Diagnosis Date Noted   Statin intolerance 01/18/2023   Polyp of colon 12/10/2022   Class 3 severe obesity with serious comorbidity and body mass index (BMI) of 40.0 to 44.9 in adult (HCC) 10/21/2022   Metabolic dysfunction-associated steatotic liver disease (MASLD) 10/21/2022   Gastroesophageal reflux disease 09/30/2022   Generalized anxiety disorder 05/05/2022   Paroxysmal atrial fibrillation (HCC) 03/29/2022   Acquired thrombophilia (HCC) 03/18/2022   Chronic diastolic heart failure (HCC) 03/18/2022   Prediabetes 12/16/2021   CAD (coronary artery disease), native coronary artery 12/06/2021   Lymphedema of left arm 09/23/2021   Nevus of neck 09/23/2021   OSA on CPAP 07/12/2020   S/P total abdominal hysterectomy 04/03/2020   Multiple thyroid  nodules 08/23/2018   Mixed hyperlipidemia 07/06/2018   Microscopic hematuria 05/04/2018   Liver hemangioma 03/26/2017   Macroglossia 02/05/2016   Environmental and seasonal allergies 01/02/2016   Vitamin D  deficiency 04/19/2015   Cervical radiculopathy 04/17/2015   Essential hypertension 04/17/2015   History of partial thyroidectomy 04/17/2015   Lipoma of axilla 10/24/2014   History of left breast cancer 09/02/2010    Allergies  Allergen Reactions   Ace Inhibitors Swelling   Sernivo [Betamethasone Dipropionate] Itching   Penicillins Rash  Has patient had a PCN reaction causing immediate rash, facial/tongue/throat swelling, SOB or lightheadedness with hypotension: Yes Has patient had a PCN reaction causing severe rash involving mucus membranes or skin necrosis: No Has patient had a PCN reaction that required hospitalization: No Has patient had a PCN reaction occurring within the last 10 years: No If all of the above answers are "NO", then may proceed with Cephalosporin use.    Past Surgical  History:  Procedure Laterality Date   ABDOMINAL HYSTERECTOMY  2011   ovaries remain   ATRIAL FIBRILLATION ABLATION N/A 01/12/2022   Procedure: ATRIAL FIBRILLATION ABLATION;  Surgeon: Boyce Byes, MD;  Location: [facility];  Service: Cardiovascular;  Laterality: N/A;   ATRIAL FIBRILLATION ABLATION N/A 01/11/2023   Procedure: ATRIAL FIBRILLATION ABLATION;  Surgeon: Boyce Byes, MD;  Location: [facility];  Service: Cardiovascular;  Laterality: N/A;   birth mark removed     BREAST CYST ASPIRATION Right    negative 01/2010   BREAST EXCISIONAL BIOPSY  08/05/2010   left breast positive 07/2010   BREAST LUMPECTOMY  2012   left breast   COLONOSCOPY  07/29/2012   normal study.   COLONOSCOPY WITH PROPOFOL  N/A 12/10/2022   Procedure: COLONOSCOPY WITH PROPOFOL ;  Surgeon: Marnee Sink, MD;  Location: [facility] ENDOSCOPY;  Service: Endoscopy;  Laterality: N/A;   HEMOSTASIS CLIP PLACEMENT  12/10/2022   Procedure: HEMOSTASIS CLIP PLACEMENT;  Surgeon: Marnee Sink, MD;  Location: [facility] ENDOSCOPY;  Service: Endoscopy;;   HOT HEMOSTASIS  12/10/2022   Procedure: HOT HEMOSTASIS (ARGON PLASMA COAGULATION/BICAP);  Surgeon: Marnee Sink, MD;  Location: [facility] ENDOSCOPY;  Service: Endoscopy;;   LEFT HEART CATH AND CORONARY ANGIOGRAPHY N/A 12/08/2021   Procedure: LEFT HEART CATH AND CORONARY ANGIOGRAPHY;  Surgeon: Wenona Hamilton, MD;  Location: [facility] INVASIVE CV LAB;  Service: Cardiovascular;  Laterality: N/A;   POLYPECTOMY  12/10/2022   Procedure: POLYPECTOMY;  Surgeon: Marnee Sink, MD;  Location: [facility] ENDOSCOPY;  Service: Endoscopy;;   robotic tonsillectomy Lingual tonsils Bilateral 06/08/2016   THYROID  SURGERY     partially removed   TUBAL LIGATION      Social History   Tobacco Use   Smoking status: Never   Smokeless tobacco: Never   Tobacco comments:    Never smoke 02/09/22  Vaping Use   Vaping status: Never Used  Substance Use Topics   Alcohol use: Yes    Comment: rarely    Drug use: Never     Medication list has been reviewed and updated.  Current Meds  Medication Sig   amLODipine  (NORVASC ) 10 MG tablet Take 1 tablet (10 mg total) by mouth as needed (For chest pain).   apixaban  (ELIQUIS ) 5 MG TABS tablet Take 1 tablet (5 mg total) by mouth 2 (two) times daily.   B Complex-Biotin-FA (B-COMPLEX PO) Take 1 tablet by mouth daily.   ezetimibe  (ZETIA ) 10 MG tablet Take 1 tablet (10 mg total) by mouth daily.   famotidine  (PEPCID ) 40 MG tablet Take 1 tablet (40 mg total) by mouth daily.   furosemide  (LASIX ) 20 MG tablet TAKE 1 TABLET BY MOUTH EVERY DAY   ibuprofen  (ADVIL ) 800 MG tablet Take 1 tablet (800 mg total) by mouth every 8 (eight) hours as needed.   levothyroxine  (SYNTHROID ) 75 MCG tablet Take 75 mcg by mouth daily.   methocarbamol  (ROBAXIN ) 500 MG tablet Take 1 tablet (500 mg total) by mouth at bedtime.   metoprolol  succinate (TOPROL -XL) 50 MG 24 hr tablet Take 1.5  tablets (75 mg total) by mouth 2 (two) times daily. Take with or immediately following a meal.   Multiple Vitamins-Minerals (MULTIVITAMIN WITH MINERALS) tablet Take 1 tablet by mouth daily.   nitroGLYCERIN  (NITROSTAT ) 0.4 MG SL tablet Place 1 tablet (0.4 mg total) under the tongue every 5 (five) minutes as needed for chest pain.   potassium chloride  SA (KLOR-CON  M) 20 MEQ tablet Take 1 tablet (20 mEq total) by mouth daily.   tirzepatide  (ZEPBOUND ) 5 MG/0.5ML Pen Inject 5 mg into the skin once a week.   Vitamin D , Ergocalciferol , (DRISDOL ) 1.25 MG (50000 UNIT) CAPS capsule Take 1 capsule (50,000 Units total) by mouth every 7 (seven) days.   [DISCONTINUED] Ibuprofen  (ADVIL ) 200 MG CAPS Take by mouth.   [DISCONTINUED] LORazepam  (ATIVAN ) 0.5 MG tablet Take 1 tablet (0.5 mg total) by mouth every 8 (eight) hours as needed. for anxiety       06/15/2023    3:40 PM 02/01/2023   11:02 AM 09/30/2022    9:57 AM 06/18/2022    8:49 AM  GAD 7 : Generalized Anxiety Score  Nervous, Anxious, on Edge 0 0 0 0   Control/stop worrying 0 0 0 0  Worry too much - different things 1 0 0 0  Trouble relaxing 0 1 1 2   Restless 0 1 1 2   Easily annoyed or irritable 1 0 1 0  Afraid - awful might happen 0 0 0 0  Total GAD 7 Score 2 2 3 4   Anxiety Difficulty Not difficult at all Not difficult at all Not difficult at all Not difficult at all       06/15/2023    3:39 PM 02/01/2023   11:02 AM 09/30/2022    9:57 AM  Depression screen PHQ 2/9  Decreased Interest 1 0 0  Down, Depressed, Hopeless 0 0 0  PHQ - 2 Score 1 0 0  Altered sleeping 2 3 2   Tired, decreased energy 2 2 2   Change in appetite 0 1 1  Feeling bad or failure about yourself  0 0 0  Trouble concentrating 1 1 1   Moving slowly or fidgety/restless 0 1 1  Suicidal thoughts 0 0 0  PHQ-9 Score 6 8 7   Difficult doing work/chores Not difficult at all Not difficult at all Not difficult at all    BP Readings from Last 3 Encounters:  06/15/23 118/76  05/17/23 138/83  04/13/23 (!) 140/60    Physical Exam Vitals and nursing note reviewed.  Constitutional:      General: She is not in acute distress.    Appearance: She is well-developed.  HENT:     Head: Normocephalic and atraumatic.  Cardiovascular:     Rate and Rhythm: Normal rate and regular rhythm.  Pulmonary:     Effort: Pulmonary effort is normal. No respiratory distress.     Breath sounds: No wheezing or rhonchi.  Musculoskeletal:     Cervical back: Normal range of motion.     Lumbar back: Spasms (and discomfort over the right SI region) present. Negative right straight leg raise test and negative left straight leg raise test.     Right lower leg: No edema.     Left lower leg: No edema.  Skin:    General: Skin is warm and dry.     Findings: No rash.  Neurological:     Mental Status: She is alert and oriented to person, place, and time.     Sensory: Sensation is intact.  Motor: No weakness.     Deep Tendon Reflexes:     Reflex Scores:      Patellar reflexes are 1+ on the  right side and 1+ on the left side. Psychiatric:        Mood and Affect: Mood normal.        Behavior: Behavior normal.     Wt Readings from Last 3 Encounters:  06/15/23 258 lb 3.2 oz (117.1 kg)  05/17/23 254 lb (115.2 kg)  04/13/23 263 lb 2 oz (119.4 kg)    BP 118/76   Pulse 78   Resp 16   Ht 5\' 6"  (1.676 m)   Wt 258 lb 3.2 oz (117.1 kg)   LMP 04/01/2008   SpO2 99%   BMI 41.67 kg/m   Assessment and Plan:  Problem List Items Addressed This Visit       Unprioritized   Generalized anxiety disorder   Will refill Lorazepam  for upcoming extended airline travel.      Relevant Medications   LORazepam  (ATIVAN ) 0.5 MG tablet   Other Visit Diagnoses       Sacroiliac joint pain    -  Primary   Recommend intermittent heat Advil  800 mg tid x 7 days Robaxin  500 mg qhs x 7 days   Relevant Medications   ibuprofen  (ADVIL ) 800 MG tablet   methocarbamol  (ROBAXIN ) 500 MG tablet       No follow-ups on file.    Sheron Dixons, MD [facility] [place]

## 2023-06-21 ENCOUNTER — Telehealth (INDEPENDENT_AMBULATORY_CARE_PROVIDER_SITE_OTHER): Admitting: Internal Medicine

## 2023-06-21 ENCOUNTER — Encounter (INDEPENDENT_AMBULATORY_CARE_PROVIDER_SITE_OTHER): Payer: Self-pay | Admitting: Internal Medicine

## 2023-06-21 VITALS — BP 112/76 | Ht 66.0 in | Wt 248.0 lb

## 2023-06-21 DIAGNOSIS — Z6841 Body Mass Index (BMI) 40.0 and over, adult: Secondary | ICD-10-CM

## 2023-06-21 DIAGNOSIS — K5903 Drug induced constipation: Secondary | ICD-10-CM | POA: Insufficient documentation

## 2023-06-21 DIAGNOSIS — G4733 Obstructive sleep apnea (adult) (pediatric): Secondary | ICD-10-CM

## 2023-06-21 DIAGNOSIS — R7303 Prediabetes: Secondary | ICD-10-CM

## 2023-06-21 DIAGNOSIS — E7800 Pure hypercholesterolemia, unspecified: Secondary | ICD-10-CM

## 2023-06-21 DIAGNOSIS — I10 Essential (primary) hypertension: Secondary | ICD-10-CM | POA: Diagnosis not present

## 2023-06-21 DIAGNOSIS — K760 Fatty (change of) liver, not elsewhere classified: Secondary | ICD-10-CM | POA: Diagnosis not present

## 2023-06-21 DIAGNOSIS — T383X5A Adverse effect of insulin and oral hypoglycemic [antidiabetic] drugs, initial encounter: Secondary | ICD-10-CM

## 2023-06-21 DIAGNOSIS — E66813 Obesity, class 3: Secondary | ICD-10-CM

## 2023-06-21 MED ORDER — TIRZEPATIDE-WEIGHT MANAGEMENT 5 MG/0.5ML ~~LOC~~ SOAJ: 5.0000 mg | SUBCUTANEOUS | 1 refills | Status: DC

## 2023-06-21 NOTE — Assessment & Plan Note (Signed)
 Detected on ultrasound in 2013.  Most recent liver enzymes are within normal limits.   Fibrosis 4 Score = 1.11 (Low risk)        Interpretation for patients with NAFLD          <1.30       -  F0-F1 (Low risk)          1.30-2.67 -  Indeterminate           >2.67      -  F3-F4 (High risk)     Validated for ages 3-65   No further work-up. Losing 15% may improve conditions also avoiding alcohol and reducing simple and processed carbs.  Continue current weight management strategy inclusive of GLP-1.  Check CMP with next of labs.

## 2023-06-21 NOTE — Assessment & Plan Note (Signed)
 Do not recommend further increases in GLP-1 as may worsen her constipation.  Patient advised on increasing fiber as well as water consumption.  Continue to use MiraLAX  as needed

## 2023-06-21 NOTE — Progress Notes (Signed)
 Virtual Visit via Video Note  I connected withNAME@ on 06/21/23 at  8:40 AM EDT by a video enabled telemedicine application and verified that I am speaking with the correct person using two identifiers.  Location:  Patient: Home Provider: Office   I discussed the limitations of evaluation and management by telemedicine and the availability of in person appointments. The patient expressed understanding and agreed to proceed.    Weight Summary And Biometrics  Vitals BP: 112/76 (taken at home 06/18/2023)   Anthropometric Measurements Height: 5\' 6"  (1.676 m) Weight: 248 lb (112.5 kg) (reported by pt via telehealth) BMI (Calculated): 40.05 Weight at Last Visit: 254 lb Weight Lost Since Last Visit: 6 lb Starting Weight: 263 lb Peak Weight: 264 lb   No data recorded  No data recorded Today's Visit #: 9  Starting Date: 10/01/22   Subjective   Chief Complaint: Obesity  Heather Dudley is here to discuss her progress with her obesity treatment plan. She is on the the Category 3 Plan and states she is following her eating plan approximately 60% of the time. She states she is exercising 30 minutes minutes 3 times per week.  Weight Progress Since Last Visit:  Since last office visit she has lost 6 pounds. She reports fair adherence to reduced calorie nutritional plan. She has been working on reading food labels, not skipping meals, increasing protein intake at every meal, drinking more water, making healthier choices, reducing portion sizes, and incorporating more whole foods   Patient Reported Barriers to Progress: none.   Orexigenic Control: Reports improved problems with appetite and hunger signals.  Reports improved problems with satiety and satiation.  Denies problems with eating patterns and portion control.  Denies abnormal cravings. Denies feeling deprived or restricted.   Pharmacotherapy for weight management: She is currently taking Zepbound  with adequate clinical  response  and without side effects..   Assessment and Plan   Treatment Plan For Obesity:  Recommended Dietary Goals  [name] is currently in the action stage of change. As such, her goal is to continue weight management plan. She has agreed to: continue current plan  Behavioral Health and Counseling  We discussed the following behavioral modification strategies today: continue to work on maintaining a reduced calorie state, getting the recommended amount of protein, incorporating whole foods, making healthy choices, staying well hydrated and practicing mindfulness when eating..  Additional education and resources provided today: None  Recommended Physical Activity Goals  [name] has been advised to work up to 150 minutes of moderate intensity aerobic activity a week and strengthening exercises 2-3 times per week for cardiovascular health, weight loss maintenance and preservation of muscle mass.   She has agreed to :  continue to gradually increase the amount and intensity of exercise routine  Pharmacotherapy  We discussed various medication options to help [name] with her weight loss efforts and we both agreed to : adequate clinical response to anti-obesity medication, continue current regimen and do not recommend further increases in GLP-1 due to gastrointestinal side effects  Associated Conditions Impacted by Obesity Treatment  Essential hypertension Assessment & Plan: Blood pressure at home is 112/76 she is currently on amlodipine , metoprolol  without any adverse effects.  We are checking renal parameters and electrolytes.  Continue current weight management strategy   Class 3 severe obesity with serious comorbidity and body mass index (BMI) of 40.0 to 44.9 in adult, unspecified obesity type [facility]) Assessment & Plan: Patient has lost approximately 15 pounds since starting GLP-1 along with  lifestyle changes.  Continue current weight management strategy.  See obesity  treatment plan  Orders: -     Tirzepatide -Weight Management; Inject 5 mg into the skin once a week.  Dispense: 2 mL; Refill: 1  Metabolic dysfunction-associated steatotic liver disease (MASLD) Assessment & Plan: Detected on ultrasound in 2013.  Most recent liver enzymes are within normal limits.   Fibrosis 4 Score = 1.11 (Low risk)        Interpretation for patients with NAFLD          <1.30       -  F0-F1 (Low risk)          1.30-2.67 -  Indeterminate           >2.67      -  F3-F4 (High risk)     Validated for ages 56-65   No further work-up. Losing 15% may improve conditions also avoiding alcohol and reducing simple and processed carbs.  Continue current weight management strategy inclusive of GLP-1.  Check CMP with next of labs.   OSA on CPAP Assessment & Plan: On CPAP with reported good compliance. Continue PAP therapy. Losing 15% or more of body weight may improve AHI.  Continue current weight management strategy inclusive of GLP-1   Drug-induced constipation Assessment & Plan: Do not recommend further increases in GLP-1 as may worsen her constipation.  Patient advised on increasing fiber as well as water consumption.  Continue to use MiraLAX  as needed      Objective   Physical Exam:  Blood pressure 112/76, height 5\' 6"  (1.676 m), weight 248 lb (112.5 kg), last menstrual period 04/01/2008. Body mass index is 40.03 kg/m.  General: She is overweight, cooperative, alert, well developed, and in no acute distress. PSYCH: Has normal mood, affect and thought process.   Lungs: Normal breathing effort, no conversational dyspnea.    Diagnostic Data Reviewed:  BMET    Component Value Date/Time   NA 141 02/01/2023 1154   NA 137 02/22/2012 1443   K 4.3 02/01/2023 1154   K 3.6 09/19/2012 1011   CL 103 02/01/2023 1154   CL 101 02/22/2012 1443   CO2 24 02/01/2023 1154   CO2 27 02/22/2012 1443   GLUCOSE 93 02/01/2023 1154   GLUCOSE 106 (H) 01/11/2023 0836   GLUCOSE 102  (H) 02/22/2012 1443   BUN 12 02/01/2023 1154   BUN 13 02/22/2012 1443   CREATININE 0.93 02/01/2023 1154   CREATININE 1.09 11/02/2013 0903   CALCIUM  9.1 02/01/2023 1154   CALCIUM  9.0 02/22/2012 1443   GFRNONAA >60 01/11/2023 0836   GFRNONAA 59 (L) 11/02/2013 0903   GFRAA 68 07/11/2019 1143   GFRAA >60 11/02/2013 0903   Lab Results  Component Value Date   HGBA1C 6.0 (H) 02/01/2023   HGBA1C 5.8 (H) 07/11/2019   Lab Results  Component Value Date   INSULIN  19.6 10/21/2022   Lab Results  Component Value Date   TSH 1.140 12/26/2021   CBC    Component Value Date/Time   WBC 8.3 01/11/2023 0836   RBC 4.58 01/11/2023 0836   HGB 12.5 01/11/2023 0836   HGB 12.8 09/30/2022 1041   HCT 40.0 01/11/2023 0836   HCT 39.4 09/30/2022 1041   PLT 264 01/11/2023 0836   PLT 293 09/30/2022 1041   MCV 87.3 01/11/2023 0836   MCV 84 09/30/2022 1041   MCV 87 11/02/2013 0903   MCH 27.3 01/11/2023 0836   MCHC 31.3 01/11/2023 0836   RDW 13.8  01/11/2023 0836   RDW 13.5 09/30/2022 1041   RDW 13.9 11/02/2013 0903   Iron Studies No results found for: "IRON", "TIBC", "FERRITIN", "IRONPCTSAT" Lipid Panel     Component Value Date/Time   CHOL 233 (H) 02/01/2023 1154   TRIG 101 02/01/2023 1154   HDL 50 02/01/2023 1154   CHOLHDL 4.7 (H) 02/01/2023 1154   LDLCALC 165 (H) 02/01/2023 1154   Hepatic Function Panel     Component Value Date/Time   PROT 6.6 02/01/2023 1154   PROT 7.6 11/02/2013 0903   ALBUMIN 4.1 02/01/2023 1154   ALBUMIN 3.5 11/02/2013 0903   AST 13 02/01/2023 1154   AST 18 11/02/2013 0903   ALT 14 02/01/2023 1154   ALT 20 11/02/2013 0903   ALKPHOS 98 02/01/2023 1154   ALKPHOS 97 11/02/2013 0903   BILITOT 0.3 02/01/2023 1154   BILITOT 0.4 11/02/2013 0903   BILIDIR <0.1 (L) 11/06/2015 1528   IBILI NOT CALCULATED 11/06/2015 1528      Component Value Date/Time   TSH 1.140 12/26/2021 1303   TSH 2.430 07/15/2021 1039   Nutritional Lab Results  Component Value Date   VD25OH  13.2 (L) 10/21/2022   VD25OH 20.0 (L) 07/01/2016   VD25OH 30.5 08/21/2015    Follow-Up   No follow-ups on file.Aaron Aas She was informed of the importance of frequent follow up visits to maximize her success with intensive lifestyle modifications for her multiple health conditions.  Attestation Statement   Reviewed by clinician on day of visit: allergies, medications, problem list, medical history, surgical history, family history, social history, and previous encounter notes.   I discussed the assessment and treatment plan with the patient. The patient was provided an opportunity to ask questions and all were answered. The patient agreed with the plan and demonstrated an understanding of the instructions.   The patient was advised to call back or seek an in-person evaluation if the symptoms worsen or if the condition fails to improve as anticipated.   Total visit time 15 minutes  Ladd Picker, MD

## 2023-06-21 NOTE — Assessment & Plan Note (Signed)
 Patient has lost approximately 15 pounds since starting GLP-1 along with lifestyle changes.  Continue current weight management strategy.  See obesity treatment plan

## 2023-06-21 NOTE — Assessment & Plan Note (Signed)
 Blood pressure at home is 112/76 she is currently on amlodipine , metoprolol  without any adverse effects.  We are checking renal parameters and electrolytes.  Continue current weight management strategy

## 2023-06-21 NOTE — Assessment & Plan Note (Signed)
 On CPAP with reported good compliance. Continue PAP therapy. Losing 15% or more of body weight may improve AHI.  Continue current weight management strategy inclusive of GLP-1

## 2023-07-12 ENCOUNTER — Telehealth: Payer: Self-pay | Admitting: Cardiovascular Disease

## 2023-07-12 DIAGNOSIS — I480 Paroxysmal atrial fibrillation: Secondary | ICD-10-CM

## 2023-07-12 MED ORDER — APIXABAN 5 MG PO TABS
5.0000 mg | ORAL_TABLET | Freq: Two times a day (BID) | ORAL | 1 refills | Status: DC
Start: 2023-07-12 — End: 2023-08-24

## 2023-07-12 NOTE — Telephone Encounter (Signed)
 Refill request

## 2023-07-12 NOTE — Telephone Encounter (Signed)
 Prescription refill request for Eliquis  received. Indication: PAF Last office visit: 04/13/23  Maylene Spear NP Scr: 0.93 on 02/01/23  Epic Age: 63 Weight: 119.4kg  Based on above findings Eliquis  5mg  twice daily is the appropriate dose.  Refill approved.

## 2023-07-12 NOTE — Telephone Encounter (Signed)
*  STAT* If patient is at the pharmacy, call can be transferred to refill team.   1. Which medications need to be refilled? (please list name of each medication and dose if known) apixaban  (ELIQUIS ) 5 MG TABS tablet  Take 1 tablet (5 mg total) by mouth 2 (two) times daily.   2. Would you like to learn more about the convenience, safety, & potential cost savings by using the [facility]? No   3. Are you open to using the [facility] No   4. Which pharmacy/location (including street and city if local pharmacy) is medication to be sent to? [facility]   5. Do they need a 30 day or 90 day supply? 90 Day Supply

## 2023-07-13 ENCOUNTER — Telehealth: Payer: Self-pay | Admitting: Cardiovascular Disease

## 2023-07-13 MED ORDER — APIXABAN 5 MG PO TABS: 5.0000 mg | ORAL_TABLET | Freq: Two times a day (BID) | ORAL | 0 refills | Status: DC

## 2023-07-13 NOTE — Telephone Encounter (Signed)
 Spoke with patient and she reports calling yesterday due to shortage of her Eliquis . Advised that I would put 2 boxes aside for her to pick up. She was appreciative with no further questions

## 2023-07-13 NOTE — Telephone Encounter (Signed)
  Patient calling the office for samples of medication:   1.  What medication and dosage are you requesting samples for?  apixaban  (ELIQUIS ) 5 MG TABS tablet   2.  Are you currently out of this medication?    Script was sent to mail order but she needs enough to get her through until the meds come in the mail

## 2023-07-14 ENCOUNTER — Other Ambulatory Visit: Payer: Self-pay | Admitting: Internal Medicine

## 2023-07-14 ENCOUNTER — Other Ambulatory Visit: Payer: Self-pay

## 2023-07-14 DIAGNOSIS — M533 Sacrococcygeal disorders, not elsewhere classified: Secondary | ICD-10-CM

## 2023-07-14 MED ORDER — AMLODIPINE BESYLATE 10 MG PO TABS: 10.0000 mg | ORAL_TABLET | ORAL | 3 refills | Status: DC | PRN

## 2023-07-15 NOTE — Telephone Encounter (Signed)
 Medication refill

## 2023-07-15 NOTE — Telephone Encounter (Signed)
 Requested medications are due for refill today.  yes  Requested medications are on the active medications list.  yes  Last refill. 06/15/2023 #30 0 rf  Future visit scheduled.   yes  Notes to clinic.  Refill not delegated.    Requested Prescriptions  Pending Prescriptions Disp Refills   methocarbamol  (ROBAXIN ) 500 MG tablet [Pharmacy Med Name: METHOCARBAMOL  500 MG TABLET] 30 tablet 0    Sig: TAKE 1 TABLET BY MOUTH AT BEDTIME.     Not Delegated - Analgesics:  Muscle Relaxants Failed - 07/15/2023 12:02 PM      Failed - This refill cannot be delegated      Failed - Valid encounter within last 6 months    Recent Outpatient Visits           1 month ago Sacroiliac joint pain   [place] [facility] at [facility], Chales [place], MD       Future Appointments             In 2 months Sheron Dixons, MD [facility] at [facility], [facility]            Signed Prescriptions Disp Refills   ibuprofen  (ADVIL ) 800 MG tablet 100 tablet 0    Sig: TAKE 1 TABLET BY MOUTH EVERY 8 HOURS AS NEEDED     Analgesics:  NSAIDS Failed - 07/15/2023 12:02 PM      Failed - Manual Review: Labs are only required if the patient has taken medication for more than 8 weeks.      Failed - Valid encounter within last 12 months    Recent Outpatient Visits           1 month ago Sacroiliac joint pain   [place] [facility] at [facility], Chales [place], MD       Future Appointments             In 2 months Gala Jubilee [place], MD [facility] & Sports Medicine at [facility], [facility]            Passed - Cr in normal range and within 360 days    Creatinine  Date Value Ref Range Status  11/02/2013 1.09 0.60 - 1.30 mg/dL Final   Creatinine, Ser  Date Value Ref Range Status  02/01/2023 0.93 0.57 - 1.00 mg/dL Final         Passed - HGB in normal range and within 360 days    Hemoglobin  Date  Value Ref Range Status  01/11/2023 12.5 12.0 - 15.0 g/dL Final  16/11/9602 54.0 11.1 - 15.9 g/dL Final         Passed - PLT in normal range and within 360 days    Platelets  Date Value Ref Range Status  01/11/2023 264 150 - 400 K/uL Final  09/30/2022 293 150 - 450 x10E3/uL Final         Passed - HCT in normal range and within 360 days    HCT  Date Value Ref Range Status  01/11/2023 40.0 36.0 - 46.0 % Final   Hematocrit  Date Value Ref Range Status  09/30/2022 39.4 34.0 - 46.6 % Final         Passed - eGFR is 30 or above and within 360 days    EGFR (African American)  Date Value Ref Range Status  11/02/2013 >60  Final   GFR calc  Af Amer  Date Value Ref Range Status  07/11/2019 68 >59 mL/min/1.73 Final    Comment:    **[facility] currently reports eGFR in compliance with the current**   recommendations of the [employer]. [facility] will   update reporting as new guidelines are published from the NKF-ASN   Task force.    EGFR (Non-African Amer.)  Date Value Ref Range Status  11/02/2013 59 (L)  Final    Comment:    eGFR values <65mL/min/1.73 m2 may be an indication of chronic kidney disease (CKD). Calculated eGFR is useful in patients with stable renal function. The eGFR calculation will not be reliable in acutely ill patients when serum creatinine is changing rapidly. It is not useful in  patients on dialysis. The eGFR calculation may not be applicable to patients at the low and high extremes of body sizes, pregnant women, and vegetarians.    GFR, Estimated  Date Value Ref Range Status  01/11/2023 >60 >60 mL/min Final    Comment:    (NOTE) Calculated using the CKD-EPI Creatinine Equation (2021)    eGFR  Date Value Ref Range Status  02/01/2023 70 >59 mL/min/1.73 Final         Passed - Patient is not pregnant

## 2023-07-15 NOTE — Telephone Encounter (Signed)
 Requested Prescriptions  Pending Prescriptions Disp Refills   ibuprofen  (ADVIL ) 800 MG tablet [Pharmacy Med Name: IBUPROFEN  800 MG TABLET] 100 tablet 0    Sig: TAKE 1 TABLET BY MOUTH EVERY 8 HOURS AS NEEDED     Analgesics:  NSAIDS Failed - 07/15/2023 12:02 PM      Failed - Manual Review: Labs are only required if the patient has taken medication for more than 8 weeks.      Failed - Valid encounter within last 12 months    Recent Outpatient Visits           1 month ago Sacroiliac joint pain   [place] [facility] at [facility], Chales [place], MD       Future Appointments             In 2 months Gala Jubilee [place], MD [facility] Primary Care & Sports Medicine at [facility], [facility]            Passed - Cr in normal range and within 360 days    Creatinine  Date Value Ref Range Status  11/02/2013 1.09 0.60 - 1.30 mg/dL Final   Creatinine, Ser  Date Value Ref Range Status  02/01/2023 0.93 0.57 - 1.00 mg/dL Final         Passed - HGB in normal range and within 360 days    Hemoglobin  Date Value Ref Range Status  01/11/2023 12.5 12.0 - 15.0 g/dL Final  38/75/6433 29.5 11.1 - 15.9 g/dL Final         Passed - PLT in normal range and within 360 days    Platelets  Date Value Ref Range Status  01/11/2023 264 150 - 400 K/uL Final  09/30/2022 293 150 - 450 x10E3/uL Final         Passed - HCT in normal range and within 360 days    HCT  Date Value Ref Range Status  01/11/2023 40.0 36.0 - 46.0 % Final   Hematocrit  Date Value Ref Range Status  09/30/2022 39.4 34.0 - 46.6 % Final         Passed - eGFR is 30 or above and within 360 days    EGFR (African American)  Date Value Ref Range Status  11/02/2013 >60  Final   GFR calc Af Amer  Date Value Ref Range Status  07/11/2019 68 >59 mL/min/1.73 Final    Comment:    **[facility] currently reports eGFR in compliance with the current**   recommendations of the [employer].  [facility] will   update reporting as new guidelines are published from the NKF-ASN   Task force.    EGFR (Non-African Amer.)  Date Value Ref Range Status  11/02/2013 59 (L)  Final    Comment:    eGFR values <91mL/min/1.73 m2 may be an indication of chronic kidney disease (CKD). Calculated eGFR is useful in patients with stable renal function. The eGFR calculation will not be reliable in acutely ill patients when serum creatinine is changing rapidly. It is not useful in  patients on dialysis. The eGFR calculation may not be applicable to patients at the low and high extremes of body sizes, pregnant women, and vegetarians.    GFR, Estimated  Date Value Ref Range Status  01/11/2023 >60 >60 mL/min Final    Comment:    (NOTE) Calculated using the CKD-EPI Creatinine Equation (2021)    eGFR  Date Value Ref Range Status  02/01/2023 70 >59  mL/min/1.73 Final         Passed - Patient is not pregnant       methocarbamol  (ROBAXIN ) 500 MG tablet [Pharmacy Med Name: METHOCARBAMOL  500 MG TABLET] 30 tablet 0    Sig: TAKE 1 TABLET BY MOUTH AT BEDTIME.     Not Delegated - Analgesics:  Muscle Relaxants Failed - 07/15/2023 12:02 PM      Failed - This refill cannot be delegated      Failed - Valid encounter within last 6 months    Recent Outpatient Visits           1 month ago Sacroiliac joint pain   [place] [facility] at [facility], Chales [place], MD       Future Appointments             In 2 months Gala Jubilee, [place], MD [facility] Primary [facility] & [facility] Medicine at [facility], [facility]

## 2023-07-21 ENCOUNTER — Other Ambulatory Visit: Payer: Self-pay

## 2023-07-21 MED ORDER — AMLODIPINE BESYLATE 10 MG PO TABS: 10.0000 mg | ORAL_TABLET | ORAL | 3 refills | Status: DC | PRN

## 2023-07-23 ENCOUNTER — Other Ambulatory Visit: Payer: Self-pay

## 2023-07-23 MED ORDER — AMLODIPINE BESYLATE 10 MG PO TABS: 10.0000 mg | ORAL_TABLET | ORAL | 3 refills | Status: DC | PRN

## 2023-08-03 ENCOUNTER — Telehealth: Payer: Self-pay | Admitting: *Deleted

## 2023-08-03 NOTE — Telephone Encounter (Signed)
 Request Reference Number: [id number]. ZEPBOUND  INJ 2.5/0.5 is approved through 02/02/2024. Your patient may now fill this prescription and it will be covered.. Authorization Expiration Date: February 02, 2024.

## 2023-08-03 NOTE — Telephone Encounter (Signed)
 Prior authorization done via cover my meds for patients Zepbound. Waiting on determination.

## 2023-08-06 ENCOUNTER — Other Ambulatory Visit: Payer: Self-pay

## 2023-08-06 MED ORDER — AMLODIPINE BESYLATE 10 MG PO TABS: 10.0000 mg | ORAL_TABLET | Freq: Every day | ORAL | 1 refills | Status: DC | PRN

## 2023-08-06 MED ORDER — AMLODIPINE BESYLATE 10 MG PO TABS: 10.0000 mg | ORAL_TABLET | ORAL | 1 refills | Status: DC | PRN

## 2023-08-06 NOTE — Addendum Note (Signed)
 Addended by: Gayleen Kawasaki D on: 08/06/2023 10:10 AM   Modules accepted: Orders

## 2023-08-18 ENCOUNTER — Other Ambulatory Visit: Payer: Self-pay | Admitting: Internal Medicine

## 2023-08-19 NOTE — Telephone Encounter (Signed)
 Requested Prescriptions  Pending Prescriptions Disp Refills   furosemide  (LASIX ) 20 MG tablet [Pharmacy Med Name: FUROSEMIDE  20 MG TABLET] 30 tablet 2    Sig: TAKE 1 TABLET BY MOUTH EVERY DAY     Cardiovascular:  Diuretics - Loop Failed - 08/19/2023  1:46 PM      Failed - K in normal range and within 180 days    Potassium  Date Value Ref Range Status  02/01/2023 4.3 3.5 - 5.2 mmol/L Final  09/19/2012 3.6 3.5 - 5.1 mmol/L Final         Failed - Ca in normal range and within 180 days    Calcium   Date Value Ref Range Status  02/01/2023 9.1 8.7 - 10.3 mg/dL Final   Calcium , Total  Date Value Ref Range Status  02/22/2012 9.0 8.5 - 10.1 mg/dL Final         Failed - Na in normal range and within 180 days    Sodium  Date Value Ref Range Status  02/01/2023 141 134 - 144 mmol/L Final  02/22/2012 137 136 - 145 mmol/L Final         Failed - Cr in normal range and within 180 days    Creatinine  Date Value Ref Range Status  11/02/2013 1.09 0.60 - 1.30 mg/dL Final   Creatinine, Ser  Date Value Ref Range Status  02/01/2023 0.93 0.57 - 1.00 mg/dL Final         Failed - Cl in normal range and within 180 days    Chloride  Date Value Ref Range Status  02/01/2023 103 96 - 106 mmol/L Final  02/22/2012 101 98 - 107 mmol/L Final         Failed - Mg Level in normal range and within 180 days    Magnesium  Date Value Ref Range Status  12/06/2021 1.9 1.7 - 2.4 mg/dL Final    Comment:    Performed at [facility], [address] [place], [place] [address]  09/19/2012 2.1 mg/dL Final    Comment:    8.1-7.5 THERAPEUTIC RANGE: 4-7 mg/dL TOXIC: > 10 mg/dL  ----------------------- LABS - This specimen was collected through an   - indwelling catheter or arterial line.  - A minimum of of blood was wasted prior    - to collecting the sample.  Interpret  - results with caution.          Failed - Valid encounter within last 6 months    Recent Outpatient Visits            2 months ago Sacroiliac joint pain   [place] [facility] at [facility], Leita DEL, MD       Future Appointments             In 1 month Justus Leita DEL, MD [facility] Primary Care & Sports Medicine at [facility], [facility]            Passed - Last BP in normal range    BP Readings from Last 1 Encounters:  06/21/23 112/76

## 2023-08-24 ENCOUNTER — Encounter (INDEPENDENT_AMBULATORY_CARE_PROVIDER_SITE_OTHER): Payer: Self-pay | Admitting: Internal Medicine

## 2023-08-24 ENCOUNTER — Ambulatory Visit (INDEPENDENT_AMBULATORY_CARE_PROVIDER_SITE_OTHER): Admitting: Internal Medicine

## 2023-08-24 VITALS — BP 138/82 | HR 72 | Temp 97.9°F | Ht 68.0 in | Wt 244.0 lb

## 2023-08-24 DIAGNOSIS — E66813 Obesity, class 3: Secondary | ICD-10-CM

## 2023-08-24 DIAGNOSIS — I10 Essential (primary) hypertension: Secondary | ICD-10-CM | POA: Diagnosis not present

## 2023-08-24 DIAGNOSIS — Z6841 Body Mass Index (BMI) 40.0 and over, adult: Secondary | ICD-10-CM

## 2023-08-24 DIAGNOSIS — E559 Vitamin D deficiency, unspecified: Secondary | ICD-10-CM

## 2023-08-24 DIAGNOSIS — E7800 Pure hypercholesterolemia, unspecified: Secondary | ICD-10-CM

## 2023-08-24 DIAGNOSIS — R7303 Prediabetes: Secondary | ICD-10-CM

## 2023-08-24 DIAGNOSIS — E669 Obesity, unspecified: Secondary | ICD-10-CM | POA: Insufficient documentation

## 2023-08-24 MED ORDER — TIRZEPATIDE-WEIGHT MANAGEMENT 5 MG/0.5ML ~~LOC~~ SOAJ
5.0000 mg | SUBCUTANEOUS | 0 refills | Status: DC
Start: 2023-08-24 — End: 2023-09-27

## 2023-08-24 NOTE — Assessment & Plan Note (Signed)
 Blood pressure recorded at 138/82 mmHg, slightly above the target of less then 130 / 80 mmHg. Currently on amlodipine  and metoprolol . Weight loss and lifestyle modifications are expected to further improve blood pressure control. - Continue amlodipine  and metoprolol  as prescribed - Encourage weight loss and lifestyle modifications to improve blood pressure

## 2023-08-24 NOTE — Assessment & Plan Note (Signed)
 LDL is not at goal. Elevated LDL may be secondary to nutrition, genetics and spillover effect from excess adiposity. Recommended LDL goal is <70 to reduce the risk of fatty streaks and the progression to obstructive ASCVD in the future.    Lab Results  Component Value Date   CHOL 233 (H) 02/01/2023   HDL 50 02/01/2023   LDLCALC 165 (H) 02/01/2023   TRIG 101 02/01/2023   CHOLHDL 4.7 (H) 02/01/2023    Check fasting lipid profile today and assess cardiovascular risk for further recommendations

## 2023-08-24 NOTE — Progress Notes (Signed)
 Office: [phone]  /  Fax: [phone]  Weight Summary and Body Composition Analysis (BIA)  Vitals Temp: 97.9 F (36.6 C) BP: 138/82 Pulse Rate: 72 SpO2: 97 %   Anthropometric Measurements Height: 5' 8 (1.727 m) Weight: 244 lb (110.7 kg) BMI (Calculated): 37.11 Weight at Last Visit: 254 lb Weight Lost Since Last Visit: 10 lb Weight Gained Since Last Visit: 0 lb Starting Weight: 263 lb Total Weight Loss (lbs): 19 lb (8.618 kg) Peak Weight: 264 lb   Body Composition  Body Fat %: 38.6 % Fat Mass (lbs): 94.2 lbs Muscle Mass (lbs): 142.2 lbs Total Body Water (lbs): 86.8 lbs Visceral Fat Rating : 12    RMR: 1814  Today's Visit #: 10  Starting Date: 10/01/22   Subjective   Chief Complaint: Obesity  Interval History Discussed the use of AI scribe software for clinical note transcription with the patient, who gave verbal consent to proceed.  History of Present Illness   Heather Dudley is a 63 year old female who presents for medical weight management.  She has lost ten pounds since her last office visit. She follows a 1500 calorie nutrition plan 75% of the time and is eating more whole foods. She is not tracking calories but is getting the recommended amount of protein. Hydration could be improved, and she occasionally skips meals. She exercises four days a week for about thirty minutes each session.  She has been on a 5 mg dose of Zepbound  for about two months. The medication helps with 'food noise' and hunger, particularly on the first day of taking it, but its effects wear off by the time her next dose is due. She experiences a sense of fullness when eating, especially in the initial days after taking the medication, but this diminishes over time. Occasional constipation and a tinge of nausea are noted, which are not constant or severe. Constipation is managed with medication.  She has a history of vitamin D  deficiency and was taking high dose vitamin D .  A test to check her vitamin D  and cholesterol levels was planned. She is also taking amlodipine  10 mg in the morning and metoprolol , though the dose was not specified.  She mentions a 'love-hate relationship' with water, as it causes frequent urination. She tries to drink water during the day but often forgets, estimating her intake at about three sixteen-ounce servings daily. She enjoys fruits and vegetables, which she consumes regularly. A multivitamin with minerals is taken most of the time.       Challenges affecting patient progress: none.    Pharmacotherapy for weight management: She is currently taking Zepbound  with adequate clinical response  and without side effects..   Assessment and Plan   Treatment Plan For Obesity:  Recommended Dietary Goals  Heather Dudley is currently in the action stage of change. As such, her goal is to continue weight management plan. She has agreed to: continue current plan  Behavioral Health and Counseling  We discussed the following behavioral modification strategies today: continue to work on maintaining a reduced calorie state, getting the recommended amount of protein, incorporating whole foods, making healthy choices, staying well hydrated and practicing mindfulness when eating..  Additional education and resources provided today: None  Recommended Physical Activity Goals  Heather Dudley has been advised to work up to 150 minutes of moderate intensity aerobic activity a week and strengthening exercises 2-3 times per week for cardiovascular health, weight loss maintenance and preservation of muscle mass.   She has agreed  to :  Think about enjoyable ways to increase daily physical activity and overcoming barriers to exercise and Increase physical activity in their day and reduce sedentary time (increase NEAT).  Medical Interventions and Pharmacotherapy  We discussed various medication options to help Heather Dudley with her weight loss efforts and we both  agreed to : Adequate clinical response to anti-obesity medication, continue current regimen and consider increasing Zepbound  at the next visit if there is a waning off effect  Associated Conditions Impacted by Obesity Treatment  Assessment & Plan Class 3 severe obesity with serious comorbidity and body mass index (BMI) of 40.0 to 44.9 in adult, unspecified obesity type She is undergoing medical weight management with a 10-pound weight loss since the last visit. She follows a 1500 calorie nutrition plan, consumes more whole foods, and exercises four days a week. Currently on 5 mg of Zepbound  for appetite suppression, which is initially effective but wears off before the next dose. Experiencing mild constipation and sporadic nausea as side effects. Body composition analysis shows a decrease in body fat percentage, indicating a leaner body. Discussed increasing the dose to 7.5 mg, considering potential increase in side effects like nausea and constipation. Shared decision to maintain current dose for another month to monitor progress and side effects. If increased to 10 mg, expected weight loss is about 20%, compared to 15% with the current dose. - Continue Zepbound  at 5 mg for one more month - Monitor for nausea and constipation - Encourage hydration and intake of fruits and vegetables to manage constipation - Reassess weight loss and side effects in four weeks - Consider increasing Zepbound  to 7.5 mg if appetite suppression is insufficient Vitamin D  deficiency Most recent vitamin D  levels  Lab Results  Component Value Date   VD25OH 13.2 (L) 10/21/2022   VD25OH 20.0 (L) 07/01/2016   VD25OH 30.5 08/21/2015     Deficiency state associated with adiposity and may result in leptin resistance, weight gain and fatigue.   Plan: Check vitamin D  levels today  Prediabetes Hemoglobin A1c of 6.2.  Now on Zepbound  5 once a week for pharmacoprophylaxis.  She will continue medication.  Check hemoglobin A1c  today. Pure hypercholesterolemia LDL is not at goal. Elevated LDL may be secondary to nutrition, genetics and spillover effect from excess adiposity. Recommended LDL goal is <70 to reduce the risk of fatty streaks and the progression to obstructive ASCVD in the future.    Lab Results  Component Value Date   CHOL 233 (H) 02/01/2023   HDL 50 02/01/2023   LDLCALC 165 (H) 02/01/2023   TRIG 101 02/01/2023   CHOLHDL 4.7 (H) 02/01/2023    Check fasting lipid profile today and assess cardiovascular risk for further recommendations  Essential hypertension   Blood pressure recorded at 138/82 mmHg, slightly above the target of less then 130 / 80 mmHg. Currently on amlodipine  and metoprolol . Weight loss and lifestyle modifications are expected to further improve blood pressure control. - Continue amlodipine  and metoprolol  as prescribed - Encourage weight loss and lifestyle modifications to improve blood pressure    General Health Maintenance Advised to maintain adequate hydration, consume sufficient protein, and take a multivitamin with minerals due to dietary restrictions from weight loss efforts. Encouraged to find enjoyable physical activities to increase exercise adherence. - Encourage hydration and intake of 90 grams of protein per day - Recommend taking a multivitamin with minerals - Encourage finding enjoyable physical activities for exercise        Objective  Physical Exam:  Blood pressure 138/82, pulse 72, temperature 97.9 F (36.6 C), height 5' 8 (1.727 m), weight 244 lb (110.7 kg), last menstrual period 04/01/2008, SpO2 97%. Body mass index is 37.1 kg/m.  General: She is overweight, cooperative, alert, well developed, and in no acute distress. PSYCH: Has normal mood, affect and thought process.   HEENT: EOMI, sclerae are anicteric. Lungs: Normal breathing effort, no conversational dyspnea. Extremities: No edema.  Neurologic: No gross sensory or motor deficits. No tremors  or fasciculations noted.    Diagnostic Data Reviewed:  BMET    Component Value Date/Time   NA 141 02/01/2023 1154   NA 137 02/22/2012 1443   K 4.3 02/01/2023 1154   K 3.6 09/19/2012 1011   CL 103 02/01/2023 1154   CL 101 02/22/2012 1443   CO2 24 02/01/2023 1154   CO2 27 02/22/2012 1443   GLUCOSE 93 02/01/2023 1154   GLUCOSE 106 (H) 01/11/2023 0836   GLUCOSE 102 (H) 02/22/2012 1443   BUN 12 02/01/2023 1154   BUN 13 02/22/2012 1443   CREATININE 0.93 02/01/2023 1154   CREATININE 1.09 11/02/2013 0903   CALCIUM  9.1 02/01/2023 1154   CALCIUM  9.0 02/22/2012 1443   GFRNONAA >60 01/11/2023 0836   GFRNONAA 59 (L) 11/02/2013 0903   GFRAA 68 07/11/2019 1143   GFRAA >60 11/02/2013 0903   Lab Results  Component Value Date   HGBA1C 6.0 (H) 02/01/2023   HGBA1C 5.8 (H) 07/11/2019   Lab Results  Component Value Date   INSULIN  19.6 10/21/2022   Lab Results  Component Value Date   TSH 1.140 12/26/2021   CBC    Component Value Date/Time   WBC 8.3 01/11/2023 0836   RBC 4.58 01/11/2023 0836   HGB 12.5 01/11/2023 0836   HGB 12.8 09/30/2022 1041   HCT 40.0 01/11/2023 0836   HCT 39.4 09/30/2022 1041   PLT 264 01/11/2023 0836   PLT 293 09/30/2022 1041   MCV 87.3 01/11/2023 0836   MCV 84 09/30/2022 1041   MCV 87 11/02/2013 0903   MCH 27.3 01/11/2023 0836   MCHC 31.3 01/11/2023 0836   RDW 13.8 01/11/2023 0836   RDW 13.5 09/30/2022 1041   RDW 13.9 11/02/2013 0903   Iron Studies No results found for: IRON, TIBC, FERRITIN, IRONPCTSAT Lipid Panel     Component Value Date/Time   CHOL 233 (H) 02/01/2023 1154   TRIG 101 02/01/2023 1154   HDL 50 02/01/2023 1154   CHOLHDL 4.7 (H) 02/01/2023 1154   LDLCALC 165 (H) 02/01/2023 1154   Hepatic Function Panel     Component Value Date/Time   PROT 6.6 02/01/2023 1154   PROT 7.6 11/02/2013 0903   ALBUMIN 4.1 02/01/2023 1154   ALBUMIN 3.5 11/02/2013 0903   AST 13 02/01/2023 1154   AST 18 11/02/2013 0903   ALT 14 02/01/2023  1154   ALT 20 11/02/2013 0903   ALKPHOS 98 02/01/2023 1154   ALKPHOS 97 11/02/2013 0903   BILITOT 0.3 02/01/2023 1154   BILITOT 0.4 11/02/2013 0903   BILIDIR <0.1 (L) 11/06/2015 1528   IBILI NOT CALCULATED 11/06/2015 1528      Component Value Date/Time   TSH 1.140 12/26/2021 1303   TSH 2.430 07/15/2021 1039   Nutritional Lab Results  Component Value Date   VD25OH 13.2 (L) 10/21/2022   VD25OH 20.0 (L) 07/01/2016   VD25OH 30.5 08/21/2015    Medications: Outpatient Encounter Medications as of 08/24/2023  Medication Sig Note   amLODipine  (NORVASC ) 10  MG tablet Take 1 tablet (10 mg total) by mouth daily as needed (For chest pain).    apixaban  (ELIQUIS ) 5 MG TABS tablet Take 1 tablet (5 mg total) by mouth 2 (two) times daily.    B Complex-Biotin-FA (B-COMPLEX PO) Take 1 tablet by mouth daily.    colchicine  0.6 MG tablet Take 1 tablet (0.6 mg total) by mouth 2 (two) times daily for 5 days.    ezetimibe  (ZETIA ) 10 MG tablet Take 1 tablet (10 mg total) by mouth daily.    famotidine  (PEPCID ) 40 MG tablet Take 1 tablet (40 mg total) by mouth daily.    furosemide  (LASIX ) 20 MG tablet TAKE 1 TABLET BY MOUTH EVERY DAY    ibuprofen  (ADVIL ) 800 MG tablet TAKE 1 TABLET BY MOUTH EVERY 8 HOURS AS NEEDED    levothyroxine  (SYNTHROID ) 75 MCG tablet Take 75 mcg by mouth daily.    LORazepam  (ATIVAN ) 0.5 MG tablet Take 1 tablet (0.5 mg total) by mouth every 8 (eight) hours as needed. for anxiety    methocarbamol  (ROBAXIN ) 500 MG tablet TAKE 1 TABLET BY MOUTH AT BEDTIME.    metoprolol  succinate (TOPROL -XL) 50 MG 24 hr tablet Take 1.5 tablets (75 mg total) by mouth 2 (two) times daily. Take with or immediately following a meal.    Multiple Vitamins-Minerals (MULTIVITAMIN WITH MINERALS) tablet Take 1 tablet by mouth daily.    nitroGLYCERIN  (NITROSTAT ) 0.4 MG SL tablet Place 1 tablet (0.4 mg total) under the tongue every 5 (five) minutes as needed for chest pain.    potassium chloride  SA (KLOR-CON  M) 20 MEQ  tablet Take 1 tablet (20 mEq total) by mouth daily. 04/13/2023: PRN   tirzepatide  (ZEPBOUND ) 5 MG/0.5ML Pen Inject 5 mg into the skin once a week.    Vitamin D , Ergocalciferol , (DRISDOL ) 1.25 MG (50000 UNIT) CAPS capsule Take 1 capsule (50,000 Units total) by mouth every 7 (seven) days.    [DISCONTINUED] apixaban  (ELIQUIS ) 5 MG TABS tablet Take 1 tablet (5 mg total) by mouth 2 (two) times daily.    [DISCONTINUED] tirzepatide  (ZEPBOUND ) 5 MG/0.5ML Pen Inject 5 mg into the skin once a week.    No facility-administered encounter medications on file as of 08/24/2023.     Follow-Up   No follow-ups on file.SABRA She was informed of the importance of frequent follow up visits to maximize her success with intensive lifestyle modifications for her multiple health conditions.  Attestation Statement   Reviewed by clinician on day of visit: allergies, medications, problem list, medical history, surgical history, family history, social history, and previous encounter notes.     Lucas Parker, MD

## 2023-08-24 NOTE — Assessment & Plan Note (Signed)
 She is undergoing medical weight management with a 10-pound weight loss since the last visit. She follows a 1500 calorie nutrition plan, consumes more whole foods, and exercises four days a week. Currently on 5 mg of Zepbound  for appetite suppression, which is initially effective but wears off before the next dose. Experiencing mild constipation and sporadic nausea as side effects. Body composition analysis shows a decrease in body fat percentage, indicating a leaner body. Discussed increasing the dose to 7.5 mg, considering potential increase in side effects like nausea and constipation. Shared decision to maintain current dose for another month to monitor progress and side effects. If increased to 10 mg, expected weight loss is about 20%, compared to 15% with the current dose. - Continue Zepbound  at 5 mg for one more month - Monitor for nausea and constipation - Encourage hydration and intake of fruits and vegetables to manage constipation - Reassess weight loss and side effects in four weeks - Consider increasing Zepbound  to 7.5 mg if appetite suppression is insufficient

## 2023-08-24 NOTE — Assessment & Plan Note (Signed)
 Most recent vitamin D  levels  Lab Results  Component Value Date   VD25OH 13.2 (L) 10/21/2022   VD25OH 20.0 (L) 07/01/2016   VD25OH 30.5 08/21/2015     Deficiency state associated with adiposity and may result in leptin resistance, weight gain and fatigue.   Plan: Check vitamin D  levels today

## 2023-08-24 NOTE — Assessment & Plan Note (Signed)
 Hemoglobin A1c of 6.2.  Now on Zepbound  5 once a week for pharmacoprophylaxis.  She will continue medication.  Check hemoglobin A1c today.

## 2023-08-25 ENCOUNTER — Ambulatory Visit (INDEPENDENT_AMBULATORY_CARE_PROVIDER_SITE_OTHER): Payer: Self-pay | Admitting: Internal Medicine

## 2023-08-25 ENCOUNTER — Other Ambulatory Visit (INDEPENDENT_AMBULATORY_CARE_PROVIDER_SITE_OTHER): Payer: Self-pay | Admitting: Internal Medicine

## 2023-08-25 DIAGNOSIS — E559 Vitamin D deficiency, unspecified: Secondary | ICD-10-CM

## 2023-08-25 LAB — LIPID PANEL WITH LDL/HDL RATIO
Cholesterol, Total: 259 mg/dL — ABNORMAL HIGH (ref 100–199)
HDL: 43 mg/dL (ref >39)
LDL Chol Calc (NIH): 194 mg/dL — ABNORMAL HIGH (ref 0–99)
LDL/HDL Ratio: 4.5 ratio — ABNORMAL HIGH (ref 0.0–3.2)
Triglycerides: 120 mg/dL (ref 0–149)
VLDL Cholesterol Cal: 22 mg/dL (ref 5–40)

## 2023-08-25 LAB — HEMOGLOBIN A1C
Est. average glucose Bld gHb Est-mCnc: 117 mg/dL
Hgb A1c MFr Bld: 5.7 % — ABNORMAL HIGH (ref 4.8–5.6)

## 2023-08-25 LAB — VITAMIN D 25 HYDROXY (VIT D DEFICIENCY, FRACTURES): Vit D, 25-Hydroxy: 28 ng/mL — ABNORMAL LOW (ref 30.0–100.0)

## 2023-08-25 MED ORDER — VITAMIN D (ERGOCALCIFEROL) 1.25 MG (50000 UNIT) PO CAPS: 50000.0000 [IU] | ORAL_CAPSULE | ORAL | 0 refills | Status: DC

## 2023-09-27 ENCOUNTER — Encounter (INDEPENDENT_AMBULATORY_CARE_PROVIDER_SITE_OTHER): Payer: Self-pay | Admitting: Internal Medicine

## 2023-09-27 ENCOUNTER — Ambulatory Visit (INDEPENDENT_AMBULATORY_CARE_PROVIDER_SITE_OTHER): Admitting: Internal Medicine

## 2023-09-27 VITALS — BP 131/83 | HR 76 | Temp 98.5°F | Ht 68.0 in | Wt 243.0 lb

## 2023-09-27 DIAGNOSIS — K760 Fatty (change of) liver, not elsewhere classified: Secondary | ICD-10-CM | POA: Diagnosis not present

## 2023-09-27 DIAGNOSIS — R7303 Prediabetes: Secondary | ICD-10-CM | POA: Diagnosis not present

## 2023-09-27 DIAGNOSIS — G4733 Obstructive sleep apnea (adult) (pediatric): Secondary | ICD-10-CM | POA: Diagnosis not present

## 2023-09-27 DIAGNOSIS — Z6841 Body Mass Index (BMI) 40.0 and over, adult: Secondary | ICD-10-CM

## 2023-09-27 DIAGNOSIS — I10 Essential (primary) hypertension: Secondary | ICD-10-CM

## 2023-09-27 DIAGNOSIS — E7800 Pure hypercholesterolemia, unspecified: Secondary | ICD-10-CM

## 2023-09-27 DIAGNOSIS — E66813 Obesity, class 3: Secondary | ICD-10-CM

## 2023-09-27 MED ORDER — ZEPBOUND 7.5 MG/0.5ML ~~LOC~~ SOAJ: 7.5000 mg | SUBCUTANEOUS | 0 refills | Status: DC

## 2023-09-27 NOTE — Assessment & Plan Note (Signed)
 Blood pressure close to goal, target of less then 130 / 80 mmHg. Currently on amlodipine  and metoprolol . Weight loss and lifestyle modifications are expected to further improve blood pressure control. - Continue amlodipine  and metoprolol  as prescribed - We are increasing Zepbound  to 7.5 mg once a week may also have an effect on blood pressure control

## 2023-09-27 NOTE — Assessment & Plan Note (Signed)
 Total body weight loss 20 pounds with improvements in SAT and VAT.She has lost one pound since the last visit, totaling a weight loss of twenty pounds. She follows a 1500 calorie nutrition plan 50-60% of the time, tracks calories, eats more whole foods, and exercises 3-4 days a week for about 30 minutes. Reports inadequate sleep and occasional meal skipping. Currently on Zepbound  5 mg since March, with adequate appetite control but considering increasing to 7.5 mg for potentially better results. - Increase Zepbound  to 7.5 mg. - Monitor for side effects such as nausea and constipation. - Encourage reduction of snacking and ensure three meals a day. - Suggest healthy snacks like fruits, Greek yogurt, and veggies. - Recommend protein smoothies as meal replacements. - Encourage tracking steps and scheduling walks during work hours.

## 2023-09-27 NOTE — Assessment & Plan Note (Signed)
 Hyperlipidemia likely familial, with previous intolerance to statins such as atorvastatin  and rosuvastatin . Currently on Zetia , with cholesterol levels decreasing slowly. Discussed the potential use of Repatha, an injectable cholesterol medication, for patients intolerant to statins. - Discuss Repatha with PCP for cardiovascular risk reduction - History of catheterization with nonobstructive CAD

## 2023-09-27 NOTE — Assessment & Plan Note (Signed)
 Detected on ultrasound in 2013.  Most recent liver enzymes are within normal limits.   Fibrosis 4 Score = 1.11 (Low risk)        Interpretation for patients with NAFLD          <1.30       -  F0-F1 (Low risk)          1.30-2.67 -  Indeterminate           >2.67      -  F3-F4 (High risk)     Validated for ages 68-65   No further work-up. Losing 15% may improve conditions also avoiding alcohol and reducing simple and processed carbs.  Continue current weight management strategy inclusive of GLP-1.

## 2023-09-27 NOTE — Progress Notes (Signed)
 Office: [phone]  /  Fax: [phone]  Weight Summary and Body Composition Analysis (BIA)  Vitals Temp: 98.5 F (36.9 C) BP: 131/83 Pulse Rate: 76 SpO2: 99 %   Anthropometric Measurements Height: 5' 8 (1.727 m) Weight: 243 lb (110.2 kg) BMI (Calculated): 36.96 Weight at Last Visit: 244 lb Weight Lost Since Last Visit: 1 lb Weight Gained Since Last Visit: 0 lb Starting Weight: 263 lb Total Weight Loss (lbs): 20 lb (9.072 kg) Peak Weight: 264 lb   Body Composition  Body Fat %: 39.1 % Fat Mass (lbs): 95 lbs Muscle Mass (lbs): 140.8 lbs Total Body Water (lbs): 88 lbs Visceral Fat Rating : 12     RMR: 1814  Today's Visit #: 10  Starting Date: 10/01/22   Subjective   Chief Complaint: Obesity  Interval History Discussed the use of AI scribe software for clinical note transcription with the patient, who gave verbal consent to proceed.  History of Present Illness   Heather Dudley is a 63 year old female with hypertension and prediabetes who presents for medical weight management.  She has lost a total of twenty pounds, with a one-pound loss since her last visit. She follows a fifteen hundred calorie nutrition plan about fifty to sixty percent of the time, tracks calories, eats more whole foods, and gets the recommended amount of protein. However, she occasionally skips meals and reports inadequate sleep. She exercises three to four days a week for about thirty minutes each session.  She has been on Zepbound  since January and increased to five milligrams in March. She reports constipation, which she manages with a Senna-based laxative taken once daily. She also experienced recent nausea, which she attributes to a low-calorie juice rather than the medication.  She has a history of high cholesterol, which she believes is genetic. She has previously taken statins, which effectively lowered her cholesterol but caused her to feel unwell. She is currently taking  Zetia , which has slowly reduced her cholesterol levels. She had a heart catheterization in the past, which showed normal coronary arteries, and a history of atrial fibrillation treated with heart ablation.  Her office flooded, leading her to work from home, which has reduced her daily physical activity. She tries to compensate by walking on a treadmill and doing activities like watering plants to increase her steps. She acknowledges increased snacking due to being at home and the hot weather, which discourages outdoor walking.       Challenges affecting patient progress: Sedentary job.    Pharmacotherapy for weight management: She is currently taking Zepbound  with adequate clinical response  and without side effects..   Assessment and Plan   Treatment Plan For Obesity:  Recommended Dietary Goals  Heather Dudley is currently in the action stage of change. As such, her goal is to continue weight management plan. She has agreed to: continue current plan  Behavioral Health and Counseling  We discussed the following behavioral modification strategies today: avoiding temptations and identifying enticing environmental cues, continue to practice mindfulness when eating, planning for success, better snacking choices, and avoidance of unnecessary snacking.  Additional education and resources provided today: None  Recommended Physical Activity Goals  Heather Dudley has been advised to work up to 150 minutes of moderate intensity aerobic activity a week and strengthening exercises 2-3 times per week for cardiovascular health, weight loss maintenance and preservation of muscle mass.   She has agreed to :  Think about enjoyable ways to increase daily physical activity and overcoming barriers  to exercise and Increase physical activity in their day and reduce sedentary time (increase NEAT).  Medical Interventions and Pharmacotherapy  We discussed various medication options to help [name] with her weight  loss efforts and we both agreed to : Increase Zepbound  to 7.5 mg once a week  Associated Conditions Impacted by Obesity Treatment  Assessment & Plan Class 3 severe obesity with serious comorbidity and body mass index (BMI) of 40.0 to 44.9 in adult, unspecified obesity type Total body weight loss 20 pounds with improvements in SAT and VAT.She has lost one pound since the last visit, totaling a weight loss of twenty pounds. She follows a 1500 calorie nutrition plan 50-60% of the time, tracks calories, eats more whole foods, and exercises 3-4 days a week for about 30 minutes. Reports inadequate sleep and occasional meal skipping. Currently on Zepbound  5 mg since March, with adequate appetite control but considering increasing to 7.5 mg for potentially better results. - Increase Zepbound  to 7.5 mg. - Monitor for side effects such as nausea and constipation. - Encourage reduction of snacking and ensure three meals a day. - Suggest healthy snacks like fruits, Greek yogurt, and veggies. - Recommend protein smoothies as meal replacements. - Encourage tracking steps and scheduling walks during work hours. OSA on CPAP On CPAP with reported good compliance. Continue PAP therapy. Losing 15% or more of body weight may improve AHI.  Continue current weight management strategy inclusive of GLP-1 Metabolic dysfunction-associated steatotic liver disease (MASLD) Detected on ultrasound in 2013.  Most recent liver enzymes are within normal limits.   Fibrosis 4 Score = 1.11 (Low risk)        Interpretation for patients with NAFLD          <1.30       -  F0-F1 (Low risk)          1.30-2.67 -  Indeterminate           >2.67      -  F3-F4 (High risk)     Validated for ages 76-65   No further work-up. Losing 15% may improve conditions also avoiding alcohol and reducing simple and processed carbs.  Continue current weight management strategy inclusive of GLP-1.  Prediabetes Most recent A1c is now down to 5.7.  She is  currently on Zepbound  for pharmacoprophylaxis and will continue medication along with nutritional and behavioral strategies for weight management. Essential hypertension   Blood pressure close to goal, target of less then 130 / 80 mmHg. Currently on amlodipine  and metoprolol . Weight loss and lifestyle modifications are expected to further improve blood pressure control. - Continue amlodipine  and metoprolol  as prescribed - We are increasing Zepbound  to 7.5 mg once a week may also have an effect on blood pressure control Pure hypercholesterolemia Hyperlipidemia likely familial, with previous intolerance to statins such as atorvastatin  and rosuvastatin . Currently on Zetia , with cholesterol levels decreasing slowly. Discussed the potential use of Repatha, an injectable cholesterol medication, for patients intolerant to statins. - Discuss Repatha with PCP for cardiovascular risk reduction - History of catheterization with nonobstructive CAD   Follow-up Plan to follow up in four weeks to assess progress and adjust treatment as necessary. - Schedule follow-up appointment in four weeks.        Objective   Physical Exam:  Blood pressure 131/83, pulse 76, temperature 98.5 F (36.9 C), height 5' 8 (1.727 m), weight 243 lb (110.2 kg), last menstrual period 04/01/2008, SpO2 99%. Body mass index is 36.95 kg/m.  General:  She is overweight, cooperative, alert, well developed, and in no acute distress. PSYCH: Has normal mood, affect and thought process.   HEENT: EOMI, sclerae are anicteric. Lungs: Normal breathing effort, no conversational dyspnea. Extremities: No edema.  Neurologic: No gross sensory or motor deficits. No tremors or fasciculations noted.    Diagnostic Data Reviewed:  BMET    Component Value Date/Time   NA 141 02/01/2023 1154   NA 137 02/22/2012 1443   K 4.3 02/01/2023 1154   K 3.6 09/19/2012 1011   CL 103 02/01/2023 1154   CL 101 02/22/2012 1443   CO2 24 02/01/2023 1154    CO2 27 02/22/2012 1443   GLUCOSE 93 02/01/2023 1154   GLUCOSE 106 (H) 01/11/2023 0836   GLUCOSE 102 (H) 02/22/2012 1443   BUN 12 02/01/2023 1154   BUN 13 02/22/2012 1443   CREATININE 0.93 02/01/2023 1154   CREATININE 1.09 11/02/2013 0903   CALCIUM  9.1 02/01/2023 1154   CALCIUM  9.0 02/22/2012 1443   GFRNONAA >60 01/11/2023 0836   GFRNONAA 59 (L) 11/02/2013 0903   GFRAA 68 07/11/2019 1143   GFRAA >60 11/02/2013 0903   Lab Results  Component Value Date   HGBA1C 5.7 (H) 08/24/2023   HGBA1C 5.8 (H) 07/11/2019   Lab Results  Component Value Date   INSULIN  19.6 10/21/2022   Lab Results  Component Value Date   TSH 1.140 12/26/2021   CBC    Component Value Date/Time   WBC 8.3 01/11/2023 0836   RBC 4.58 01/11/2023 0836   HGB 12.5 01/11/2023 0836   HGB 12.8 09/30/2022 1041   HCT 40.0 01/11/2023 0836   HCT 39.4 09/30/2022 1041   PLT 264 01/11/2023 0836   PLT 293 09/30/2022 1041   MCV 87.3 01/11/2023 0836   MCV 84 09/30/2022 1041   MCV 87 11/02/2013 0903   MCH 27.3 01/11/2023 0836   MCHC 31.3 01/11/2023 0836   RDW 13.8 01/11/2023 0836   RDW 13.5 09/30/2022 1041   RDW 13.9 11/02/2013 0903   Iron Studies No results found for: IRON, TIBC, FERRITIN, IRONPCTSAT Lipid Panel     Component Value Date/Time   CHOL 259 (H) 08/24/2023 0928   TRIG 120 08/24/2023 0928   HDL 43 08/24/2023 0928   CHOLHDL 4.7 (H) 02/01/2023 1154   LDLCALC 194 (H) 08/24/2023 0928   Hepatic Function Panel     Component Value Date/Time   PROT 6.6 02/01/2023 1154   PROT 7.6 11/02/2013 0903   ALBUMIN 4.1 02/01/2023 1154   ALBUMIN 3.5 11/02/2013 0903   AST 13 02/01/2023 1154   AST 18 11/02/2013 0903   ALT 14 02/01/2023 1154   ALT 20 11/02/2013 0903   ALKPHOS 98 02/01/2023 1154   ALKPHOS 97 11/02/2013 0903   BILITOT 0.3 02/01/2023 1154   BILITOT 0.4 11/02/2013 0903   BILIDIR <0.1 (L) 11/06/2015 1528   IBILI NOT CALCULATED 11/06/2015 1528      Component Value Date/Time   TSH 1.140  12/26/2021 1303   TSH 2.430 07/15/2021 1039   Nutritional Lab Results  Component Value Date   VD25OH 28.0 (L) 08/24/2023   VD25OH 13.2 (L) 10/21/2022   VD25OH 20.0 (L) 07/01/2016    Medications: Outpatient Encounter Medications as of 09/27/2023  Medication Sig Note   amLODipine  (NORVASC ) 10 MG tablet Take 1 tablet (10 mg total) by mouth daily as needed (For chest pain).    apixaban  (ELIQUIS ) 5 MG TABS tablet Take 1 tablet (5 mg total) by mouth 2 (two) times  daily.    B Complex-Biotin-FA (B-COMPLEX PO) Take 1 tablet by mouth daily.    colchicine  0.6 MG tablet Take 1 tablet (0.6 mg total) by mouth 2 (two) times daily for 5 days.    ezetimibe  (ZETIA ) 10 MG tablet Take 1 tablet (10 mg total) by mouth daily.    famotidine  (PEPCID ) 40 MG tablet Take 1 tablet (40 mg total) by mouth daily.    furosemide  (LASIX ) 20 MG tablet TAKE 1 TABLET BY MOUTH EVERY DAY    ibuprofen  (ADVIL ) 800 MG tablet TAKE 1 TABLET BY MOUTH EVERY 8 HOURS AS NEEDED    levothyroxine  (SYNTHROID ) 75 MCG tablet Take 75 mcg by mouth daily.    LORazepam  (ATIVAN ) 0.5 MG tablet Take 1 tablet (0.5 mg total) by mouth every 8 (eight) hours as needed. for anxiety    methocarbamol  (ROBAXIN ) 500 MG tablet TAKE 1 TABLET BY MOUTH AT BEDTIME.    metoprolol  succinate (TOPROL -XL) 50 MG 24 hr tablet Take 1.5 tablets (75 mg total) by mouth 2 (two) times daily. Take with or immediately following a meal.    Multiple Vitamins-Minerals (MULTIVITAMIN WITH MINERALS) tablet Take 1 tablet by mouth daily.    nitroGLYCERIN  (NITROSTAT ) 0.4 MG SL tablet Place 1 tablet (0.4 mg total) under the tongue every 5 (five) minutes as needed for chest pain.    potassium chloride  SA (KLOR-CON  M) 20 MEQ tablet Take 1 tablet (20 mEq total) by mouth daily. 04/13/2023: PRN   tirzepatide  (ZEPBOUND ) 7.5 MG/0.5ML Pen Inject 7.5 mg into the skin once a week.    Vitamin D , Ergocalciferol , (DRISDOL ) 1.25 MG (50000 UNIT) CAPS capsule Take 1 capsule (50,000 Units total) by mouth  every 7 (seven) days.    [DISCONTINUED] tirzepatide  (ZEPBOUND ) 5 MG/0.5ML Pen Inject 5 mg into the skin once a week.    No facility-administered encounter medications on file as of 09/27/2023.     Follow-Up   Return in about 4 weeks (around 10/25/2023) for For Weight Mangement with Dr. Francyne.SABRA She was informed of the importance of frequent follow up visits to maximize her success with intensive lifestyle modifications for her multiple health conditions.  Attestation Statement   Reviewed by clinician on day of visit: allergies, medications, problem list, medical history, surgical history, family history, social history, and previous encounter notes.     Lucas Francyne, MD

## 2023-09-27 NOTE — Assessment & Plan Note (Signed)
 Most recent A1c is now down to 5.7.  She is currently on Zepbound  for pharmacoprophylaxis and will continue medication along with nutritional and behavioral strategies for weight management.

## 2023-09-27 NOTE — Assessment & Plan Note (Signed)
 On CPAP with reported good compliance. Continue PAP therapy. Losing 15% or more of body weight may improve AHI.  Continue current weight management strategy inclusive of GLP-1

## 2023-10-04 ENCOUNTER — Ambulatory Visit (INDEPENDENT_AMBULATORY_CARE_PROVIDER_SITE_OTHER): Payer: Self-pay | Admitting: Internal Medicine

## 2023-10-04 ENCOUNTER — Encounter: Payer: Self-pay | Admitting: Internal Medicine

## 2023-10-04 VITALS — BP 124/76 | HR 75 | Ht 68.0 in | Wt 246.0 lb

## 2023-10-04 DIAGNOSIS — I480 Paroxysmal atrial fibrillation: Secondary | ICD-10-CM

## 2023-10-04 DIAGNOSIS — E042 Nontoxic multinodular goiter: Secondary | ICD-10-CM

## 2023-10-04 DIAGNOSIS — Z23 Encounter for immunization: Secondary | ICD-10-CM | POA: Diagnosis not present

## 2023-10-04 DIAGNOSIS — I10 Essential (primary) hypertension: Secondary | ICD-10-CM | POA: Diagnosis not present

## 2023-10-04 DIAGNOSIS — Z1211 Encounter for screening for malignant neoplasm of colon: Secondary | ICD-10-CM | POA: Diagnosis not present

## 2023-10-04 DIAGNOSIS — Z0000 Encounter for general adult medical examination without abnormal findings: Secondary | ICD-10-CM | POA: Diagnosis not present

## 2023-10-04 DIAGNOSIS — Z1231 Encounter for screening mammogram for malignant neoplasm of breast: Secondary | ICD-10-CM | POA: Diagnosis not present

## 2023-10-04 DIAGNOSIS — E782 Mixed hyperlipidemia: Secondary | ICD-10-CM | POA: Diagnosis not present

## 2023-10-04 DIAGNOSIS — R7303 Prediabetes: Secondary | ICD-10-CM

## 2023-10-04 DIAGNOSIS — K219 Gastro-esophageal reflux disease without esophagitis: Secondary | ICD-10-CM

## 2023-10-04 MED ORDER — FUROSEMIDE 20 MG PO TABS
20.0000 mg | ORAL_TABLET | Freq: Every day | ORAL | 0 refills | Status: DC | PRN
Start: 2023-10-04 — End: 2024-01-18

## 2023-10-04 MED ORDER — SIMVASTATIN 10 MG PO TABS: 10.0000 mg | ORAL_TABLET | Freq: Every day | ORAL | 1 refills | Status: DC

## 2023-10-04 MED ORDER — FAMOTIDINE 40 MG PO TABS: 40.0000 mg | ORAL_TABLET | Freq: Every day | ORAL | 1 refills | Status: AC

## 2023-10-04 NOTE — Assessment & Plan Note (Signed)
 Recent A1c stable at 5.7 Continue diet and weight management follow up

## 2023-10-04 NOTE — Assessment & Plan Note (Signed)
 Blood pressure is well controlled on amlodipine , metoprolol  and lasix . No medication side effects noted. Plan to continue current medications.

## 2023-10-04 NOTE — Patient Instructions (Signed)
 Call [facility] to schedule your mammogram at [phone].

## 2023-10-04 NOTE — Progress Notes (Addendum)
 Date:  10/04/2023   Name:  Heather Dudley   DOB:  Oct 29, 1960   MRN:  [id number]   Chief Complaint: Annual Exam Heather Dudley is a 63 y.o. female who presents today for her Complete Annual Exam. She feels well. She reports exercising. She reports she is sleeping fairly well. Breast complaints - none.  Health Maintenance  Topic Date Due   Flu Shot  05/23/2024*   COVID-19 Vaccine (3 - Pfizer risk series) 04/03/2025*   Breast Cancer Screening  12/27/2024   Colon Cancer Screening  01/12/2029   Pneumococcal Vaccine for age over 18  Completed   Hepatitis C Screening  Completed   HIV Screening  Completed   Zoster (Shingles) Vaccine  Completed   Hepatitis B Vaccine  Aged Out   HPV Vaccine  Aged Out   Meningitis B Vaccine  Aged Out   DTaP/Tdap/Td vaccine  Discontinued  *Topic was postponed. The date shown is not the original due date.    Hypertension This is a chronic problem. The problem is controlled. Pertinent negatives include no chest pain, headaches, palpitations or shortness of breath. Past treatments include beta blockers, calcium  channel blockers and diuretics. The current treatment provides significant improvement. Hypertensive end-organ damage includes CAD/MI. There is no history of kidney disease or CVA. Identifiable causes of hypertension include a thyroid  problem.  Hyperlipidemia This is a chronic problem. The problem is uncontrolled. Recent lipid tests were reviewed and are high. Pertinent negatives include no chest pain, myalgias or shortness of breath. Current antihyperlipidemic treatment includes ezetimibe . The current treatment provides mild improvement of lipids.  Thyroid  Problem Presents for follow-up visit. Patient reports no anxiety, constipation, diarrhea, fatigue or palpitations. The symptoms have been stable. Her past medical history is significant for hyperlipidemia.    Review of Systems  Constitutional:  Negative for fatigue and unexpected weight  change.  HENT:  Negative for trouble swallowing.   Eyes:  Negative for visual disturbance.  Respiratory:  Negative for cough, chest tightness, shortness of breath and wheezing.   Cardiovascular:  Negative for chest pain, palpitations and leg swelling.  Gastrointestinal:  Negative for abdominal pain, constipation and diarrhea.  Genitourinary:  Negative for difficulty urinating, hematuria and urgency.  Musculoskeletal:  Negative for arthralgias and myalgias.       Mild swelling of left arm  Neurological:  Negative for dizziness, weakness, light-headedness and headaches.  Psychiatric/Behavioral:  Negative for dysphoric mood. The patient is not nervous/anxious.      Lab Results  Component Value Date   NA 140 10/04/2023   K 4.4 10/04/2023   CO2 20 10/04/2023   GLUCOSE 83 10/04/2023   BUN 16 10/04/2023   CREATININE 0.93 10/04/2023   CALCIUM  9.5 10/04/2023   EGFR 70 10/04/2023   GFRNONAA >60 01/11/2023   Lab Results  Component Value Date   CHOL 259 (H) 08/24/2023   HDL 43 08/24/2023   LDLCALC 194 (H) 08/24/2023   TRIG 120 08/24/2023   CHOLHDL 4.7 (H) 02/01/2023   Lab Results  Component Value Date   TSH 1.040 10/04/2023   Lab Results  Component Value Date   HGBA1C 5.7 (H) 08/24/2023   Lab Results  Component Value Date   WBC 9.0 10/04/2023   HGB 12.4 10/04/2023   HCT 39.8 10/04/2023   MCV 88 10/04/2023   PLT 292 10/04/2023   Lab Results  Component Value Date   ALT 12 10/04/2023   AST 18 10/04/2023   ALKPHOS 98 10/04/2023  BILITOT 0.3 10/04/2023   Lab Results  Component Value Date   VD25OH 28.0 (L) 08/24/2023     Patient Active Problem List   Diagnosis Date Noted   History of colonic polyps 01/13/2024   Obesity, unspecified 08/24/2023   Drug-induced constipation 06/21/2023   Statin intolerance 01/18/2023   Polyp of colon 12/10/2022   Class 3 severe obesity with serious comorbidity and body mass index (BMI) of 40.0 to 44.9 in adult (HCC) 10/21/2022    Metabolic dysfunction-associated steatotic liver disease (MASLD) 10/21/2022   Gastroesophageal reflux disease 09/30/2022   Generalized anxiety disorder 05/05/2022   Paroxysmal atrial fibrillation (HCC) 03/29/2022   Acquired thrombophilia 03/18/2022   Chronic diastolic heart failure (HCC) 03/18/2022   Prediabetes 12/16/2021   CAD (coronary artery disease), native coronary artery 12/06/2021   Lymphedema of left arm 09/23/2021   Nevus of neck 09/23/2021   OSA on CPAP 07/12/2020   S/P total abdominal hysterectomy 04/03/2020   Multiple thyroid  nodules 08/23/2018   Mixed hyperlipidemia 07/06/2018   Microscopic hematuria 05/04/2018   Liver hemangioma 03/26/2017   Macroglossia 02/05/2016   Environmental and seasonal allergies 01/02/2016   Vitamin D  deficiency 04/19/2015   Cervical radiculopathy 04/17/2015   Essential hypertension 04/17/2015   History of partial thyroidectomy 04/17/2015   Lipoma of axilla 10/24/2014   History of left breast cancer 09/02/2010    Allergies  Allergen Reactions   Ace Inhibitors Swelling   Betamethasone Dipropionate Itching and Other (See Comments)   Penicillins Rash    Has patient had a PCN reaction causing immediate rash, facial/tongue/throat swelling, SOB or lightheadedness with hypotension: Yes Has patient had a PCN reaction causing severe rash involving mucus membranes or skin necrosis: No Has patient had a PCN reaction that required hospitalization: No Has patient had a PCN reaction occurring within the last 10 years: No If all of the above answers are NO, then may proceed with Cephalosporin use.    Past Surgical History:  Procedure Laterality Date   ABDOMINAL HYSTERECTOMY  2011   ovaries remain   ATRIAL FIBRILLATION ABLATION N/A 01/12/2022   Procedure: ATRIAL FIBRILLATION ABLATION;  Surgeon: Cindie Ole DASEN, MD;  Location: [facility];  Service: Cardiovascular;  Laterality: N/A;   ATRIAL FIBRILLATION ABLATION N/A 01/11/2023    Procedure: ATRIAL FIBRILLATION ABLATION;  Surgeon: Cindie Ole DASEN, MD;  Location: [facility];  Service: Cardiovascular;  Laterality: N/A;   birth mark removed     BREAST CYST ASPIRATION Right    negative 01/2010   BREAST EXCISIONAL BIOPSY  08/05/2010   left breast positive 07/2010   BREAST LUMPECTOMY  2012   left breast   CARDIAC CATHETERIZATION     COLONOSCOPY  07/29/2012   normal study.   COLONOSCOPY N/A 01/13/2024   Procedure: COLONOSCOPY;  Surgeon: Jinny Carmine, MD;  Location[facility] ENDOSCOPY;  Service: Endoscopy;  Laterality: N/A;   COLONOSCOPY WITH PROPOFOL  N/A 12/10/2022   Procedure: COLONOSCOPY WITH PROPOFOL ;  Surgeon: Jinny Carmine, MD;  Location: [facility] ENDOSCOPY;  Service: Endoscopy;  Laterality: N/A;   HEMOSTASIS CLIP PLACEMENT  12/10/2022   Procedure: HEMOSTASIS CLIP PLACEMENT;  Surgeon: Jinny Carmine, MD;  Location: [facility] ENDOSCOPY;  Service: Endoscopy;;   HOT HEMOSTASIS  12/10/2022   Procedure: HOT HEMOSTASIS (ARGON PLASMA COAGULATION/BICAP);  Surgeon: Jinny Carmine, MD;  Location: [facility] ENDOSCOPY;  Service: Endoscopy;;   LEFT HEART CATH AND CORONARY ANGIOGRAPHY N/A 12/08/2021   Procedure: LEFT HEART CATH AND CORONARY ANGIOGRAPHY;  Surgeon: Darron Deatrice LABOR, MD;  Location: [facility] INVASIVE  CV LAB;  Service: Cardiovascular;  Laterality: N/A;   POLYPECTOMY  12/10/2022   Procedure: POLYPECTOMY;  Surgeon: Jinny Carmine, MD;  Location: [facility] ENDOSCOPY;  Service: Endoscopy;;   robotic tonsillectomy Lingual tonsils Bilateral 06/08/2016   THYROID  SURGERY     partially removed   TUBAL LIGATION      Social History   Tobacco Use   Smoking status: Never   Smokeless tobacco: Never   Tobacco comments:    Never smoke 02/09/22  Vaping Use   Vaping status: Never Used  Substance Use Topics   Alcohol use: Yes    Comment: rarely   Drug use: Never     Medication list has been reviewed and updated.  Current Meds  Medication Sig   B Complex-Biotin-FA (B-COMPLEX PO) Take 1  tablet by mouth daily.   colchicine  0.6 MG tablet Take 1 tablet (0.6 mg total) by mouth 2 (two) times daily for 5 days.   ibuprofen  (ADVIL ) 800 MG tablet TAKE 1 TABLET BY MOUTH EVERY 8 HOURS AS NEEDED   levothyroxine  (SYNTHROID ) 75 MCG tablet Take 75 mcg by mouth daily.   LORazepam  (ATIVAN ) 0.5 MG tablet Take 1 tablet (0.5 mg total) by mouth every 8 (eight) hours as needed. for anxiety   methocarbamol  (ROBAXIN ) 500 MG tablet TAKE 1 TABLET BY MOUTH AT BEDTIME.   Multiple Vitamins-Minerals (MULTIVITAMIN WITH MINERALS) tablet Take 1 tablet by mouth daily.   nitroGLYCERIN  (NITROSTAT ) 0.4 MG SL tablet Place 1 tablet (0.4 mg total) under the tongue every 5 (five) minutes as needed for chest pain.   [DISCONTINUED] amLODipine  (NORVASC ) 10 MG tablet Take 1 tablet (10 mg total) by mouth daily as needed (For chest pain).   [DISCONTINUED] apixaban  (ELIQUIS ) 5 MG TABS tablet Take 1 tablet (5 mg total) by mouth 2 (two) times daily.   [DISCONTINUED] ezetimibe  (ZETIA ) 10 MG tablet Take 1 tablet (10 mg total) by mouth daily.   [DISCONTINUED] famotidine  (PEPCID ) 40 MG tablet Take 1 tablet (40 mg total) by mouth daily.   [DISCONTINUED] furosemide  (LASIX ) 20 MG tablet TAKE 1 TABLET BY MOUTH EVERY DAY   [DISCONTINUED] metoprolol  succinate (TOPROL -XL) 50 MG 24 hr tablet Take 1.5 tablets (75 mg total) by mouth 2 (two) times daily. Take with or immediately following a meal.   [DISCONTINUED] potassium chloride  SA (KLOR-CON  M) 20 MEQ tablet Take 1 tablet (20 mEq total) by mouth daily.   [DISCONTINUED] simvastatin  (ZOCOR ) 10 MG tablet Take 1 tablet (10 mg total) by mouth at bedtime.   [DISCONTINUED] tirzepatide  (ZEPBOUND ) 7.5 MG/0.5ML Pen Inject 7.5 mg into the skin once a week.   [DISCONTINUED] Vitamin D , Ergocalciferol , (DRISDOL ) 1.25 MG (50000 UNIT) CAPS capsule Take 1 capsule (50,000 Units total) by mouth every 7 (seven) days.       10/04/2023    8:38 AM 06/15/2023    3:40 PM 02/01/2023   11:02 AM 09/30/2022    9:57 AM   GAD 7 : Generalized Anxiety Score  Nervous, Anxious, on Edge 0 0 0 0  Control/stop worrying 0 0 0 0  Worry too much - different things 0 1 0 0  Trouble relaxing 0 0 1 1  Restless 0 0 1 1  Easily annoyed or irritable 0 1 0 1  Afraid - awful might happen 0 0 0 0  Total GAD 7 Score 0 2 2 3   Anxiety Difficulty Not difficult at all Not difficult at all Not difficult at all Not difficult at all  10/04/2023    8:38 AM 06/15/2023    3:39 PM 02/01/2023   11:02 AM  Depression screen PHQ 2/9  Decreased Interest 0 1 0  Down, Depressed, Hopeless 0 0 0  PHQ - 2 Score 0 1 0  Altered sleeping 0 2 3  Tired, decreased energy 0 2 2  Change in appetite 0 0 1  Feeling bad or failure about yourself  0 0 0  Trouble concentrating 0 1 1  Moving slowly or fidgety/restless 0 0 1  Suicidal thoughts 0 0 0  PHQ-9 Score 0  6  8   Difficult doing work/chores Not difficult at all Not difficult at all Not difficult at all     Data saved with a previous flowsheet row definition    BP Readings from Last 3 Encounters:  02/01/24 130/70  01/27/24 126/78  01/19/24 128/72    Physical Exam Vitals and nursing note reviewed.  Constitutional:      General: She is not in acute distress.    Appearance: She is well-developed.  HENT:     Head: Normocephalic and atraumatic.     Right Ear: Tympanic membrane and ear canal normal.     Left Ear: Tympanic membrane and ear canal normal.     Nose:     Right Sinus: No maxillary sinus tenderness.     Left Sinus: No maxillary sinus tenderness.  Eyes:     General: No scleral icterus.       Right eye: No discharge.        Left eye: No discharge.     Conjunctiva/sclera: Conjunctivae normal.  Neck:     Thyroid : No thyromegaly.     Vascular: No carotid bruit.  Cardiovascular:     Rate and Rhythm: Normal rate and regular rhythm.     Pulses: Normal pulses.     Heart sounds: Normal heart sounds.  Pulmonary:     Effort: Pulmonary effort is normal. No respiratory  distress.     Breath sounds: No wheezing.  Abdominal:     General: Bowel sounds are normal.     Palpations: Abdomen is soft.     Tenderness: There is no abdominal tenderness.  Musculoskeletal:     Left upper arm: Swelling (mild lymphedema) present.     Cervical back: Normal range of motion. No erythema.     Right lower leg: No edema.     Left lower leg: No edema.  Lymphadenopathy:     Cervical: No cervical adenopathy.  Skin:    General: Skin is warm and dry.     Findings: No rash.  Neurological:     Mental Status: She is alert and oriented to person, place, and time.     Cranial Nerves: No cranial nerve deficit.     Sensory: No sensory deficit.     Deep Tendon Reflexes: Reflexes are normal and symmetric.  Psychiatric:        Attention and Perception: Attention normal.        Mood and Affect: Mood normal.     Wt Readings from Last 3 Encounters:  02/01/24 240 lb (108.9 kg)  01/27/24 233 lb (105.7 kg)  01/19/24 238 lb 9.6 oz (108.2 kg)    BP 124/76   Pulse 75   Ht 5' 8 (1.727 m)   Wt 246 lb (111.6 kg)   LMP 04/01/2008   SpO2 99%   BMI 37.40 kg/m   Assessment and Plan:  Problem List Items Addressed This Visit  Unprioritized   Essential hypertension (Chronic)   Blood pressure is well controlled on amlodipine , metoprolol  and lasix . No medication side effects noted. Plan to continue current medications.       Relevant Orders   CBC with Differential/Platelet (Completed)   Urinalysis, Routine w reflex microscopic (Completed)   Comprehensive metabolic panel with GFR (Completed)   Mixed hyperlipidemia (Chronic)   LDL is  Lab Results  Component Value Date   LDLCALC 194 (H) 08/24/2023   Currently taking zetia   No medication side effects or other concerns. Recommended LDL goal is < 55. She reports intolerance to statins - no myalgia but generally malaise. She is willing to try another - Simvastatin  10 mg.  Recheck lipids lab visit in 3 months. Consider  Repatha if needed.       Relevant Orders   Lipid panel   Multiple thyroid  nodules (Chronic)   Relevant Orders   TSH + free T4 (Completed)   Lymphedema of left arm   Persistent swelling of the left arm since mastectomy. Uses lymphedema sleeves regularly which improve her discomfort.      Prediabetes (Chronic)   Recent A1c stable at 5.7 Continue diet and weight management follow up      Relevant Orders   Comprehensive metabolic panel with GFR (Completed)   Paroxysmal atrial fibrillation (HCC) (Chronic)   S/p repeat ablation 12/2022 Per Cardiology:  Had one palpitation episode, but overall maintaining sinus rhythm since ablation Wears watch to monitor for arrhythmia. Recommended keeping symptom diary Continue 75mg  toprol  BID at this time, can consider switching to dilt in future      Gastroesophageal reflux disease   Relevant Medications   famotidine  (PEPCID ) 40 MG tablet   Other Visit Diagnoses       Annual physical exam    -  Primary   up to date on screenings referred to MM and Colonoscopy Prevnar-20 today     Encounter for screening mammogram for breast cancer       Relevant Orders   MM 3D SCREENING MAMMOGRAM BILATERAL BREAST (Completed)     Colon cancer screening       Relevant Orders   Ambulatory referral to Gastroenterology     Encounter for immunization       Relevant Orders   Pneumococcal conjugate vaccine 20-valent (Completed)       No follow-ups on file.    Leita HILARIO Adie, MD [facility] [place]

## 2023-10-04 NOTE — Assessment & Plan Note (Signed)
 S/p repeat ablation 12/2022 Per Cardiology:  Had one palpitation episode, but overall maintaining sinus rhythm since ablation Wears watch to monitor for arrhythmia. Recommended keeping symptom diary Continue 75mg  toprol  BID at this time, can consider switching to dilt in future

## 2023-10-04 NOTE — Assessment & Plan Note (Addendum)
 LDL is  Lab Results  Component Value Date   LDLCALC 194 (H) 08/24/2023   Currently taking zetia   No medication side effects or other concerns. Recommended LDL goal is < 55. She reports intolerance to statins - no myalgia but generally malaise. She is willing to try another - Simvastatin  10 mg.  Recheck lipids lab visit in 3 months. Consider Repatha if needed.

## 2023-10-05 ENCOUNTER — Ambulatory Visit: Payer: Self-pay | Admitting: Internal Medicine

## 2023-10-05 LAB — URINALYSIS, ROUTINE W REFLEX MICROSCOPIC
Bilirubin, UA: NEGATIVE
Glucose, UA: NEGATIVE
Leukocytes,UA: NEGATIVE
Nitrite, UA: NEGATIVE
Specific Gravity, UA: 1.026 (ref 1.005–1.030)
Urobilinogen, Ur: 0.2 mg/dL (ref 0.2–1.0)
pH, UA: 6 (ref 5.0–7.5)

## 2023-10-05 LAB — COMPREHENSIVE METABOLIC PANEL WITH GFR
ALT: 12 IU/L (ref 0–32)
AST: 18 IU/L (ref 0–40)
Albumin: 4.2 g/dL (ref 3.9–4.9)
Alkaline Phosphatase: 98 IU/L (ref 44–121)
BUN/Creatinine Ratio: 17 (ref 12–28)
BUN: 16 mg/dL (ref 8–27)
Bilirubin Total: 0.3 mg/dL (ref 0.0–1.2)
CO2: 20 mmol/L (ref 20–29)
Calcium: 9.5 mg/dL (ref 8.7–10.3)
Chloride: 103 mmol/L (ref 96–106)
Creatinine, Ser: 0.93 mg/dL (ref 0.57–1.00)
Globulin, Total: 2.7 g/dL (ref 1.5–4.5)
Glucose: 83 mg/dL (ref 70–99)
Potassium: 4.4 mmol/L (ref 3.5–5.2)
Sodium: 140 mmol/L (ref 134–144)
Total Protein: 6.9 g/dL (ref 6.0–8.5)
eGFR: 70 mL/min/1.73 (ref >59)

## 2023-10-05 LAB — CBC WITH DIFFERENTIAL/PLATELET
Basophils Absolute: 0.1 x10E3/uL (ref 0.0–0.2)
Basos: 1 %
EOS (ABSOLUTE): 0.5 x10E3/uL — ABNORMAL HIGH (ref 0.0–0.4)
Eos: 5 %
Hematocrit: 39.8 % (ref 34.0–46.6)
Hemoglobin: 12.4 g/dL (ref 11.1–15.9)
Immature Grans (Abs): 0 x10E3/uL (ref 0.0–0.1)
Immature Granulocytes: 0 %
Lymphocytes Absolute: 2.6 x10E3/uL (ref 0.7–3.1)
Lymphs: 29 %
MCH: 27.3 pg (ref 26.6–33.0)
MCHC: 31.2 g/dL — ABNORMAL LOW (ref 31.5–35.7)
MCV: 88 fL (ref 79–97)
Monocytes Absolute: 0.5 x10E3/uL (ref 0.1–0.9)
Monocytes: 5 %
Neutrophils Absolute: 5.4 x10E3/uL (ref 1.4–7.0)
Neutrophils: 60 %
Platelets: 292 x10E3/uL (ref 150–450)
RBC: 4.55 x10E6/uL (ref 3.77–5.28)
RDW: 13.9 % (ref 11.7–15.4)
WBC: 9 x10E3/uL (ref 3.4–10.8)

## 2023-10-05 LAB — MICROSCOPIC EXAMINATION
Casts: NONE SEEN /LPF
Epithelial Cells (non renal): >10 /HPF — AB (ref 0–10)

## 2023-10-05 LAB — TSH+FREE T4
Free T4: 0.95 ng/dL (ref 0.82–1.77)
TSH: 1.04 u[IU]/mL (ref 0.450–4.500)

## 2023-10-06 ENCOUNTER — Telehealth: Payer: Self-pay

## 2023-10-06 ENCOUNTER — Other Ambulatory Visit: Payer: Self-pay

## 2023-10-06 DIAGNOSIS — Z860100 Personal history of colon polyps, unspecified: Secondary | ICD-10-CM

## 2023-10-06 MED ORDER — NA SULFATE-K SULFATE-MG SULF 17.5-3.13-1.6 GM/177ML PO SOLN
1.0000 | Freq: Once | ORAL | 0 refills | Status: AC
Start: 2023-10-06 — End: 2023-10-06

## 2023-10-06 NOTE — Telephone Encounter (Signed)
 Pharmacy please advise on holding Eliquis  prior to colonoscopy scheduled for 01/13/2024. Last labs 10/04/2023. Thank you.

## 2023-10-06 NOTE — Telephone Encounter (Signed)
   Pre-operative Risk Assessment    Patient Name: Heather Dudley  DOB: [birth year] MRN: [id number]   Date of last office visit: 04/13/23 CHANTAL NEEDLE, NP Date of next office visit: NONE   Request for Surgical Clearance    Procedure:  COLONOSCOPY  Date of Surgery:  Clearance 01/13/24                                Surgeon:  DR JINNY Surgeon's Group or Practice Name:  [facility] Phone number:  [phone] Fax number:  [phone]   Type of Clearance Requested:   - Medical  - Pharmacy:  Hold Apixaban  (Eliquis )     Type of Anesthesia:  General    Additional requests/questions:    SignedLucie DELENA Ku   10/06/2023, 11:47 AM

## 2023-10-06 NOTE — Telephone Encounter (Signed)
 Gastroenterology Pre-Procedure Review  Request Date: 01/13/24 Requesting Physician: Dr. Jinny  PATIENT REVIEW QUESTIONS: The patient responded to the following health history questions as indicated:    1. Are you having any GI issues? no 2. Do you have a personal history of Polyps? yes (last colonoscopy 12/10/22 recommended repeat in 1 year) 3. Do you have a family history of Colon Cancer or Polyps? no 4. Diabetes Mellitus? Prediabetes takes zepbound  has been advised to stop 7 days prior and noted on instructions 5. Joint replacements in the past 12 months?no 6. Major health problems in the past 3 months?no 7. Any artificial heart valves, MVP, or defibrillator?cardiac history clearance sent to [facility]    MEDICATIONS & ALLERGIES:    Patient reports the following regarding taking any anticoagulation/antiplatelet therapy:   Plavix, Coumadin, Eliquis , Xarelto , Lovenox, Pradaxa, Brilinta, or Effient? yes (Eliquis  advice noted on cardiac clearance) Aspirin ? no  Patient confirms/reports the following medications:  Current Outpatient Medications  Medication Sig Dispense Refill   Na Sulfate-K Sulfate-Mg Sulfate concentrate (SUPREP) 17.5-3.13-1.6 GM/177ML SOLN Take 1 kit (354 mLs total) by mouth once for 1 dose. 354 mL 0   amLODipine  (NORVASC ) 10 MG tablet Take 1 tablet (10 mg total) by mouth daily as needed (For chest pain). 90 tablet 1   apixaban  (ELIQUIS ) 5 MG TABS tablet Take 1 tablet (5 mg total) by mouth 2 (two) times daily. 28 tablet 0   B Complex-Biotin-FA (B-COMPLEX PO) Take 1 tablet by mouth daily.     colchicine  0.6 MG tablet Take 1 tablet (0.6 mg total) by mouth 2 (two) times daily for 5 days. 10 tablet 0   ezetimibe  (ZETIA ) 10 MG tablet Take 1 tablet (10 mg total) by mouth daily. 90 tablet 3   famotidine  (PEPCID ) 40 MG tablet Take 1 tablet (40 mg total) by mouth daily. 90 tablet 1   furosemide  (LASIX ) 20 MG tablet Take 1 tablet (20 mg total) by mouth daily as needed. 30 tablet  0   ibuprofen  (ADVIL ) 800 MG tablet TAKE 1 TABLET BY MOUTH EVERY 8 HOURS AS NEEDED 100 tablet 0   levothyroxine  (SYNTHROID ) 75 MCG tablet Take 75 mcg by mouth daily.     LORazepam  (ATIVAN ) 0.5 MG tablet Take 1 tablet (0.5 mg total) by mouth every 8 (eight) hours as needed. for anxiety 20 tablet 0   methocarbamol  (ROBAXIN ) 500 MG tablet TAKE 1 TABLET BY MOUTH AT BEDTIME. 30 tablet 0   metoprolol  succinate (TOPROL -XL) 50 MG 24 hr tablet Take 1.5 tablets (75 mg total) by mouth 2 (two) times daily. Take with or immediately following a meal. 270 tablet 3   Multiple Vitamins-Minerals (MULTIVITAMIN WITH MINERALS) tablet Take 1 tablet by mouth daily.     nitroGLYCERIN  (NITROSTAT ) 0.4 MG SL tablet Place 1 tablet (0.4 mg total) under the tongue every 5 (five) minutes as needed for chest pain. 30 tablet 0   potassium chloride  SA (KLOR-CON  M) 20 MEQ tablet Take 1 tablet (20 mEq total) by mouth daily. 90 tablet 3   simvastatin  (ZOCOR ) 10 MG tablet Take 1 tablet (10 mg total) by mouth at bedtime. 90 tablet 1   tirzepatide  (ZEPBOUND ) 7.5 MG/0.5ML Pen Inject 7.5 mg into the skin once a week. 2 mL 0   Vitamin D , Ergocalciferol , (DRISDOL ) 1.25 MG (50000 UNIT) CAPS capsule Take 1 capsule (50,000 Units total) by mouth every 7 (seven) days. 16 capsule 0   No current facility-administered medications for this visit.    Patient confirms/reports the  following allergies:  Allergies  Allergen Reactions   Ace Inhibitors Swelling   Sernivo [Betamethasone Dipropionate] Itching   Penicillins Rash    Has patient had a PCN reaction causing immediate rash, facial/tongue/throat swelling, SOB or lightheadedness with hypotension: Yes Has patient had a PCN reaction causing severe rash involving mucus membranes or skin necrosis: No Has patient had a PCN reaction that required hospitalization: No Has patient had a PCN reaction occurring within the last 10 years: No If all of the above answers are NO, then may proceed with  Cephalosporin use.    No orders of the defined types were placed in this encounter.   AUTHORIZATION INFORMATION Primary Insurance: 1D#: Group #:  Secondary Insurance: 1D#: Group #:  SCHEDULE INFORMATION: Date: 01/13/24 Time: Location: [facility]

## 2023-10-11 IMAGING — MG DIGITAL DIAGNOSTIC BILAT W/ TOMO W/ CAD
6 of 10 series · 6 of 30 positions shown · non-contrast
Comparison: Previous exam(s).

CLINICAL DATA: 61-year-old female presenting for short-term
follow-up of a probably benign right breast mass. Patient has a
remote history of left breast cancer.

EXAM:
DIGITAL DIAGNOSTIC BILATERAL MAMMOGRAM WITH TOMOSYNTHESIS AND CAD;
ULTRASOUND RIGHT BREAST LIMITED
TECHNIQUE: Bilateral digital diagnostic mammography and breast tomosynthesis
was performed. The images were evaluated with computer-aided
detection.; Targeted ultrasound examination of the right breast was
performed

[L MLO synth-2D (1 of 2)]
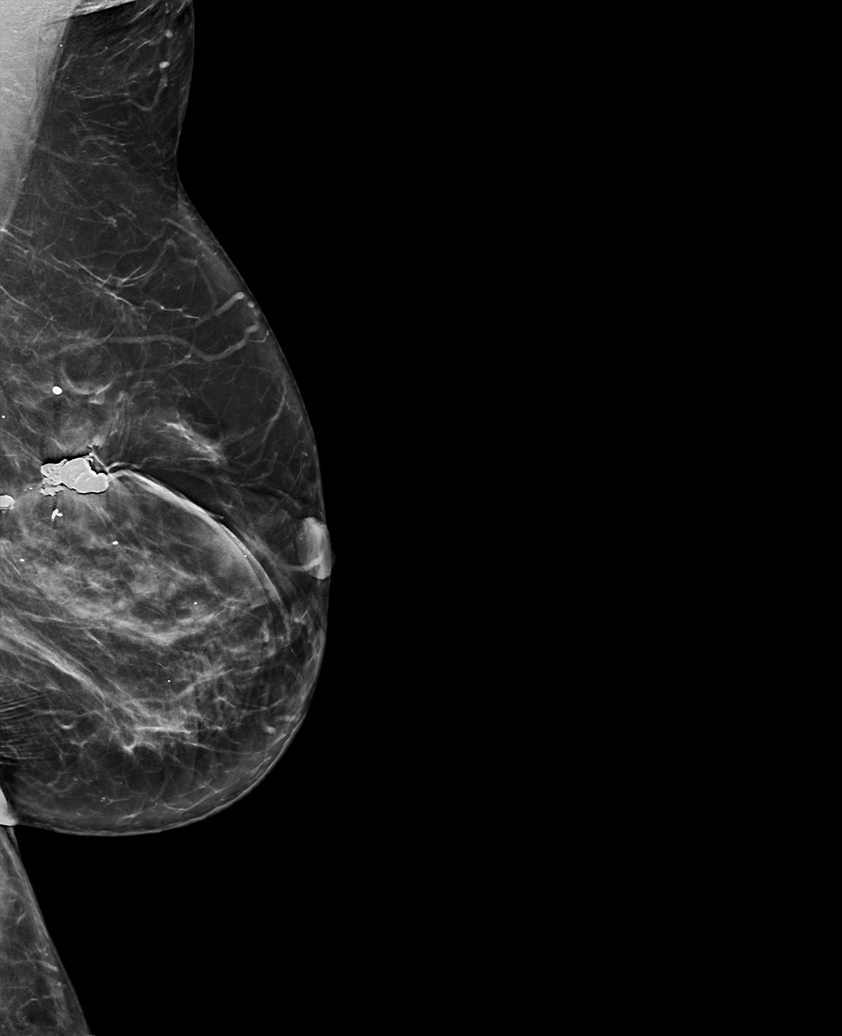

[L MLO synth-2D (2 of 2)]
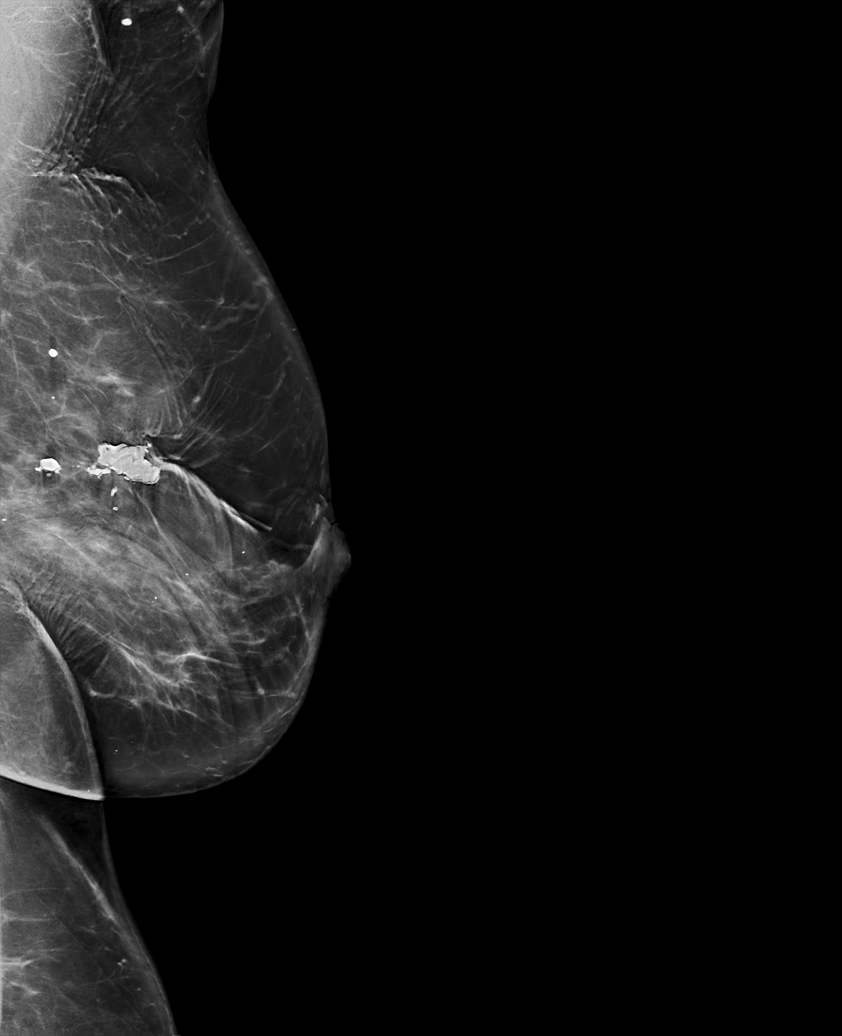

[R CC synth-2D]
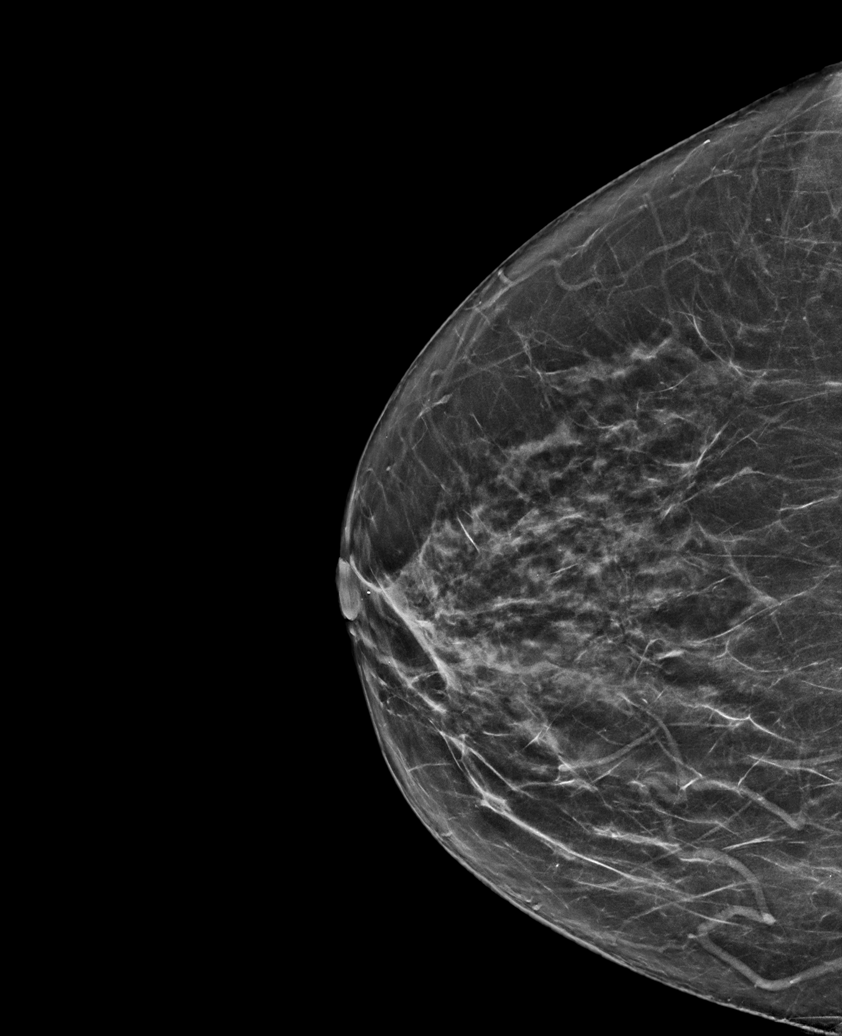

[R MLO synth-2D]
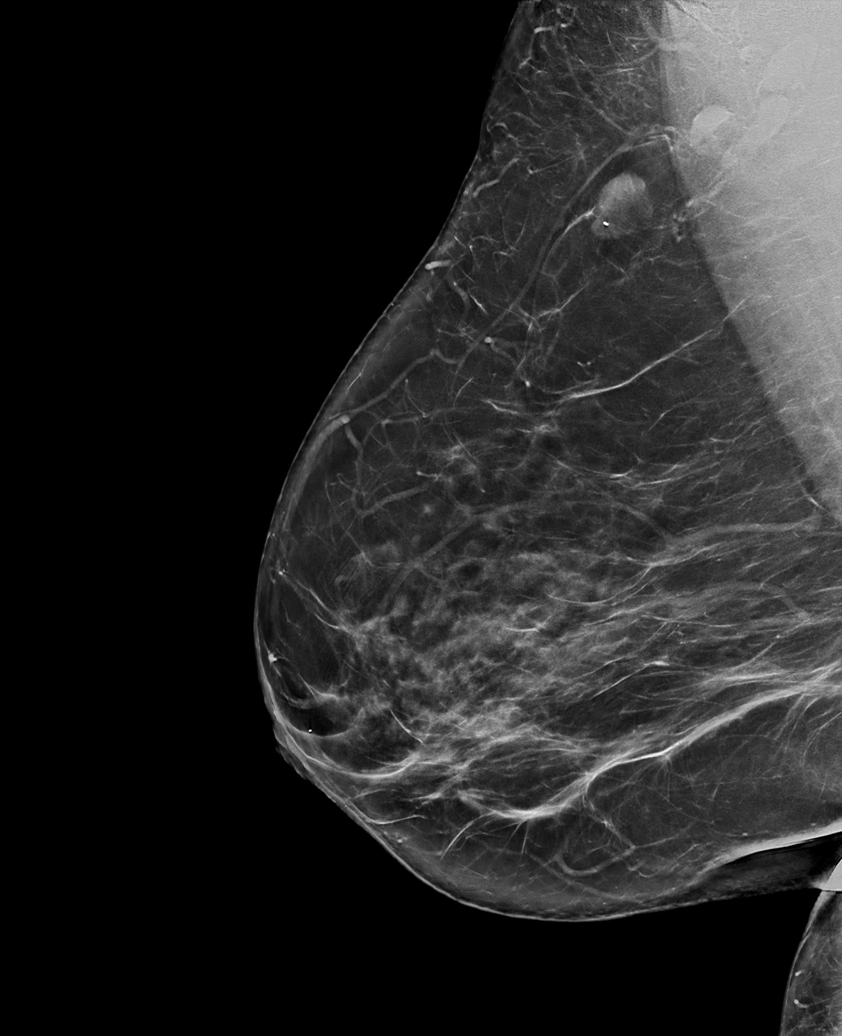

[L CC synth-2D]
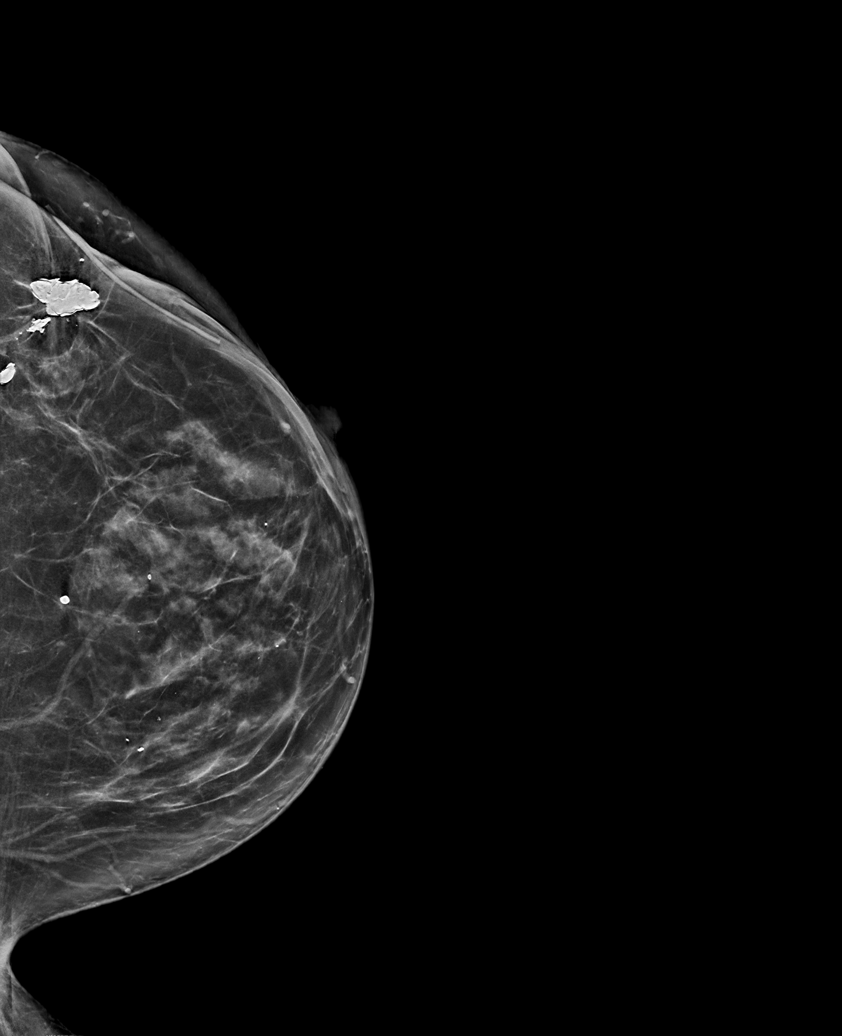

[R CC tomo · tomo slice 37/72.0]
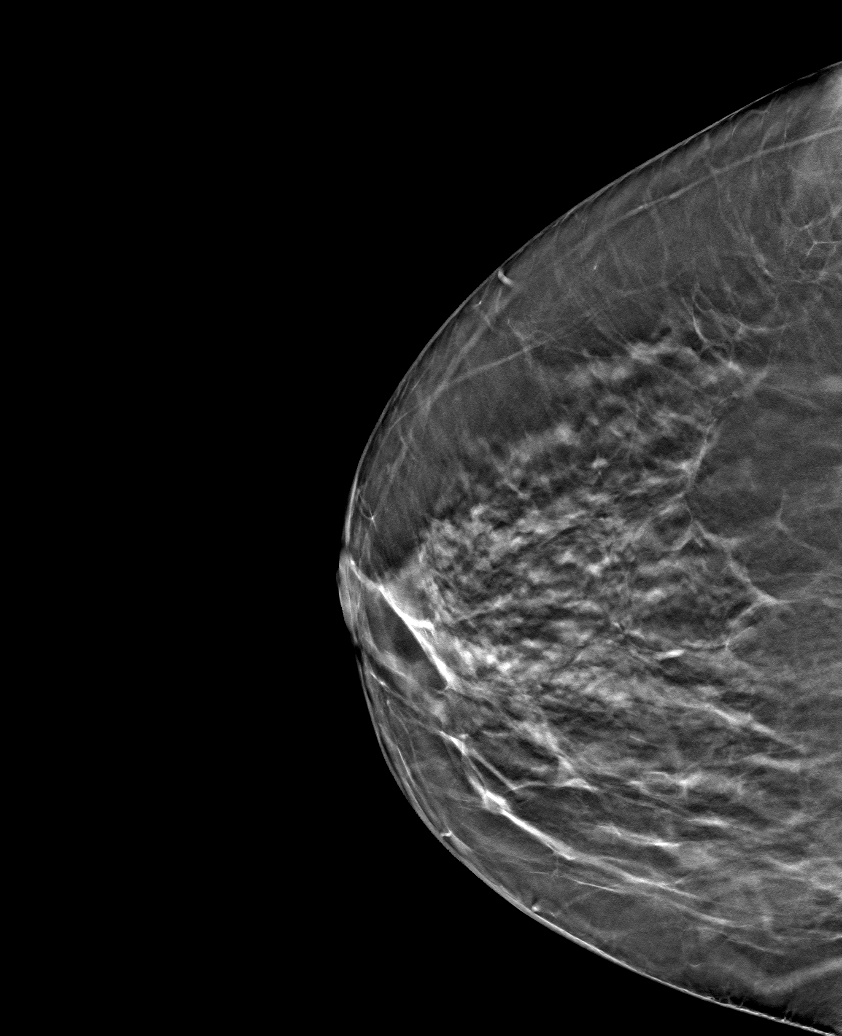

[6 of 30 positions shown; findings below may reference images not displayed]

ACR Breast Density Category c: The breast tissue is heterogeneously
dense, which may obscure small masses.
FINDINGS: Mammogram:

Right breast: The small mass originally seen in the outer right
breast is no longer definitely visualized. There are no new
suspicious findings in the right breast to suggest malignancy.

Left breast: Stable postsurgical changes. No suspicious mass,
distortion, or microcalcifications are identified to suggest
presence of malignancy.

Ultrasound:

Targeted ultrasound is performed in the right breast at 9:30 o'clock
6 cm from the nipple demonstrating an oval circumscribed hypoechoic
mass measuring 0.3 x 0.1 x 0.3 cm, previously measuring 0.3 x 0.3 x
0.4 cm.
IMPRESSION: 1. Benign small mass in the right breast at 9:30 o'clock which has
decreased in size over time.

2.  No mammographic evidence of malignancy bilaterally.

RECOMMENDATION:
Screening mammogram in one year.(Code:0T-[id number])

I have discussed the findings and recommendations with the patient.
If applicable, a reminder letter will be sent to the patient
regarding the next appointment.

BI-RADS CATEGORY  2: Benign.

## 2023-10-15 ENCOUNTER — Telehealth: Payer: Self-pay

## 2023-10-15 NOTE — Telephone Encounter (Signed)
 Preop tele appt now scheduled, med rec and consent done.

## 2023-10-15 NOTE — Telephone Encounter (Signed)
 Patient with diagnosis of atrial fibrillation on Eliquis  for anticoagulation.    Procedure:  COLONOSCOPY   Date of Surgery:  Clearance 01/13/24   CHA2DS2-VASc Score = 3   This indicates a 3.2% annual risk of stroke. The patient's score is based upon: CHF History: 0 HTN History: 1 Diabetes History: 0 Stroke History: 0 Vascular Disease History: 1 Age Score: 0 Gender Score: 1    CrCl 83 (with adjusted body weight) Platelet count 292  Patient has not had an Afib/aflutter ablation within the last 3 months or DCCV within the last 30 days  Per office protocol, patient can hold Eliquis  for 2 days prior to procedure.   Patient will not need bridging with Lovenox (enoxaparin) around procedure.  **This guidance is not considered finalized until pre-operative APP has relayed final recommendations.**

## 2023-10-15 NOTE — Telephone Encounter (Signed)
   Name: Heather Dudley  DOB: [birth year]  MRN: [id number]  Primary Cardiologist: Evalene Lunger, MD   Preoperative team, please contact this patient and set up a phone call appointment for further preoperative risk assessment. Please obtain consent and complete medication review. Thank you for your help.  I confirm that guidance regarding antiplatelet and oral anticoagulation therapy has been completed and, if necessary, noted below.  Per Pharmacy 10/15/2023 Procedure:  COLONOSCOPY   Date of Surgery:  Clearance 01/13/24     CHA2DS2-VASc Score = 3   This indicates a 3.2% annual risk of stroke. The patient's score is based upon: CHF History: 0 HTN History: 1 Diabetes History: 0 Stroke History: 0 Vascular Disease History: 1 Age Score: 0 Gender Score: 1     CrCl 83 (with adjusted body weight) Platelet count 292   Patient has not had an Afib/aflutter ablation within the last 3 months or DCCV within the last 30 days   Per office protocol, patient can hold Eliquis  for 2 days prior to procedure.   Patient will not need bridging with Lovenox (enoxaparin) around procedure.  I also confirmed the patient resides in the state of [place] . As per [facility] Medical Board telemedicine laws, the patient must reside in the state in which the provider is licensed.   Lamarr Satterfield, NP 10/15/2023, 1:46 PM [facility]

## 2023-10-15 NOTE — Telephone Encounter (Signed)
  Patient Consent for Virtual Visit        Heather Dudley has provided verbal consent on 10/15/2023 for a virtual visit (video or telephone).   CONSENT FOR VIRTUAL VISIT FOR:  Heather Dudley  By participating in this virtual visit I agree to the following:  I hereby voluntarily request, consent and authorize [facility] and its employed or contracted physicians, physician assistants, nurse practitioners or other licensed health care professionals (the Practitioner), to provide me with telemedicine health care services (the "Services) as deemed necessary by the treating Practitioner. I acknowledge and consent to receive the Services by the Practitioner via telemedicine. I understand that the telemedicine visit will involve communicating with the Practitioner through live audiovisual communication technology and the disclosure of certain medical information by electronic transmission. I acknowledge that I have been given the opportunity to request an in-person assessment or other available alternative prior to the telemedicine visit and am voluntarily participating in the telemedicine visit.  I understand that I have the right to withhold or withdraw my consent to the use of telemedicine in the course of my care at any time, without affecting my right to future care or treatment, and that the Practitioner or I may terminate the telemedicine visit at any time. I understand that I have the right to inspect all information obtained and/or recorded in the course of the telemedicine visit and may receive copies of available information for a reasonable fee.  I understand that some of the potential risks of receiving the Services via telemedicine include:  Delay or interruption in medical evaluation due to technological equipment failure or disruption; Information transmitted may not be sufficient (e.g. poor resolution of images) to allow for appropriate medical decision making by the  Practitioner; and/or  In rare instances, security protocols could fail, causing a breach of personal health information.  Furthermore, I acknowledge that it is my responsibility to provide information about my medical history, conditions and care that is complete and accurate to the best of my ability. I acknowledge that Practitioner's advice, recommendations, and/or decision may be based on factors not within their control, such as incomplete or inaccurate data provided by me or distortions of diagnostic images or specimens that may result from electronic transmissions. I understand that the practice of medicine is not an exact science and that Practitioner makes no warranties or guarantees regarding treatment outcomes. I acknowledge that a copy of this consent can be made available to me via my patient portal [facility] MyChart), or I can request a printed copy by calling the office of [facility].    I understand that my insurance will be billed for this visit.   I have read or had this consent read to me. I understand the contents of this consent, which adequately explains the benefits and risks of the Services being provided via telemedicine.  I have been provided ample opportunity to ask questions regarding this consent and the Services and have had my questions answered to my satisfaction. I give my informed consent for the services to be provided through the use of telemedicine in my medical care

## 2023-10-21 ENCOUNTER — Telehealth: Payer: Self-pay | Admitting: Occupational Therapy

## 2023-10-21 NOTE — Telephone Encounter (Signed)
 Heather Dudley, this patient left a messaage that she would like to speak with you about another compression hose for her lymphedema. She was evaluated by you two years ago.

## 2023-10-28 ENCOUNTER — Ambulatory Visit (INDEPENDENT_AMBULATORY_CARE_PROVIDER_SITE_OTHER): Admitting: Internal Medicine

## 2023-10-28 ENCOUNTER — Encounter (INDEPENDENT_AMBULATORY_CARE_PROVIDER_SITE_OTHER): Payer: Self-pay | Admitting: Internal Medicine

## 2023-10-28 VITALS — BP 138/83 | HR 82 | Temp 98.2°F | Ht 68.0 in | Wt 241.0 lb

## 2023-10-28 DIAGNOSIS — I10 Essential (primary) hypertension: Secondary | ICD-10-CM | POA: Diagnosis not present

## 2023-10-28 DIAGNOSIS — K760 Fatty (change of) liver, not elsewhere classified: Secondary | ICD-10-CM | POA: Diagnosis not present

## 2023-10-28 DIAGNOSIS — R7303 Prediabetes: Secondary | ICD-10-CM

## 2023-10-28 DIAGNOSIS — Z6836 Body mass index (BMI) 36.0-36.9, adult: Secondary | ICD-10-CM

## 2023-10-28 DIAGNOSIS — K5903 Drug induced constipation: Secondary | ICD-10-CM | POA: Diagnosis not present

## 2023-10-28 DIAGNOSIS — G4733 Obstructive sleep apnea (adult) (pediatric): Secondary | ICD-10-CM | POA: Diagnosis not present

## 2023-10-28 DIAGNOSIS — Z6841 Body Mass Index (BMI) 40.0 and over, adult: Secondary | ICD-10-CM

## 2023-10-28 DIAGNOSIS — R110 Nausea: Secondary | ICD-10-CM

## 2023-10-28 DIAGNOSIS — E66812 Obesity, class 2: Secondary | ICD-10-CM

## 2023-10-28 MED ORDER — ONDANSETRON HCL 4 MG PO TABS: 4.0000 mg | ORAL_TABLET | Freq: Three times a day (TID) | ORAL | 0 refills | Status: AC | PRN

## 2023-10-28 MED ORDER — ZEPBOUND 7.5 MG/0.5ML ~~LOC~~ SOAJ
7.5000 mg | SUBCUTANEOUS | 0 refills | Status: DC
Start: 2023-10-28 — End: 2023-11-08

## 2023-10-28 NOTE — Progress Notes (Signed)
 Office: [phone]  /  Fax: [phone]  Weight Summary and Body Composition Analysis (BIA)  Vitals Temp: 98.2 F (36.8 C) BP: 138/83 Pulse Rate: 82 SpO2: 97 %   Anthropometric Measurements Height: 5' 8 (1.727 m) Weight: 241 lb (109.3 kg) BMI (Calculated): 36.65 Weight at Last Visit: 243 lb Weight Lost Since Last Visit: 2 lb Weight Gained Since Last Visit: 0 lb Starting Weight: 263 lb Total Weight Loss (lbs): 22 lb (9.979 kg) Peak Weight: 264 lb   Body Composition  Body Fat %: 39.3 % Fat Mass (lbs): 94.8 lbs Muscle Mass (lbs): 139 lbs Total Body Water (lbs): 90.2 lbs Visceral Fat Rating : 12    RMR: [id number]  Today's Visit #: no  Starting Date: 10/01/22   Subjective   Chief Complaint: Obesity  Interval History Discussed the use of AI scribe software for clinical note transcription with the patient, who gave verbal consent to proceed.  History of Present Illness Heather Dudley is a 63 year old female with hypertension, sleep apnea, and prediabetes who presents for medical weight management.  Heather Dudley has lost two pounds since her last visit and is adhering to a 1500 calorie nutrition plan about 50% of the time. Heather Dudley tracks her intake, consumes more whole foods, and drinks the recommended amount of water. Occasionally, Heather Dudley skips meals. Her physical activity includes exercising four days a week for 30 minutes, primarily walking.  Heather Dudley is currently on a 7.5 mg dose of Zepbound , which has increased nausea compared to the previous 5 mg dose. The nausea is tolerable, and Heather Dudley is adjusting to it. Heather Dudley has been on this medication since January of this year.  Her highest recorded weight was 272 pounds in 2023. Since starting the weight management program in August, Heather Dudley has lost 26 pounds.  Heather Dudley has a history of hypertension, sleep apnea managed with CPAP, and prediabetes. Heather Dudley is also on simvastatin  for cholesterol management, which Heather Dudley finds uncomfortable to take at  night due to its impact on her sleep. Heather Dudley experiences constipation but manages it with a chlorophyll-containing green supplement instead of Miralax . Heather Dudley prefers cooked kale or cabbage over raw salads due to stomach discomfort.  Her dietary changes include switching from allulose to brown sugar and Splenda due to running out of allulose. Heather Dudley notes that allulose was gentle and not very sweet, and Heather Dudley prefers it over other sweeteners.     Challenges affecting patient progress: lack of strengthening exercise and menopause.    Pharmacotherapy for weight management: Heather Dudley is currently taking Zepbound  with adequate clinical response  and experiencing the following side effects: nausea.   Assessment and Plan   Treatment Plan For Obesity:  Recommended Dietary Goals  Heather Dudley is currently in the action stage of change. As such, her goal is to continue weight management plan. Heather Dudley has agreed to: continue current plan  Behavioral Health and Counseling  We discussed the following behavioral modification strategies today: continue to work on maintaining a reduced calorie state, getting the recommended amount of protein, incorporating whole foods, making healthy choices, staying well hydrated and practicing mindfulness when eating. and increase protein intake, fibrous foods (25 grams per day for women, 30 grams for men) and water to improve satiety and decrease hunger signals. .  Additional education and resources provided today: None  Recommended Physical Activity Goals  Heather Dudley has been advised to work up to 150 minutes of moderate intensity aerobic activity a week and strengthening exercises 2-3 times per week for cardiovascular health,  weight loss maintenance and preservation of muscle mass.  Heather Dudley has agreed to :  Continue to gradually increase the amount and intensity of exercise routine, Increase volume of physical activity to a goal of 240 minutes a week, and Combine aerobic and strengthening  exercises for efficiency and improved cardiometabolic health.  Medical Interventions and Pharmacotherapy  We discussed various medication options to help Heather Dudley with her weight loss efforts and we both agreed to : Adequate clinical response to anti-obesity medication, continue current regimen and do not recommend further increases in GLP-1 due to gastrointestinal side effects  Associated Conditions Impacted by Obesity Treatment  Assessment & Plan Essential hypertension   Blood pressure close to goal, target of less then 130 / 80 mmHg. Currently on amlodipine  and metoprolol . Weight loss and lifestyle modifications are expected to further improve blood pressure control. - Continue amlodipine  and metoprolol  as prescribed - Continue Zepbound  at current dose OSA on CPAP On CPAP with reported good compliance. Continue PAP therapy. Losing 15% or more of body weight may improve AHI.  Continue current weight management strategy inclusive of GLP-1 Drug-induced constipation Improved using as necessary MiraLAX . Metabolic dysfunction-associated steatotic liver disease (MASLD) Detected on ultrasound in 2013.  Most recent liver enzymes are within normal limits.   Fibrosis 4 Score = 1.11 (Low risk)        Interpretation for patients with NAFLD          <1.30       -  F0-F1 (Low risk)          1.30-2.67 -  Indeterminate           >2.67      -  F3-F4 (High risk)     Validated for ages 72-65   No further work-up. Losing 15% may improve conditions also avoiding alcohol and reducing simple and processed carbs.  Continue current weight management strategy inclusive of GLP-1.  Class 2 severe obesity with serious comorbidity and body mass index (BMI) of 36.0 to 36.9 in adult, unspecified obesity type (HCC) Weight: decrease of 26 lb (9.7%) over 1 year, 1 month  Start: 09/30/2022 267 lb (121.1 kg)  End: 10/28/2023 241 lb (109.3 kg)  Class 3 obesity with significant weight loss of 26 pounds, approximately 10% of  body weight, since starting the program. Currently on Zepbound  7.5 mg, showing a 31% reduction from highest recorded weight, equating to 11.7% of total body weight. Current dose expected to yield 15% weight loss, with potential for 20-22% at higher doses. - Continue Zepbound  7.5 mg for four weeks to assess tolerance and effectiveness. - Encourage 1500 calorie nutrition plan emphasizing whole foods, high fiber, lean protein, and healthy fats. - Encourage 240 minutes of weekly physical activity, including cardio and strength training. - Discuss exercise's role in boosting metabolism and maintaining weight loss. - Provide education on compatible foods with GLP-1 medications to minimize gastrointestinal side effects. Prediabetes Most recent A1c is now down to 5.7.  Heather Dudley is currently on Zepbound  for pharmacoprophylaxis and will continue medication along with nutritional and behavioral strategies for weight management. Nausea Nausea associated with Zepbound  7.5 mg, possibly related to dietary choices rather than medication. Foods high in fat, sugar, and spice may exacerbate symptoms due to medication's effect on gastric emptying. - Continue Zepbound  7.5 mg and monitor for tolerance. - Prescribe Zofran  for nausea as needed. - Educate on dietary modifications to reduce nausea, such as avoiding fatty, greasy, sugary, and spicy foods.  Objective   Physical Exam:  Blood pressure 138/83, pulse 82, temperature 98.2 F (36.8 C), height 5' 8 (1.727 m), weight 241 lb (109.3 kg), last menstrual period 04/01/2008, SpO2 97%. Body mass index is 36.64 kg/m.  General: Heather Dudley is overweight, cooperative, alert, well developed, and in no acute distress. PSYCH: Has normal mood, affect and thought process.   HEENT: EOMI, sclerae are anicteric. Lungs: Normal breathing effort, no conversational dyspnea. Extremities: No edema.  Neurologic: No gross sensory or motor deficits. No tremors or fasciculations noted.     Diagnostic Data Reviewed:  BMET    Component Value Date/Time   NA 140 10/04/2023 0921   NA 137 02/22/2012 1443   K 4.4 10/04/2023 0921   K 3.6 09/19/2012 1011   CL 103 10/04/2023 0921   CL 101 02/22/2012 1443   CO2 20 10/04/2023 0921   CO2 27 02/22/2012 1443   GLUCOSE 83 10/04/2023 0921   GLUCOSE 106 (H) 01/11/2023 0836   GLUCOSE 102 (H) 02/22/2012 1443   BUN 16 10/04/2023 0921   BUN 13 02/22/2012 1443   CREATININE 0.93 10/04/2023 0921   CREATININE 1.09 11/02/2013 0903   CALCIUM  9.5 10/04/2023 0921   CALCIUM  9.0 02/22/2012 1443   GFRNONAA >60 01/11/2023 0836   GFRNONAA 59 (L) 11/02/2013 0903   GFRAA 68 07/11/2019 1143   GFRAA >60 11/02/2013 0903   Lab Results  Component Value Date   HGBA1C 5.7 (H) 08/24/2023   HGBA1C 5.8 (H) 07/11/2019   Lab Results  Component Value Date   INSULIN  19.6 10/21/2022   Lab Results  Component Value Date   TSH 1.040 10/04/2023   CBC    Component Value Date/Time   WBC 9.0 10/04/2023 0921   WBC 8.3 01/11/2023 0836   RBC 4.55 10/04/2023 0921   RBC 4.58 01/11/2023 0836   HGB 12.4 10/04/2023 0921   HCT 39.8 10/04/2023 0921   PLT 292 10/04/2023 0921   MCV 88 10/04/2023 0921   MCV 87 11/02/2013 0903   MCH 27.3 10/04/2023 0921   MCH 27.3 01/11/2023 0836   MCHC 31.2 (L) 10/04/2023 0921   MCHC 31.3 01/11/2023 0836   RDW 13.9 10/04/2023 0921   RDW 13.9 11/02/2013 0903   Iron Studies No results found for: IRON, TIBC, FERRITIN, IRONPCTSAT Lipid Panel     Component Value Date/Time   CHOL 259 (H) 08/24/2023 0928   TRIG 120 08/24/2023 0928   HDL 43 08/24/2023 0928   CHOLHDL 4.7 (H) 02/01/2023 1154   LDLCALC 194 (H) 08/24/2023 0928   Hepatic Function Panel     Component Value Date/Time   PROT 6.9 10/04/2023 0921   PROT 7.6 11/02/2013 0903   ALBUMIN 4.2 10/04/2023 0921   ALBUMIN 3.5 11/02/2013 0903   AST 18 10/04/2023 0921   AST 18 11/02/2013 0903   ALT 12 10/04/2023 0921   ALT 20 11/02/2013 0903   ALKPHOS 98  10/04/2023 0921   ALKPHOS 97 11/02/2013 0903   BILITOT 0.3 10/04/2023 0921   BILITOT 0.4 11/02/2013 0903   BILIDIR <0.1 (L) 11/06/2015 1528   IBILI NOT CALCULATED 11/06/2015 1528      Component Value Date/Time   TSH 1.040 10/04/2023 0921   Nutritional Lab Results  Component Value Date   VD25OH 28.0 (L) 08/24/2023   VD25OH 13.2 (L) 10/21/2022   VD25OH 20.0 (L) 07/01/2016    Medications: Outpatient Encounter Medications as of 10/28/2023  Medication Sig Note   amLODipine  (NORVASC ) 10 MG tablet Take 1 tablet (10 mg  total) by mouth daily as needed (For chest pain).    apixaban  (ELIQUIS ) 5 MG TABS tablet Take 1 tablet (5 mg total) by mouth 2 (two) times daily.    B Complex-Biotin-FA (B-COMPLEX PO) Take 1 tablet by mouth daily.    colchicine  0.6 MG tablet Take 1 tablet (0.6 mg total) by mouth 2 (two) times daily for 5 days.    ezetimibe  (ZETIA ) 10 MG tablet Take 1 tablet (10 mg total) by mouth daily.    famotidine  (PEPCID ) 40 MG tablet Take 1 tablet (40 mg total) by mouth daily.    furosemide  (LASIX ) 20 MG tablet Take 1 tablet (20 mg total) by mouth daily as needed.    ibuprofen  (ADVIL ) 800 MG tablet TAKE 1 TABLET BY MOUTH EVERY 8 HOURS AS NEEDED    levothyroxine  (SYNTHROID ) 75 MCG tablet Take 75 mcg by mouth daily.    LORazepam  (ATIVAN ) 0.5 MG tablet Take 1 tablet (0.5 mg total) by mouth every 8 (eight) hours as needed. for anxiety    methocarbamol  (ROBAXIN ) 500 MG tablet TAKE 1 TABLET BY MOUTH AT BEDTIME.    metoprolol  succinate (TOPROL -XL) 50 MG 24 hr tablet Take 1.5 tablets (75 mg total) by mouth 2 (two) times daily. Take with or immediately following a meal.    Multiple Vitamins-Minerals (MULTIVITAMIN WITH MINERALS) tablet Take 1 tablet by mouth daily.    nitroGLYCERIN  (NITROSTAT ) 0.4 MG SL tablet Place 1 tablet (0.4 mg total) under the tongue every 5 (five) minutes as needed for chest pain.    ondansetron  (ZOFRAN ) 4 MG tablet Take 1 tablet (4 mg total) by mouth every 8 (eight) hours  as needed.    potassium chloride  SA (KLOR-CON  M) 20 MEQ tablet Take 1 tablet (20 mEq total) by mouth daily. 04/13/2023: PRN   simvastatin  (ZOCOR ) 10 MG tablet Take 1 tablet (10 mg total) by mouth at bedtime.    Vitamin D , Ergocalciferol , (DRISDOL ) 1.25 MG (50000 UNIT) CAPS capsule Take 1 capsule (50,000 Units total) by mouth every 7 (seven) days.    [DISCONTINUED] tirzepatide  (ZEPBOUND ) 7.5 MG/0.5ML Pen Inject 7.5 mg into the skin once a week.    tirzepatide  (ZEPBOUND ) 7.5 MG/0.5ML Pen Inject 7.5 mg into the skin once a week.    No facility-administered encounter medications on file as of 10/28/2023.     Follow-Up   Return in about 4 weeks (around 11/25/2023) for For Weight Mangement with Dr. Francyne.SABRA Heather Dudley was informed of the importance of frequent follow up visits to maximize her success with intensive lifestyle modifications for her multiple health conditions.  Attestation Statement   Reviewed by clinician on day of visit: allergies, medications, problem list, medical history, surgical history, family history, social history, and previous encounter notes.     Lucas Francyne, MD

## 2023-10-28 NOTE — Assessment & Plan Note (Signed)
 Blood pressure close to goal, target of less then 130 / 80 mmHg. Currently on amlodipine  and metoprolol . Weight loss and lifestyle modifications are expected to further improve blood pressure control. - Continue amlodipine  and metoprolol  as prescribed - Continue Zepbound  at current dose

## 2023-10-28 NOTE — Assessment & Plan Note (Signed)
 On CPAP with reported good compliance. Continue PAP therapy. Losing 15% or more of body weight may improve AHI.  Continue current weight management strategy inclusive of GLP-1

## 2023-10-28 NOTE — Assessment & Plan Note (Signed)
 Detected on ultrasound in 2013.  Most recent liver enzymes are within normal limits.   Fibrosis 4 Score = 1.11 (Low risk)        Interpretation for patients with NAFLD          <1.30       -  F0-F1 (Low risk)          1.30-2.67 -  Indeterminate           >2.67      -  F3-F4 (High risk)     Validated for ages 68-65   No further work-up. Losing 15% may improve conditions also avoiding alcohol and reducing simple and processed carbs.  Continue current weight management strategy inclusive of GLP-1.

## 2023-10-28 NOTE — Assessment & Plan Note (Signed)
 Improved using as necessary MiraLAX .

## 2023-10-28 NOTE — Assessment & Plan Note (Signed)
 Most recent A1c is now down to 5.7.  She is currently on Zepbound  for pharmacoprophylaxis and will continue medication along with nutritional and behavioral strategies for weight management.

## 2023-10-28 NOTE — Assessment & Plan Note (Addendum)
 Weight: decrease of 26 lb (9.7%) over 1 year, 1 month  Start: 09/30/2022 267 lb (121.1 kg)  End: 10/28/2023 241 lb (109.3 kg)  Class 3 obesity with significant weight loss of 26 pounds, approximately 10% of body weight, since starting the program. Currently on Zepbound  7.5 mg, showing a 31% reduction from highest recorded weight, equating to 11.7% of total body weight. Current dose expected to yield 15% weight loss, with potential for 20-22% at higher doses. - Continue Zepbound  7.5 mg for four weeks to assess tolerance and effectiveness. - Encourage 1500 calorie nutrition plan emphasizing whole foods, high fiber, lean protein, and healthy fats. - Encourage 240 minutes of weekly physical activity, including cardio and strength training. - Discuss exercise's role in boosting metabolism and maintaining weight loss. - Provide education on compatible foods with GLP-1 medications to minimize gastrointestinal side effects.

## 2023-11-04 ENCOUNTER — Encounter: Payer: Self-pay | Admitting: Cardiovascular Disease

## 2023-11-04 ENCOUNTER — Encounter (INDEPENDENT_AMBULATORY_CARE_PROVIDER_SITE_OTHER): Payer: Self-pay | Admitting: Internal Medicine

## 2023-11-05 ENCOUNTER — Telehealth: Payer: Self-pay | Admitting: Cardiovascular Disease

## 2023-11-05 DIAGNOSIS — I480 Paroxysmal atrial fibrillation: Secondary | ICD-10-CM

## 2023-11-05 MED ORDER — APIXABAN 5 MG PO TABS: 5.0000 mg | ORAL_TABLET | Freq: Two times a day (BID) | ORAL | 3 refills | Status: DC

## 2023-11-05 MED ORDER — APIXABAN 5 MG PO TABS: 5.0000 mg | ORAL_TABLET | Freq: Two times a day (BID) | ORAL | 1 refills | Status: DC

## 2023-11-05 NOTE — Telephone Encounter (Signed)
 Eliquis  5mg  refill request received. Patient is 63 years old, weight-109.3kg, Crea-0.93 on 10/04/23, Diagnosis-Afib, and last seen by Suzann Riddle on 04/13/23. Dose is appropriate based on dosing criteria. Will send in refill to requested pharmacy.

## 2023-11-05 NOTE — Telephone Encounter (Signed)
*  STAT* If patient is at the pharmacy, call can be transferred to refill team.   1. Which medications need to be refilled? (please list name of each medication and dose if known)   apixaban  (ELIQUIS ) 5 MG TABS tablet     2. Would you like to learn more about the convenience, safety, & potential cost savings by using the [facility]? No   3. Are you open to using the [facility] (Type [facility]. NO   4. Which pharmacy/location (including street and city if local pharmacy) is medication to be sent to? [facility] - [place], [place] - [address] [[id number]]    5. Do they need a 30 day or 90 day supply? 90 day

## 2023-11-08 ENCOUNTER — Telehealth: Payer: Self-pay | Admitting: *Deleted

## 2023-11-08 ENCOUNTER — Telehealth (INDEPENDENT_AMBULATORY_CARE_PROVIDER_SITE_OTHER): Payer: Self-pay | Admitting: Internal Medicine

## 2023-11-08 ENCOUNTER — Other Ambulatory Visit (INDEPENDENT_AMBULATORY_CARE_PROVIDER_SITE_OTHER): Payer: Self-pay

## 2023-11-08 DIAGNOSIS — R7303 Prediabetes: Secondary | ICD-10-CM

## 2023-11-08 DIAGNOSIS — G4733 Obstructive sleep apnea (adult) (pediatric): Secondary | ICD-10-CM

## 2023-11-08 DIAGNOSIS — K760 Fatty (change of) liver, not elsewhere classified: Secondary | ICD-10-CM

## 2023-11-08 MED ORDER — ZEPBOUND 7.5 MG/0.5ML ~~LOC~~ SOAJ: 7.5000 mg | SUBCUTANEOUS | 0 refills | Status: DC

## 2023-11-08 NOTE — Telephone Encounter (Signed)
 Mediation was sent to [facility], Pt notified earlier

## 2023-11-08 NOTE — Telephone Encounter (Signed)
 Patinet's Zepbound  was sent to [facility] in [place], but it needs to be sent to [facility].SABRA Please call this patient at [phone].

## 2023-11-08 NOTE — Telephone Encounter (Signed)
 The patient wants another sleeve left.  For LYMPHEDEMA .  She used clovers last time.I see that patient has not been here since 2023. ,  Patient said that she was good and did not need to come back since the 2023.  Told her that she needs to speak to the PCP and tell her that you need to get lymphedema sleeve.  She can call back if you cannot get anything done and then we will have to see what else we can do.  Patient understands.

## 2023-11-25 ENCOUNTER — Encounter (INDEPENDENT_AMBULATORY_CARE_PROVIDER_SITE_OTHER): Payer: Self-pay | Admitting: Internal Medicine

## 2023-11-25 ENCOUNTER — Ambulatory Visit (INDEPENDENT_AMBULATORY_CARE_PROVIDER_SITE_OTHER): Admitting: Internal Medicine

## 2023-11-25 VITALS — BP 136/87 | HR 90 | Temp 98.0°F | Ht 68.0 in | Wt 240.0 lb

## 2023-11-25 DIAGNOSIS — I10 Essential (primary) hypertension: Secondary | ICD-10-CM

## 2023-11-25 DIAGNOSIS — K760 Fatty (change of) liver, not elsewhere classified: Secondary | ICD-10-CM | POA: Diagnosis not present

## 2023-11-25 DIAGNOSIS — G4733 Obstructive sleep apnea (adult) (pediatric): Secondary | ICD-10-CM | POA: Diagnosis not present

## 2023-11-25 DIAGNOSIS — E782 Mixed hyperlipidemia: Secondary | ICD-10-CM

## 2023-11-25 DIAGNOSIS — R7303 Prediabetes: Secondary | ICD-10-CM | POA: Diagnosis not present

## 2023-11-25 DIAGNOSIS — E66812 Obesity, class 2: Secondary | ICD-10-CM

## 2023-11-25 DIAGNOSIS — Z6836 Body mass index (BMI) 36.0-36.9, adult: Secondary | ICD-10-CM

## 2023-11-25 MED ORDER — TIRZEPATIDE-WEIGHT MANAGEMENT 10 MG/0.5ML ~~LOC~~ SOAJ: 10.0000 mg | SUBCUTANEOUS | 0 refills | Status: DC

## 2023-11-25 NOTE — Assessment & Plan Note (Signed)
 On CPAP with reported good compliance. Continue PAP therapy. Losing 15% or more of body weight may improve AHI.  Continue current weight management strategy inclusive of GLP-1

## 2023-11-25 NOTE — Assessment & Plan Note (Signed)
 Detected on ultrasound in 2013.  Most recent liver enzymes are within normal limits.   Fibrosis 4 Score = 1.11 (Low risk)        Interpretation for patients with NAFLD          <1.30       -  F0-F1 (Low risk)          1.30-2.67 -  Indeterminate           >2.67      -  F3-F4 (High risk)     Validated for ages 68-65   No further work-up. Losing 15% may improve conditions also avoiding alcohol and reducing simple and processed carbs.  Continue current weight management strategy inclusive of GLP-1.

## 2023-11-25 NOTE — Assessment & Plan Note (Signed)
 Blood pressure readings elevated, recent measurement 153/80 mmHg, usual around 135/70 mmHg. Current medications include amlodipine  and metoprolol . Caffeine sensitivity may contribute to elevated readings. Informed of risks of uncontrolled hypertension, including kidney problems, heart failure, and stroke, especially in minority populations. - Monitor blood pressure at home in the morning before taking medications and before bedtime. - Inform primary care team if home blood pressure readings remain elevated. - Consider adjusting medication timing if morning or evening readings are elevated. -We are increasing tirzepatide  today which will help with blood pressure control.

## 2023-11-25 NOTE — Assessment & Plan Note (Signed)
 On simvastatin  with good tolerance and no side effects. Cholesterol levels to be checked next month. Dietary education provided on reducing saturated fats and opting for healthier fats. - Continue simvastatin . - Check cholesterol levels next month. - Reduce intake of saturated fats and opt for healthier fats.

## 2023-11-25 NOTE — Assessment & Plan Note (Signed)
 Most recent A1c is now down to 5.7.  She is currently on Zepbound  for pharmacoprophylaxis and will continue medication along with nutritional and behavioral strategies for weight management.

## 2023-11-25 NOTE — Progress Notes (Signed)
 Office: [phone]  /  Fax: [phone]  Weight Summary and Body Composition Analysis (BIA)  Vitals Temp: 98 F (36.7 C) BP: 136/87 Pulse Rate: 90 SpO2: 98 %   Anthropometric Measurements Height: 5' 8 (1.727 m) Weight: 240 lb (108.9 kg) BMI (Calculated): 36.5 Weight at Last Visit: 241 lb Weight Lost Since Last Visit: 1 lb Weight Gained Since Last Visit: 0 lb Starting Weight: 263 lb Total Weight Loss (lbs): 23 lb (10.4 kg) Peak Weight: 264 lb   Body Composition  Body Fat %: 37.2 % Fat Mass (lbs): 89.4 lbs Muscle Mass (lbs): 143.4 lbs Total Body Water (lbs): 91 lbs Visceral Fat Rating : 11    RMR: 1814  Today's Visit #: 11  Starting Date: 10/01/22   Subjective   Chief Complaint: Obesity  Interval History Discussed the use of AI scribe software for clinical note transcription with the patient, who gave verbal consent to proceed.  History of Present Illness Heather Dudley is a 63 year old female with hypertension, hyperlipidemia, and prediabetes who presents for medical weight management follow-up.  She is adhering to a 1500 calorie nutrition plan approximately 75% of the time and has lost one pound since her last visit. There is a decrease in 'food noise' and an improved sense of fullness after meals, aiding in better portion control. Her clothes fit better, particularly around the waist, despite minimal weight change, indicating a change in body composition with a decrease in body fat percentage from 44% to 37%.  She experiences mild nausea with her current medication regimen but describes it as manageable. She missed a week of her medication due to insurance issues, which led to a noticeable increase in appetite, although she managed to control her intake.  Her blood pressure readings at home are typically around 135/70 mmHg, but she reports a higher reading of 136/87 mmHg during the visit. She takes amlodipine  in the morning and metoprolol  around  midday. Her blood pressure tends to be higher after consuming coffee, to which she is sensitive.  Her last A1c was 5.7, down from 6.0. She is on simvastatin  for cholesterol management, which she tolerates well without side effects. She uses butter frequently but has been using olive oil and avocado oil as alternatives.     Challenges affecting patient progress: having difficulty with meal prep and planning, lack of strengthening exercise, medical comorbidities, and menopause.    Pharmacotherapy for weight management: She is currently taking Zepbound  with adequate clinical response  and without side effects..   Assessment and Plan   Treatment Plan For Obesity:  Recommended Dietary Goals  Heather Dudley is currently in the action stage of change. As such, her goal is to continue weight management plan. She has agreed to: continue current plan; educated on saturated fats.  She uses a lot of butter and coconut oil has a high LDL cholesterol.  Advised to work on reducing saturated fats in diet  [employer] and Counseling  We discussed the following behavioral modification strategies today: continue to work on maintaining a reduced calorie state, getting the recommended amount of protein, incorporating whole foods, making healthy choices, staying well hydrated and practicing mindfulness when eating. and increase protein intake, fibrous foods (25 grams per day for women, 30 grams for men) and water to improve satiety and decrease hunger signals. .  Additional education and resources provided today: None  Recommended Physical Activity Goals  Heather Dudley has been advised to work up to 150 minutes of moderate intensity aerobic  activity a week and strengthening exercises 2-3 times per week for cardiovascular health, weight loss maintenance and preservation of muscle mass.  She has agreed to :  Think about enjoyable ways to increase daily physical activity and overcoming barriers to exercise,  Increase physical activity in their day and reduce sedentary time (increase NEAT)., Increase volume of physical activity to a goal of 240 minutes a week, and Combine aerobic and strengthening exercises for efficiency and improved cardiometabolic health.  Medical Interventions and Pharmacotherapy  We discussed various medication options to help Heather Dudley with her weight loss efforts and we both agreed to : Increase Zepbound  to 10 mg once a week  Associated Conditions Impacted by Obesity Treatment  Assessment & Plan Essential hypertension Blood pressure readings elevated, recent measurement 153/80 mmHg, usual around 135/70 mmHg. Current medications include amlodipine  and metoprolol . Caffeine sensitivity may contribute to elevated readings. Informed of risks of uncontrolled hypertension, including kidney problems, heart failure, and stroke, especially in minority populations. - Monitor blood pressure at home in the morning before taking medications and before bedtime. - Inform primary care team if home blood pressure readings remain elevated. - Consider adjusting medication timing if morning or evening readings are elevated. -We are increasing tirzepatide  today which will help with blood pressure control. OSA on CPAP On CPAP with reported good compliance. Continue PAP therapy. Losing 15% or more of body weight may improve AHI.  Continue current weight management strategy inclusive of GLP-1 Metabolic dysfunction-associated steatotic liver disease (MASLD) Detected on ultrasound in 2013.  Most recent liver enzymes are within normal limits.   Fibrosis 4 Score = 1.11 (Low risk)        Interpretation for patients with NAFLD          <1.30       -  F0-F1 (Low risk)          1.30-2.67 -  Indeterminate           >2.67      -  F3-F4 (High risk)     Validated for ages 5-65   No further work-up. Losing 15% may improve conditions also avoiding alcohol and reducing simple and processed carbs.  Continue  current weight management strategy inclusive of GLP-1.  Class 2 severe obesity with serious comorbidity and body mass index (BMI) of 36.0 to 36.9 in adult, unspecified obesity type Weight: decrease of 32.8 lb (12%) over 2 years, 2 months  Start: 09/23/2021 272 lb 12.8 oz (123.7 kg)  End: 11/25/2023 240 lb (108.9 kg)  Prediabetes Most recent A1c is now down to 5.7.  She is currently on Zepbound  for pharmacoprophylaxis and will continue medication along with nutritional and behavioral strategies for weight management. Mixed hyperlipidemia On simvastatin  with good tolerance and no side effects. Cholesterol levels to be checked next month. Dietary education provided on reducing saturated fats and opting for healthier fats. - Continue simvastatin . - Check cholesterol levels next month. - Reduce intake of saturated fats and opt for healthier fats.         Objective   Physical Exam:  Blood pressure 136/87, pulse 90, temperature 98 F (36.7 C), height 5' 8 (1.727 m), weight 240 lb (108.9 kg), last menstrual period 04/01/2008, SpO2 98%. Body mass index is 36.49 kg/m.  General: She is overweight, cooperative, alert, well developed, and in no acute distress. PSYCH: Has normal mood, affect and thought process.   HEENT: EOMI, sclerae are anicteric. Lungs: Normal breathing effort, no conversational dyspnea. Extremities: No edema.  Neurologic:  No gross sensory or motor deficits. No tremors or fasciculations noted.    Diagnostic Data Reviewed:  BMET    Component Value Date/Time   NA 140 10/04/2023 0921   NA 137 02/22/2012 1443   K 4.4 10/04/2023 0921   K 3.6 09/19/2012 1011   CL 103 10/04/2023 0921   CL 101 02/22/2012 1443   CO2 20 10/04/2023 0921   CO2 27 02/22/2012 1443   GLUCOSE 83 10/04/2023 0921   GLUCOSE 106 (H) 01/11/2023 0836   GLUCOSE 102 (H) 02/22/2012 1443   BUN 16 10/04/2023 0921   BUN 13 02/22/2012 1443   CREATININE 0.93 10/04/2023 0921   CREATININE 1.09 11/02/2013  0903   CALCIUM  9.5 10/04/2023 0921   CALCIUM  9.0 02/22/2012 1443   GFRNONAA >60 01/11/2023 0836   GFRNONAA 59 (L) 11/02/2013 0903   GFRAA 68 07/11/2019 1143   GFRAA >60 11/02/2013 0903   Lab Results  Component Value Date   HGBA1C 5.7 (H) 08/24/2023   HGBA1C 5.8 (H) 07/11/2019   Lab Results  Component Value Date   INSULIN  19.6 10/21/2022   Lab Results  Component Value Date   TSH 1.040 10/04/2023   CBC    Component Value Date/Time   WBC 9.0 10/04/2023 0921   WBC 8.3 01/11/2023 0836   RBC 4.55 10/04/2023 0921   RBC 4.58 01/11/2023 0836   HGB 12.4 10/04/2023 0921   HCT 39.8 10/04/2023 0921   PLT 292 10/04/2023 0921   MCV 88 10/04/2023 0921   MCV 87 11/02/2013 0903   MCH 27.3 10/04/2023 0921   MCH 27.3 01/11/2023 0836   MCHC 31.2 (L) 10/04/2023 0921   MCHC 31.3 01/11/2023 0836   RDW 13.9 10/04/2023 0921   RDW 13.9 11/02/2013 0903   Iron Studies No results found for: IRON, TIBC, FERRITIN, IRONPCTSAT Lipid Panel     Component Value Date/Time   CHOL 259 (H) 08/24/2023 0928   TRIG 120 08/24/2023 0928   HDL 43 08/24/2023 0928   CHOLHDL 4.7 (H) 02/01/2023 1154   LDLCALC 194 (H) 08/24/2023 0928   Hepatic Function Panel     Component Value Date/Time   PROT 6.9 10/04/2023 0921   PROT 7.6 11/02/2013 0903   ALBUMIN 4.2 10/04/2023 0921   ALBUMIN 3.5 11/02/2013 0903   AST 18 10/04/2023 0921   AST 18 11/02/2013 0903   ALT 12 10/04/2023 0921   ALT 20 11/02/2013 0903   ALKPHOS 98 10/04/2023 0921   ALKPHOS 97 11/02/2013 0903   BILITOT 0.3 10/04/2023 0921   BILITOT 0.4 11/02/2013 0903   BILIDIR <0.1 (L) 11/06/2015 1528   IBILI NOT CALCULATED 11/06/2015 1528      Component Value Date/Time   TSH 1.040 10/04/2023 0921   Nutritional Lab Results  Component Value Date   VD25OH 28.0 (L) 08/24/2023   VD25OH 13.2 (L) 10/21/2022   VD25OH 20.0 (L) 07/01/2016    Medications: Outpatient Encounter Medications as of 11/25/2023  Medication Sig Note   amLODipine   (NORVASC ) 10 MG tablet Take 1 tablet (10 mg total) by mouth daily as needed (For chest pain).    apixaban  (ELIQUIS ) 5 MG TABS tablet Take 1 tablet (5 mg total) by mouth 2 (two) times daily.    B Complex-Biotin-FA (B-COMPLEX PO) Take 1 tablet by mouth daily.    colchicine  0.6 MG tablet Take 1 tablet (0.6 mg total) by mouth 2 (two) times daily for 5 days.    ezetimibe  (ZETIA ) 10 MG tablet Take 1 tablet (10 mg total)  by mouth daily.    famotidine  (PEPCID ) 40 MG tablet Take 1 tablet (40 mg total) by mouth daily.    furosemide  (LASIX ) 20 MG tablet Take 1 tablet (20 mg total) by mouth daily as needed.    ibuprofen  (ADVIL ) 800 MG tablet TAKE 1 TABLET BY MOUTH EVERY 8 HOURS AS NEEDED    levothyroxine  (SYNTHROID ) 75 MCG tablet Take 75 mcg by mouth daily.    LORazepam  (ATIVAN ) 0.5 MG tablet Take 1 tablet (0.5 mg total) by mouth every 8 (eight) hours as needed. for anxiety    methocarbamol  (ROBAXIN ) 500 MG tablet TAKE 1 TABLET BY MOUTH AT BEDTIME.    metoprolol  succinate (TOPROL -XL) 50 MG 24 hr tablet Take 1.5 tablets (75 mg total) by mouth 2 (two) times daily. Take with or immediately following a meal.    Multiple Vitamins-Minerals (MULTIVITAMIN WITH MINERALS) tablet Take 1 tablet by mouth daily.    nitroGLYCERIN  (NITROSTAT ) 0.4 MG SL tablet Place 1 tablet (0.4 mg total) under the tongue every 5 (five) minutes as needed for chest pain.    ondansetron  (ZOFRAN ) 4 MG tablet Take 1 tablet (4 mg total) by mouth every 8 (eight) hours as needed.    potassium chloride  SA (KLOR-CON  M) 20 MEQ tablet Take 1 tablet (20 mEq total) by mouth daily. 04/13/2023: PRN   simvastatin  (ZOCOR ) 10 MG tablet Take 1 tablet (10 mg total) by mouth at bedtime.    tirzepatide  (ZEPBOUND ) 10 MG/0.5ML Pen Inject 10 mg into the skin once a week.    Vitamin D , Ergocalciferol , (DRISDOL ) 1.25 MG (50000 UNIT) CAPS capsule Take 1 capsule (50,000 Units total) by mouth every 7 (seven) days.    [DISCONTINUED] tirzepatide  (ZEPBOUND ) 7.5 MG/0.5ML Pen  Inject 7.5 mg into the skin once a week.    No facility-administered encounter medications on file as of 11/25/2023.     Follow-Up   Return in about 4 weeks (around 12/23/2023) for For Weight Mangement with Dr. Francyne.SABRA She was informed of the importance of frequent follow up visits to maximize her success with intensive lifestyle modifications for her multiple health conditions.  Attestation Statement   Reviewed by clinician on day of visit: allergies, medications, problem list, medical history, surgical history, family history, social history, and previous encounter notes.     Lucas Francyne, MD

## 2023-11-25 NOTE — Assessment & Plan Note (Signed)
 Weight: decrease of 32.8 lb (12%) over 2 years, 2 months  Start: 09/23/2021 272 lb 12.8 oz (123.7 kg)  End: 11/25/2023 240 lb (108.9 kg)

## 2023-11-30 ENCOUNTER — Telehealth: Payer: Self-pay

## 2023-11-30 NOTE — Telephone Encounter (Signed)
 Copied from CRM #[id number]. Topic: General - Other >> Nov 30, 2023 11:16 AM Heather Dudley wrote: Reason for CRM: Patient stated she has a mammogram every year but due to being discharged wants to know if she needs to do it every year. Please call

## 2023-12-22 ENCOUNTER — Ambulatory Visit: Payer: Self-pay

## 2023-12-22 ENCOUNTER — Telehealth: Payer: Self-pay

## 2023-12-22 ENCOUNTER — Other Ambulatory Visit: Payer: Self-pay | Admitting: Internal Medicine

## 2023-12-22 DIAGNOSIS — I890 Lymphedema, not elsewhere classified: Secondary | ICD-10-CM

## 2023-12-22 NOTE — Telephone Encounter (Signed)
 Copied from CRM [id number]. Topic: Clinical - Prescription Issue >> Dec 22, 2023 11:35 AM Harlene ORN wrote: Reason for CRM: Patient called for a new prescription for sleeves and gloves for her lymphedema in her arms. Has periodic pain in her arms, and the sleeves help alleviate the pain.

## 2023-12-22 NOTE — Telephone Encounter (Signed)
 FYI Only or Action Required?: Action required by provider: referral request and clinical question for provider.  Patient was last seen in primary care on 11/25/2023 by Francyne Romano, MD.  Called Nurse Triage reporting Arm Pain.  Symptoms began several years ago.  Interventions attempted: OTC medications: tylenol .  Symptoms are: unchanged.  Triage Disposition: See PCP Within 2 Weeks  Patient/caregiver understands and will follow disposition?: No, wishes to speak with PCP  Copied from CRM [id number]. Topic: Clinical - Red Word Triage >> Dec 22, 2023 11:39 AM Harlene ORN wrote: Red Word that prompted transfer to Nurse Triage: Patient is having pain in her arms due to her lymphedema Reason for Disposition  Arm pain is a chronic symptom (recurrent or ongoing AND present > 4 weeks)  Answer Assessment - Initial Assessment Questions Patient has no new symptoms and requesting prescription for sleeve and glove;no longer being cared for by cancer clinic.   Advised call back/UC/ED if symptoms worsen.   1. ONSET: When did the pain start?     years 2. LOCATION: Where is the pain located?     Left arm; chronic symptom due to breast cancer, Dr. Justus already aware 3. PAIN: How bad is the pain? (Scale 0-10; or none, mild, moderate, severe)     1/10; uses tylenol  4. OTHER SYMPTOMS: Do you have any other symptoms? (e.g., neck pain, swelling, rash, fever, numbness, weakness)     Feels swollen and heaviness, numbness, MD already aware, no new symptoms. Patient reports already instructed how to massage area to alleviate fluids.  Protocols used: Arm Pain-A-AH

## 2023-12-22 NOTE — Telephone Encounter (Signed)
 Patient informed I faxed this order to [facility].

## 2023-12-22 NOTE — Progress Notes (Signed)
 Date:  12/22/2023   Name:  Heather Dudley   DOB:  1960/05/02   MRN:  [id number]   Chief Complaint: No chief complaint on file.  HPI  Review of Systems   Lab Results  Component Value Date   NA 140 10/04/2023   K 4.4 10/04/2023   CO2 20 10/04/2023   GLUCOSE 83 10/04/2023   BUN 16 10/04/2023   CREATININE 0.93 10/04/2023   CALCIUM  9.5 10/04/2023   EGFR 70 10/04/2023   GFRNONAA >60 01/11/2023   Lab Results  Component Value Date   CHOL 259 (H) 08/24/2023   HDL 43 08/24/2023   LDLCALC 194 (H) 08/24/2023   TRIG 120 08/24/2023   CHOLHDL 4.7 (H) 02/01/2023   Lab Results  Component Value Date   TSH 1.040 10/04/2023   Lab Results  Component Value Date   HGBA1C 5.7 (H) 08/24/2023   Lab Results  Component Value Date   WBC 9.0 10/04/2023   HGB 12.4 10/04/2023   HCT 39.8 10/04/2023   MCV 88 10/04/2023   PLT 292 10/04/2023   Lab Results  Component Value Date   ALT 12 10/04/2023   AST 18 10/04/2023   ALKPHOS 98 10/04/2023   BILITOT 0.3 10/04/2023   Lab Results  Component Value Date   VD25OH 28.0 (L) 08/24/2023     Patient Active Problem List   Diagnosis Date Noted   Obesity, unspecified 08/24/2023   Drug-induced constipation 06/21/2023   Statin intolerance 01/18/2023   Polyp of colon 12/10/2022   Class 3 severe obesity with serious comorbidity and body mass index (BMI) of 40.0 to 44.9 in adult (HCC) 10/21/2022   Metabolic dysfunction-associated steatotic liver disease (MASLD) 10/21/2022   Gastroesophageal reflux disease 09/30/2022   Generalized anxiety disorder 05/05/2022   Paroxysmal atrial fibrillation (HCC) 03/29/2022   Acquired thrombophilia 03/18/2022   Chronic diastolic heart failure (HCC) 03/18/2022   Prediabetes 12/16/2021   CAD (coronary artery disease), native coronary artery 12/06/2021   Lymphedema of left arm 09/23/2021   Nevus of neck 09/23/2021   OSA on CPAP 07/12/2020   S/P total abdominal hysterectomy 04/03/2020   Multiple thyroid   nodules 08/23/2018   Mixed hyperlipidemia 07/06/2018   Microscopic hematuria 05/04/2018   Liver hemangioma 03/26/2017   Macroglossia 02/05/2016   Environmental and seasonal allergies 01/02/2016   Vitamin D  deficiency 04/19/2015   Cervical radiculopathy 04/17/2015   Essential hypertension 04/17/2015   History of partial thyroidectomy 04/17/2015   Lipoma of axilla 10/24/2014   History of left breast cancer 09/02/2010    Allergies  Allergen Reactions   Ace Inhibitors Swelling   Betamethasone Dipropionate Itching and Other (See Comments)   Penicillins Rash    Has patient had a PCN reaction causing immediate rash, facial/tongue/throat swelling, SOB or lightheadedness with hypotension: Yes Has patient had a PCN reaction causing severe rash involving mucus membranes or skin necrosis: No Has patient had a PCN reaction that required hospitalization: No Has patient had a PCN reaction occurring within the last 10 years: No If all of the above answers are NO, then may proceed with Cephalosporin use.    Past Surgical History:  Procedure Laterality Date   ABDOMINAL HYSTERECTOMY  2011   ovaries remain   ATRIAL FIBRILLATION ABLATION N/A 01/12/2022   Procedure: ATRIAL FIBRILLATION ABLATION;  Surgeon: Cindie Ole DASEN, MD;  Location: [facility];  Service: Cardiovascular;  Laterality: N/A;   ATRIAL FIBRILLATION ABLATION N/A 01/11/2023   Procedure: ATRIAL FIBRILLATION ABLATION;  Surgeon: Cindie,  Ole DASEN, MD;  Location: [facility];  Service: Cardiovascular;  Laterality: N/A;   birth mark removed     BREAST CYST ASPIRATION Right    negative 01/2010   BREAST EXCISIONAL BIOPSY  08/05/2010   left breast positive 07/2010   BREAST LUMPECTOMY  2012   left breast   COLONOSCOPY  07/29/2012   normal study.   COLONOSCOPY WITH PROPOFOL  N/A 12/10/2022   Procedure: COLONOSCOPY WITH PROPOFOL ;  Surgeon: Jinny Carmine, MD;  Location: [facility] ENDOSCOPY;  Service: Endoscopy;  Laterality: N/A;    HEMOSTASIS CLIP PLACEMENT  12/10/2022   Procedure: HEMOSTASIS CLIP PLACEMENT;  Surgeon: Jinny Carmine, MD;  Location: [facility] ENDOSCOPY;  Service: Endoscopy;;   HOT HEMOSTASIS  12/10/2022   Procedure: HOT HEMOSTASIS (ARGON PLASMA COAGULATION/BICAP);  Surgeon: Jinny Carmine, MD;  Location: [facility] ENDOSCOPY;  Service: Endoscopy;;   LEFT HEART CATH AND CORONARY ANGIOGRAPHY N/A 12/08/2021   Procedure: LEFT HEART CATH AND CORONARY ANGIOGRAPHY;  Surgeon: Darron Deatrice LABOR, MD;  Location: [facility] INVASIVE CV [facility];  Service: Cardiovascular;  Laterality: N/A;   POLYPECTOMY  12/10/2022   Procedure: POLYPECTOMY;  Surgeon: Jinny Carmine, MD;  Location: [facility] ENDOSCOPY;  Service: Endoscopy;;   robotic tonsillectomy Lingual tonsils Bilateral 06/08/2016   THYROID  SURGERY     partially removed   TUBAL LIGATION      Social History   Tobacco Use   Smoking status: Never   Smokeless tobacco: Never   Tobacco comments:    Never smoke 02/09/22  Vaping Use   Vaping status: Never Used  Substance Use Topics   Alcohol use: Yes    Comment: rarely   Drug use: Never     Medication list has been reviewed and updated.  No outpatient medications have been marked as taking for the 12/22/23 encounter (Orders Only) with Justus Leita DEL, MD.       10/04/2023    8:38 AM 06/15/2023    3:40 PM 02/01/2023   11:02 AM 09/30/2022    9:57 AM  GAD 7 : Generalized Anxiety Score  Nervous, Anxious, on Edge 0 0 0 0  Control/stop worrying 0 0 0 0  Worry too much - different things 0 1 0 0  Trouble relaxing 0 0 1 1  Restless 0 0 1 1  Easily annoyed or irritable 0 1 0 1  Afraid - awful might happen 0 0 0 0  Total GAD 7 Score 0 2 2 3   Anxiety Difficulty Not difficult at all Not difficult at all Not difficult at all Not difficult at all       10/04/2023    8:38 AM 06/15/2023    3:39 PM 02/01/2023   11:02 AM  Depression screen PHQ 2/9  Decreased Interest 0 1 0  Down, Depressed, Hopeless 0 0 0  PHQ - 2 Score 0 1 0  Altered  sleeping 0 2 3  Tired, decreased energy 0 2 2  Change in appetite 0 0 1  Feeling bad or failure about yourself  0 0 0  Trouble concentrating 0 1 1  Moving slowly or fidgety/restless 0 0 1  Suicidal thoughts 0 0 0  PHQ-9 Score 0 6 8  Difficult doing work/chores Not difficult at all Not difficult at all Not difficult at all    BP Readings from Last 3 Encounters:  11/25/23 136/87  10/28/23 138/83  10/04/23 124/76    Physical Exam  Wt Readings from Last 3 Encounters:  11/25/23 240 lb (108.9 kg)  10/28/23 241  lb (109.3 kg)  10/04/23 246 lb (111.6 kg)    LMP 04/01/2008   Assessment and Plan:  Problem List Items Addressed This Visit   None   No follow-ups on file.    Leita HILARIO Adie, MD [facility] [place]

## 2023-12-23 ENCOUNTER — Ambulatory Visit (INDEPENDENT_AMBULATORY_CARE_PROVIDER_SITE_OTHER): Admitting: Internal Medicine

## 2023-12-27 ENCOUNTER — Other Ambulatory Visit: Payer: Self-pay | Admitting: Cardiovascular Disease

## 2023-12-28 ENCOUNTER — Ambulatory Visit (INDEPENDENT_AMBULATORY_CARE_PROVIDER_SITE_OTHER): Admitting: Internal Medicine

## 2023-12-28 ENCOUNTER — Ambulatory Visit
Admission: RE | Admit: 2023-12-28 | Discharge: 2023-12-28 | Disposition: A | Discharge status: Home / Self Care | Source: Ambulatory Visit | Attending: Internal Medicine | Admitting: Internal Medicine

## 2023-12-28 DIAGNOSIS — Z1231 Encounter for screening mammogram for malignant neoplasm of breast: Secondary | ICD-10-CM | POA: Diagnosis present

## 2023-12-30 ENCOUNTER — Ambulatory Visit (INDEPENDENT_AMBULATORY_CARE_PROVIDER_SITE_OTHER): Payer: Self-pay | Admitting: Internal Medicine

## 2023-12-30 ENCOUNTER — Encounter (INDEPENDENT_AMBULATORY_CARE_PROVIDER_SITE_OTHER): Payer: Self-pay | Admitting: Internal Medicine

## 2023-12-30 VITALS — BP 138/84 | HR 78 | Temp 98.1°F | Ht 68.0 in | Wt 237.0 lb

## 2023-12-30 DIAGNOSIS — K760 Fatty (change of) liver, not elsewhere classified: Secondary | ICD-10-CM | POA: Diagnosis not present

## 2023-12-30 DIAGNOSIS — G4733 Obstructive sleep apnea (adult) (pediatric): Secondary | ICD-10-CM

## 2023-12-30 DIAGNOSIS — Z6836 Body mass index (BMI) 36.0-36.9, adult: Secondary | ICD-10-CM

## 2023-12-30 DIAGNOSIS — E559 Vitamin D deficiency, unspecified: Secondary | ICD-10-CM

## 2023-12-30 DIAGNOSIS — I10 Essential (primary) hypertension: Secondary | ICD-10-CM | POA: Diagnosis not present

## 2023-12-30 DIAGNOSIS — R7303 Prediabetes: Secondary | ICD-10-CM | POA: Diagnosis not present

## 2023-12-30 DIAGNOSIS — E66812 Obesity, class 2: Secondary | ICD-10-CM

## 2023-12-30 MED ORDER — TIRZEPATIDE-WEIGHT MANAGEMENT 10 MG/0.5ML ~~LOC~~ SOAJ: 10.0000 mg | SUBCUTANEOUS | 0 refills | Status: DC

## 2023-12-30 MED ORDER — VITAMIN D3 50 MCG (2000 UT) PO CAPS: 2000.0000 [IU] | ORAL_CAPSULE | Freq: Every day | ORAL | Status: AC

## 2023-12-30 NOTE — Assessment & Plan Note (Signed)
 Blood pressure control is improving.  She is on amlodipine  10, metoprolol .  No side effects reported she is also now on tirzepatide  10 mg a day which will continue to have an effect on blood pressure.

## 2023-12-30 NOTE — Assessment & Plan Note (Signed)
 On CPAP with reported good compliance. Continue PAP therapy. Losing 15% or more of body weight may improve AHI.  Continue current weight management strategy inclusive of GLP-1
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
## 2023-12-30 NOTE — Assessment & Plan Note (Signed)
 Most recent A1c is now down to 5.7.  She is currently on Zepbound  for pharmacoprophylaxis and will continue medication along with nutritional and behavioral strategies for weight management.

## 2023-12-30 NOTE — Assessment & Plan Note (Signed)
 Detected on ultrasound in 2013.  Most recent liver enzymes are within normal limits.   Fibrosis 4 Score = 1.11 (Low risk)        Interpretation for patients with NAFLD          <1.30       -  F0-F1 (Low risk)          1.30-2.67 -  Indeterminate           >2.67      -  F3-F4 (High risk)     Validated for ages 31-65   No further work-up.  Continue current weight management strategy inclusive of GLP-1

## 2023-12-30 NOTE — Assessment & Plan Note (Signed)
 She has lost 35 pounds (13% of total body weight) since starting weight management in August 2024, with a current weight of 237 pounds. Body fat percentage decreased from 45% to 38%. She is on Zepbound  10 mg weekly, which has improved appetite control and reduced snacking. Reports mild constipation managed with MaxLax and occasional nausea, which has resolved. Emphasized the importance of lifestyle changes, including nutrition and physical activity, to maintain weight loss and reduce reliance on medication. - Continue Zepbound  10 mg weekly. - Provided handout with holiday nutrition tips. - Encouraged continued adherence to 1500 calorie nutrition plan. - Encouraged physical activity of 240 minutes per week. - Scheduled follow-up in one month.

## 2023-12-30 NOTE — Assessment & Plan Note (Signed)
 She has completed 4 months of vitamin D  supplementation high-dose.  She will transition to over-the-counter supplementation with D3 2000 international units daily.

## 2024-01-03 ENCOUNTER — Ambulatory Visit: Admitting: Emergency Medicine

## 2024-01-03 ENCOUNTER — Telehealth: Payer: Self-pay | Admitting: Cardiovascular Disease

## 2024-01-03 DIAGNOSIS — Z01810 Encounter for preprocedural cardiovascular examination: Secondary | ICD-10-CM

## 2024-01-03 NOTE — Telephone Encounter (Signed)
 I s/w the pt and assured her that she was on the schedule for a tele preop appt. Pt thanked me for the call.

## 2024-01-03 NOTE — Telephone Encounter (Signed)
 Pt calling concerned that her tele visit appt today at 1:40 was not showing in her appt desk on My Chart. Advised pt it was because it had been marked as Arrived on our side. Pt wanted a message sent to make sure she would still receive a call. Please advise.

## 2024-01-03 NOTE — Progress Notes (Signed)
 Virtual Visit via Telephone Note   Because of Heather Dudley co-morbid illnesses, she is at least at moderate risk for complications without adequate follow up.  This format is felt to be most appropriate for this patient at this time.  Due to technical limitations with video connection (technology), today's appointment will be conducted as an audio only telehealth visit, and Heather Dudley verbally agreed to proceed in this manner.   All issues noted in this document were discussed and addressed.  No physical exam could be performed with this format.  Evaluation Performed:  Preoperative cardiovascular risk assessment _____________   Date:  01/03/2024   [name], DOB 08/04/1960, MRN [id number] Patient Location:  Home Provider location:   Office  Primary Care Provider:  Justus Leita VEAR, MD Primary Cardiologist:  Heather Lunger, MD  Chief Complaint / Patient Profile   63 y.o. y/o female with a h/o no significant CAD on LHC (2023), atrial fibrillation/flutter s/p ablation X2 (12/2021, 12/2022), anemia, breast cancer, diastolic dysfunction, GERD, HTN, hypothyroidism, sleep apnea who is pending colonoscopy on 01/13/2024 and presents today for telephonic preoperative cardiovascular risk assessment.  History of Present Illness    Heather Dudley is a 63 y.o. female who presents via audio/video conferencing for a telehealth visit today. LHC on 12/08/2021 showed nearly normal coronary arteries with no significant coronary artery disease.  Normal LV systolic function and  moderately elevated left ventricular end-diastolic pressure 26 mmHg.This was in the setting of p her underlying arrhythmia and diastolic heart failure.  Echo at this time showed EF 55-60%, normal diastolic parameters. She was last seen in cardiology clinic on 04/13/2023 by Heather Needle, NP.  At that time Heather Dudley was doing well. The patient is now pending procedure as  outlined above. Since her last visit, Heather Dudley reports no change from her general baseline. Reports mild, intermittent SOB that occurs with hot flashes for years ever since developing early menopause secondary to her hx of breast cancer. She states her SOB occasionally happens on exertion, but not always, and states she feels like it is her baseline/remains unchanged. Reports she prefers sleeping propped up due to multiple, large thyroid  nodules that make her feel as though she is choking when she lies flat. She is compliant with her CPAP. She denies chest pain, palpitations, n, v, dark/tarry/bloody stools, hematuria, dizziness, syncope, edema, weight gain.   Past Medical History    Past Medical History:  Diagnosis Date   A-fib (HCC)    Anemia    Back pain    Basal cell carcinoma of forehead    birthmark of forehead   Blood in stool 05/13/2021   Breast cancer (HCC)    Breast cyst, right 05/08/2020   Breast screening, unspecified    Cellulitis and abscess of trunk    Chest pain    Diastolic dysfunction    a. 05/2019 Echo: EF 55-60%, no rwma, mild LVH, Gr2 DD, Nl RV size/fxn. Triv MR.   Edema of both lower extremities    Fatty liver    GERD (gastroesophageal reflux disease)    Hernia    History of chemotherapy 2012   Hot flashes related to aromatase inhibitor therapy 09/08/2015   Hypertension    Hypothyroidism    Joint pain    Lump or mass in breast    Malignant neoplasm of breast (female), unspecified site    She underwent wide excision, mastoplasty and axillary dissection for her left breast malignancy  on September 03, 2010. The primary tumor was 1 cm in diameter, and a single positive axillary node 1.3 cm in diameter. This was an ER-positive, PR slightly positive, HER-2/neu not overexpressing tumor. She tolerated her whole breast radiation without difficulty ending in late February 2013.   Malignant neoplasm of upper-outer quadrant of female breast (HCC)    Neoplasm of uncertain behavior  of connective and other soft tissue    Obesity, unspecified    PAF (paroxysmal atrial fibrillation) (HCC) 05/04/2017   a. 04/2019 Zio: RSR, avg HR 79. 2% PAF burden (73-175, avg 115; longest 28secs). CHA2DS2VASc = 2-->Xarelto .   Palpitations    Peroneal tendinitis, left 09/30/2020   Personal history of chemotherapy    Personal history of malignant neoplasm of breast    The patient underwent wide excision, mastoplasty. Dissection for a T1b, N1, M0 carcinoma left breast on September 03, 2010.The primary tumor was 1 cm in diameter, and a single positive axillary node 1.3 cm in diameter. This was an ER-positive, PR slightly positive, HER-2/neu not overexpressing tumor.  The patient completed whole breast radiation in February 2013.   Personal history of radiation therapy 2013   LEFT lumpectomy   Pre-diabetes    Screening for obesity    Sleep apnea    SOB (shortness of breath)    Thyroid  nodule    Past Surgical History:  Procedure Laterality Date   ABDOMINAL HYSTERECTOMY  2011   ovaries remain   ATRIAL FIBRILLATION ABLATION N/A 01/12/2022   Procedure: ATRIAL FIBRILLATION ABLATION;  Surgeon: Heather Ole DASEN, MD;  Location: [facility];  Service: Cardiovascular;  Laterality: N/A;   ATRIAL FIBRILLATION ABLATION N/A 01/11/2023   Procedure: ATRIAL FIBRILLATION ABLATION;  Surgeon: Heather Ole DASEN, MD;  Location: [facility];  Service: Cardiovascular;  Laterality: N/A;   birth mark removed     BREAST CYST ASPIRATION Right    negative 01/2010   BREAST EXCISIONAL BIOPSY  08/05/2010   left breast positive 07/2010   BREAST LUMPECTOMY  2012   left breast   COLONOSCOPY  07/29/2012   normal study.   COLONOSCOPY WITH PROPOFOL  N/A 12/10/2022   Procedure: COLONOSCOPY WITH PROPOFOL ;  Surgeon: Heather Carmine, MD;  Location: [facility] ENDOSCOPY;  Service: Endoscopy;  Laterality: N/A;   HEMOSTASIS CLIP PLACEMENT  12/10/2022   Procedure: HEMOSTASIS CLIP PLACEMENT;  Surgeon: Heather Carmine, MD;   Location: [facility] ENDOSCOPY;  Service: Endoscopy;;   HOT HEMOSTASIS  12/10/2022   Procedure: HOT HEMOSTASIS (ARGON PLASMA COAGULATION/BICAP);  Surgeon: Heather Carmine, MD;  Location: [facility] ENDOSCOPY;  Service: Endoscopy;;   LEFT HEART CATH AND CORONARY ANGIOGRAPHY N/A 12/08/2021   Procedure: LEFT HEART CATH AND CORONARY ANGIOGRAPHY;  Surgeon: Darron Deatrice LABOR, MD;  Location: [facility] INVASIVE CV LAB;  Service: Cardiovascular;  Laterality: N/A;   POLYPECTOMY  12/10/2022   Procedure: POLYPECTOMY;  Surgeon: Heather Carmine, MD;  Location: [facility] ENDOSCOPY;  Service: Endoscopy;;   robotic tonsillectomy Lingual tonsils Bilateral 06/08/2016   THYROID  SURGERY     partially removed   TUBAL LIGATION      Allergies  Allergies  Allergen Reactions   Ace Inhibitors Swelling   Betamethasone Dipropionate Itching and Other (See Comments)   Penicillins Rash    Has patient had a PCN reaction causing immediate rash, facial/tongue/throat swelling, SOB or lightheadedness with hypotension: Yes Has patient had a PCN reaction causing severe rash involving mucus membranes or skin necrosis: No Has patient had a PCN reaction that required hospitalization: No Has patient had  a PCN reaction occurring within the last 10 years: No If all of the above answers are NO, then may proceed with Cephalosporin use.    Home Medications    Prior to Admission medications   Medication Sig Start Date End Date Taking? Authorizing Provider  amLODipine  (NORVASC ) 10 MG tablet TAKE 1 TABLET BY MOUTH DAILY AS  NEEDED FOR CHEST PAIN 12/29/23   Perla Heather PARAS, MD  apixaban  (ELIQUIS ) 5 MG TABS tablet Take 1 tablet (5 mg total) by mouth 2 (two) times daily. 11/05/23   Gollan, Timothy J, MD  B Complex-Biotin-FA (B-COMPLEX PO) Take 1 tablet by mouth daily.    [provider]  Cholecalciferol (VITAMIN D3) 50 MCG (2000 UT) capsule Take 1 capsule (2,000 Units total) by mouth daily. 12/30/23   Francyne Romano, MD  colchicine  0.6 MG tablet  Take 1 tablet (0.6 mg total) by mouth 2 (two) times daily for 5 days. 01/11/23   Leverne Charlies Helling, PA-C  ezetimibe  (ZETIA ) 10 MG tablet Take 1 tablet (10 mg total) by mouth daily. 04/13/23   Riddle, Suzann, NP  famotidine  (PEPCID ) 40 MG tablet Take 1 tablet (40 mg total) by mouth daily. 10/04/23   Heather Leita DEL, MD  furosemide  (LASIX ) 20 MG tablet Take 1 tablet (20 mg total) by mouth daily as needed. 10/04/23   Heather Leita DEL, MD  ibuprofen  (ADVIL ) 800 MG tablet TAKE 1 TABLET BY MOUTH EVERY 8 HOURS AS NEEDED 07/15/23   Heather Leita DEL, MD  levothyroxine  (SYNTHROID ) 75 MCG tablet Take 75 mcg by mouth daily. 10/10/20   [provider]  LORazepam  (ATIVAN ) 0.5 MG tablet Take 1 tablet (0.5 mg total) by mouth every 8 (eight) hours as needed. for anxiety 06/15/23   Heather Leita DEL, MD  methocarbamol  (ROBAXIN ) 500 MG tablet TAKE 1 TABLET BY MOUTH AT BEDTIME. 07/15/23   Heather Leita DEL, MD  metoprolol  succinate (TOPROL -XL) 50 MG 24 hr tablet Take 1.5 tablets (75 mg total) by mouth 2 (two) times daily. Take with or immediately following a meal. 05/28/23   Riddle, Suzann, NP  Multiple Vitamins-Minerals (MULTIVITAMIN WITH MINERALS) tablet Take 1 tablet by mouth daily. 04/12/23   Francyne Romano, MD  nitroGLYCERIN  (NITROSTAT ) 0.4 MG SL tablet Place 1 tablet (0.4 mg total) under the tongue every 5 (five) minutes as needed for chest pain. 12/09/21   Alexander, Natalie, DO  ondansetron  (ZOFRAN ) 4 MG tablet Take 1 tablet (4 mg total) by mouth every 8 (eight) hours as needed. 10/28/23   Francyne Romano, MD  potassium chloride  SA (KLOR-CON  M) 20 MEQ tablet Take 1 tablet (20 mEq total) by mouth daily. 12/04/22   Gollan, Timothy J, MD  simvastatin  (ZOCOR ) 10 MG tablet Take 1 tablet (10 mg total) by mouth at bedtime. 10/04/23   Heather Leita DEL, MD  tirzepatide  (ZEPBOUND ) 10 MG/0.5ML Pen Inject 10 mg into the skin once a week. 12/30/23   Francyne Romano, MD    Physical Exam    Vital Signs:  Delon DEL Oram does not have vital signs available for review today.  Given telephonic nature of communication, physical exam is limited. AAOx3. NAD. Normal affect.  Speech and respirations are unlabored.  Accessory Clinical Findings    None  Assessment & Plan    1.  Preoperative Cardiovascular Risk Assessment: According to the Revised Cardiac Risk Index (RCRI), her Perioperative Risk of Major Cardiac Event is (%): 0.9  Her Functional Capacity in METs is: 5.07 according to the [facility] Activity Status  Index (DASI). Therefore, based on ACC/AHA guidelines, patient would be at acceptable risk for the planned procedure without further cardiovascular testing.  The patient was advised that if she develops new symptoms prior to surgery to contact our office to arrange for a follow-up visit, and she verbalized understanding.  Per office protocol, patient can hold Eliquis  for 2 days prior to procedure.   Patient will not need bridging with Lovenox (enoxaparin) around procedure.  A copy of this note will be routed to requesting surgeon.  Time:   Today, I have spent 10 minutes with the patient with telehealth technology discussing medical history, symptoms, and management plan.     Matthew Pais E Andreka Stucki, NP  01/03/2024, 3:15 PM

## 2024-01-05 ENCOUNTER — Telehealth (INDEPENDENT_AMBULATORY_CARE_PROVIDER_SITE_OTHER): Payer: Self-pay

## 2024-01-05 NOTE — Telephone Encounter (Signed)
 Heather Dudley  (Key: [id number]) Zepbound  2.5MG /0.5ML pen-injectors Waiting for Determination

## 2024-01-06 NOTE — Telephone Encounter (Signed)
 PA for Zepbound  2.5 MG has been approved. PA is now complete.     Message from Plan Request Reference Number: [id number]. ZEPBOUND  INJ 2.5/0.5 is approved through 07/04/2024. Your patient may now fill this prescription and it will be covered.. Authorization Expiration Date: Jul 04, 2024.

## 2024-01-07 NOTE — Telephone Encounter (Signed)
 The patient called back in checking on the status of her request to have a sleeve and glove sent to [facility] due to Lymphedema. Please assist patient further.

## 2024-01-13 ENCOUNTER — Ambulatory Visit: Admitting: Anesthesiology

## 2024-01-13 ENCOUNTER — Encounter: Payer: Self-pay | Admitting: Gastroenterology

## 2024-01-13 ENCOUNTER — Encounter
Admission: RE | Disposition: A | Discharge status: Home / Self Care | Payer: Self-pay | Source: Home / Self Care | Attending: Gastroenterology

## 2024-01-13 ENCOUNTER — Ambulatory Visit
Admission: RE | Admit: 2024-01-13 | Discharge: 2024-01-13 | Disposition: A | Discharge status: Home / Self Care | Attending: Gastroenterology | Admitting: Gastroenterology

## 2024-01-13 DIAGNOSIS — I4891 Unspecified atrial fibrillation: Secondary | ICD-10-CM | POA: Insufficient documentation

## 2024-01-13 DIAGNOSIS — Z860100 Personal history of colon polyps, unspecified: Secondary | ICD-10-CM

## 2024-01-13 DIAGNOSIS — I10 Essential (primary) hypertension: Secondary | ICD-10-CM | POA: Insufficient documentation

## 2024-01-13 DIAGNOSIS — I2510 Atherosclerotic heart disease of native coronary artery without angina pectoris: Secondary | ICD-10-CM | POA: Diagnosis not present

## 2024-01-13 DIAGNOSIS — G709 Myoneural disorder, unspecified: Secondary | ICD-10-CM | POA: Diagnosis not present

## 2024-01-13 DIAGNOSIS — G4730 Sleep apnea, unspecified: Secondary | ICD-10-CM | POA: Diagnosis not present

## 2024-01-13 DIAGNOSIS — K641 Second degree hemorrhoids: Secondary | ICD-10-CM | POA: Diagnosis not present

## 2024-01-13 DIAGNOSIS — K219 Gastro-esophageal reflux disease without esophagitis: Secondary | ICD-10-CM | POA: Diagnosis not present

## 2024-01-13 DIAGNOSIS — E039 Hypothyroidism, unspecified: Secondary | ICD-10-CM | POA: Insufficient documentation

## 2024-01-13 DIAGNOSIS — Z7901 Long term (current) use of anticoagulants: Secondary | ICD-10-CM | POA: Insufficient documentation

## 2024-01-13 DIAGNOSIS — D122 Benign neoplasm of ascending colon: Secondary | ICD-10-CM | POA: Diagnosis not present

## 2024-01-13 DIAGNOSIS — K635 Polyp of colon: Secondary | ICD-10-CM | POA: Insufficient documentation

## 2024-01-13 DIAGNOSIS — Z8249 Family history of ischemic heart disease and other diseases of the circulatory system: Secondary | ICD-10-CM | POA: Diagnosis not present

## 2024-01-13 DIAGNOSIS — Z1211 Encounter for screening for malignant neoplasm of colon: Secondary | ICD-10-CM | POA: Diagnosis not present

## 2024-01-13 DIAGNOSIS — Z860101 Personal history of adenomatous and serrated colon polyps: Secondary | ICD-10-CM | POA: Diagnosis present

## 2024-01-13 HISTORY — PX: COLONOSCOPY: SHX5424

## 2024-01-13 SURGERY — COLONOSCOPY
Anesthesia: General

## 2024-01-13 MED ORDER — DEXMEDETOMIDINE HCL IN NACL 80 MCG/20ML IV SOLN
INTRAVENOUS | Status: DC | PRN
  Administered 2024-01-13: 12 ug via INTRAVENOUS
  Administered 2024-01-13: 8 ug via INTRAVENOUS

## 2024-01-13 MED ORDER — PROPOFOL 500 MG/50ML IV EMUL
INTRAVENOUS | Status: DC | PRN
Start: 2024-01-13 — End: 2024-01-13
  Administered 2024-01-13: 75 ug/kg/min via INTRAVENOUS

## 2024-01-13 MED ORDER — LIDOCAINE HCL (CARDIAC) PF 100 MG/5ML IV SOSY
PREFILLED_SYRINGE | INTRAVENOUS | Status: DC | PRN
  Administered 2024-01-13: 80 mg via INTRAVENOUS

## 2024-01-13 MED ORDER — PROPOFOL 10 MG/ML IV BOLUS
INTRAVENOUS | Status: DC | PRN
  Administered 2024-01-13: 50 mg via INTRAVENOUS
  Administered 2024-01-13: 20 mg via INTRAVENOUS

## 2024-01-13 MED ORDER — LIDOCAINE HCL (PF) 2 % IJ SOLN
INTRAMUSCULAR | Status: AC
  Filled 2024-01-13: qty 5

## 2024-01-13 MED ORDER — SODIUM CHLORIDE 0.9 % IV SOLN
INTRAVENOUS | Status: DC
  Administered 2024-01-13: 500 mL via INTRAVENOUS

## 2024-01-13 NOTE — H&P (Signed)
 Rogelia Copping, MD [facility] [address], [address] [place], [place] [address] [phone] Fax : [phone]  Primary Care Physician:  Justus Leita DEL, MD Primary Gastroenterologist:  Dr. Copping  Pre-Procedure History & Physical: HPI:  Heather Dudley is a 63 y.o. female is here for an colonoscopy.   Past Medical History:  Diagnosis Date   A-fib (HCC)    Anemia    Back pain    Basal cell carcinoma of forehead    birthmark of forehead   Blood in stool 05/13/2021   Breast cancer (HCC)    Breast cyst, right 05/08/2020   Breast screening, unspecified    Cellulitis and abscess of trunk    Chest pain    Diastolic dysfunction    a. 05/2019 Echo: EF 55-60%, no rwma, mild LVH, Gr2 DD, Nl RV size/fxn. Triv MR.   Edema of both lower extremities    Fatty liver    GERD (gastroesophageal reflux disease)    Hernia    History of chemotherapy 2012   Hot flashes related to aromatase inhibitor therapy 09/08/2015   Hypertension    Hypothyroidism    Joint pain    Lump or mass in breast    Malignant neoplasm of breast (female), unspecified site    She underwent wide excision, mastoplasty and axillary dissection for her left breast malignancy on September 03, 2010. The primary tumor was 1 cm in diameter, and a single positive axillary node 1.3 cm in diameter. This was an ER-positive, PR slightly positive, HER-2/neu not overexpressing tumor. She tolerated her whole breast radiation without difficulty ending in late February 2013.   Malignant neoplasm of upper-outer quadrant of female breast (HCC)    Neoplasm of uncertain behavior of connective and other soft tissue    Obesity, unspecified    PAF (paroxysmal atrial fibrillation) (HCC) 05/04/2017   a. 04/2019 Zio: RSR, avg HR 79. 2% PAF burden (73-175, avg 115; longest 28secs). CHA2DS2VASc = 2-->Xarelto .   Palpitations    Peroneal tendinitis, left 09/30/2020   Personal history of chemotherapy    Personal history of malignant neoplasm of  breast    The patient underwent wide excision, mastoplasty. Dissection for a T1b, N1, M0 carcinoma left breast on September 03, 2010.The primary tumor was 1 cm in diameter, and a single positive axillary node 1.3 cm in diameter. This was an ER-positive, PR slightly positive, HER-2/neu not overexpressing tumor.  The patient completed whole breast radiation in February 2013.   Personal history of radiation therapy 2013   LEFT lumpectomy   Pre-diabetes    Screening for obesity    Sleep apnea    SOB (shortness of breath)    Thyroid  nodule     Past Surgical History:  Procedure Laterality Date   ABDOMINAL HYSTERECTOMY  2011   ovaries remain   ATRIAL FIBRILLATION ABLATION N/A 01/12/2022   Procedure: ATRIAL FIBRILLATION ABLATION;  Surgeon: Cindie Ole DASEN, MD;  Location: [facility];  Service: Cardiovascular;  Laterality: N/A;   ATRIAL FIBRILLATION ABLATION N/A 01/11/2023   Procedure: ATRIAL FIBRILLATION ABLATION;  Surgeon: Cindie Ole DASEN, MD;  Location: [facility];  Service: Cardiovascular;  Laterality: N/A;   birth mark removed     BREAST CYST ASPIRATION Right    negative 01/2010   BREAST EXCISIONAL BIOPSY  08/05/2010   left breast positive 07/2010   BREAST LUMPECTOMY  2012   left breast   COLONOSCOPY  07/29/2012   normal study.   COLONOSCOPY WITH PROPOFOL   N/A 12/10/2022   Procedure: COLONOSCOPY WITH PROPOFOL ;  Surgeon: Jinny Carmine, MD;  Location[facility] ENDOSCOPY;  Service: Endoscopy;  Laterality: N/A;   HEMOSTASIS CLIP PLACEMENT  12/10/2022   Procedure: HEMOSTASIS CLIP PLACEMENT;  Surgeon: Jinny Carmine, MD;  Location: [facility] ENDOSCOPY;  Service: Endoscopy;;   HOT HEMOSTASIS  12/10/2022   Procedure: HOT HEMOSTASIS (ARGON PLASMA COAGULATION/BICAP);  Surgeon: Jinny Carmine, MD;  Location: [facility] ENDOSCOPY;  Service: Endoscopy;;   LEFT HEART CATH AND CORONARY ANGIOGRAPHY N/A 12/08/2021   Procedure: LEFT HEART CATH AND CORONARY ANGIOGRAPHY;  Surgeon: Darron Deatrice LABOR, MD;   Location: [facility] INVASIVE CV LAB;  Service: Cardiovascular;  Laterality: N/A;   POLYPECTOMY  12/10/2022   Procedure: POLYPECTOMY;  Surgeon: Jinny Carmine, MD;  Location[facility] ENDOSCOPY;  Service: Endoscopy;;   robotic tonsillectomy Lingual tonsils Bilateral 06/08/2016   THYROID  SURGERY     partially removed   TUBAL LIGATION      Prior to Admission medications   Medication Sig Start Date End Date Taking? Authorizing Provider  amLODipine  (NORVASC ) 10 MG tablet TAKE 1 TABLET BY MOUTH DAILY AS  NEEDED FOR CHEST PAIN 12/29/23   Gollan, Timothy J, MD  apixaban  (ELIQUIS ) 5 MG TABS tablet Take 1 tablet (5 mg total) by mouth 2 (two) times daily. 11/05/23   Gollan, Timothy J, MD  B Complex-Biotin-FA (B-COMPLEX PO) Take 1 tablet by mouth daily.    [provider]  Cholecalciferol (VITAMIN D3) 50 MCG (2000 UT) capsule Take 1 capsule (2,000 Units total) by mouth daily. 12/30/23   Francyne Romano, MD  colchicine  0.6 MG tablet Take 1 tablet (0.6 mg total) by mouth 2 (two) times daily for 5 days. 01/11/23   Leverne Charlies Helling, PA-C  ezetimibe  (ZETIA ) 10 MG tablet Take 1 tablet (10 mg total) by mouth daily. 04/13/23   Riddle, Suzann, NP  famotidine  (PEPCID ) 40 MG tablet Take 1 tablet (40 mg total) by mouth daily. 10/04/23   Justus Leita DEL, MD  furosemide  (LASIX ) 20 MG tablet Take 1 tablet (20 mg total) by mouth daily as needed. 10/04/23   Justus Leita DEL, MD  ibuprofen  (ADVIL ) 800 MG tablet TAKE 1 TABLET BY MOUTH EVERY 8 HOURS AS NEEDED 07/15/23   Justus Leita DEL, MD  levothyroxine  (SYNTHROID ) 75 MCG tablet Take 75 mcg by mouth daily. 10/10/20   [provider]  LORazepam  (ATIVAN ) 0.5 MG tablet Take 1 tablet (0.5 mg total) by mouth every 8 (eight) hours as needed. for anxiety 06/15/23   Justus Leita DEL, MD  methocarbamol  (ROBAXIN ) 500 MG tablet TAKE 1 TABLET BY MOUTH AT BEDTIME. 07/15/23   Justus Leita DEL, MD  metoprolol  succinate (TOPROL -XL) 50 MG 24 hr tablet Take 1.5 tablets (75 mg total)  by mouth 2 (two) times daily. Take with or immediately following a meal. 05/28/23   Riddle, Suzann, NP  Multiple Vitamins-Minerals (MULTIVITAMIN WITH MINERALS) tablet Take 1 tablet by mouth daily. 04/12/23   Francyne Romano, MD  nitroGLYCERIN  (NITROSTAT ) 0.4 MG SL tablet Place 1 tablet (0.4 mg total) under the tongue every 5 (five) minutes as needed for chest pain. 12/09/21   Alexander, Natalie, DO  ondansetron  (ZOFRAN ) 4 MG tablet Take 1 tablet (4 mg total) by mouth every 8 (eight) hours as needed. 10/28/23   Francyne Romano, MD  potassium chloride  SA (KLOR-CON  M) 20 MEQ tablet Take 1 tablet (20 mEq total) by mouth daily. 12/04/22   Gollan, Timothy J, MD  simvastatin  (ZOCOR ) 10 MG tablet Take 1 tablet (10 mg total) by  mouth at bedtime. 10/04/23   Justus Leita DEL, MD  tirzepatide  (ZEPBOUND ) 10 MG/0.5ML Pen Inject 10 mg into the skin once a week. 12/30/23   Francyne Romano, MD    Allergies as of 10/06/2023 - Review Complete 10/04/2023  Allergen Reaction Noted   Ace inhibitors Swelling 12/15/2015   Sernivo [betamethasone dipropionate] Itching 02/14/2018   Penicillins Rash 04/01/2012    Family History  Problem Relation Age of Onset   Cancer Mother        lung ca   High blood pressure Mother    Thyroid  disease Mother    Heart failure Father    High blood pressure Father    Heart disease Father    Cancer Maternal Uncle        lung ca   Cancer Paternal Aunt        ovarian ca   Cancer Cousin        female ca   Colon cancer Other    Ovarian cancer Other    Breast cancer Neg Hx     Social History   Socioeconomic History   Marital status: Married    Spouse name: Ossie Viscuso   Number of children: 3   Years of education: 16   Highest education level: Bachelor's degree (e.g., BA, AB, BS)  Occupational History   Occupation: Marine Scientist  Tobacco Use   Smoking status: Never   Smokeless tobacco: Never   Tobacco comments:    Never smoke 02/09/22  Vaping Use   Vaping  status: Never Used  Substance and Sexual Activity   Alcohol use: Yes    Comment: rarely   Drug use: Never   Sexual activity: Yes    Partners: Male    Birth control/protection: Surgical  Other Topics Concern   Not on file  Social History Narrative   Not on file   Social Drivers of Health   Financial Resource Strain: Low Risk  (05/05/2022)   Overall Financial Resource Strain (CARDIA)    Difficulty of Paying Living Expenses: Not hard at all  Food Insecurity: No Food Insecurity (05/05/2022)   Hunger Vital Sign    Worried About Running Out of Food in the Last Year: Never true    Ran Out of Food in the Last Year: Never true  Transportation Needs: No Transportation Needs (05/05/2022)   PRAPARE - Administrator, Civil Service (Medical): No    Lack of Transportation (Non-Medical): No  Physical Activity: Not on file  Stress: Not on file  Social Connections: Not on file  Intimate Partner Violence: Not At Risk (05/05/2022)   Humiliation, Afraid, Rape, and Kick questionnaire    Fear of Current or Ex-Partner: No    Emotionally Abused: No    Physically Abused: No    Sexually Abused: No    Review of Systems: See HPI, otherwise negative ROS  Physical Exam: LMP 04/01/2008  General:   Alert,  pleasant and cooperative in NAD Head:  Normocephalic and atraumatic. Neck:  Supple; no masses or thyromegaly. Lungs:  Clear throughout to auscultation.    Heart:  Regular rate and rhythm. Abdomen:  Soft, nontender and nondistended. Normal bowel sounds, without guarding, and without rebound.   Neurologic:  Alert and  oriented x4;  grossly normal neurologically.  Impression/Plan: AARADHYA KYSAR is here for an colonoscopy to be performed for a history of adenomatous polyps on 2024   Risks, benefits, limitations, and alternatives regarding  colonoscopy have been reviewed with the patient.  Questions have been answered.  All parties agreeable.   Rogelia Copping, MD  01/13/2024, 10:09  AM

## 2024-01-13 NOTE — Op Note (Signed)
 [facility] Gastroenterology Patient Name: Heather Dudley Procedure Date: 01/13/2024 10:29 AM MRN: [id number] Account #: [id number] Date of Birth: 1960/12/28 Admit Type: Outpatient Age: 63 Room: [facility] ENDO ROOM 4 Gender: Female Note Status: Finalized Instrument Name: Colon Scope [id number] Procedure:             Colonoscopy Indications:           High risk colon cancer surveillance: Personal history                         of colonic polyps Providers:             Rogelia Copping MD, MD Referring MD:          Leita Adie, MD (Referring MD) Medicines:             Propofol  per Anesthesia Complications:         No immediate complications. Procedure:             Pre-Anesthesia Assessment:                        - Prior to the procedure, a History and Physical was                         performed, and patient medications and allergies were                         reviewed. The patient's tolerance of previous                         anesthesia was also reviewed. The risks and benefits                         of the procedure and the sedation options and risks                         were discussed with the patient. All questions were                         answered, and informed consent was obtained. Prior                         Anticoagulants: The patient has taken no anticoagulant                         or antiplatelet agents. ASA Grade Assessment: II - A                         patient with mild systemic disease. After reviewing                         the risks and benefits, the patient was deemed in                         satisfactory condition to undergo the procedure.                        After obtaining informed consent, the colonoscope was  passed under direct vision. Throughout the procedure,                         the patient's blood pressure, pulse, and oxygen                         saturations were monitored continuously. The                          Colonoscope was introduced through the anus and                         advanced to the the cecum, identified by appendiceal                         orifice and ileocecal valve. The colonoscopy was                         performed without difficulty. The patient tolerated                         the procedure well. The quality of the bowel                         preparation was excellent. Findings:      The perianal and digital rectal examinations were normal.      Two sessile polyps were found in the cecum. The polyps were 1 to 2 mm in       size. These polyps were removed with a cold snare. Resection and       retrieval were complete.      A 2 mm polyp was found in the ascending colon. The polyp was sessile.       The polyp was removed with a cold snare. Resection and retrieval were       complete.      Non-bleeding internal hemorrhoids were found during retroflexion. The       hemorrhoids were Grade II (internal hemorrhoids that prolapse but reduce       spontaneously). Impression:            - Two 1 to 2 mm polyps in the cecum, removed with a                         cold snare. Resected and retrieved.                        - One 2 mm polyp in the ascending colon, removed with                         a cold snare. Resected and retrieved.                        - Non-bleeding internal hemorrhoids. Recommendation:        - Discharge patient to home.                        - Resume previous diet.                        -  Continue present medications.                        - Repeat colonoscopy in 5 years for surveillance. Procedure Code(s):     --- Professional ---                        [phone], Colonoscopy, flexible; with removal of                         tumor(s), polyp(s), or other lesion(s) by snare                         technique Diagnosis Code(s):     --- Professional ---                        Z86.010, Personal history of colonic polyps                         D12.0, Benign neoplasm of cecum CPT copyright 2022 American Medical Association. All rights reserved. The codes documented in this report are preliminary and upon coder review may  be revised to meet current compliance requirements. Rogelia Copping MD, MD 01/13/2024 10:51:40 AM This report has been signed electronically. Number of Addenda: 0 Note Initiated On: 01/13/2024 10:29 AM Scope Withdrawal Time: 0 hours 11 minutes 14 seconds  Total Procedure Duration: 0 hours 14 minutes 20 seconds  Estimated Blood Loss:  Estimated blood loss: none.      [facility]

## 2024-01-13 NOTE — Transfer of Care (Signed)
 Immediate Anesthesia Transfer of Care Note  Patient: Heather Dudley  Procedure(s) Performed: COLONOSCOPY  Patient Location: PACU  Anesthesia Type:General  Level of Consciousness: sedated  Airway & Oxygen Therapy: Patient Spontanous Breathing  Post-op Assessment: Report given to RN and Post -op Vital signs reviewed and stable  Post vital signs: Reviewed and stable  Last Vitals:  Vitals Value Taken Time  BP 87/44 01/13/24 10:54  Temp    Pulse 69 01/13/24 10:55  Resp 19 01/13/24 10:55  SpO2 100 % 01/13/24 10:55  Vitals shown include unfiled device data.  Last Pain:  Vitals:   01/13/24 1054  TempSrc:   PainSc: 0-No pain         Complications: No notable events documented.

## 2024-01-13 NOTE — Anesthesia Preprocedure Evaluation (Signed)
 Anesthesia Evaluation  Patient identified by MRN, date of birth, ID band Patient awake    Reviewed: Allergy & Precautions, NPO status , Patient's Chart, lab work & pertinent test results  History of Anesthesia Complications Negative for: history of anesthetic complications  Airway Mallampati: III  TM Distance: <3 FB Neck ROM: full    Dental  (+) Chipped   Pulmonary neg shortness of breath, sleep apnea    Pulmonary exam normal        Cardiovascular Exercise Tolerance: Good hypertension, + CAD  + dysrhythmias Atrial Fibrillation      Neuro/Psych  Neuromuscular disease  negative psych ROS   GI/Hepatic Neg liver ROS,GERD  Controlled,,  Endo/Other  Hypothyroidism    Renal/GU negative Renal ROS  negative genitourinary   Musculoskeletal   Abdominal   Peds  Hematology negative hematology ROS (+)   Anesthesia Other Findings Past Medical History: No date: A-fib (HCC) No date: Anemia No date: Back pain No date: Basal cell carcinoma of forehead     Comment:  birthmark of forehead 05/13/2021: Blood in stool No date: Breast cancer (HCC) 05/08/2020: Breast cyst, right No date: Breast screening, unspecified No date: Cellulitis and abscess of trunk No date: Chest pain No date: Diastolic dysfunction     Comment:  a. 05/2019 Echo: EF 55-60%, no rwma, mild LVH, Gr2 DD, Nl              RV size/fxn. Triv MR. No date: Edema of both lower extremities No date: Fatty liver No date: GERD (gastroesophageal reflux disease) No date: Hernia 2012: History of chemotherapy 09/08/2015: Hot flashes related to aromatase inhibitor therapy No date: Hypertension No date: Hypothyroidism No date: Joint pain No date: Lump or mass in breast No date: Malignant neoplasm of breast (female), unspecified site     Comment:  She underwent wide excision, mastoplasty and axillary               dissection for her left breast malignancy on September 03, 2010. The primary tumor was 1 cm in diameter, and a               single positive axillary node 1.3 cm in diameter. This               was an ER-positive, PR slightly positive, HER-2/neu not               overexpressing tumor. She tolerated her whole breast               radiation without difficulty ending in late February               2013. No date: Malignant neoplasm of upper-outer quadrant of female breast  (HCC) No date: Neoplasm of uncertain behavior of connective and other soft  tissue No date: Obesity, unspecified 05/04/2017: PAF (paroxysmal atrial fibrillation) (HCC)     Comment:  a. 04/2019 Zio: RSR, avg HR 79. 2% PAF burden (73-175,               avg 115; longest 28secs). CHA2DS2VASc =               2-->Xarelto . No date: Palpitations 09/30/2020: Peroneal tendinitis, left No date: Personal history of chemotherapy No date: Personal history of malignant neoplasm of breast     Comment:  The patient underwent wide excision, mastoplasty.  Dissection for a T1b, N1, M0 carcinoma left breast on               September 03, 2010.The primary tumor was 1 cm in diameter, and              a single positive axillary node 1.3 cm in diameter. This               was an ER-positive, PR slightly positive, HER-2/neu not               overexpressing tumor.  The patient completed whole breast              radiation in February 2013. 2013: Personal history of radiation therapy     Comment:  LEFT lumpectomy No date: Pre-diabetes No date: Screening for obesity No date: Sleep apnea No date: SOB (shortness of breath) No date: Thyroid  nodule  Past Surgical History: 2011: ABDOMINAL HYSTERECTOMY     Comment:  ovaries remain 01/12/2022: ATRIAL FIBRILLATION ABLATION; N/A     Comment:  Procedure: ATRIAL FIBRILLATION ABLATION;  Surgeon:               Cindie Ole DASEN, MD;  Location: [facility] INVASIVE CV LAB;                Service: Cardiovascular;  Laterality: N/A; 01/11/2023:  ATRIAL FIBRILLATION ABLATION; N/A     Comment:  Procedure: ATRIAL FIBRILLATION ABLATION;  Surgeon:               Cindie Ole DASEN, MD;  Location: [facility] INVASIVE CV LAB;                Service: Cardiovascular;  Laterality: N/A; No date: birth mark removed No date: BREAST CYST ASPIRATION; Right     Comment:  negative 01/2010 08/05/2010: BREAST EXCISIONAL BIOPSY     Comment:  left breast positive 07/2010 2012: BREAST LUMPECTOMY     Comment:  left breast 07/29/2012: COLONOSCOPY     Comment:  normal study. 12/10/2022: COLONOSCOPY WITH PROPOFOL ; N/A     Comment:  Procedure: COLONOSCOPY WITH PROPOFOL ;  Surgeon: Jinny Carmine, MD;  Location: [facility] ENDOSCOPY;  Service:               Endoscopy;  Laterality: N/A; 12/10/2022: HEMOSTASIS CLIP PLACEMENT     Comment:  Procedure: HEMOSTASIS CLIP PLACEMENT;  Surgeon: Jinny Carmine, MD;  Location: [facility] ENDOSCOPY;  Service:               Endoscopy;; 12/10/2022: HOT HEMOSTASIS     Comment:  Procedure: HOT HEMOSTASIS (ARGON PLASMA               COAGULATION/BICAP);  Surgeon: Jinny Carmine, MD;                Location: [facility] ENDOSCOPY;  Service: Endoscopy;; 12/08/2021: LEFT HEART CATH AND CORONARY ANGIOGRAPHY; N/A     Comment:  Procedure: LEFT HEART CATH AND CORONARY ANGIOGRAPHY;                Surgeon: Darron Deatrice LABOR, MD;  Location: [facility] INVASIVE               CV LAB;  Service: Cardiovascular;  Laterality: N/A; 12/10/2022: POLYPECTOMY     Comment:  Procedure: POLYPECTOMY;  Surgeon: Jinny Carmine, MD;  Location: [facility] ENDOSCOPY;  Service: Endoscopy;; 06/08/2016: robotic tonsillectomy Lingual tonsils; Bilateral No date: THYROID  SURGERY     Comment:  partially removed No date: TUBAL LIGATION     Reproductive/Obstetrics negative OB ROS                              Anesthesia Physical Anesthesia Plan  ASA: 3  Anesthesia Plan: General   Post-op Pain Management:    Induction:  Intravenous  PONV Risk Score and Plan: Propofol  infusion and TIVA  Airway Management Planned: Natural Airway and Nasal Cannula  Additional Equipment:   Intra-op Plan:   Post-operative Plan:   Informed Consent: I have reviewed the patients History and Physical, chart, labs and discussed the procedure including the risks, benefits and alternatives for the proposed anesthesia with the patient or authorized representative who has indicated his/her understanding and acceptance.     Dental Advisory Given  Plan Discussed with: Anesthesiologist, CRNA and Surgeon  Anesthesia Plan Comments: (Patient consented for risks of anesthesia including but not limited to:  - adverse reactions to medications - risk of airway placement if required - damage to eyes, teeth, lips or other oral mucosa - nerve damage due to positioning  - sore throat or hoarseness - Damage to heart, brain, nerves, lungs, other parts of body or loss of life  Patient voiced understanding and assent.)        Anesthesia Quick Evaluation

## 2024-01-13 NOTE — Anesthesia Postprocedure Evaluation (Signed)
 Anesthesia Post Note  Patient: Heather Dudley  Procedure(s) Performed: COLONOSCOPY  Patient location during evaluation: Endoscopy Anesthesia Type: General Level of consciousness: awake and alert Pain management: pain level controlled Vital Signs Assessment: post-procedure vital signs reviewed and stable Respiratory status: spontaneous breathing, nonlabored ventilation and respiratory function stable Cardiovascular status: blood pressure returned to baseline and stable Postop Assessment: no apparent nausea or vomiting Anesthetic complications: no   No notable events documented.   Last Vitals:  Vitals:   01/13/24 1104 01/13/24 1116  BP: 127/65 113/66  Pulse: 67 68  Resp: 19 16  Temp:    SpO2: 100% 100%    Last Pain:  Vitals:   01/13/24 1054  TempSrc:   PainSc: 0-No pain                 Fairy POUR Tyjah Hai

## 2024-01-17 LAB — SURGICAL PATHOLOGY

## 2024-01-18 ENCOUNTER — Telehealth: Payer: Self-pay

## 2024-01-18 ENCOUNTER — Other Ambulatory Visit: Payer: Self-pay | Admitting: Internal Medicine

## 2024-01-18 DIAGNOSIS — I10 Essential (primary) hypertension: Secondary | ICD-10-CM

## 2024-01-18 NOTE — Telephone Encounter (Signed)
 Copied from CRM [id number]. Topic: Clinical - Medication Refill >> Jan 18, 2024  3:39 PM Emylou G wrote: Medication: simvastatin  (ZOCOR ) 10 MG tablet furosemide  (LASIX ) 20 MG tablet   Has the patient contacted their pharmacy? Yes (Agent: If no, request that the patient contact the pharmacy for the refill. If patient does not wish to contact the pharmacy document the reason why and proceed with request.) (Agent: If yes, when and what did the pharmacy advise?) said to call  This is the patient's preferred pharmacy:    [facility] [place], [place] - [address] [place] [address] Phone: [phone] Fax: [phone]  Is this the correct pharmacy for this prescription? Yes If no, delete pharmacy and type the correct one.   Has the prescription been filled recently? No  Is the patient out of the medication? yes  Has the patient been seen for an appointment in the last year OR does the patient have an upcoming appointment? Yes  Can we respond through MyChart? Yes  Agent: Please be advised that Rx refills may take up to 3 business days. We ask that you follow-up with your pharmacy.

## 2024-01-18 NOTE — Telephone Encounter (Signed)
 Copied from CRM [id number]. Topic: Clinical - Prescription Issue >> Dec 22, 2023 11:35 AM Heather Dudley ORN wrote: Reason for CRM: Heather Dudley called for a new prescription for sleeves and gloves for her lymphedema in her arms. Has periodic pain in her arms, and the sleeves help alleviate the pain. >> Jan 18, 2024  3:42 PM Heather Dudley G wrote: Heather Dudley called.. checking status if we faxed the order in ?  Pls call Heather Dudley back

## 2024-01-18 NOTE — Progress Notes (Unsigned)
  Electrophysiology Office Follow up Visit Note:    Date:  01/19/2024   ID:  Heather Dudley, DOB Sep 22, 1960, MRN [id number]  PCP:  Justus Leita VEAR, MD  [facility] Cardiologist:  Evalene Lunger, MD  [facility] Electrophysiologist:  OLE ONEIDA HOLTS, MD    Interval History:     Heather Dudley is a 63 y.o. female who presents for a follow up visit.   The patient last saw Elvie April 13, 2023.  She has a history of persistent atrial fibrillation and flutter, sleep apnea and hypertension.  She had an ablation in 2023 and a redo ablation in 2024.  At the last appointment she reported a significant improvement in her A-fib burden.  She takes Eliquis  for stroke prophylaxis.  She is off propafenone . She is doing well today.  She reports no episodes of atrial fibrillation.       Past medical, surgical, social and family history were reviewed.  ROS:   Please see the history of present illness.    All other systems reviewed and are negative.  EKGs/Labs/Other Studies Reviewed:    The following studies were reviewed today:          Physical Exam:    VS:  BP 128/72 (BP Location: Right Arm, Patient Position: Sitting, Cuff Size: Normal)   Pulse 77   Ht 5' 7 (1.702 m)   Wt 238 lb 9.6 oz (108.2 kg)   LMP 04/01/2008   SpO2 99%   BMI 37.37 kg/m     Wt Readings from Last 3 Encounters:  01/19/24 238 lb 9.6 oz (108.2 kg)  01/13/24 235 lb (106.6 kg)  12/30/23 237 lb (107.5 kg)     GEN: no distress CARD: RRR, No MRG RESP: No IWOB. CTAB.      ASSESSMENT:    1. Persistent atrial fibrillation (HCC)   2. OSA on CPAP   3. Typical atrial flutter (HCC)    PLAN:    In order of problems listed above:  #Persistent atrial fibrillation and flutter Significant improvement in burden after 2 ablations with no recent episodes.  Continue Eliquis  for stroke prophylaxis.  Continue metoprolol .  #Hypertension At goal today.  Recommend checking blood pressures  1-2 times per week at home and recording the values.  Recommend bringing these recordings to the primary care physician. Continue amlodipine   #Sleep apnea CPAP use encouraged  I discussed my upcoming departure from [facility] during today's clinic appointment.  The patient will continue to follow-up with one of my EP partners moving forward.  Follow-up with EP APP in 1 year  Signed, Ole Holts, MD, [facility], [facility] 01/19/2024 3:50 PM    Electrophysiology [facility]

## 2024-01-19 ENCOUNTER — Ambulatory Visit: Payer: Self-pay | Admitting: Gastroenterology

## 2024-01-19 ENCOUNTER — Ambulatory Visit: Attending: Cardiology | Admitting: Cardiology

## 2024-01-19 ENCOUNTER — Encounter: Payer: Self-pay | Admitting: Cardiology

## 2024-01-19 VITALS — BP 128/72 | HR 77 | Ht 67.0 in | Wt 238.6 lb

## 2024-01-19 DIAGNOSIS — G4733 Obstructive sleep apnea (adult) (pediatric): Secondary | ICD-10-CM

## 2024-01-19 DIAGNOSIS — I483 Typical atrial flutter: Secondary | ICD-10-CM | POA: Diagnosis not present

## 2024-01-19 DIAGNOSIS — I4819 Other persistent atrial fibrillation: Secondary | ICD-10-CM

## 2024-01-19 NOTE — Patient Instructions (Signed)

## 2024-01-19 NOTE — Telephone Encounter (Signed)
 Please review and advise.  JM

## 2024-01-21 MED ORDER — SIMVASTATIN 10 MG PO TABS: 10.0000 mg | ORAL_TABLET | Freq: Every day | ORAL | 0 refills | Status: DC

## 2024-01-21 MED ORDER — FUROSEMIDE 20 MG PO TABS: 20.0000 mg | ORAL_TABLET | Freq: Every day | ORAL | 0 refills | Status: DC | PRN

## 2024-01-21 NOTE — Telephone Encounter (Signed)
 Requested Prescriptions  Pending Prescriptions Disp Refills   furosemide  (LASIX ) 20 MG tablet 90 tablet 0    Sig: Take 1 tablet (20 mg total) by mouth daily as needed.     Cardiovascular:  Diuretics - Loop Failed - 01/21/2024 11:58 AM      Failed - Mg Level in normal range and within 180 days    Magnesium  Date Value Ref Range Status  12/06/2021 1.9 1.7 - 2.4 mg/dL Final    Comment:    Performed at [facility], [address] [place], [place] [address]  09/19/2012 2.1 mg/dL Final    Comment:    8.1-7.5 THERAPEUTIC RANGE: 4-7 mg/dL TOXIC: > 10 mg/dL  ----------------------- LABS - This specimen was collected through an   - indwelling catheter or arterial line.  - A minimum of of blood was wasted prior    - to collecting the sample.  Interpret  - results with caution.          Passed - K in normal range and within 180 days    Potassium  Date Value Ref Range Status  10/04/2023 4.4 3.5 - 5.2 mmol/L Final  09/19/2012 3.6 3.5 - 5.1 mmol/L Final         Passed - Ca in normal range and within 180 days    Calcium   Date Value Ref Range Status  10/04/2023 9.5 8.7 - 10.3 mg/dL Final   Calcium , Total  Date Value Ref Range Status  02/22/2012 9.0 8.5 - 10.1 mg/dL Final         Passed - Na in normal range and within 180 days    Sodium  Date Value Ref Range Status  10/04/2023 140 134 - 144 mmol/L Final  02/22/2012 137 136 - 145 mmol/L Final         Passed - Cr in normal range and within 180 days    Creatinine  Date Value Ref Range Status  11/02/2013 1.09 0.60 - 1.30 mg/dL Final   Creatinine, Ser  Date Value Ref Range Status  10/04/2023 0.93 0.57 - 1.00 mg/dL Final         Passed - Cl in normal range and within 180 days    Chloride  Date Value Ref Range Status  10/04/2023 103 96 - 106 mmol/L Final  02/22/2012 101 98 - 107 mmol/L Final         Passed - Last BP in normal range    BP Readings from Last 1 Encounters:  01/19/24 128/72         Passed  - Valid encounter within last 6 months    Recent Outpatient Visits           3 months ago Annual physical exam   [facility], Leita DEL, MD   7 months ago Sacroiliac joint pain   [facility], Leita DEL, MD               simvastatin  (ZOCOR ) 10 MG tablet 90 tablet 0    Sig: Take 1 tablet (10 mg total) by mouth at bedtime.     Cardiovascular:  Antilipid - Statins Failed - 01/21/2024 11:58 AM      Failed - Lipid Panel in normal range within the last 12 months    Cholesterol, Total  Date Value Ref Range Status  08/24/2023 259 (H) 100 - 199 mg/dL Final  LDL Chol Calc (NIH)  Date Value Ref Range Status  08/24/2023 194 (H) 0 - 99 mg/dL Final   HDL  Date Value Ref Range Status  08/24/2023 43 >39 mg/dL Final   Triglycerides  Date Value Ref Range Status  08/24/2023 120 0 - 149 mg/dL Final         Passed - Patient is not pregnant      Passed - Valid encounter within last 12 months    Recent Outpatient Visits           3 months ago Annual physical exam   [facility], Leita DEL, MD   7 months ago Sacroiliac joint pain   [facility] at [facility], Leita DEL, MD

## 2024-01-24 ENCOUNTER — Ambulatory Visit: Admitting: Internal Medicine

## 2024-01-24 NOTE — Telephone Encounter (Signed)
 Called and spoke with patient. She said that she went to [facility] and they had not gotten our DME sent in October. Told her I will refax today with [employer].  - CM

## 2024-01-26 ENCOUNTER — Other Ambulatory Visit: Payer: Self-pay | Admitting: Cardiovascular Disease

## 2024-01-27 ENCOUNTER — Ambulatory Visit (INDEPENDENT_AMBULATORY_CARE_PROVIDER_SITE_OTHER): Admitting: Internal Medicine

## 2024-01-27 ENCOUNTER — Encounter (INDEPENDENT_AMBULATORY_CARE_PROVIDER_SITE_OTHER): Payer: Self-pay | Admitting: Internal Medicine

## 2024-01-27 VITALS — BP 126/78 | HR 71 | Temp 98.1°F | Ht 68.0 in | Wt 233.0 lb

## 2024-01-27 DIAGNOSIS — I10 Essential (primary) hypertension: Secondary | ICD-10-CM

## 2024-01-27 DIAGNOSIS — E7800 Pure hypercholesterolemia, unspecified: Secondary | ICD-10-CM

## 2024-01-27 DIAGNOSIS — K760 Fatty (change of) liver, not elsewhere classified: Secondary | ICD-10-CM | POA: Diagnosis not present

## 2024-01-27 DIAGNOSIS — G4733 Obstructive sleep apnea (adult) (pediatric): Secondary | ICD-10-CM

## 2024-01-27 DIAGNOSIS — E66812 Obesity, class 2: Secondary | ICD-10-CM

## 2024-01-27 DIAGNOSIS — Z6836 Body mass index (BMI) 36.0-36.9, adult: Secondary | ICD-10-CM

## 2024-01-27 MED ORDER — TIRZEPATIDE-WEIGHT MANAGEMENT 10 MG/0.5ML ~~LOC~~ SOAJ: 10.0000 mg | SUBCUTANEOUS | 0 refills | Status: AC

## 2024-01-27 NOTE — Progress Notes (Signed)
 Office: [phone]  /  Fax: [phone]  Weight Summary and Body Composition Analysis (BIA)  Vitals Temp: 98.1 F (36.7 C) BP: 126/78 Pulse Rate: 71 SpO2: 100 %   Anthropometric Measurements Height: 5' 8 (1.727 m) Weight: 233 lb (105.7 kg) BMI (Calculated): 35.44 Weight at Last Visit: 237 lb Weight Lost Since Last Visit: 4 lb Weight Gained Since Last Visit: 0 lb Starting Weight: 263 lb Total Weight Loss (lbs): 30 lb (13.6 kg) Peak Weight: 264 lb   Body Composition  Body Fat %: 38.7 % Fat Mass (lbs): 90.4 lbs Muscle Mass (lbs): 136 lbs Total Body Water (lbs): 86 lbs Visceral Fat Rating : 11    RMR: 1814  Today's Visit #: 13  Starting Date: 10/01/22   Subjective   Chief Complaint: Obesity  Interval History Discussed the use of AI scribe software for clinical note transcription with the patient, who gave verbal consent to proceed.  History of Present Illness Heather Dudley is a 63 year old female who presents for medical weight management.  Since last office visit she has lost 4 pounds.  She is following a 1500-calorie nutrition plan 50% of the time she is exercising 4 days a week 30 minutes doing cardio.  She has been actively engaged in weight management, successfully losing four pounds over the holidays through portion control and increased physical activity, including walking more at work. She has been on a weight management medication since January, with a dose increase in October, and experiences good appetite suppression. She was off Zepbound  for two weeks due to a colonoscopy but did not experience increased hunger or withdrawal symptoms.  She experiences hot flashes and finds that taking omega-3 supplements before bed helps reduce the severity, although they do not completely eliminate the symptoms. The supplements can cause burping and loose stools.  Her past medical history includes sleep apnea, high blood pressure, fatty liver, and  prediabetes. She is currently taking Zetia  and simvastatin  for cholesterol management but has not taken simvastatin  for two months due to prescription issues.  She does not like eating breakfast and prefers to have coffee or a boiled egg if she feels the need to eat in the morning. She has been making healthier food choices, including eating more vegetables and ensuring adequate protein intake.     Challenges affecting patient progress: none.    Pharmacotherapy for weight management: She is currently taking Zepbound  with adequate clinical response  and without side effects..   Assessment and Plan   Treatment Plan For Obesity:  Recommended Dietary Goals  Heather Dudley is currently in the action stage of change. As such, her goal is to continue weight management plan. She has agreed to: continue current plan  Behavioral Health and Counseling  We discussed the following behavioral modification strategies today: continue to work on maintaining a reduced calorie state, getting the recommended amount of protein, incorporating whole foods, making healthy choices, staying well hydrated and practicing mindfulness when eating. and increase protein intake, fibrous foods (25 grams per day for women, 30 grams for men) and water to improve satiety and decrease hunger signals. .  Additional education and resources provided today: None  Recommended Physical Activity Goals  Heather Dudley has been advised to work up to 150 minutes of moderate intensity aerobic activity a week and strengthening exercises 2-3 times per week for cardiovascular health, weight loss maintenance and preservation of muscle mass.  She has agreed to :  Think about enjoyable ways to increase daily physical  activity and overcoming barriers to exercise, Increase physical activity in their day and reduce sedentary time (increase NEAT)., Increase volume of physical activity to a goal of 240 minutes a week, and Combine aerobic and strengthening  exercises for efficiency and improved cardiometabolic health.  Medical Interventions and Pharmacotherapy  We discussed various medication options to help Heather Dudley with her weight loss efforts and we both agreed to : Adequate clinical response to anti-obesity medication, continue current regimen  Associated Conditions Impacted by Obesity Treatment  Assessment & Plan Metabolic dysfunction-associated steatotic liver disease (MASLD) Detected on ultrasound in 2013.  Most recent liver enzymes are within normal limits.   Fibrosis 4 Score = 1.11 (Low risk)        Interpretation for patients with NAFLD          <1.30       -  F0-F1 (Low risk)          1.30-2.67 -  Indeterminate           >2.67      -  F3-F4 (High risk)     Validated for ages 59-65   No further work-up.  Continue diet low in saturated fats, avoidance of artificial sweeteners, reducing simple and added sugars in diet.  Continue GLP-1 for pharmacoprevention. Essential hypertension Blood pressure control has improved.  She is on amlodipine  10, metoprolol .  No side effects reported she is also now on tirzepatide  10 mg a day which will continue to have an effect on blood pressure. OSA on CPAP On CPAP with reported good compliance. Continue PAP therapy. Losing 15% or more of body weight may improve AHI.  Continue current weight management strategy inclusive of GLP-1 Class 2 severe obesity with serious comorbidity and body mass index (BMI) of 36.0 to 36.9 in adult, unspecified obesity type Weight: decrease of 39.8 lb (14.6%) over 2 years, 4 months  Start: 09/23/2021 272 lb 12.8 oz (123.7 kg)  End: 01/27/2024 233 lb (105.7 kg)  Obesity management is ongoing with a focus on lifestyle modifications and medication. She has lost four pounds over the holidays by portion control and increased physical activity. She is on a GLP-1 receptor agonist, which aids in weight loss and has additional benefits for comorbid conditions. She reports good appetite  suppression and no withdrawal symptoms after a temporary discontinuation for a colonoscopy. - Continue GLP-1 receptor agonist at 10 mg dose. - Provided a 90-day supply of medication to potentially reduce cost. - Encouraged continued lifestyle modifications including portion control and increased physical activity. Pure hypercholesterolemia Managed with Zetia  and simvastatin . There is a plan to evaluate the need for dual therapy and consider switching to rosuvastatin  for better efficacy. She has not taken simvastatin  for two months due to pharmacy issues but will resume soon. Discussed the possibility of microvascular coronary artery disease and the importance of cholesterol management. - Resume simvastatin  once available from [facility]. - Will order fasting cholesterol test after resuming simvastatin  and Zetia . - Will consider switching to rosuvastatin  if cholesterol remains elevated.         Objective   Physical Exam:  Blood pressure 126/78, pulse 71, temperature 98.1 F (36.7 C), height 5' 8 (1.727 m), weight 233 lb (105.7 kg), last menstrual period 04/01/2008, SpO2 100%. Body mass index is 35.43 kg/m.  General: She is overweight, cooperative, alert, well developed, and in no acute distress. PSYCH: Has normal mood, affect and thought process.   HEENT: EOMI, sclerae are anicteric. Lungs: Normal breathing effort, no conversational  dyspnea. Extremities: No edema.  Neurologic: No gross sensory or motor deficits. No tremors or fasciculations noted.    Diagnostic Data Reviewed:  BMET    Component Value Date/Time   NA 140 10/04/2023 0921   NA 137 02/22/2012 1443   K 4.4 10/04/2023 0921   K 3.6 09/19/2012 1011   CL 103 10/04/2023 0921   CL 101 02/22/2012 1443   CO2 20 10/04/2023 0921   CO2 27 02/22/2012 1443   GLUCOSE 83 10/04/2023 0921   GLUCOSE 106 (H) 01/11/2023 0836   GLUCOSE 102 (H) 02/22/2012 1443   BUN 16 10/04/2023 0921   BUN 13 02/22/2012 1443   CREATININE 0.93  10/04/2023 0921   CREATININE 1.09 11/02/2013 0903   CALCIUM  9.5 10/04/2023 0921   CALCIUM  9.0 02/22/2012 1443   GFRNONAA >60 01/11/2023 0836   GFRNONAA 59 (L) 11/02/2013 0903   GFRAA 68 07/11/2019 1143   GFRAA >60 11/02/2013 0903   Lab Results  Component Value Date   HGBA1C 5.7 (H) 08/24/2023   HGBA1C 5.8 (H) 07/11/2019   Lab Results  Component Value Date   INSULIN  19.6 10/21/2022   Lab Results  Component Value Date   TSH 1.040 10/04/2023   CBC    Component Value Date/Time   WBC 9.0 10/04/2023 0921   WBC 8.3 01/11/2023 0836   RBC 4.55 10/04/2023 0921   RBC 4.58 01/11/2023 0836   HGB 12.4 10/04/2023 0921   HCT 39.8 10/04/2023 0921   PLT 292 10/04/2023 0921   MCV 88 10/04/2023 0921   MCV 87 11/02/2013 0903   MCH 27.3 10/04/2023 0921   MCH 27.3 01/11/2023 0836   MCHC 31.2 (L) 10/04/2023 0921   MCHC 31.3 01/11/2023 0836   RDW 13.9 10/04/2023 0921   RDW 13.9 11/02/2013 0903   Iron Studies No results found for: IRON, TIBC, FERRITIN, IRONPCTSAT Lipid Panel     Component Value Date/Time   CHOL 259 (H) 08/24/2023 0928   TRIG 120 08/24/2023 0928   HDL 43 08/24/2023 0928   CHOLHDL 4.7 (H) 02/01/2023 1154   LDLCALC 194 (H) 08/24/2023 0928   Hepatic Function Panel     Component Value Date/Time   PROT 6.9 10/04/2023 0921   PROT 7.6 11/02/2013 0903   ALBUMIN 4.2 10/04/2023 0921   ALBUMIN 3.5 11/02/2013 0903   AST 18 10/04/2023 0921   AST 18 11/02/2013 0903   ALT 12 10/04/2023 0921   ALT 20 11/02/2013 0903   ALKPHOS 98 10/04/2023 0921   ALKPHOS 97 11/02/2013 0903   BILITOT 0.3 10/04/2023 0921   BILITOT 0.4 11/02/2013 0903   BILIDIR <0.1 (L) 11/06/2015 1528   IBILI NOT CALCULATED 11/06/2015 1528      Component Value Date/Time   TSH 1.040 10/04/2023 0921   Nutritional Lab Results  Component Value Date   VD25OH 28.0 (L) 08/24/2023   VD25OH 13.2 (L) 10/21/2022   VD25OH 20.0 (L) 07/01/2016    Medications: Outpatient Encounter Medications as of  01/27/2024  Medication Sig Note   apixaban  (ELIQUIS ) 5 MG TABS tablet Take 1 tablet (5 mg total) by mouth 2 (two) times daily.    B Complex-Biotin-FA (B-COMPLEX PO) Take 1 tablet by mouth daily.    Cholecalciferol (VITAMIN D3) 50 MCG (2000 UT) capsule Take 1 capsule (2,000 Units total) by mouth daily.    colchicine  0.6 MG tablet Take 1 tablet (0.6 mg total) by mouth 2 (two) times daily for 5 days.    ezetimibe  (ZETIA ) 10 MG tablet Take 1  tablet (10 mg total) by mouth daily.    famotidine  (PEPCID ) 40 MG tablet Take 1 tablet (40 mg total) by mouth daily.    furosemide  (LASIX ) 20 MG tablet Take 1 tablet (20 mg total) by mouth daily as needed.    ibuprofen  (ADVIL ) 800 MG tablet TAKE 1 TABLET BY MOUTH EVERY 8 HOURS AS NEEDED    levothyroxine  (SYNTHROID ) 75 MCG tablet Take 75 mcg by mouth daily.    LORazepam  (ATIVAN ) 0.5 MG tablet Take 1 tablet (0.5 mg total) by mouth every 8 (eight) hours as needed. for anxiety    methocarbamol  (ROBAXIN ) 500 MG tablet TAKE 1 TABLET BY MOUTH AT BEDTIME.    metoprolol  succinate (TOPROL -XL) 50 MG 24 hr tablet Take 1.5 tablets (75 mg total) by mouth 2 (two) times daily. Take with or immediately following a meal.    Multiple Vitamins-Minerals (MULTIVITAMIN WITH MINERALS) tablet Take 1 tablet by mouth daily.    nitroGLYCERIN  (NITROSTAT ) 0.4 MG SL tablet Place 1 tablet (0.4 mg total) under the tongue every 5 (five) minutes as needed for chest pain.    ondansetron  (ZOFRAN ) 4 MG tablet Take 1 tablet (4 mg total) by mouth every 8 (eight) hours as needed.    potassium chloride  SA (KLOR-CON  M) 20 MEQ tablet Take 1 tablet (20 mEq total) by mouth daily. 04/13/2023: PRN   simvastatin  (ZOCOR ) 10 MG tablet Take 1 tablet (10 mg total) by mouth at bedtime.    [DISCONTINUED] amLODipine  (NORVASC ) 10 MG tablet TAKE 1 TABLET BY MOUTH DAILY AS  NEEDED FOR CHEST PAIN    [DISCONTINUED] tirzepatide  (ZEPBOUND ) 10 MG/0.5ML Pen Inject 10 mg into the skin once a week.    tirzepatide  (ZEPBOUND ) 10  MG/0.5ML Pen Inject 10 mg into the skin once a week.    No facility-administered encounter medications on file as of 01/27/2024.     Follow-Up   Return in about 6 weeks (around 03/09/2024) for For Weight Mangement with Dr. Francyne, Fasting for labs.. She was informed of the importance of frequent follow up visits to maximize her success with intensive lifestyle modifications for her multiple health conditions.  Attestation Statement   Reviewed by clinician on day of visit: allergies, medications, problem list, medical history, surgical history, family history, social history, and previous encounter notes.     Lucas Francyne, MD

## 2024-01-28 NOTE — Telephone Encounter (Signed)
Please contact pt for future appointment. Pt overdue for 12 month f/u. 

## 2024-01-28 NOTE — Assessment & Plan Note (Signed)
 On CPAP with reported good compliance. Continue PAP therapy. Losing 15% or more of body weight may improve AHI.  Continue current weight management strategy inclusive of GLP-1

## 2024-01-28 NOTE — Assessment & Plan Note (Signed)
 Detected on ultrasound in 2013.  Most recent liver enzymes are within normal limits.   Fibrosis 4 Score = 1.11 (Low risk)        Interpretation for patients with NAFLD          <1.30       -  F0-F1 (Low risk)          1.30-2.67 -  Indeterminate           >2.67      -  F3-F4 (High risk)     Validated for ages 21-65   No further work-up.  Continue diet low in saturated fats, avoidance of artificial sweeteners, reducing simple and added sugars in diet.  Continue GLP-1 for pharmacoprevention.

## 2024-01-28 NOTE — Assessment & Plan Note (Signed)
 Blood pressure control has improved.  She is on amlodipine  10, metoprolol .  No side effects reported she is also now on tirzepatide  10 mg a day which will continue to have an effect on blood pressure.

## 2024-01-28 NOTE — Assessment & Plan Note (Signed)
 Weight: decrease of 39.8 lb (14.6%) over 2 years, 4 months  Start: 09/23/2021 272 lb 12.8 oz (123.7 kg)  End: 01/27/2024 233 lb (105.7 kg)  Obesity management is ongoing with a focus on lifestyle modifications and medication. She has lost four pounds over the holidays by portion control and increased physical activity. She is on a GLP-1 receptor agonist, which aids in weight loss and has additional benefits for comorbid conditions. She reports good appetite suppression and no withdrawal symptoms after a temporary discontinuation for a colonoscopy. - Continue GLP-1 receptor agonist at 10 mg dose. - Provided a 90-day supply of medication to potentially reduce cost. - Encouraged continued lifestyle modifications including portion control and increased physical activity.

## 2024-01-30 NOTE — Progress Notes (Unsigned)
 Cardiology Office Note  Date:  02/01/2024   ID:  Heather Dudley, DOB 15-Jun-1960, MRN [id number]  PCP:  Justus Leita VEAR, MD   Chief Complaint  Patient presents with   12 month follow up     Doing well.     HPI:  Heather Dudley is a 63 y.o. female  HTN Nonsmoker No diabetes breast cancer on the left, lumpectomy and lymph node dissection., chemotherapy 2015 remote chest pain Dx with OSA, on CPAP,  Atherosclerosis on CT Chronic chest pain Cardiac catheterization October 2023 no significant coronary disease afib and flutter ablation 01/12/22.  Angioedema on ACE inhibitors ablation in 2023 and a redo ablation in 2024.  Hyperlipidemia Presents for f/u of her chest pain, tachycardia, paroxysmal atrial fibrillation  Last seen by myself in clinic 10/24 Followed by EP, completed ablation 2024  Since then reports no significant tachyarrhythmia Active with no regular exercise program Denies significant chest pain or shortness of breath on exertion, no significant lower extremity edema  Rhythm relatively well-controlled on metoprolol  succinate 75 twice daily Remains on Eliquis  5 twice daily for stroke prevention  Started on Zetia  and simvastatin  for hyperlipidemia Total cholesterol 250 off medication  Denies chest pain concerning for angina Reports she is taking amlodipine  10 daily not as needed  On Lasix  daily as needed  EKG personally reviewed by myself on todays visit EKG Interpretation Date/Time:  Tuesday February 01 2024 11:51:51 EST Ventricular Rate:  78 PR Interval:  202 QRS Duration:  94 QT Interval:  378 QTC Calculation: 430 R Axis:   -20  Text Interpretation: Normal sinus rhythm When compared with ECG of 13-Apr-2023 11:22, No significant change was found Confirmed by Perla Lye [phone]) on 02/01/2024 12:11:59 PM    past medical history reviewed Eye itching on amlodipine , on further discussion today she is not clear of reaction Side effects  on lisinopril  Side effect on flecainide , felt scattered  Seen in emergency room January 24, 2020 Had discoordination, anxiety, atypical chest pain Night before had episode of atrial fibrillation  Echo April 2021 Left ventricular ejection fraction, by estimation, is 55 to 60%. The  left ventricle has normal function Left ventricular diastolic parameters  are consistent with Grade II diastolic dysfunction (pseudonormalization).  moderately elevated pulmonary artery systolic pressure.   Monitor reviewed avg HR of 79 bpm. Predominant underlying rhythm was Sinus Rhythm. Atrial Fibrillation occurred (2% burden), ranging from 73-175 bpm (avg of 115 bpm), the longest lasting 52 mins 28 secs with an avg rate of 118 bpm.  CT chest:  No significant coronary calcified atherosclerosis or aortic plaque  History of lumpectomy and lymph node dissection.  Sometimes this causes discomfort in her left arm  PMH:   has a past medical history of A-fib (HCC), Anemia, Back pain, Basal cell carcinoma of forehead, Blood in stool (05/13/2021), Breast cancer (HCC), Breast cyst, right (05/08/2020), Breast screening, unspecified, Cellulitis and abscess of trunk, Chest pain, Diastolic dysfunction, Edema of both lower extremities, Fatty liver, GERD (gastroesophageal reflux disease), Hernia, History of chemotherapy (2012), Hot flashes related to aromatase inhibitor therapy (09/08/2015), Hypertension, Hypothyroidism, Joint pain, Lump or mass in breast, Malignant neoplasm of breast (female), unspecified site, Malignant neoplasm of upper-outer quadrant of female breast (HCC), Neoplasm of uncertain behavior of connective and other soft tissue, Obesity, unspecified, PAF (paroxysmal atrial fibrillation) (HCC) (05/04/2017), Palpitations, Peroneal tendinitis, left (09/30/2020), Personal history of chemotherapy, Personal history of malignant neoplasm of breast, Personal history of radiation therapy (2013), Pre-diabetes, Screening for  obesity,  Sleep apnea, SOB (shortness of breath), and Thyroid  nodule.  PSH:    Past Surgical History:  Procedure Laterality Date   ABDOMINAL HYSTERECTOMY  2011   ovaries remain   ATRIAL FIBRILLATION ABLATION N/A 01/12/2022   Procedure: ATRIAL FIBRILLATION ABLATION;  Surgeon: Cindie Ole DASEN, MD;  Location: [facility];  Service: Cardiovascular;  Laterality: N/A;   ATRIAL FIBRILLATION ABLATION N/A 01/11/2023   Procedure: ATRIAL FIBRILLATION ABLATION;  Surgeon: Cindie Ole DASEN, MD;  Location: [facility];  Service: Cardiovascular;  Laterality: N/A;   birth mark removed     BREAST CYST ASPIRATION Right    negative 01/2010   BREAST EXCISIONAL BIOPSY  08/05/2010   left breast positive 07/2010   BREAST LUMPECTOMY  2012   left breast   CARDIAC CATHETERIZATION     COLONOSCOPY  07/29/2012   normal study.   COLONOSCOPY N/A 01/13/2024   Procedure: COLONOSCOPY;  Surgeon: Jinny Carmine, MD;  Location: [facility] ENDOSCOPY;  Service: Endoscopy;  Laterality: N/A;   COLONOSCOPY WITH PROPOFOL  N/A 12/10/2022   Procedure: COLONOSCOPY WITH PROPOFOL ;  Surgeon: Jinny Carmine, MD;  Location: [facility] ENDOSCOPY;  Service: Endoscopy;  Laterality: N/A;   HEMOSTASIS CLIP PLACEMENT  12/10/2022   Procedure: HEMOSTASIS CLIP PLACEMENT;  Surgeon: Jinny Carmine, MD;  Location: [facility] ENDOSCOPY;  Service: Endoscopy;;   HOT HEMOSTASIS  12/10/2022   Procedure: HOT HEMOSTASIS (ARGON PLASMA COAGULATION/BICAP);  Surgeon: Jinny Carmine, MD;  Location: [facility] ENDOSCOPY;  Service: Endoscopy;;   LEFT HEART CATH AND CORONARY ANGIOGRAPHY N/A 12/08/2021   Procedure: LEFT HEART CATH AND CORONARY ANGIOGRAPHY;  Surgeon: Darron Deatrice LABOR, MD;  Location: [facility] INVASIVE CV LAB;  Service: Cardiovascular;  Laterality: N/A;   POLYPECTOMY  12/10/2022   Procedure: POLYPECTOMY;  Surgeon: Jinny Carmine, MD;  Location: [facility] ENDOSCOPY;  Service: Endoscopy;;   robotic tonsillectomy Lingual tonsils Bilateral 06/08/2016   THYROID  SURGERY      partially removed   TUBAL LIGATION      Current Outpatient Medications  Medication Sig Dispense Refill   amLODipine  (NORVASC ) 10 MG tablet TAKE 1 TABLET BY MOUTH DAILY AS  NEEDED FOR CHEST PAIN 30 tablet 0   apixaban  (ELIQUIS ) 5 MG TABS tablet Take 1 tablet (5 mg total) by mouth 2 (two) times daily. 180 tablet 1   B Complex-Biotin-FA (B-COMPLEX PO) Take 1 tablet by mouth daily.     Cholecalciferol (VITAMIN D3) 50 MCG (2000 UT) capsule Take 1 capsule (2,000 Units total) by mouth daily.     colchicine  0.6 MG tablet Take 1 tablet (0.6 mg total) by mouth 2 (two) times daily for 5 days. 10 tablet 0   ezetimibe  (ZETIA ) 10 MG tablet Take 1 tablet (10 mg total) by mouth daily. 90 tablet 3   famotidine  (PEPCID ) 40 MG tablet Take 1 tablet (40 mg total) by mouth daily. 90 tablet 1   furosemide  (LASIX ) 20 MG tablet Take 1 tablet (20 mg total) by mouth daily as needed. 90 tablet 0   ibuprofen  (ADVIL ) 800 MG tablet TAKE 1 TABLET BY MOUTH EVERY 8 HOURS AS NEEDED 100 tablet 0   levothyroxine  (SYNTHROID ) 75 MCG tablet Take 75 mcg by mouth daily.     LORazepam  (ATIVAN ) 0.5 MG tablet Take 1 tablet (0.5 mg total) by mouth every 8 (eight) hours as needed. for anxiety 20 tablet 0   methocarbamol  (ROBAXIN ) 500 MG tablet TAKE 1 TABLET BY MOUTH AT BEDTIME. 30 tablet 0   metoprolol  succinate (TOPROL -XL) 50 MG 24 hr tablet Take 1.5 tablets (  75 mg total) by mouth 2 (two) times daily. Take with or immediately following a meal. 270 tablet 3   Multiple Vitamins-Minerals (MULTIVITAMIN WITH MINERALS) tablet Take 1 tablet by mouth daily.     nitroGLYCERIN  (NITROSTAT ) 0.4 MG SL tablet Place 1 tablet (0.4 mg total) under the tongue every 5 (five) minutes as needed for chest pain. 30 tablet 0   ondansetron  (ZOFRAN ) 4 MG tablet Take 1 tablet (4 mg total) by mouth every 8 (eight) hours as needed. 30 tablet 0   potassium chloride  SA (KLOR-CON  M) 20 MEQ tablet Take 1 tablet (20 mEq total) by mouth daily. 90 tablet 3   simvastatin   (ZOCOR ) 10 MG tablet Take 1 tablet (10 mg total) by mouth at bedtime. 90 tablet 0   tirzepatide  (ZEPBOUND ) 10 MG/0.5ML Pen Inject 10 mg into the skin once a week. 6 mL 0   No current facility-administered medications for this visit.    Allergies:   Ace inhibitors, Betamethasone dipropionate, and Penicillins   Social History:  The patient  reports that she has never smoked. She has never used smokeless tobacco. She reports current alcohol use. She reports that she does not use drugs.   Family History:   family history includes Cancer in her cousin, maternal uncle, mother, and paternal aunt; Colon cancer in an other family member; Heart disease in her father; Heart failure in her father; High blood pressure in her father and mother; Ovarian cancer in an other family member; Thyroid  disease in her mother.    Review of Systems: Review of Systems  Constitutional: Negative.   HENT: Negative.    Respiratory: Negative.    Cardiovascular: Negative.   Gastrointestinal: Negative.   Musculoskeletal: Negative.   Neurological: Negative.   Psychiatric/Behavioral: Negative.    All other systems reviewed and are negative.   PHYSICAL EXAM: VS:  BP 130/70 (BP Location: Left Arm, Patient Position: Sitting, Cuff Size: Normal)   Pulse 78   Ht 5' 7 (1.702 m)   Wt 240 lb (108.9 kg)   LMP 04/01/2008   SpO2 97%   BMI 37.59 kg/m  , BMI Body mass index is 37.59 kg/m. Constitutional:  oriented to person, place, and time. No distress.  HENT:  Head: Grossly normal Eyes:  no discharge. No scleral icterus.  Neck: No JVD, no carotid bruits  Cardiovascular: Regular rate and rhythm, no murmurs appreciated Pulmonary/Chest: Clear to auscultation bilaterally, no wheezes or rails Abdominal: Soft.  no distension.  no tenderness.  Musculoskeletal: Normal range of motion Neurological:  normal muscle tone. Coordination normal. No atrophy Skin: Skin warm and dry Psychiatric: normal affect, pleasant  Recent  Labs: 10/04/2023: ALT 12; BUN 16; Creatinine, Ser 0.93; Hemoglobin 12.4; Platelets 292; Potassium 4.4; Sodium 140; TSH 1.040    Lipid Panel Lab Results  Component Value Date   CHOL 259 (H) 08/24/2023   HDL 43 08/24/2023   LDLCALC 194 (H) 08/24/2023   TRIG 120 08/24/2023     Wt Readings from Last 3 Encounters:  02/01/24 240 lb (108.9 kg)  01/27/24 233 lb (105.7 kg)  01/19/24 238 lb 9.6 oz (108.2 kg)     ASSESSMENT AND PLAN:  Paroxysmal atrial fibrillation  ablation November 2023, recurrent atrial fibrillation  Repeat ablation 2024  continue metoprolol  succinate 75 twice daily, Eliquis  5 twice daily, rhythm well-controlled  Chest pain, unspecified type - CT scan chest reviewed no coronary calcification, no aortic atherosclerosis CT scan 2020 reviewed  no significant coronary calcification or aortic atherosclerosis Cardiac catheterization  2023 no significant coronary disease No further ischemic workup needed at this time  Hyperlipidemia, mixed Side effects on Lipitor, changed to Crestor  Now tolerating simvastatin  with Zetia  Without medication total cholesterol 250  Essential hypertension -  Blood pressure is well controlled on today's visit. No changes made to the medications.  Malignant neoplasm of left breast in female, estrogen receptor positive, unspecified site of breast [facility]) Previous chemotherapy and radiation on the left Lymphedema left arm Normal ejection fraction 2021   Orders Placed This Encounter  Procedures   EKG 12-Lead     Signed, Velinda Lunger, M.D., Ph.D. 02/01/2024  [facility] [place], [place] [phone]

## 2024-02-01 ENCOUNTER — Encounter: Payer: Self-pay | Admitting: Cardiovascular Disease

## 2024-02-01 ENCOUNTER — Ambulatory Visit: Attending: Cardiovascular Disease | Admitting: Cardiovascular Disease

## 2024-02-01 VITALS — BP 130/70 | HR 78 | Ht 67.0 in | Wt 240.0 lb

## 2024-02-01 DIAGNOSIS — G4733 Obstructive sleep apnea (adult) (pediatric): Secondary | ICD-10-CM

## 2024-02-01 DIAGNOSIS — I2510 Atherosclerotic heart disease of native coronary artery without angina pectoris: Secondary | ICD-10-CM

## 2024-02-01 DIAGNOSIS — I10 Essential (primary) hypertension: Secondary | ICD-10-CM

## 2024-02-01 DIAGNOSIS — I4819 Other persistent atrial fibrillation: Secondary | ICD-10-CM

## 2024-02-01 DIAGNOSIS — I25118 Atherosclerotic heart disease of native coronary artery with other forms of angina pectoris: Secondary | ICD-10-CM

## 2024-02-01 DIAGNOSIS — I5032 Chronic diastolic (congestive) heart failure: Secondary | ICD-10-CM

## 2024-02-01 DIAGNOSIS — I483 Typical atrial flutter: Secondary | ICD-10-CM

## 2024-02-01 DIAGNOSIS — I480 Paroxysmal atrial fibrillation: Secondary | ICD-10-CM

## 2024-02-01 MED ORDER — FUROSEMIDE 20 MG PO TABS: 20.0000 mg | ORAL_TABLET | Freq: Every day | ORAL | 0 refills | Status: AC | PRN

## 2024-02-01 MED ORDER — EZETIMIBE 10 MG PO TABS: 10.0000 mg | ORAL_TABLET | Freq: Every day | ORAL | 3 refills | Status: AC

## 2024-02-01 MED ORDER — METOPROLOL SUCCINATE ER 50 MG PO TB24: 75.0000 mg | ORAL_TABLET | Freq: Two times a day (BID) | ORAL | 3 refills | Status: AC

## 2024-02-01 MED ORDER — POTASSIUM CHLORIDE CRYS ER 20 MEQ PO TBCR: 20.0000 meq | EXTENDED_RELEASE_TABLET | Freq: Every day | ORAL | 3 refills | Status: AC

## 2024-02-01 MED ORDER — AMLODIPINE BESYLATE 10 MG PO TABS: 10.0000 mg | ORAL_TABLET | Freq: Every day | ORAL | 3 refills | Status: AC

## 2024-02-01 MED ORDER — SIMVASTATIN 10 MG PO TABS: 10.0000 mg | ORAL_TABLET | Freq: Every day | ORAL | 3 refills | Status: AC

## 2024-02-01 NOTE — Patient Instructions (Addendum)

## 2024-02-04 ENCOUNTER — Telehealth: Payer: Self-pay

## 2024-02-04 NOTE — Telephone Encounter (Signed)
 [employer] sent over a FAX stating that they have to have recent office visit treatment notes about Lymphedema for insurance to cover the sleeve.  Called patient and left VM informing her of this. She can either pay out of pocket for the sleeve, or she will need to call back and schedule a face to face visit for Lymphedema.  - [place]

## 2024-02-04 NOTE — Assessment & Plan Note (Signed)
 Persistent swelling of the left arm since mastectomy. Uses lymphedema sleeves regularly which improve her discomfort.

## 2024-03-12 ENCOUNTER — Other Ambulatory Visit: Payer: Self-pay | Admitting: Cardiovascular Disease

## 2024-03-12 DIAGNOSIS — I480 Paroxysmal atrial fibrillation: Secondary | ICD-10-CM

## 2024-03-13 ENCOUNTER — Ambulatory Visit (INDEPENDENT_AMBULATORY_CARE_PROVIDER_SITE_OTHER): Admitting: Internal Medicine

## 2024-03-13 ENCOUNTER — Encounter (INDEPENDENT_AMBULATORY_CARE_PROVIDER_SITE_OTHER): Payer: Self-pay | Admitting: Internal Medicine

## 2024-03-13 VITALS — BP 132/85 | HR 87 | Temp 98.4°F | Ht 68.0 in | Wt 231.0 lb

## 2024-03-13 DIAGNOSIS — E66812 Obesity, class 2: Secondary | ICD-10-CM

## 2024-03-13 DIAGNOSIS — I10 Essential (primary) hypertension: Secondary | ICD-10-CM

## 2024-03-13 DIAGNOSIS — Z6835 Body mass index (BMI) 35.0-35.9, adult: Secondary | ICD-10-CM | POA: Diagnosis not present

## 2024-03-13 DIAGNOSIS — E7800 Pure hypercholesterolemia, unspecified: Secondary | ICD-10-CM

## 2024-03-13 DIAGNOSIS — G4733 Obstructive sleep apnea (adult) (pediatric): Secondary | ICD-10-CM

## 2024-03-13 DIAGNOSIS — K760 Fatty (change of) liver, not elsewhere classified: Secondary | ICD-10-CM

## 2024-03-13 DIAGNOSIS — R7303 Prediabetes: Secondary | ICD-10-CM | POA: Diagnosis not present

## 2024-03-13 NOTE — Assessment & Plan Note (Signed)
 On CPAP with reported good compliance. Continue PAP therapy. Losing 15% or more of body weight may improve AHI.  Continue current weight management strategy inclusive of GLP-1

## 2024-03-13 NOTE — Assessment & Plan Note (Signed)
 Vitals:   03/13/24 0800  BP: 132/85   Stable. Blood pressure control expected to improve with continued weight reduction. No medication changes indicated today; continue current antihypertensive regimen and home BP monitoring.

## 2024-03-13 NOTE — Progress Notes (Signed)
 "  Office: [phone]  /  Fax: [phone]  Weight Summary and Body Composition Analysis (BIA)  Vitals Temp: 98.4 F (36.9 C) BP: 132/85 Pulse Rate: 87 SpO2: 99 %   Anthropometric Measurements Height: 5' 8 (1.727 m) Weight: 231 lb (104.8 kg) BMI (Calculated): 35.13 Weight at Last Visit: 237 lb Weight Lost Since Last Visit: 2 lb Weight Gained Since Last Visit: 0 lb Starting Weight: 263 lb Total Weight Loss (lbs): 32 lb (14.5 kg) Peak Weight: 264 lb   Body Composition  Body Fat %: 37.3 % Fat Mass (lbs): 86.2 lbs Muscle Mass (lbs): 137.6 lbs Total Body Water (lbs): 84.2 lbs Visceral Fat Rating : 11    RMR: [id number]  Today's Visit #: 14  Starting Date: 10/01/22   Subjective   Chief Complaint: Obesity  Interval History  Discussed the use of AI scribe software for clinical note transcription with the patient, who gave verbal consent to proceed.  History of Present Illness Heather Dudley is a 64 year old female with MASLD who presents for medical weight management.  She has lost two pounds since her last visit and is on a 1500 calorie diet plan. She adheres fairly to her diet and exercise regimen, engaging in cardio for 30 minutes twice a week. She takes a 10 mg dose of a medication for appetite suppression but feels it could be more effective, although she has enough control over her appetite.   She uses a CPAP machine for obstructive sleep apnea but experienced a brief interruption in use due to the machine falling and needing to dry out, missing it for two to three days. She is considering alternative treatments but is hesitant about invasive options.  She has a history of metabolic associated steatotic liver disease diagnosed via ultrasound in 2013. She has hypertension and hypercholesterolemia, for which she is on medication. She is due for blood work to check her cholesterol, A1c, and insulin  levels. She is considering changing her cholesterol medication  to a single pill regimen.  She reports arthritis in her back, specifically between L5 and L6, and occasional knee pain, particularly when climbing stairs. She attributes the back pain to past epidurals. She is working on increasing her physical activity, aiming for an hour of walking on some days.     Challenges affecting patient progress: recent lapse in weight management plan due to work, travel, illness or family circumstances.  Low volume physical activity at present time chronic arthritis   Pharmacotherapy for weight management: She is currently taking Zepbound  with adequate clinical response  and without side effects..   Assessment and Plan   Treatment Plan For Obesity:  Recommended Dietary Goals  Heather Dudley is currently in the action stage of change. As such, her goal is to continue weight management plan. She has agreed to: continue current plan  Behavioral Health and Counseling  We discussed the following behavioral modification strategies today: continue to work on maintaining a reduced calorie state, getting the recommended amount of protein, incorporating whole foods, making healthy choices, staying well hydrated and practicing mindfulness when eating. and increase protein intake, fibrous foods (25 grams per day for women, 30 grams for men) and water to improve satiety and decrease hunger signals. .  Additional education and resources provided today: None  Recommended Physical Activity Goals  Heather Dudley has been advised to work up to 150 minutes of moderate intensity aerobic activity a week and strengthening exercises 2-3 times per week for cardiovascular health, weight loss maintenance and  preservation of muscle mass.  She has agreed to :  Increase volume of physical activity to a goal of 240 minutes a week and Combine aerobic and strengthening exercises for efficiency and improved cardiometabolic health.  Medical Interventions and Pharmacotherapy  We discussed various  medication options to help Heather Dudley with her weight loss efforts and we both agreed to : Adequate clinical response to anti-obesity medication, continue current anti-obesity regimen.  Patient is not interested in increasing medication has adequate satiety and satiation  Associated Conditions Impacted by Obesity Treatment  Assessment & Plan Essential hypertension Vitals:   03/13/24 0800  BP: 132/85   Stable. Blood pressure control expected to improve with continued weight reduction. No medication changes indicated today; continue current antihypertensive regimen and home BP monitoring.   OSA on CPAP On CPAP with reported good compliance. Continue PAP therapy. Losing 15% or more of body weight may improve AHI.  Continue current weight management strategy inclusive of GLP-1 Metabolic dysfunction-associated steatotic liver disease (MASLD) Detected on ultrasound in 2013.  Most recent liver enzymes are within normal limits.   Fibrosis 4 Score = 1.11 (Low risk)        Interpretation for patients with NAFLD          <1.30       -  F0-F1 (Low risk)          1.30-2.67 -  Indeterminate           >2.67      -  F3-F4 (High risk)     Validated for ages 36-65   No further work-up.  Continue diet low in saturated fats, avoidance of artificial sweeteners, reducing simple and added sugars in diet.  Continue GLP-1 for pharmacoprevention. Class 2 severe obesity with serious comorbidity and body mass index (BMI) of 35.0 to 35.9 in adult, unspecified obesity type Weight: decrease of 32 lb (12.2%) over 1 year, 5 months  Start: 09/21/2022 263 lb (119.3 kg)  End: 03/13/2024 231 lb (104.8 kg)  She has lost 41 pounds over two years, approximately 15% of her highest weight, with a recent loss of 2 pounds. She is on a 1500 calorie diet with fair adherence and engages in physical activity twice a week. She is on a 10 mg dose of medication for appetite suppression, which she feels is sufficient. She is advised to increase  physical activity to prevent weight loss plateau and maintain weight loss. Emphasized the importance of exercise as a form of medicine to maintain health and prevent future health problems. - Continue 1500 calorie diet - Increase physical activity to at least 30 minutes of walking five days a week goal volume of 240 minutes combined - Consider using a weighted vest for walking - Continue Zepbound  at current dose patient is not interested in increasing medication notes adequate satiety and satiation. Prediabetes Lab Results  Component Value Date   HGBA1C 5.7 (H) 08/24/2023   HGBA1C 6.0 (H) 02/01/2023   HGBA1C 6.2 (H) 09/30/2022   Stable at this time. Glycemic control is being addressed through the weight management plan above, with expected improvement in insulin  resistance and metabolic parameters as weight loss progresses. Will continue to monitor A1c and glucose trends.   Pure hypercholesterolemia Cholesterol management is ongoing. She is advised to have her cholesterol levels checked, and there is a plan to discuss potential changes in cholesterol medication based on results. Discussed the importance of checking for sticky cholesterol and inflammation markers to assess heart attack risk. - Ordered  cholesterol test - Ordered lipoprotein A test - Ordered high sensitivity CRP test - Will discuss potential changes in cholesterol medication based on test results    Orders Placed This Encounter  Procedures   CMP14+EGFR    Release to patient:   Immediate   Hemoglobin A1c    Release to patient:   Immediate   Insulin , random    Release to patient:   Immediate   Lipid Panel With LDL/HDL Ratio    Release to patient:   Immediate   Lipoprotein A (LPA)    Release to patient:   Immediate [1]   High sensitivity CRP    Release to patient:   Immediate [1]         Objective   Physical Exam:  Blood pressure 132/85, pulse 87, temperature 98.4 F (36.9 C), height 5' 8 (1.727 m), weight  231 lb (104.8 kg), last menstrual period 04/01/2008, SpO2 99%. Body mass index is 35.12 kg/m.  General: She is overweight, cooperative, alert, well developed, and in no acute distress. PSYCH: Has normal mood, affect and thought process.   HEENT: EOMI, sclerae are anicteric. Lungs: Normal breathing effort, no conversational dyspnea. Extremities: No edema.  Neurologic: No gross sensory or motor deficits. No tremors or fasciculations noted.    Diagnostic Data Reviewed:  BMET    Component Value Date/Time   NA 140 10/04/2023 0921   NA 137 02/22/2012 1443   K 4.4 10/04/2023 0921   K 3.6 09/19/2012 1011   CL 103 10/04/2023 0921   CL 101 02/22/2012 1443   CO2 20 10/04/2023 0921   CO2 27 02/22/2012 1443   GLUCOSE 83 10/04/2023 0921   GLUCOSE 106 (H) 01/11/2023 0836   GLUCOSE 102 (H) 02/22/2012 1443   BUN 16 10/04/2023 0921   BUN 13 02/22/2012 1443   CREATININE 0.93 10/04/2023 0921   CREATININE 1.09 11/02/2013 0903   CALCIUM  9.5 10/04/2023 0921   CALCIUM  9.0 02/22/2012 1443   GFRNONAA >60 01/11/2023 0836   GFRNONAA 59 (L) 11/02/2013 0903   GFRAA 68 07/11/2019 1143   GFRAA >60 11/02/2013 0903   Lab Results  Component Value Date   HGBA1C 5.7 (H) 08/24/2023   HGBA1C 5.8 (H) 07/11/2019   Lab Results  Component Value Date   INSULIN  19.6 10/21/2022   Lab Results  Component Value Date   TSH 1.040 10/04/2023   CBC    Component Value Date/Time   WBC 9.0 10/04/2023 0921   WBC 8.3 01/11/2023 0836   RBC 4.55 10/04/2023 0921   RBC 4.58 01/11/2023 0836   HGB 12.4 10/04/2023 0921   HCT 39.8 10/04/2023 0921   PLT 292 10/04/2023 0921   MCV 88 10/04/2023 0921   MCV 87 11/02/2013 0903   MCH 27.3 10/04/2023 0921   MCH 27.3 01/11/2023 0836   MCHC 31.2 (L) 10/04/2023 0921   MCHC 31.3 01/11/2023 0836   RDW 13.9 10/04/2023 0921   RDW 13.9 11/02/2013 0903   Iron Studies No results found for: IRON, TIBC, FERRITIN, IRONPCTSAT Lipid Panel     Component Value Date/Time    CHOL 259 (H) 08/24/2023 0928   TRIG 120 08/24/2023 0928   HDL 43 08/24/2023 0928   CHOLHDL 4.7 (H) 02/01/2023 1154   LDLCALC 194 (H) 08/24/2023 0928   Hepatic Function Panel     Component Value Date/Time   PROT 6.9 10/04/2023 0921   PROT 7.6 11/02/2013 0903   ALBUMIN 4.2 10/04/2023 0921   ALBUMIN 3.5 11/02/2013 0903   AST  18 10/04/2023 0921   AST 18 11/02/2013 0903   ALT 12 10/04/2023 0921   ALT 20 11/02/2013 0903   ALKPHOS 98 10/04/2023 0921   ALKPHOS 97 11/02/2013 0903   BILITOT 0.3 10/04/2023 0921   BILITOT 0.4 11/02/2013 0903   BILIDIR <0.1 (L) 11/06/2015 1528   IBILI NOT CALCULATED 11/06/2015 1528      Component Value Date/Time   TSH 1.040 10/04/2023 0921   Nutritional Lab Results  Component Value Date   VD25OH 28.0 (L) 08/24/2023   VD25OH 13.2 (L) 10/21/2022   VD25OH 20.0 (L) 07/01/2016    Medications: Outpatient Encounter Medications as of 03/13/2024  Medication Sig   amLODipine  (NORVASC ) 10 MG tablet Take 1 tablet (10 mg total) by mouth daily.   apixaban  (ELIQUIS ) 5 MG TABS tablet Take 1 tablet (5 mg total) by mouth 2 (two) times daily.   B Complex-Biotin-FA (B-COMPLEX PO) Take 1 tablet by mouth daily.   Cholecalciferol (VITAMIN D3) 50 MCG (2000 UT) capsule Take 1 capsule (2,000 Units total) by mouth daily.   colchicine  0.6 MG tablet Take 1 tablet (0.6 mg total) by mouth 2 (two) times daily for 5 days.   ezetimibe  (ZETIA ) 10 MG tablet Take 1 tablet (10 mg total) by mouth daily.   famotidine  (PEPCID ) 40 MG tablet Take 1 tablet (40 mg total) by mouth daily.   furosemide  (LASIX ) 20 MG tablet Take 1 tablet (20 mg total) by mouth daily as needed.   ibuprofen  (ADVIL ) 800 MG tablet TAKE 1 TABLET BY MOUTH EVERY 8 HOURS AS NEEDED   levothyroxine  (SYNTHROID ) 75 MCG tablet Take 75 mcg by mouth daily.   LORazepam  (ATIVAN ) 0.5 MG tablet Take 1 tablet (0.5 mg total) by mouth every 8 (eight) hours as needed. for anxiety   methocarbamol  (ROBAXIN ) 500 MG tablet TAKE 1 TABLET  BY MOUTH AT BEDTIME.   metoprolol  succinate (TOPROL -XL) 50 MG 24 hr tablet Take 1.5 tablets (75 mg total) by mouth 2 (two) times daily. Take with or immediately following a meal.   Multiple Vitamins-Minerals (MULTIVITAMIN WITH MINERALS) tablet Take 1 tablet by mouth daily.   nitroGLYCERIN  (NITROSTAT ) 0.4 MG SL tablet Place 1 tablet (0.4 mg total) under the tongue every 5 (five) minutes as needed for chest pain.   ondansetron  (ZOFRAN ) 4 MG tablet Take 1 tablet (4 mg total) by mouth every 8 (eight) hours as needed.   potassium chloride  SA (KLOR-CON  M) 20 MEQ tablet Take 1 tablet (20 mEq total) by mouth daily.   simvastatin  (ZOCOR ) 10 MG tablet Take 1 tablet (10 mg total) by mouth at bedtime.   tirzepatide  (ZEPBOUND ) 10 MG/0.5ML Pen Inject 10 mg into the skin once a week.   No facility-administered encounter medications on file as of 03/13/2024.     Follow-Up   No follow-ups on file.SABRA She was informed of the importance of frequent follow up visits to maximize her success with intensive lifestyle modifications for her multiple health conditions.  Attestation Statement   Reviewed by clinician on day of visit: allergies, medications, problem list, medical history, surgical history, family history, social history, and previous encounter notes.     Lucas Parker, MD  "

## 2024-03-13 NOTE — Assessment & Plan Note (Signed)
 Detected on ultrasound in 2013.  Most recent liver enzymes are within normal limits.   Fibrosis 4 Score = 1.11 (Low risk)        Interpretation for patients with NAFLD          <1.30       -  F0-F1 (Low risk)          1.30-2.67 -  Indeterminate           >2.67      -  F3-F4 (High risk)     Validated for ages 21-65   No further work-up.  Continue diet low in saturated fats, avoidance of artificial sweeteners, reducing simple and added sugars in diet.  Continue GLP-1 for pharmacoprevention.

## 2024-03-13 NOTE — Assessment & Plan Note (Addendum)
 Weight: decrease of 32 lb (12.2%) over 1 year, 5 months  Start: 09/21/2022 263 lb (119.3 kg)  End: 03/13/2024 231 lb (104.8 kg)  She has lost 41 pounds over two years, approximately 15% of her highest weight, with a recent loss of 2 pounds. She is on a 1500 calorie diet with fair adherence and engages in physical activity twice a week. She is on a 10 mg dose of medication for appetite suppression, which she feels is sufficient. She is advised to increase physical activity to prevent weight loss plateau and maintain weight loss. Emphasized the importance of exercise as a form of medicine to maintain health and prevent future health problems. - Continue 1500 calorie diet - Increase physical activity to at least 30 minutes of walking five days a week goal volume of 240 minutes combined - Consider using a weighted vest for walking - Continue Zepbound  at current dose patient is not interested in increasing medication notes adequate satiety and satiation.

## 2024-03-13 NOTE — Assessment & Plan Note (Signed)
 Lab Results  Component Value Date   HGBA1C 5.7 (H) 08/24/2023   HGBA1C 6.0 (H) 02/01/2023   HGBA1C 6.2 (H) 09/30/2022   Stable at this time. Glycemic control is being addressed through the weight management plan above, with expected improvement in insulin  resistance and metabolic parameters as weight loss progresses. Will continue to monitor A1c and glucose trends.

## 2024-03-14 LAB — HEMOGLOBIN A1C
Est. average glucose Bld gHb Est-mCnc: 111 mg/dL
Hgb A1c MFr Bld: 5.5 % (ref 4.8–5.6)

## 2024-03-14 LAB — CMP14+EGFR
ALT: 8 IU/L (ref 0–32)
AST: 15 IU/L (ref 0–40)
Albumin: 4.3 g/dL (ref 3.9–4.9)
Alkaline Phosphatase: 98 IU/L (ref 49–135)
BUN/Creatinine Ratio: 12 (ref 12–28)
BUN: 11 mg/dL (ref 8–27)
Bilirubin Total: 0.2 mg/dL (ref 0.0–1.2)
CO2: 21 mmol/L (ref 20–29)
Calcium: 9.3 mg/dL (ref 8.7–10.3)
Chloride: 101 mmol/L (ref 96–106)
Creatinine, Ser: 0.92 mg/dL (ref 0.57–1.00)
Globulin, Total: 2.5 g/dL (ref 1.5–4.5)
Glucose: 77 mg/dL (ref 70–99)
Potassium: 4.2 mmol/L (ref 3.5–5.2)
Sodium: 138 mmol/L (ref 134–144)
Total Protein: 6.8 g/dL (ref 6.0–8.5)
eGFR: 71 mL/min/1.73

## 2024-03-14 LAB — LIPID PANEL WITH LDL/HDL RATIO
Cholesterol, Total: 150 mg/dL (ref 100–199)
HDL: 47 mg/dL
LDL Chol Calc (NIH): 82 mg/dL (ref 0–99)
LDL/HDL Ratio: 1.7 ratio (ref 0.0–3.2)
Triglycerides: 116 mg/dL (ref 0–149)
VLDL Cholesterol Cal: 21 mg/dL (ref 5–40)

## 2024-03-14 LAB — HIGH SENSITIVITY CRP: CRP, High Sensitivity: 7.31 mg/L — ABNORMAL HIGH (ref 0.00–3.00)

## 2024-03-14 LAB — INSULIN, RANDOM: INSULIN: 44.9 u[IU]/mL — ABNORMAL HIGH (ref 2.6–24.9)

## 2024-03-14 LAB — LIPOPROTEIN A (LPA): Lipoprotein (a): 124.6 nmol/L — ABNORMAL HIGH

## 2024-03-14 NOTE — Telephone Encounter (Signed)
 Eliquis  5mg  refill request received. Patient is 64 years old, weight-104.8kg, Crea-0.92 on 03/13/24, Diagnosis-Afib, and last seen by Dr. Gollan on 02/01/24. Dose is appropriate based on dosing criteria. Will send in refill to requested pharmacy.

## 2024-03-15 ENCOUNTER — Ambulatory Visit (INDEPENDENT_AMBULATORY_CARE_PROVIDER_SITE_OTHER): Payer: Self-pay | Admitting: Internal Medicine

## 2024-04-05 ENCOUNTER — Ambulatory Visit: Admitting: Student

## 2024-04-10 ENCOUNTER — Ambulatory Visit (INDEPENDENT_AMBULATORY_CARE_PROVIDER_SITE_OTHER): Admitting: Internal Medicine
